# Patient Record
Sex: Female | Born: 1965 | Race: White | Hispanic: No | Marital: Married | Smoking: Never smoker
Health system: Southern US, Community
[De-identification: ages and names within clinical notes are randomized; demographics above are authoritative.]

## PROBLEM LIST (undated history)

## (undated) DIAGNOSIS — M7501 Adhesive capsulitis of right shoulder: Secondary | ICD-10-CM

## (undated) DIAGNOSIS — T7840XA Allergy, unspecified, initial encounter: Secondary | ICD-10-CM

## (undated) DIAGNOSIS — Z973 Presence of spectacles and contact lenses: Secondary | ICD-10-CM

## (undated) DIAGNOSIS — J302 Other seasonal allergic rhinitis: Secondary | ICD-10-CM

## (undated) DIAGNOSIS — F419 Anxiety disorder, unspecified: Secondary | ICD-10-CM

## (undated) DIAGNOSIS — D649 Anemia, unspecified: Secondary | ICD-10-CM

## (undated) DIAGNOSIS — F329 Major depressive disorder, single episode, unspecified: Secondary | ICD-10-CM

## (undated) HISTORY — PX: BREAST BIOPSY: SHX20

## (undated) HISTORY — PX: TUBAL LIGATION: SHX77

## (undated) HISTORY — DX: Allergy, unspecified, initial encounter: T78.40XA

## (undated) HISTORY — PX: BACK SURGERY: SHX140

---

## 2001-09-06 ENCOUNTER — Other Ambulatory Visit: Admission: RE | Admit: 2001-09-06 | Discharge: 2001-09-06 | Payer: Self-pay | Admitting: Obstetrics and Gynecology

## 2001-10-31 ENCOUNTER — Inpatient Hospital Stay (HOSPITAL_COMMUNITY): Admission: AD | Admit: 2001-10-31 | Discharge: 2001-10-31 | Payer: Self-pay | Admitting: Obstetrics and Gynecology

## 2002-02-01 ENCOUNTER — Inpatient Hospital Stay (HOSPITAL_COMMUNITY): Admission: AD | Admit: 2002-02-01 | Discharge: 2002-02-01 | Payer: Self-pay | Admitting: Obstetrics and Gynecology

## 2002-02-07 ENCOUNTER — Inpatient Hospital Stay (HOSPITAL_COMMUNITY): Admission: AD | Admit: 2002-02-07 | Discharge: 2002-02-07 | Payer: Self-pay | Admitting: Obstetrics and Gynecology

## 2002-02-13 ENCOUNTER — Inpatient Hospital Stay (HOSPITAL_COMMUNITY): Admission: AD | Admit: 2002-02-13 | Discharge: 2002-02-13 | Payer: Self-pay | Admitting: *Deleted

## 2002-02-27 ENCOUNTER — Encounter (INDEPENDENT_AMBULATORY_CARE_PROVIDER_SITE_OTHER): Payer: Self-pay

## 2002-02-27 ENCOUNTER — Inpatient Hospital Stay (HOSPITAL_COMMUNITY): Admission: AD | Admit: 2002-02-27 | Discharge: 2002-03-04 | Payer: Self-pay | Admitting: *Deleted

## 2002-03-05 ENCOUNTER — Encounter: Admission: RE | Admit: 2002-03-05 | Discharge: 2002-04-04 | Payer: Self-pay | Admitting: *Deleted

## 2002-04-05 ENCOUNTER — Encounter: Admission: RE | Admit: 2002-04-05 | Discharge: 2002-05-05 | Payer: Self-pay | Admitting: *Deleted

## 2002-04-24 ENCOUNTER — Other Ambulatory Visit: Admission: RE | Admit: 2002-04-24 | Discharge: 2002-04-24 | Payer: Self-pay | Admitting: Obstetrics and Gynecology

## 2002-05-06 ENCOUNTER — Encounter: Admission: RE | Admit: 2002-05-06 | Discharge: 2002-06-05 | Payer: Self-pay | Admitting: *Deleted

## 2003-05-27 ENCOUNTER — Other Ambulatory Visit: Admission: RE | Admit: 2003-05-27 | Discharge: 2003-05-27 | Payer: Self-pay | Admitting: Obstetrics and Gynecology

## 2004-07-31 ENCOUNTER — Other Ambulatory Visit: Admission: RE | Admit: 2004-07-31 | Discharge: 2004-07-31 | Payer: Self-pay | Admitting: Obstetrics and Gynecology

## 2005-10-04 ENCOUNTER — Other Ambulatory Visit: Admission: RE | Admit: 2005-10-04 | Discharge: 2005-10-04 | Payer: Self-pay | Admitting: Obstetrics and Gynecology

## 2006-12-21 ENCOUNTER — Encounter: Admission: RE | Admit: 2006-12-21 | Discharge: 2006-12-21 | Payer: Self-pay | Admitting: Obstetrics and Gynecology

## 2007-01-03 ENCOUNTER — Encounter: Admission: RE | Admit: 2007-01-03 | Discharge: 2007-01-03 | Payer: Self-pay | Admitting: Obstetrics and Gynecology

## 2007-01-05 ENCOUNTER — Encounter: Admission: RE | Admit: 2007-01-05 | Discharge: 2007-01-05 | Payer: Self-pay | Admitting: Obstetrics and Gynecology

## 2007-01-13 ENCOUNTER — Encounter: Admission: RE | Admit: 2007-01-13 | Discharge: 2007-01-13 | Payer: Self-pay | Admitting: Obstetrics and Gynecology

## 2007-01-13 ENCOUNTER — Encounter (INDEPENDENT_AMBULATORY_CARE_PROVIDER_SITE_OTHER): Payer: Self-pay | Admitting: Diagnostic Radiology

## 2007-08-01 ENCOUNTER — Encounter: Admission: RE | Admit: 2007-08-01 | Discharge: 2007-08-01 | Payer: Self-pay | Admitting: Obstetrics and Gynecology

## 2008-08-30 HISTORY — PX: BREAST BIOPSY: SHX20

## 2010-09-20 ENCOUNTER — Encounter: Payer: Self-pay | Admitting: Obstetrics and Gynecology

## 2011-01-15 NOTE — Op Note (Signed)
[facility]  Patient:    Lori King, Lori King Visit Number: [id number] MRN: [id number]          Service Type: OBS Location: 910A 9103 01 Attending Physician:  Donne Hazel Dictated by:   Marcelle Overlie, M.D. Proc. Date: 02/28/02 Admit Date:  02/27/2002                             Operative Report  PREOPERATIVE DIAGNOSES:      1. Intrauterine pregnancy at 34-5/7 weeks.                               2. Preterm labor.                               3. Previous cesarean section.                               4. Possible uterine scar separation.                               5. Desires permanent sterilization.  POSTOPERATIVE DIAGNOSES:      1. Intrauterine pregnancy at 34-5/7 weeks.                               2. Preterm labor.                               3. Previous cesarean section.                               4. Possible uterine scar separation.                               5. Desires permanent sterilization.  PROCEDURES:                   1. Repeat cesarean section.                               2. Bilateral tubal ligation.  SURGEON:                      Marcelle Overlie, M.D.  ANESTHESIA:                   Spinal.  ESTIMATED BLOOD LOSS:         500 cc.  FINDINGS:                     Female infant in cephalic presentation.  Apgars 7 at one minute and 8 at five minutes.  DESCRIPTION OF PROCEDURE:     The patient was taken to the operating room. Spinal was placed and found to be adequate.  She was placed in the dorsal supine position with a leftward tilt.  The abdomen and vulva were prepped and draped in the usual sterile fashion after a Foley catheter was used  and inserted.  Using a scalpel, a low transverse incision was made in the area of the previous cesarean section and carried down to the fascia.  The fascia was scored in the midline and extended laterally.  A Pfannenstiel incision was then developed.  The rectus muscles  were separated.  The peritoneum was entered bluntly.  The peritoneal incision was then stretched.  A bladder blade was inserted and the lower uterine segment was identified.  The lower uterine segment was very attenuated and very thin, but there was no obvious separation seen.  The uterine scar was easily pierced using a hemostat.  The uterus was then separated.  The incision was then separated.  Amniotic fluid was noted to be clear.  The baby was in cephalic presentation and was delivered with the assistance of a vacuum extractor.  She was a female infant with Apgars of 7 at one minute and 8 at five minutes.  The baby was handed to the waiting neonatologist after the cord was clamped and cut.  Cord blood was obtained. The uterus was then cleared of all clots and debris after the placenta was manually removed.  The uterus was then exteriorized.  The uterine incision was closed in a single layer using 0 chromic in a continuous running lock stitch and was hemostatic.  Attention was then turned to the tubes.  The bilateral fimbriated ends were identified.  A modified bilateral tubal ligation was performed in the following fashion.  A Babcock clamp was placed in the midportion of each tube.  A 3 cm knuckle was tied off using plain gut suture x2.  The knuckle was excised using Metzenbaum scissors.  The incisions were all inspected and noted to be hemostatic.  The peritoneum was closed using 0 Vicryl in continuous running stitch.  The rectus muscles were reapproximated using a single 0 Vicryl.  The fascia was closed using 0 Vicryl in continuous running stitch starting at each corner and meeting in the midline.  After inspection of the subcutaneous layer and noting hemostasis, the skin was closed with staples.  All sponge, lap and instrument counts were correct x2. The patient tolerated the procedure well and went to the recovery room in stable condition. Dictated by:   Marcelle Overlie,  M.D. Attending Physician:  Donne Hazel DD:  02/28/02 TD:  03/04/02 Job: [id number]

## 2011-01-15 NOTE — Discharge Summary (Signed)
NAME:  Lori King, KNAPKE                       ACCOUNT NO.:  [id number]   MEDICAL RECORD NO.:  [id number]                   PATIENT TYPE:  INP   LOCATION:  9103                                 FACILITY:  [facility]   PHYSICIAN:  Juluis Mire, M.D.                DATE OF BIRTH:  07-31-1966   DATE OF ADMISSION:  02/27/2002  DATE OF DISCHARGE:  03/04/2002                                 DISCHARGE SUMMARY   ADMISSION DIAGNOSES:  1. Intrauterine pregnancy at 34-5/7 weeks.  2. Preterm labor.  3. Previous cesarean delivery.  4. Possible uterine scar separation.  5. Desires permanent sterilization.   DISCHARGE DIAGNOSES:  1. Status post low transverse cesarean section.  2. Viable female infant.  3. Permanent sterilization.   PROCEDURE:  1. Repeat low transverse cesarean section.  2. Bilateral tubal ligation.   REASON FOR ADMISSION:  Please see written H&P.   HOSPITAL COURSE:  The patient was admitted to [facility] at  34-4/7 weeks, estimated gestational age, for tocolysis for preterm labor.  The patient had previously been treated with oral terbutaline.  The patient  presented to the hospital with increased contractions, low back pain, and  pressure.  The patient was admitted for magnesium sulfate, tocolysis, and  betamethasone.  The patient had history of positive group B strep.  Antibiotics were administered.  Contractions continued despite magnesium  sulfate at 3.5 g/hr.  The patient was very uncomfortable and concern was  that patient had developed a separation of a previous cesarean scar.  It was  decided to proceed to the operating room for a repeat low transverse  cesarean delivery.  Spinal anesthesia was administered without difficulty.  A low transverse incision was made with the delivery of a viable female  infant, weighing 5 pounds 8.2 ounces with Apgars of 7 at one minute and 8 at  five minutes.  The baby was taken to the NICU by the pediatric team.  Bilateral tubal ligation was performed.  The patient tolerated the procedure  well and was taken to the recovery room in stable condition.  On  postoperative day one, the patient had good return of bowel function.  Foley  had been discontinued and patient was voiding without difficulty.  Abdominal  dressing was noted to be clean, dry, and intact.  Labs were pending.  On  postoperative day two, abdomen was soft.  Incision was clean, dry, and  intact.  The patient was tolerating a regular diet without complaints of  nausea and vomiting.  Labs revealed a hemoglobin of 9.4, hematocrit of 38.2,  WBC count of 14.3, and platelet count of 265,000.  On postoperative day  three, abdomen continued to be soft.  The patient was ambulating without  difficulty.  On postoperative day four, patient was doing well.  Incision  was clean, dry, and intact.  Staples were intact.  The patient was  discharged home.  CONDITION ON DISCHARGE:  Good.   DIET:  Regular as tolerated.   ACTIVITY:  No heavy lifting, no vaginal entry, no driving x 2 weeks.   FOLLOW UP:  The patient is to follow up in the office in three days for  staple removal.  Then she is to return to the office in 1-2 weeks for an  incision check.  She is to call for temperature greater than 100 degrees,  persistent nausea and vomiting, heavy vaginal bleeding, and/or redness or  drainage of the incision site.   DISCHARGE MEDICATIONS:  1. Tylox #30, 1-2 q.4-6h. p.r.n. pain.  2. Motrin 600 mg q.6h. p.r.n.  3. Chromagen 1 p.o. q.d.  4. Prenatal vitamins 1 p.o. q.d.  5. Colace 1 p.o. q.d. p.r.n.     Judith Blonder, N.P.                     Juluis Mire, M.D.    CGC/MEDQ  D:  03/30/2002  T:  04/05/2002  Job:  [id number]

## 2011-08-31 HISTORY — PX: LUMBAR LAMINECTOMY/DECOMPRESSION MICRODISCECTOMY: SHX5026

## 2012-07-04 ENCOUNTER — Other Ambulatory Visit: Payer: Self-pay | Admitting: Obstetrics and Gynecology

## 2012-07-04 DIAGNOSIS — Z803 Family history of malignant neoplasm of breast: Secondary | ICD-10-CM

## 2012-07-04 DIAGNOSIS — Z1231 Encounter for screening mammogram for malignant neoplasm of breast: Secondary | ICD-10-CM

## 2012-07-06 ENCOUNTER — Ambulatory Visit
Admission: RE | Admit: 2012-07-06 | Discharge: 2012-07-06 | Disposition: A | Discharge status: Home / Self Care | Payer: BC Managed Care – PPO | Source: Ambulatory Visit | Attending: Obstetrics and Gynecology | Admitting: Obstetrics and Gynecology

## 2012-07-06 DIAGNOSIS — Z803 Family history of malignant neoplasm of breast: Secondary | ICD-10-CM

## 2012-07-06 DIAGNOSIS — Z1231 Encounter for screening mammogram for malignant neoplasm of breast: Secondary | ICD-10-CM

## 2013-03-11 ENCOUNTER — Ambulatory Visit (HOSPITAL_BASED_OUTPATIENT_CLINIC_OR_DEPARTMENT_OTHER)
Admit: 2013-03-11 | Discharge: 2013-03-11 | Disposition: A | Discharge status: Home / Self Care | Payer: BC Managed Care – PPO | Attending: Emergency Medicine | Admitting: Emergency Medicine

## 2013-03-11 ENCOUNTER — Encounter (HOSPITAL_BASED_OUTPATIENT_CLINIC_OR_DEPARTMENT_OTHER): Payer: Self-pay

## 2013-03-11 ENCOUNTER — Emergency Department (HOSPITAL_BASED_OUTPATIENT_CLINIC_OR_DEPARTMENT_OTHER)
Admission: EM | Admit: 2013-03-11 | Discharge: 2013-03-11 | Disposition: A | Discharge status: Home / Self Care | Payer: BC Managed Care – PPO | Attending: Emergency Medicine | Admitting: Emergency Medicine

## 2013-03-11 ENCOUNTER — Inpatient Hospital Stay (HOSPITAL_BASED_OUTPATIENT_CLINIC_OR_DEPARTMENT_OTHER): Admit: 2013-03-11 | Payer: BC Managed Care – PPO

## 2013-03-11 ENCOUNTER — Ambulatory Visit (HOSPITAL_BASED_OUTPATIENT_CLINIC_OR_DEPARTMENT_OTHER)
Admission: RE | Admit: 2013-03-11 | Discharge: 2013-03-11 | Disposition: A | Discharge status: Home / Self Care | Payer: BC Managed Care – PPO | Source: Ambulatory Visit | Attending: Physician Assistant | Admitting: Physician Assistant

## 2013-03-11 DIAGNOSIS — M79605 Pain in left leg: Secondary | ICD-10-CM

## 2013-03-11 DIAGNOSIS — M79609 Pain in unspecified limb: Secondary | ICD-10-CM | POA: Insufficient documentation

## 2013-03-11 DIAGNOSIS — Z7982 Long term (current) use of aspirin: Secondary | ICD-10-CM | POA: Insufficient documentation

## 2013-03-11 LAB — D-DIMER, QUANTITATIVE: D-Dimer, Quant: <0.27 ug/mL-FEU (ref 0.00–0.48)

## 2013-03-11 NOTE — ED Notes (Signed)
MD with pt  

## 2013-03-11 NOTE — ED Notes (Signed)
Patient here with throbbing to left calf since Friday. No redness or swelling noted. Pain started after 13 hour car on Thursday

## 2013-03-11 NOTE — ED Provider Notes (Signed)
   History    CSN: [id number] Arrival date & time 03/11/13  0148  First MD Initiated Contact with Patient 03/11/13 0251     Chief Complaint  Patient presents with  . Leg Pain   (Consider location/radiation/quality/duration/timing/severity/associated sxs/prior Treatment) HPI This is a 47 year old female who drove from [place] 3 days ago. The drive took about 13 hours and she alternated driving with her mother. The next day she developed a mild, well localized, aching pain in her left lateral calf. The pain radiates to the posterior calf. There is little change with palpation or movement. There is no associated swelling, erythema or warmth. She denies chest pain or shortness of breath.  History reviewed. No pertinent past medical history. Past Surgical History  Procedure Laterality Date  . Back surgery     No family history on file. History  Substance Use Topics  . Smoking status: Never Smoker   . Smokeless tobacco: Not on file  . Alcohol Use: Not on file   OB History   Grav Para Term Preterm Abortions TAB SAB Ect Mult Living                 Review of Systems  All other systems reviewed and are negative.    Allergies  Ciprofloxacin and Penicillins  Home Medications   Current Outpatient Rx  Name  Route  Sig  Dispense  Refill  . aspirin 325 MG EC tablet   Oral   Take 325 mg by mouth 2 times daily at 12 noon and 4 pm.         . buPROPion (WELLBUTRIN XL) 300 MG 24 hr tablet   Oral   Take 300 mg by mouth daily.          BP 122/63  Pulse 78  Temp(Src) 98 F (36.7 C) (Oral)  Resp 18  SpO2 98%  Physical Exam General: Well-developed, well-nourished female in no acute distress; appearance consistent with age of record HENT: normocephalic, atraumatic Eyes: pupils equal round and reactive to light; extraocular muscles intact Neck: supple Heart: regular rate and rhythm; no murmurs, rubs or gallops Lungs: clear to auscultation bilaterally Abdomen: soft;  nondistended; nontender; no masses or hepatosplenomegaly; bowel sounds present Extremities: No deformity; full range of motion; pulses normal; no calf tenderness; no calf swelling; no palpable cords; negative Homan's sign Neurologic: Awake, alert and oriented; motor function intact in all extremities and symmetric; no facial droop Skin: Warm and dry Psychiatric: Normal mood and affect    ED Course  Procedures (including critical care time)   MDM   Nursing notes and vitals signs, including pulse oximetry, reviewed.  Summary of this visit's results, reviewed by myself:  Labs:  Results for orders placed during the hospital encounter of 03/11/13 (from the past 24 hour(s))  D-DIMER, QUANTITATIVE     Status: None   Collection Time    03/11/13  2:30 AM      Result Value Range   D-Dimer, Quant <0.27  0.00 - 0.48 ug/mL-FEU   3:01 AM The d-dimer is reassuring and we will hold Lovenox at this time. Because of her history we will obtain a Doppler ultrasound later this morning.   Hanley Seamen, MD 03/11/13 [phone]

## 2013-03-11 NOTE — ED Notes (Signed)
Allie from radiology at bedside to discuss u/s of leg with pt.

## 2013-10-03 ENCOUNTER — Other Ambulatory Visit: Payer: Self-pay

## 2013-10-03 DIAGNOSIS — Z1231 Encounter for screening mammogram for malignant neoplasm of breast: Secondary | ICD-10-CM

## 2013-10-17 ENCOUNTER — Ambulatory Visit
Admission: RE | Admit: 2013-10-17 | Discharge: 2013-10-17 | Disposition: A | Discharge status: Home / Self Care | Payer: BC Managed Care – PPO | Source: Ambulatory Visit

## 2013-10-17 DIAGNOSIS — Z1231 Encounter for screening mammogram for malignant neoplasm of breast: Secondary | ICD-10-CM

## 2013-11-06 ENCOUNTER — Other Ambulatory Visit: Payer: Self-pay | Admitting: Obstetrics and Gynecology

## 2013-11-06 DIAGNOSIS — Z803 Family history of malignant neoplasm of breast: Secondary | ICD-10-CM

## 2013-11-06 DIAGNOSIS — R9230 Dense breasts, unspecified: Secondary | ICD-10-CM

## 2013-11-06 DIAGNOSIS — R922 Inconclusive mammogram: Secondary | ICD-10-CM

## 2013-12-21 ENCOUNTER — Ambulatory Visit
Admission: RE | Admit: 2013-12-21 | Discharge: 2013-12-21 | Disposition: A | Discharge status: Home / Self Care | Payer: BC Managed Care – PPO | Source: Ambulatory Visit | Attending: Obstetrics and Gynecology | Admitting: Obstetrics and Gynecology

## 2013-12-21 DIAGNOSIS — Z803 Family history of malignant neoplasm of breast: Secondary | ICD-10-CM

## 2013-12-21 DIAGNOSIS — R922 Inconclusive mammogram: Secondary | ICD-10-CM

## 2013-12-21 MED ORDER — GADOBENATE DIMEGLUMINE 529 MG/ML IV SOLN
12.0000 mL | Freq: Once | INTRAVENOUS | Status: AC | PRN
  Administered 2013-12-21: 12 mL via INTRAVENOUS

## 2014-08-06 DIAGNOSIS — F339 Major depressive disorder, recurrent, unspecified: Secondary | ICD-10-CM | POA: Insufficient documentation

## 2014-09-24 ENCOUNTER — Other Ambulatory Visit: Payer: Self-pay

## 2014-09-24 DIAGNOSIS — Z1231 Encounter for screening mammogram for malignant neoplasm of breast: Secondary | ICD-10-CM

## 2014-10-21 ENCOUNTER — Ambulatory Visit
Admission: RE | Admit: 2014-10-21 | Discharge: 2014-10-21 | Disposition: A | Discharge status: Home / Self Care | Payer: BLUE CROSS/BLUE SHIELD | Source: Ambulatory Visit

## 2014-10-21 DIAGNOSIS — Z1231 Encounter for screening mammogram for malignant neoplasm of breast: Secondary | ICD-10-CM

## 2014-12-24 ENCOUNTER — Other Ambulatory Visit: Payer: Self-pay

## 2014-12-25 LAB — CYTOLOGY - PAP

## 2015-10-14 ENCOUNTER — Other Ambulatory Visit: Payer: Self-pay

## 2015-10-14 DIAGNOSIS — Z1231 Encounter for screening mammogram for malignant neoplasm of breast: Secondary | ICD-10-CM

## 2015-10-31 ENCOUNTER — Ambulatory Visit
Admission: RE | Admit: 2015-10-31 | Discharge: 2015-10-31 | Disposition: A | Discharge status: Home / Self Care | Payer: BLUE CROSS/BLUE SHIELD | Source: Ambulatory Visit

## 2015-10-31 DIAGNOSIS — Z1231 Encounter for screening mammogram for malignant neoplasm of breast: Secondary | ICD-10-CM

## 2015-11-03 ENCOUNTER — Other Ambulatory Visit: Payer: Self-pay | Admitting: Obstetrics and Gynecology

## 2015-11-03 DIAGNOSIS — R928 Other abnormal and inconclusive findings on diagnostic imaging of breast: Secondary | ICD-10-CM

## 2015-11-07 ENCOUNTER — Ambulatory Visit
Admission: RE | Admit: 2015-11-07 | Discharge: 2015-11-07 | Disposition: A | Discharge status: Home / Self Care | Payer: BLUE CROSS/BLUE SHIELD | Source: Ambulatory Visit | Attending: Obstetrics and Gynecology | Admitting: Obstetrics and Gynecology

## 2015-11-07 DIAGNOSIS — R928 Other abnormal and inconclusive findings on diagnostic imaging of breast: Secondary | ICD-10-CM

## 2016-08-18 DIAGNOSIS — J302 Other seasonal allergic rhinitis: Secondary | ICD-10-CM | POA: Insufficient documentation

## 2016-09-28 ENCOUNTER — Other Ambulatory Visit: Payer: Self-pay | Admitting: Family Medicine

## 2016-09-28 DIAGNOSIS — Z1231 Encounter for screening mammogram for malignant neoplasm of breast: Secondary | ICD-10-CM

## 2016-11-01 ENCOUNTER — Ambulatory Visit
Admission: RE | Admit: 2016-11-01 | Discharge: 2016-11-01 | Disposition: A | Discharge status: Home / Self Care | Payer: BLUE CROSS/BLUE SHIELD | Source: Ambulatory Visit | Attending: Family Medicine | Admitting: Family Medicine

## 2016-11-01 DIAGNOSIS — Z1231 Encounter for screening mammogram for malignant neoplasm of breast: Secondary | ICD-10-CM

## 2016-11-02 ENCOUNTER — Other Ambulatory Visit: Payer: Self-pay | Admitting: Family Medicine

## 2016-11-02 DIAGNOSIS — R928 Other abnormal and inconclusive findings on diagnostic imaging of breast: Secondary | ICD-10-CM

## 2016-11-03 ENCOUNTER — Other Ambulatory Visit: Payer: Self-pay | Admitting: Obstetrics and Gynecology

## 2016-11-03 DIAGNOSIS — R928 Other abnormal and inconclusive findings on diagnostic imaging of breast: Secondary | ICD-10-CM

## 2016-11-04 ENCOUNTER — Ambulatory Visit
Admission: RE | Admit: 2016-11-04 | Discharge: 2016-11-04 | Disposition: A | Discharge status: Home / Self Care | Payer: BLUE CROSS/BLUE SHIELD | Source: Ambulatory Visit | Attending: Family Medicine | Admitting: Family Medicine

## 2016-11-04 DIAGNOSIS — R928 Other abnormal and inconclusive findings on diagnostic imaging of breast: Secondary | ICD-10-CM

## 2017-03-20 ENCOUNTER — Encounter (HOSPITAL_BASED_OUTPATIENT_CLINIC_OR_DEPARTMENT_OTHER): Payer: Self-pay | Admitting: Emergency Medicine

## 2017-03-20 ENCOUNTER — Emergency Department (HOSPITAL_BASED_OUTPATIENT_CLINIC_OR_DEPARTMENT_OTHER)
Admission: EM | Admit: 2017-03-20 | Discharge: 2017-03-21 | Disposition: A | Discharge status: Home / Self Care | Payer: BLUE CROSS/BLUE SHIELD | Attending: Emergency Medicine | Admitting: Emergency Medicine

## 2017-03-20 DIAGNOSIS — Z79899 Other long term (current) drug therapy: Secondary | ICD-10-CM | POA: Insufficient documentation

## 2017-03-20 DIAGNOSIS — T781XXA Other adverse food reactions, not elsewhere classified, initial encounter: Secondary | ICD-10-CM | POA: Diagnosis not present

## 2017-03-20 DIAGNOSIS — T7840XA Allergy, unspecified, initial encounter: Secondary | ICD-10-CM

## 2017-03-20 DIAGNOSIS — R0602 Shortness of breath: Secondary | ICD-10-CM | POA: Diagnosis present

## 2017-03-20 DIAGNOSIS — Z7982 Long term (current) use of aspirin: Secondary | ICD-10-CM | POA: Diagnosis not present

## 2017-03-20 MED ORDER — EPINEPHRINE 0.3 MG/0.3ML IJ SOAJ: 0.3000 mg | Freq: Once | INTRAMUSCULAR | 5 refills | Status: DC | PRN

## 2017-03-20 MED ORDER — METHYLPREDNISOLONE SODIUM SUCC 125 MG IJ SOLR
125.0000 mg | Freq: Once | INTRAMUSCULAR | Status: AC
  Administered 2017-03-20: 125 mg via INTRAVENOUS
  Filled 2017-03-20: qty 2

## 2017-03-20 MED ORDER — SODIUM CHLORIDE 0.9 % IV BOLUS (SEPSIS)
1000.0000 mL | Freq: Once | INTRAVENOUS | Status: AC
  Administered 2017-03-20: 1000 mL via INTRAVENOUS

## 2017-03-20 MED ORDER — PREDNISONE 20 MG PO TABS: ORAL_TABLET | ORAL | 0 refills | Status: DC

## 2017-03-20 MED ORDER — FAMOTIDINE IN NACL 20-0.9 MG/50ML-% IV SOLN
20.0000 mg | INTRAVENOUS | Status: AC
  Administered 2017-03-20: 20 mg via INTRAVENOUS
  Filled 2017-03-20: qty 50

## 2017-03-20 MED ORDER — DIPHENHYDRAMINE HCL 50 MG/ML IJ SOLN
25.0000 mg | Freq: Once | INTRAMUSCULAR | Status: AC
  Administered 2017-03-20: 25 mg via INTRAVENOUS
  Filled 2017-03-20: qty 1

## 2017-03-20 NOTE — ED Provider Notes (Signed)
[place] [facility] Provider Note   CSN: [id number] Arrival date & time: 03/20/17  2004  By signing my name below, I, Mayer Masker, attest that this documentation has been prepared under the direction and in the presence of Quincy Carnes, PA-C. Electronically Signed: Mayer Masker, Scribe. 03/20/17. 8:30 PM.  History   Chief Complaint Chief Complaint  Patient presents with  . Allergic Reaction   The history is provided by the patient. No language interpreter was used.    HPI Comments: Lori King is a 51 y.o. female who presents to the Emergency Department complaining of constant, rapid-onset swelling of her throat s/p eating anchovies on a pizza prior to arrival. She has associated itchiness of her eyes, increased effort while breathing, nausea, tingling of fingers, and swelling to her mouth and tongue. Pt used an Epipen and took 1 benadryl when her symptoms began with some relief. She denies rash. Pt has developed recent allergies to seafood.  No other known allergies.  History reviewed. No pertinent past medical history.  There are no active problems to display for this patient.   Past Surgical History:  Procedure Laterality Date  . BACK SURGERY      OB History    No data available       Home Medications    Prior to Admission medications   Medication Sig Start Date End Date Taking? Authorizing Provider  aspirin 325 MG EC tablet Take 325 mg by mouth 2 times daily at 12 noon and 4 pm.    [provider]  buPROPion (WELLBUTRIN XL) 300 MG 24 hr tablet Take 300 mg by mouth daily.    [provider]    Family History Family History  Problem Relation Age of Onset  . Breast cancer Mother   . Breast cancer Sister     Social History Social History  Substance Use Topics  . Smoking status: Never Smoker  . Smokeless tobacco: Not on file  . Alcohol use Not on file     Allergies   Shellfish allergy; Other; Ciprofloxacin; and  Penicillins   Review of Systems Review of Systems  HENT: Positive for facial swelling and trouble swallowing.        Mouth/tongue swelling  Eyes: Positive for itching.  Respiratory:       Increased effort while breathing  Gastrointestinal: Positive for nausea.  Skin: Negative for rash.  All other systems reviewed and are negative.    Physical Exam Updated Vital Signs BP 131/84 (BP Location: Right Arm)   Pulse 86   Temp 98.3 F (36.8 C) (Oral)   Resp 20   Ht 5' 2.5" (1.588 m)   Wt 134 lb (60.8 kg)   LMP 02/24/2017   SpO2 99%   BMI 24.12 kg/m   Physical Exam  Constitutional: She is oriented to person, place, and time. She appears well-developed and well-nourished.  HENT:  Head: Normocephalic and atraumatic.  Mouth/Throat: Oropharynx is clear and moist.  Airway patent, no significant swelling of the oral mucosa, tongue, or lips, floor of mouth appears normal, no oral lesions; handling secretions well, normal phonation without stridor  Eyes: Pupils are equal, round, and reactive to light. Conjunctivae and EOM are normal.  Neck: Normal range of motion.  Cardiovascular: Normal rate, regular rhythm and normal heart sounds.   Pulmonary/Chest: Effort normal and breath sounds normal.  Abdominal: Soft. Bowel sounds are normal.  Musculoskeletal: Normal range of motion.  Neurological: She is alert and oriented to person, place, and  time.  Skin: Skin is warm and dry. No rash noted.  No hives or other rashes  Psychiatric: She has a normal mood and affect.  Nursing note and vitals reviewed.    ED Treatments / Results  DIAGNOSTIC STUDIES: Oxygen Saturation is 99% on RA, normal by my interpretation.    COORDINATION OF CARE: 8:30 PM Discussed treatment plan with pt at bedside and pt agreed to plan.  Labs (all labs ordered are listed, but only abnormal results are displayed) Labs Reviewed - No data to display  EKG  EKG Interpretation None       Radiology No results  found.  Procedures Procedures (including critical care time)  Medications Ordered in ED Medications - No data to display   Initial Impression / Assessment and Plan / ED Course  I have reviewed the triage vital signs and the nursing notes.  Pertinent labs & imaging results that were available during my care of the patient were reviewed by me and considered in my medical decision making (see chart for details).  51 year old female here with allergic reaction to anchovies. She has a known shellfish allergy. Ate pizza without realizing intravenous for in it. Reports she had shortness of breath and sensation of throat swelling so used EpiPen prior to arrival. On arrival, reports her tongue still feels "thick".  Handling secretions well, normal phonation, without stridor.  No rashes.  VSS.  Will give additional IV benadryl, solu-medrol, pepcid.  Will monitor closely.  9:28 PM Patient reassessed. States her throat is feeling better. She's been drinking water without difficulty. Airway remains patent. Will continue to monitor closely.  1200AM-- Patient remained stable here. States her throat feels back to normal. She is continue drinking fluids without issue. Vitals remained stable. She's been monitored for a total of 4 hours without rebound reaction. She is stable for discharge. Will discharge home on prednisone taper, continue Benadryl as needed. Refilled her EpiPen, she understands indications for use and need for ER evaluation after. Will have her follow-up closely with her primary care doctor.  Discussed plan with patient, she acknowledged understanding and agreed with plan of care.  Return precautions given for new or worsening symptoms.  Final Clinical Impressions(s) / ED Diagnoses   Final diagnoses:  Allergic reaction, initial encounter    New Prescriptions New Prescriptions   EPINEPHRINE (EPIPEN 2-PAK) 0.3 MG/0.3 ML IJ SOAJ INJECTION    Inject 0.3 mLs (0.3 mg total) into the muscle once  as needed (for severe allergic reaction). CAll 911 immediately if you have to use this medicine   PREDNISONE (DELTASONE) 20 MG TABLET    Take 40 mg by mouth daily for 3 days, then 20mg  by mouth daily for 3 days, then 10mg  daily for 3 days   I personally performed the services described in this documentation, which was scribed in my presence. The recorded information has been reviewed and is accurate.   Larene Pickett, PA-C 03/21/17 0006    Davonna Belling, MD 03/21/17 [phone]

## 2017-03-20 NOTE — ED Notes (Signed)
Alert, NAD, calm, interactive, resps e/u, speaking in clear complete sentences, no dyspnea noted, LS CTA,  skin W&D, VSS, mentions "tongue feels fat, face and throat feel puffy" (denies at this time: pain, sob, nausea, dizziness or visual changes), Family at [facility].

## 2017-03-20 NOTE — Discharge Instructions (Signed)
Take the prescribed medication as directed.  Can continue benadryl for itching if needed. Follow-up with your primary care doctor. Return to the ED for new or worsening symptoms.

## 2017-03-20 NOTE — ED Triage Notes (Signed)
PT presents with c/o of allergic reaction after eating pizza and there was anchovies on it and she did not know it. Pt had trouble breathing and throat swelling. PT used epi pen and came here. PT not in distress on arrival to ED

## 2017-04-18 DIAGNOSIS — Z91018 Allergy to other foods: Secondary | ICD-10-CM | POA: Insufficient documentation

## 2017-08-30 HISTORY — PX: HYSTEROSCOPY W/ ENDOMETRIAL ABLATION: SUR665

## 2017-11-01 HISTORY — PX: COLONOSCOPY: SHX174

## 2017-11-08 ENCOUNTER — Other Ambulatory Visit: Payer: Self-pay | Admitting: Family Medicine

## 2017-11-08 DIAGNOSIS — Z1231 Encounter for screening mammogram for malignant neoplasm of breast: Secondary | ICD-10-CM

## 2017-12-06 ENCOUNTER — Ambulatory Visit
Admission: RE | Admit: 2017-12-06 | Discharge: 2017-12-06 | Disposition: A | Discharge status: Home / Self Care | Payer: BLUE CROSS/BLUE SHIELD | Source: Ambulatory Visit | Attending: Family Medicine | Admitting: Family Medicine

## 2017-12-06 DIAGNOSIS — Z1231 Encounter for screening mammogram for malignant neoplasm of breast: Secondary | ICD-10-CM

## 2018-04-07 ENCOUNTER — Other Ambulatory Visit: Payer: Self-pay

## 2018-04-07 ENCOUNTER — Encounter (HOSPITAL_BASED_OUTPATIENT_CLINIC_OR_DEPARTMENT_OTHER): Payer: Self-pay

## 2018-04-07 ENCOUNTER — Emergency Department (HOSPITAL_BASED_OUTPATIENT_CLINIC_OR_DEPARTMENT_OTHER): Payer: BLUE CROSS/BLUE SHIELD

## 2018-04-07 ENCOUNTER — Emergency Department (HOSPITAL_BASED_OUTPATIENT_CLINIC_OR_DEPARTMENT_OTHER)
Admission: EM | Admit: 2018-04-07 | Discharge: 2018-04-07 | Disposition: A | Discharge status: Home / Self Care | Payer: BLUE CROSS/BLUE SHIELD | Attending: Emergency Medicine | Admitting: Emergency Medicine

## 2018-04-07 DIAGNOSIS — S93402A Sprain of unspecified ligament of left ankle, initial encounter: Secondary | ICD-10-CM

## 2018-04-07 DIAGNOSIS — Y999 Unspecified external cause status: Secondary | ICD-10-CM | POA: Insufficient documentation

## 2018-04-07 DIAGNOSIS — Y9301 Activity, walking, marching and hiking: Secondary | ICD-10-CM | POA: Insufficient documentation

## 2018-04-07 DIAGNOSIS — Z79899 Other long term (current) drug therapy: Secondary | ICD-10-CM | POA: Insufficient documentation

## 2018-04-07 DIAGNOSIS — Y929 Unspecified place or not applicable: Secondary | ICD-10-CM | POA: Diagnosis not present

## 2018-04-07 DIAGNOSIS — S99912A Unspecified injury of left ankle, initial encounter: Secondary | ICD-10-CM | POA: Diagnosis present

## 2018-04-07 DIAGNOSIS — X500XXA Overexertion from strenuous movement or load, initial encounter: Secondary | ICD-10-CM | POA: Insufficient documentation

## 2018-04-07 DIAGNOSIS — Z7982 Long term (current) use of aspirin: Secondary | ICD-10-CM | POA: Diagnosis not present

## 2018-04-07 MED ORDER — OXYCODONE-ACETAMINOPHEN 5-325 MG PO TABS
1.0000 | ORAL_TABLET | Freq: Once | ORAL | Status: AC
  Administered 2018-04-07: 1 via ORAL
  Filled 2018-04-07: qty 1

## 2018-04-07 MED ORDER — HYDROCODONE-ACETAMINOPHEN 5-325 MG PO TABS: 1.0000 | ORAL_TABLET | ORAL | 0 refills | Status: DC | PRN

## 2018-04-07 MED ORDER — ONDANSETRON HCL 4 MG/2ML IJ SOLN
4.0000 mg | Freq: Once | INTRAMUSCULAR | Status: AC
  Administered 2018-04-07: 4 mg via INTRAVENOUS
  Filled 2018-04-07: qty 2

## 2018-04-07 MED ORDER — IBUPROFEN 800 MG PO TABS: 800.0000 mg | ORAL_TABLET | Freq: Three times a day (TID) | ORAL | 0 refills | Status: DC | PRN

## 2018-04-07 MED ORDER — HYDROMORPHONE HCL 1 MG/ML IJ SOLN
1.0000 mg | Freq: Once | INTRAMUSCULAR | Status: AC
  Administered 2018-04-07: 1 mg via INTRAVENOUS
  Filled 2018-04-07: qty 1

## 2018-04-07 MED ORDER — HYDROMORPHONE HCL 1 MG/ML IJ SOLN: 1.0000 mg | Freq: Once | INTRAMUSCULAR | Status: DC

## 2018-04-07 MED ORDER — ONDANSETRON 8 MG PO TBDP: 8.0000 mg | ORAL_TABLET | Freq: Once | ORAL | Status: DC

## 2018-04-07 NOTE — ED Triage Notes (Signed)
Pt reports mechanical fall downstairs, now has left ankle 10/10 pain and swelling. No deformity observed, CNS intact.

## 2018-04-07 NOTE — ED Provider Notes (Signed)
[facility] DEPARTMENT Provider Note   CSN: [id number] Arrival date & time: 04/07/18  0139     History   Chief Complaint Chief Complaint  Patient presents with  . Ankle Injury    Left    HPI Lori King is a 52 y.o. female.  Patient presents with complaints of severe left ankle pain after an injury.  She was walking downstairs and tripped, twisted her left ankle.  She did not hit her head or lose consciousness.  She is having pain in the ankle and foot with some pain behind the left knee.  No other injury noted.  Patient reports severe, 10 out of 10 pain.  She cannot bear weight.     History reviewed. No pertinent past medical history.  There are no active problems to display for this patient.   Past Surgical History:  Procedure Laterality Date  . BACK SURGERY    . CESAREAN SECTION    . TUBAL LIGATION       OB History   None      Home Medications    Prior to Admission medications   Medication Sig Start Date End Date Taking? Authorizing Provider  aspirin 325 MG EC tablet Take 325 mg by mouth 2 times daily at 12 noon and 4 pm.    [provider]  buPROPion (WELLBUTRIN XL) 300 MG 24 hr tablet Take 300 mg by mouth daily.    [provider]  EPINEPHrine (EPIPEN 2-PAK) 0.3 mg/0.3 mL IJ SOAJ injection Inject 0.3 mLs (0.3 mg total) into the muscle once as needed (for severe allergic reaction). CAll 911 immediately if you have to use this medicine 03/20/17   Larene Pickett, PA-C  HYDROcodone-acetaminophen (NORCO/VICODIN) 5-325 MG tablet Take 1-2 tablets by mouth every 4 (four) hours as needed for moderate pain. 04/07/18   Orpah Greek, MD  ibuprofen (ADVIL,MOTRIN) 800 MG tablet Take 1 tablet (800 mg total) by mouth every 8 (eight) hours as needed. 04/07/18   Orpah Greek, MD  predniSONE (DELTASONE) 20 MG tablet Take 40 mg by mouth daily for 3 days, then 20mg  by mouth daily for 3 days, then 10mg  daily for 3 days  03/20/17   Larene Pickett, PA-C    Family History Family History  Problem Relation Age of Onset  . Breast cancer Mother 71  . Breast cancer Sister 75    Social History Social History   Tobacco Use  . Smoking status: Never Smoker  Substance Use Topics  . Alcohol use: Not on file  . Drug use: Not on file     Allergies   Shellfish allergy; Other; Ciprofloxacin; and Penicillins   Review of Systems Review of Systems  Musculoskeletal: Positive for arthralgias.  All other systems reviewed and are negative.    Physical Exam Updated Vital Signs BP 121/85 (BP Location: Right Arm)   Pulse 89   Temp 97.8 F (36.6 C) (Oral)   Resp (!) 22   LMP 04/03/2018   SpO2 100%   Physical Exam  Constitutional: She is oriented to person, place, and time. She appears well-developed and well-nourished. No distress.  HENT:  Head: Normocephalic and atraumatic.  Right Ear: Hearing normal.  Left Ear: Hearing normal.  Nose: Nose normal.  Mouth/Throat: Oropharynx is clear and moist and mucous membranes are normal.  Eyes: Pupils are equal, round, and reactive to light. Conjunctivae and EOM are normal.  Neck: Normal range of motion. Neck supple.  Cardiovascular: Regular  rhythm, S1 normal and S2 normal. Exam reveals no gallop and no friction rub.  No murmur heard. Pulmonary/Chest: Effort normal and breath sounds normal. No respiratory distress. She exhibits no tenderness.  Abdominal: Soft. Normal appearance and bowel sounds are normal. There is no hepatosplenomegaly. There is no tenderness. There is no rebound, no guarding, no tenderness at McBurney's point and negative Murphy's sign. No hernia.  Musculoskeletal: Normal range of motion.       Left knee: She exhibits no swelling, no effusion, no ecchymosis, no deformity, no laceration, no erythema and normal alignment.       Left ankle: She exhibits swelling. She exhibits no ecchymosis, no deformity and no laceration. Tenderness. Lateral  malleolus tenderness found.       Left foot: There is tenderness (Lateral). There is no deformity.  Neurological: She is alert and oriented to person, place, and time. She has normal strength. No cranial nerve deficit or sensory deficit. Coordination normal. GCS eye subscore is 4. GCS verbal subscore is 5. GCS motor subscore is 6.  Skin: Skin is warm, dry and intact. No rash noted. No cyanosis.  Psychiatric: She has a normal mood and affect. Her speech is normal and behavior is normal. Thought content normal.  Nursing note and vitals reviewed.    ED Treatments / Results  Labs (all labs ordered are listed, but only abnormal results are displayed) Labs Reviewed - No data to display  EKG None  Radiology Dg Ankle Complete Left  Result Date: 04/07/2018 CLINICAL DATA:  Status post fall down steps, with acute onset of left ankle pain. Initial encounter. EXAM: LEFT ANKLE COMPLETE - 3+ VIEW COMPARISON:  None. FINDINGS: There is no evidence of fracture or dislocation. The ankle mortise is intact; the interosseous space is within normal limits. No talar tilt or subluxation is seen. The joint spaces are preserved. No significant soft tissue abnormalities are seen. IMPRESSION: No evidence of fracture or dislocation. Electronically Signed   By: Garald Balding M.D.   On: 04/07/2018 02:50   Dg Knee Complete 4 Views Left  Result Date: 04/07/2018 CLINICAL DATA:  Status post fall down steps, with acute onset of left knee pain. Initial encounter. EXAM: LEFT KNEE - COMPLETE 4+ VIEW COMPARISON:  None. FINDINGS: There is no evidence of fracture or dislocation. The joint spaces are preserved. No significant degenerative change is seen; the patellofemoral joint is grossly unremarkable in appearance. No significant joint effusion is seen. The visualized soft tissues are normal in appearance. IMPRESSION: No evidence of fracture or dislocation. Electronically Signed   By: Garald Balding M.D.   On: 04/07/2018 02:51   Dg  Foot Complete Left  Result Date: 04/07/2018 CLINICAL DATA:  Status post fall down steps, with acute onset of left foot pain. Initial encounter. EXAM: LEFT FOOT - COMPLETE 3+ VIEW COMPARISON:  None. FINDINGS: There is no evidence of fracture or dislocation. The joint spaces are preserved. There is no evidence of talar subluxation; the subtalar joint is unremarkable in appearance. No significant soft tissue abnormalities are seen. IMPRESSION: No evidence of fracture or dislocation. Electronically Signed   By: Garald Balding M.D.   On: 04/07/2018 02:50    Procedures Procedures (including critical care time)  Medications Ordered in ED Medications  oxyCODONE-acetaminophen (PERCOCET/ROXICET) 5-325 MG per tablet 1 tablet (has no administration in time range)  HYDROmorphone (DILAUDID) injection 1 mg (1 mg Intravenous Given 04/07/18 0204)  ondansetron (ZOFRAN) injection 4 mg (4 mg Intravenous Given 04/07/18 0205)  Initial Impression / Assessment and Plan / ED Course  I have reviewed the triage vital signs and the nursing notes.  Pertinent labs & imaging results that were available during my care of the patient were reviewed by me and considered in my medical decision making (see chart for details).     She presents with complaints of severe left foot and ankle pain after a fall.  No obvious deformity noted, some mild welling noted over the lateral aspect of the ankle.  X-ray knee, ankle, foot are negative.  Will be given crutches for nonweightbearing status for 24 hours and then transition to cam walker.  Based on the amount of pain she is experiencing, will refer to orthopedics for follow-up.  Final Clinical Impressions(s) / ED Diagnoses   Final diagnoses:  Sprain of left ankle, unspecified ligament, initial encounter    ED Discharge Orders         Ordered    HYDROcodone-acetaminophen (NORCO/VICODIN) 5-325 MG tablet  Every 4 hours PRN     04/07/18 0313    ibuprofen (ADVIL,MOTRIN) 800 MG  tablet  Every 8 hours PRN     04/07/18 0313           Orpah Greek, MD 04/07/18 [phone]

## 2018-04-07 NOTE — ED Notes (Signed)
Patient transported to X-ray 

## 2018-04-12 ENCOUNTER — Encounter: Payer: Self-pay | Admitting: Rehabilitative and Restorative Service Providers"

## 2018-04-12 ENCOUNTER — Ambulatory Visit: Payer: BLUE CROSS/BLUE SHIELD | Admitting: Rehabilitative and Restorative Service Providers"

## 2018-04-12 DIAGNOSIS — R2689 Other abnormalities of gait and mobility: Secondary | ICD-10-CM | POA: Diagnosis not present

## 2018-04-12 DIAGNOSIS — M25572 Pain in left ankle and joints of left foot: Secondary | ICD-10-CM

## 2018-04-12 DIAGNOSIS — R29898 Other symptoms and signs involving the musculoskeletal system: Secondary | ICD-10-CM | POA: Diagnosis not present

## 2018-04-12 NOTE — Therapy (Addendum)
[place], [place], [address] Phone: [phone]   Fax:  [phone]  Physical Therapy Evaluation  Patient Details  Name: Lori King MRN: [id number] Date of Birth: July 21, 1966 Referring Provider: Dr Rod Can   Encounter Date: 04/12/2018  PT End of Session - 04/12/18 0804    Visit Number  1    Number of Visits  12    Date for PT Re-Evaluation  05/24/18    PT Start Time  0802    PT Stop Time  0900    PT Time Calculation (min)  58 min    Activity Tolerance  Patient tolerated treatment well       History reviewed. No pertinent past medical history.  Past Surgical History:  Procedure Laterality Date  . BACK SURGERY    . CESAREAN SECTION    . TUBAL LIGATION      There were no vitals filed for this visit.   Subjective Assessment - 04/12/18 0814    Subjective  Patient reports falling down two steps 04/06/18 sustaining Lt ankle sprain. She was seen in urgent care and placed in a walking boot. She continues to have difficulty with standing and walking. Patient also reports tightness and discomfort in the Rt cervical/trap area which she feels is related to change in activity tolerance and positions for sleep following ankle sprain.     Pertinent History  back surgery 2014 lam/discetomy; 2 c-sectioins and tubal most recent 2003; Rt neck and shoudler pain in the month; plantar fasciitis Lt > 1 yr     Diagnostic tests  xrays     Patient Stated Goals  get ankle better and return to activites     Currently in Pain?  Yes    Pain Score  4     Pain Location  Ankle    Pain Orientation  Left    Pain Descriptors / Indicators  Aching    Pain Type  Acute pain    Pain Radiating Towards  front of shin     Pain Onset  1 to 4 weeks ago    Pain Frequency  Intermittent    Aggravating Factors   standing; walking; steps; lifting     Pain Relieving Factors  ice elvation meds          [facility] PT Assessment  - 04/12/18 0001      Assessment   Medical Diagnosis  Lt ankle sprain     Referring Provider  Dr Rod Can    Onset Date/Surgical Date  04/06/18    Hand Dominance  Right    Next MD Visit  8/19    Prior Therapy  none      Precautions   Precautions  None      Restrictions   Weight Bearing Restrictions  No      Balance Screen   Has the patient fallen in the past 6 months  Yes    How many times?  1    Has the patient had a decrease in activity level because of a fear of falling?   No    Is the patient reluctant to leave their home because of a fear of falling?   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  Leisure  household chores; rowing machine 2x/wk; walks dog daily 30 min 10,000 steps       Observation/Other Assessments   Focus on Therapeutic Outcomes (FOTO)   72% limitation       Observation/Other Assessments-Edema    Edema  --   minimal edema compared to Rt ankle      Sensation   Additional Comments  WFL's some intermittent tingling plantar surface       Posture/Postural Control   Posture Comments  weight shifted to the Rt in standing       AROM   Right Ankle Dorsiflexion  14    Right Ankle Plantar Flexion  62    Right Ankle Inversion  34    Right Ankle Eversion  25    Left Ankle Dorsiflexion  -2    Left Ankle Plantar Flexion  43   painful    Left Ankle Inversion  11    Left Ankle Eversion  8      Strength   Right Ankle Dorsiflexion  5/5    Right Ankle Plantar Flexion  5/5    Right Ankle Inversion  5/5    Right Ankle Eversion  5/5      Flexibility   Hamstrings  tight Lt > Rt     ITB  tight Lt > Rt     Piriformis  tight Lt > Rt       Palpation   Palpation comment  tender lateral Lt ankle - calcaneofibular ligament; talofibular ligament       Ambulation/Gait   Ambulation Distance (Feet)  40 Feet    Assistive  device  None    Gait Pattern  --   limp Lt LE w/ decreased wt bearing and wt shift    Ambulation Surface  Level;Indoor    Gait velocity  slowed    Gait Comments  ambulates with walking boot Lt LE                 Objective measurements completed on examination: See above findings.      [facility] Adult PT Treatment/Exercise - 04/12/18 0001      Self-Care   Self-Care  --   education re- weaning from walking boot; use of ankle brace      Exercises   Exercises  --   axial extension 10 sec; lateral cervical flexion 10 sec x5       Vasopneumatic   Number Minutes Vasopneumatic   15 minutes    Vasopnuematic Location   Ankle   Lt    Vasopneumatic Pressure  Medium    Vasopneumatic Temperature   34 deg      Ankle Exercises: Stretches   Gastroc Stretch  2 reps;30 seconds   seated with strap   Other Stretch  stretch for dorsum of foot pulling foot/ankle into plantar flexion 30 sec x 2 reps     Other Stretch  hamstring strength 30 sed x 2; ITB 30 sec x 2; piriformis 30 sec x 2 supine with strap       Ankle Exercises: Seated   ABC's  1 rep    Ankle Circles/Pumps  AROM;Left;20 reps   CW/CCW            PT Education - 04/12/18 0845    Education Details  HEP     Person(s) Educated  Patient    Methods  Explanation;Demonstration;Tactile cues;Verbal cues;Handout    Comprehension  Verbalized understanding;Returned demonstration;Verbal cues required;Tactile cues required  PT Long Term Goals - 04/12/18 1250      PT LONG TERM GOAL #1   Title  Improve gait pattern with patient to demonstrate normal gait wihtou boot or brace to allow return to normal functional activities 05/24/18    Time  6    Period  Weeks    Status  New      PT LONG TERM GOAL #2   Title  Increase Lt ankle ROM and strength to =/> than Rt ankle 05/24/18    Time  6    Period  Weeks    Status  New      PT LONG TERM GOAL #3   Title  Decrease Lt ankle pain by 75-90% allowing patient to return to  normal functional activities without limitations 05/24/18    Time  6    Period  Weeks    Status  New      PT LONG TERM GOAL #4   Title  Independent in HEP 05/24/18    Time  6    Period  Weeks    Status  New      PT LONG TERM GOAL #5   Title  Improve FOTO to </= 40% limitation 05/24/18    Time  6    Period  Weeks    Status  New             Plan - 04/12/18 1245    Clinical Impression Statement  Lori King presents s/p Lt ankle sprain following a fall 04/06/18. She has mild edema; limited ankle ROM; decresaed functional mobilty/strength; tenderness to palpation lateral ankle; abnormal gait pattern; persistent pain. She will benefit from PT to address problems identified.     Clinical Presentation  Stable    Clinical Decision Making  Low    Rehab Potential  Good    PT Frequency  2x / week    PT Duration  6 weeks    PT Treatment/Interventions  Patient/family education;ADLs/Self Care Home Management;Cryotherapy;Electrical Stimulation;Iontophoresis 4mg /ml Dexamethasone;Moist Heat;Ultrasound;Dry needling;Manual techniques;Neuromuscular re-education;Gait training;Therapeutic activities;Therapeutic exercise;Balance training    PT Next Visit Plan  review HEP; progress exercise possibly inc gastro/soleus stretches, TB, towel crunch, standing activities as tolerated; manual work/joint mobs; ionto; modalities as indicated (doorway stretch for cervical and trap tightness)    Consulted and Agree with Plan of Care  Patient       Patient will benefit from skilled therapeutic intervention in order to improve the following deficits and impairments:  Postural dysfunction, Improper body mechanics, Pain, Increased fascial restricitons, Increased muscle spasms, Decreased mobility, Decreased range of motion, Abnormal gait, Decreased activity tolerance  Visit Diagnosis: Pain in left ankle and joints of left foot - Plan: PT plan of care cert/re-cert  Other symptoms and signs involving the musculoskeletal  system - Plan: PT plan of care cert/re-cert  Other abnormalities of gait and mobility - Plan: PT plan of care cert/re-cert     Problem List There are no active problems to display for this patient.   Drexel, MPH  04/12/2018, 12:58 PM  [facility] [place], [place], [address] Phone: [phone]   Fax:  [phone]  Name: Lori King MRN: [id number] Date of Birth: 06/22/1966

## 2018-04-12 NOTE — Patient Instructions (Addendum)
HIP: Hamstrings - Supine  Place strap around foot. Raise leg up, keeping knee straight.  Bend opposite knee to protect back if indicated. Hold 30 seconds. 3 reps per set, 2-3 sets per day  Outer Hip Stretch: Reclined IT Band Stretch (Strap)   Strap around one foot, pull leg across body until you feel a pull or stretch in the outside of your hip, with shoulders on mat. Hold for 30 seconds. Repeat 3 times each leg. 2-3 times/day.  Piriformis Stretch   Lying on back, pull right knee toward opposite shoulder. Hold 30 seconds. Repeat 3 times. Do 2-3 sessions per day.   Gastroc, Sitting (Passive)    Sit with strap or towel around ball of foot. Gently pull toward body. Hold _20-30__ seconds.  Repeat _3__ times per session. Do __2-3_ sessions per day.    Ankle Plantar Flexion / Dorsiflexion, Sitting    Sit and gently grasp one foot. Bend foot and ankle up and down. Hold each position _20-30__ seconds. Repeat __3_ times per session. Do ___ sessions per day.    Ankle Alphabet    Using left ankle and foot only, trace the letters of the alphabet. Perform A to Z. Repeat ____ times per set. Do ____ sets per session. Do ____ sessions per day.    Ankle Pump    With left leg elevated, gently flex and extend ankle. Move through full range of motion. Avoid pain. Repeat ____ times per set. Do ____ sets per session. Do ____ sessions per day.    Ankle Circles    Slowly rotate right foot and ankle clockwise then counterclockwise. Gradually increase range of motion. Avoid pain. Circle ____ times each direction per set. Do ____ sets per session. Do ____ sessions per day.     Flexibility: Neck Retraction    Pull head straight back, keeping eyes and jaw level. Hold 10 sec  Repeat _3-4___ times per set.Do __several __ sessions per day.     AROM: Lateral Neck Flexion    Slowly tilt head toward one shoulder, then the other. Hold each position __10__ seconds. Repeat  __3-4__ times per set.  Do _several___ sessions per day.

## 2018-04-14 ENCOUNTER — Ambulatory Visit: Payer: BLUE CROSS/BLUE SHIELD | Admitting: Physical Therapy

## 2018-04-14 DIAGNOSIS — R2689 Other abnormalities of gait and mobility: Secondary | ICD-10-CM

## 2018-04-14 DIAGNOSIS — R29898 Other symptoms and signs involving the musculoskeletal system: Secondary | ICD-10-CM | POA: Diagnosis not present

## 2018-04-14 DIAGNOSIS — M25572 Pain in left ankle and joints of left foot: Secondary | ICD-10-CM | POA: Diagnosis not present

## 2018-04-14 NOTE — Therapy (Signed)
[place], [place], [address] Phone: [phone]   Fax:  [phone]  Physical Therapy Treatment  Patient Details  Name: GAYL IVANOFF MRN: [id number] Date of Birth: 01/15/1966 Referring Provider: Dr. Rod Can   Encounter Date: 04/14/2018  PT End of Session - 04/14/18 0810    Visit Number  2    Number of Visits  12    Date for PT Re-Evaluation  05/24/18    PT Start Time  0805    PT Stop Time  0900    PT Time Calculation (min)  55 min    Activity Tolerance  Patient tolerated treatment well;No increased pain    Behavior During Therapy  [facility] for tasks assessed/performed       No past medical history on file.  Past Surgical History:  Procedure Laterality Date  . BACK SURGERY    . CESAREAN SECTION    . TUBAL LIGATION      There were no vitals filed for this visit.  Subjective Assessment - 04/14/18 0810    Subjective  Pt reports she is doing HEP 2x/day.  She is walking around house without boot, just wearing lace up brace or nothing. When out in community or first thing in the morning.  She feels like her plantar fascitits is acting up.     Patient Stated Goals  get ankle better and return to activites     Currently in Pain?  Yes    Pain Score  3     Pain Location  Ankle    Pain Orientation  Left    Pain Descriptors / Indicators  Aching    Aggravating Factors   walking, standing, foot inversion    Pain Relieving Factors  ice, elevation, medication         [facility] PT Assessment - 04/14/18 0001      Assessment   Medical Diagnosis  Lt ankle sprain     Referring Provider  Dr. Rod Can    Onset Date/Surgical Date  04/06/18    Hand Dominance  Right    Next MD Visit  04/24/18         [facility] Adult PT Treatment/Exercise - 04/14/18 0001      Neck Exercises: Standing   Other Standing Exercises  3 position doorway stretch x 30 sec x 1 rep each position      Neck Exercises:  Seated   Neck Retraction  5 reps;5 secs   cues for improved form      Vasopneumatic   Number Minutes Vasopneumatic   15 minutes    Vasopnuematic Location   Ankle   Lt    Vasopneumatic Pressure  Medium    Vasopneumatic Temperature   34 deg      Manual Therapy   Manual Therapy  Taping    Kinesiotex  Edema      Kinesiotix   Edema  squid shaped piece of Reg rock tape applied to Lt lateral ankle to assist with decreased bruising and swelling.        Neck Exercises: Stretches   Upper Trapezius Stretch  Right;Left;2 reps;30 seconds      Ankle Exercises: Aerobic   Nustep  L1: 5 min for ROM   PTA present to monitor and discuss progress     Ankle Exercises: Seated   Ankle Circles/Pumps  AROM;Left;20 reps   CW/CCW   Towel Crunch  2 reps   challenging   Towel Inversion/Eversion  4 reps   each direction   Heel Raises  10 reps    Toe Raise  10 reps    BAPS  Sitting;Level 3   12/6, 3/9, CW/CCW   Heel Slides  --      Ankle Exercises: Stretches   Soleus Stretch  2 reps;30 seconds   seated with strap   Gastroc Stretch  2 reps;30 seconds   seated with strap   Other Stretch  Lt hamstring stretch x 30 sec x 2      Ankle Exercises: Standing   Other Standing Ankle Exercises  Gait training: 40 ft, (no boot or AD)- VC for Lt heel strike, to roll through foot and push off through toes; VC to even step length.  Gait very slow in speed.                    PT Long Term Goals - 04/14/18 1244      PT LONG TERM GOAL #1   Title  Improve gait pattern with patient to demonstrate normal gait wihtou boot or brace to allow return to normal functional activities 05/24/18    Time  6    Period  Weeks    Status  On-going      PT LONG TERM GOAL #2   Title  Increase Lt ankle ROM and strength to =/> than Rt ankle 05/24/18    Time  6    Period  Weeks    Status  On-going      PT LONG TERM GOAL #3   Title  Decrease Lt ankle pain by 75-90% allowing patient to return to normal functional  activities without limitations 05/24/18    Time  6    Period  Weeks    Status  On-going      PT LONG TERM GOAL #4   Title  Independent in HEP 05/24/18    Time  6    Period  Weeks    Status  On-going      PT LONG TERM GOAL #5   Title  Improve FOTO to </= 40% limitation 05/24/18    Time  6    Period  Weeks    Status  On-going            Plan - 04/14/18 1244    Clinical Impression Statement  Pt demonstrated improved Lt ankle ROM with exercises today.  She would benefit from gait training next visit as her gait (without boot) was observed during session to be very antalgic. Trial of Rock tape applied for swelling reduction. She tolerated all exercises with minimal increase in pain.      Rehab Potential  Good    PT Frequency  2x / week    PT Duration  6 weeks    PT Treatment/Interventions  Patient/family education;ADLs/Self Care Home Management;Cryotherapy;Electrical Stimulation;Iontophoresis 4mg /ml Dexamethasone;Moist Heat;Ultrasound;Dry needling;Manual techniques;Neuromuscular re-education;Gait training;Therapeutic activities;Therapeutic exercise;Balance training    PT Next Visit Plan  add postural strengthening to HEP. Gait training.     Consulted and Agree with Plan of Care  Patient       Patient will benefit from skilled therapeutic intervention in order to improve the following deficits and impairments:  Postural dysfunction, Improper body mechanics, Pain, Increased fascial restricitons, Increased muscle spasms, Decreased mobility, Decreased range of motion, Abnormal gait, Decreased activity tolerance  Visit Diagnosis: Pain in left ankle and joints of left foot  Other symptoms and signs involving the musculoskeletal system  Other abnormalities of gait  and mobility     Problem List There are no active problems to display for this patient.  Kerin Perna, PTA 04/14/18 12:52 PM  [place], [place], [address] Phone: [phone]   Fax:  [phone]  Name: MANAR SMALLING MRN: [id number] Date of Birth: 1965-12-06

## 2018-04-17 ENCOUNTER — Ambulatory Visit: Payer: BLUE CROSS/BLUE SHIELD | Admitting: Physical Therapy

## 2018-04-17 DIAGNOSIS — M25572 Pain in left ankle and joints of left foot: Secondary | ICD-10-CM | POA: Diagnosis not present

## 2018-04-17 DIAGNOSIS — R2689 Other abnormalities of gait and mobility: Secondary | ICD-10-CM | POA: Diagnosis not present

## 2018-04-17 DIAGNOSIS — R29898 Other symptoms and signs involving the musculoskeletal system: Secondary | ICD-10-CM

## 2018-04-17 NOTE — Patient Instructions (Addendum)
  Shoulder Blade Squeeze    Rotate shoulders back, then squeeze shoulder blades together. Hold 10 seconds Repeat __10__ times. Do __several__ sessions per day.  Upper Back Strength: Lower Trapezius / Rotator Cuff " L's "     Arms in waitress pose, palms up. Press hands back and slide shoulder blades down. Hold for __5__ seconds. Repeat _10___ times. 1-2 times per day.    Scapular Retraction: Elbow Flexion (Standing)  "W's"     With elbows bent to 90, pinch shoulder blades together and rotate arms out, keeping elbows bent. Repeat __10__ times per set. Do __1-2__ sets per session. Do _several ___ sessions per day.   [facility] at [place], [place] [address]  [phone] (office) [phone] (fax)   Balance: Unilateral    Attempt to balance on left leg, eyes open. Hold __15-30__ seconds. Repeat _2___ times per set.   [facility] at [facility] [place], [place] [address]  [phone] (office) [phone] (fax)

## 2018-04-17 NOTE — Therapy (Signed)
[place], [place], [address] Phone: [phone]   Fax:  [phone]  Physical Therapy Treatment  Patient Details  Name: Lori King MRN: [id number] Date of Birth: [birth year] Referring Provider: Dr. Rod Can   Encounter Date: 04/17/2018  PT End of Session - 04/17/18 0810    Visit Number  3    Number of Visits  12    Date for PT Re-Evaluation  05/24/18    PT Start Time  0806   pt arrived late   PT Stop Time  0900    PT Time Calculation (min)  54 min    Activity Tolerance  Patient tolerated treatment well    Behavior During Therapy  [facility] for tasks assessed/performed       No past medical history on file.  Past Surgical History:  Procedure Laterality Date  . BACK SURGERY    . CESAREAN SECTION    . TUBAL LIGATION      There were no vitals filed for this visit.  Subjective Assessment - 04/17/18 0811    Subjective  Pt reports she hasn't worn boot since Friday (last treatment).  She has been wearing her brace in community, and wearing nothing at home. She feels the tape helped with the swelling. She is still taking 800 mg of Advil 2x/day.     Currently in Pain?  Yes    Pain Score  1     Pain Location  Ankle    Pain Orientation  Left    Aggravating Factors   foot inversion    Pain Relieving Factors  ice, elevation, medication         [facility] PT Assessment - 04/17/18 0001      Assessment   Medical Diagnosis  Lt ankle sprain     Referring Provider  Dr. Rod Can    Onset Date/Surgical Date  04/06/18    Hand Dominance  Right    Next MD Visit  04/24/18      AROM   Left Ankle Dorsiflexion  6    Left Ankle Plantar Flexion  41    Left Ankle Inversion  14    Left Ankle Eversion  10        [facility] Adult PT Treatment/Exercise - 04/17/18 0001      Shoulder Exercises: Seated   Other Seated Exercises  W's and L's with 5 sec hold x 10 reps each       Vasopneumatic   Number  Minutes Vasopneumatic   15 minutes    Vasopnuematic Location   Ankle   Lt    Vasopneumatic Pressure  Medium    Vasopneumatic Temperature   34 deg      Manual Therapy   Manual Therapy  Taping    Kinesiotex  Edema      Kinesiotix   Edema  2 squid shaped pieces of Reg rock tape applied to Lt lateral ankle to assist with decreased bruising and swelling.        Ankle Exercises: Stretches   Soleus Stretch  2 reps;30 seconds   each leg, standing   Gastroc Stretch  2 reps;30 seconds   standing, runners stretch, each leg     Ankle Exercises: Aerobic   Nustep  L4, slow pace: 5 min for ROM   PTA present to monitor and discuss progress     Ankle Exercises: Seated   Towel Crunch  2 reps   challenging   Toe/ Heel  Raises  Both;10 reps   3 sets, 3 positions   BAPS  Sitting;Level 3   12/6, 3/9, CW/CCW   Other Seated Ankle Exercises  Lt ankle isometrics inversion/eversion 5 sec x 5 reps       Ankle Exercises: Standing   SLS  Lt x 4 reps, up to 5 sec (Rt up to 20 seconds)             PT Education - 04/17/18 1527    Education Details  HEP    Person(s) Educated  Patient    Methods  Explanation;Demonstration;Verbal cues;Handout    Comprehension  Verbalized understanding;Returned demonstration          PT Long Term Goals - 04/14/18 1244      PT LONG TERM GOAL #1   Title  Improve gait pattern with patient to demonstrate normal gait wihtou boot or brace to allow return to normal functional activities 05/24/18    Time  6    Period  Weeks    Status  On-going      PT LONG TERM GOAL #2   Title  Increase Lt ankle ROM and strength to =/> than Rt ankle 05/24/18    Time  6    Period  Weeks    Status  On-going      PT LONG TERM GOAL #3   Title  Decrease Lt ankle pain by 75-90% allowing patient to return to normal functional activities without limitations 05/24/18    Time  6    Period  Weeks    Status  On-going      PT LONG TERM GOAL #4   Title  Independent in HEP 05/24/18     Time  6    Period  Weeks    Status  On-going      PT LONG TERM GOAL #5   Title  Improve FOTO to </= 40% limitation 05/24/18    Time  6    Period  Weeks    Status  On-going            Plan - 04/17/18 1531    Clinical Impression Statement  Pt demonstrated improved Lt ankle ROM.  Pt has reported improved neck pain since being out of boot on Friday and initiating stretches; added postural strengthening as well today.  Improved swelling in forefoot; reapplied tape today.  She tolerated all exercises well.  She is progressing well towards therapy goals.     Rehab Potential  Good    PT Frequency  2x / week    PT Duration  6 weeks    PT Treatment/Interventions  Patient/family education;ADLs/Self Care Home Management;Cryotherapy;Electrical Stimulation;Iontophoresis 4mg /ml Dexamethasone;Moist Heat;Ultrasound;Dry needling;Manual techniques;Neuromuscular re-education;Gait training;Therapeutic activities;Therapeutic exercise;Balance training    PT Next Visit Plan  continue progressive Lt ankle ROM and strengthening; modalities and tape as indicated.     Consulted and Agree with Plan of Care  Patient       Patient will benefit from skilled therapeutic intervention in order to improve the following deficits and impairments:  Postural dysfunction, Improper body mechanics, Pain, Increased fascial restricitons, Increased muscle spasms, Decreased mobility, Decreased range of motion, Abnormal gait, Decreased activity tolerance  Visit Diagnosis: Pain in left ankle and joints of left foot  Other symptoms and signs involving the musculoskeletal system  Other abnormalities of gait and mobility     Problem List There are no active problems to display for this patient.  Kerin Perna, PTA 04/17/18 3:36 PM  [place]  [facility] [place] [address] [place], [place], [address] Phone: [phone]   Fax:  [phone]  Name: Lori King MRN:  [id number] Date of Birth: Oct 07, 1965

## 2018-04-20 ENCOUNTER — Ambulatory Visit: Payer: BLUE CROSS/BLUE SHIELD | Admitting: Physical Therapy

## 2018-04-20 ENCOUNTER — Encounter: Payer: Self-pay | Admitting: Physical Therapy

## 2018-04-20 DIAGNOSIS — M25572 Pain in left ankle and joints of left foot: Secondary | ICD-10-CM | POA: Diagnosis not present

## 2018-04-20 DIAGNOSIS — R29898 Other symptoms and signs involving the musculoskeletal system: Secondary | ICD-10-CM

## 2018-04-20 DIAGNOSIS — R2689 Other abnormalities of gait and mobility: Secondary | ICD-10-CM | POA: Diagnosis not present

## 2018-04-20 NOTE — Therapy (Signed)
[place], [place], [address] Phone: [phone]   Fax:  [phone]  Physical Therapy Treatment  Patient Details  Name: Lori King MRN: [id number] Date of Birth: Feb 03, 1966 Referring Provider: Dr. Rod Can   Encounter Date: 04/20/2018  PT End of Session - 04/20/18 1037    Visit Number  4    Number of Visits  12    Date for PT Re-Evaluation  05/24/18    PT Start Time  1020    PT Stop Time  1117    PT Time Calculation (min)  57 min       History reviewed. No pertinent past medical history.  Past Surgical History:  Procedure Laterality Date  . BACK SURGERY    . CESAREAN SECTION    . TUBAL LIGATION      There were no vitals filed for this visit.  Subjective Assessment - 04/20/18 1025    Subjective  Pt reports she is feeling frustrated that she's not further along.  She's been doing more, and feeling more ankle pain afterwards (ie: walking/ grocery shopping).  Brusing is improving. She no longer has pain in her neck.    Patient Stated Goals  get ankle better and return to activites     Currently in Pain?  Yes    Pain Score  3     Pain Location  Ankle    Pain Orientation  Left    Pain Descriptors / Indicators  Aching    Aggravating Factors   foot inversion    Pain Relieving Factors  ice, elevation, medication          [facility] PT Assessment - 04/20/18 0001      Assessment   Medical Diagnosis  Lt ankle sprain     Referring Provider  Dr. Rod Can    Onset Date/Surgical Date  04/06/18    Hand Dominance  Right    Next MD Visit  04/28/18       [facility] Adult PT Treatment/Exercise - 04/20/18 0001      Vasopneumatic   Number Minutes Vasopneumatic   15 minutes    Vasopnuematic Location   Ankle   Lt    Vasopneumatic Pressure  Medium    Vasopneumatic Temperature   34 deg      Manual Therapy   Manual Therapy  Taping    Kinesiotex  Edema      Kinesiotix   Edema  2 squid  shaped pieces of Reg rock tape applied to Lt lateral ankle to assist with decreased bruising and swelling.        Ankle Exercises: Seated   Ankle Circles/Pumps  AROM;Left;10 reps   CW/CCW   Heel Raises  Both;10 reps    Toe Raise  10 reps   3 positions   BAPS  Sitting;Level 3;10 reps   12/6, 3/9, CW/CCW     Ankle Exercises: Stretches   Soleus Stretch  2 reps;30 seconds   each leg, standing   Gastroc Stretch  2 reps;30 seconds   standing, runners stretch, each leg     Ankle Exercises: Standing   SLS  Lt x 4 reps, up to 5 sec (Rt up to 20 seconds)    Other Standing Ankle Exercises  3" stairs: ascending / descending with reciprocal pattern x 12 steps, repeated on 6"       Ankle Exercises: Aerobic   Nustep  L4, slow pace: 6 min for ROM  PTA present to monitor and discuss progress            PT Education - 04/20/18 1121    Education Details  HEP - added isometrics    Person(s) Educated  Patient    Methods  Explanation;Handout;Demonstration;Verbal cues    Comprehension  Verbalized understanding;Returned demonstration          PT Long Term Goals - 04/14/18 1244      PT LONG TERM GOAL #1   Title  Improve gait pattern with patient to demonstrate normal gait wihtou boot or brace to allow return to normal functional activities 05/24/18    Time  6    Period  Weeks    Status  On-going      PT LONG TERM GOAL #2   Title  Increase Lt ankle ROM and strength to =/> than Rt ankle 05/24/18    Time  6    Period  Weeks    Status  On-going      PT LONG TERM GOAL #3   Title  Decrease Lt ankle pain by 75-90% allowing patient to return to normal functional activities without limitations 05/24/18    Time  6    Period  Weeks    Status  On-going      PT LONG TERM GOAL #4   Title  Independent in HEP 05/24/18    Time  6    Period  Weeks    Status  On-going      PT LONG TERM GOAL #5   Title  Improve FOTO to </= 40% limitation 05/24/18    Time  6    Period  Weeks    Status   On-going            Plan - 04/20/18 1212    Clinical Impression Statement  Pt reporting increased pain with increase in activity.  Encouraged pt to have patience and limit painful activity while ankle is healing.  She tolerated all exercises well, with only mild increase in discomfort. L3 on BAPS board seated now getting easy.  Pain at end of session eased with use of vaso. Isometrics for ankle added to HEP (yellow band too difficult).  Progressing well towards goals.     Rehab Potential  Good    PT Frequency  2x / week    PT Duration  6 weeks    PT Treatment/Interventions  Patient/family education;ADLs/Self Care Home Management;Cryotherapy;Electrical Stimulation;Iontophoresis 4mg /ml Dexamethasone;Moist Heat;Ultrasound;Dry needling;Manual techniques;Neuromuscular re-education;Gait training;Therapeutic activities;Therapeutic exercise;Balance training    PT Next Visit Plan  move up to L4:BAPS. continue progressive Lt ankle ROM and strengthening.     Consulted and Agree with Plan of Care  Patient       Patient will benefit from skilled therapeutic intervention in order to improve the following deficits and impairments:  Postural dysfunction, Improper body mechanics, Pain, Increased fascial restricitons, Increased muscle spasms, Decreased mobility, Decreased range of motion, Abnormal gait, Decreased activity tolerance  Visit Diagnosis: Pain in left ankle and joints of left foot  Other symptoms and signs involving the musculoskeletal system  Other abnormalities of gait and mobility     Problem List There are no active problems to display for this patient.  Kerin Perna, PTA 04/20/18 12:18 PM  [place], [place], [address] Phone: [phone]   Fax:  [phone]  Name: MAIRE GOVAN MRN: [id number] Date of Birth: Jan 19, 1966

## 2018-04-20 NOTE — Patient Instructions (Signed)
  Inversion: Isometric   Press inner borders of feet into ball or rolled pillow between feet. Hold _5-10___ seconds. Relax. Repeat __10__ times per set. Do __1__ sets per session. Do __2__ sessions per day.  Pillow Squeeze (Isometric Dorsiflexion)   Lying with pillow between feet, one foot on top, squeeze feet together, bringing bottom foot up while pushing top one down. Hold __5-10__ seconds. Repeat with other foot on top. Repeat _10___ times. Do _2___ sessions per day.  Eversion: Isometric   Press outer border of right foot into ball or rolled pillow against wall. Hold __5-10__ seconds. Relax. Repeat __10__ times per set. Do _1___ sets per session. Do _2___ sessions per day.   [facility] at [facility] [place], [place] [address]  [phone] (office) [phone] (fax)

## 2018-04-25 ENCOUNTER — Ambulatory Visit: Payer: BLUE CROSS/BLUE SHIELD | Admitting: Physical Therapy

## 2018-04-25 DIAGNOSIS — R29898 Other symptoms and signs involving the musculoskeletal system: Secondary | ICD-10-CM

## 2018-04-25 DIAGNOSIS — M25572 Pain in left ankle and joints of left foot: Secondary | ICD-10-CM

## 2018-04-25 DIAGNOSIS — R2689 Other abnormalities of gait and mobility: Secondary | ICD-10-CM | POA: Diagnosis not present

## 2018-04-25 NOTE — Therapy (Signed)
[place], [place], [address] Phone: [phone]   Fax:  [phone]  Physical Therapy Treatment  Patient Details  Name: Lori King MRN: [id number] Date of Birth: June 08, 1966 Referring Provider: Dr. Rod Can   Encounter Date: 04/25/2018  PT End of Session - 04/25/18 0858    Visit Number  5    Number of Visits  12    Date for PT Re-Evaluation  05/24/18    PT Start Time  0848    PT Stop Time  0945    PT Time Calculation (min)  57 min    Activity Tolerance  Patient tolerated treatment well    Behavior During Therapy  [facility] for tasks assessed/performed       No past medical history on file.  Past Surgical History:  Procedure Laterality Date  . BACK SURGERY    . CESAREAN SECTION    . TUBAL LIGATION      There were no vitals filed for this visit.  Subjective Assessment - 04/25/18 0858    Subjective  Pt reports she has decreased her pain medicine (Advil) to 2 pills per day.   She has increased her steps per day from [place] to [place]. Her Lt plantar fascia pain has increased over last few days.     Currently in Pain?  Yes    Pain Score  2     Pain Location  Ankle    Pain Orientation  Left    Pain Descriptors / Indicators  Aching;Tightness    Aggravating Factors   foot inversion    Pain Relieving Factors  ice, medication         [facility] PT Assessment - 04/25/18 0001      Assessment   Medical Diagnosis  Lt ankle sprain     Referring Provider  Dr. Rod Can    Onset Date/Surgical Date  04/06/18    Hand Dominance  Right    Next MD Visit  04/28/18        [facility] Adult PT Treatment/Exercise - 04/25/18 0001      Vasopneumatic   Number Minutes Vasopneumatic   15 minutes    Vasopnuematic Location   Ankle   Lt    Vasopneumatic Pressure  Medium    Vasopneumatic Temperature   34 deg      Manual Therapy   Manual therapy comments  STM to Lt plantar fascia;   2-I strips applied with 25%  stretch to plantar fascia and 2 perpendicular strips applied with 50% stretch to arch to support foot, decmpress tissue and decrease pain.       Kinesiotix   Edema  1 squid shaped pieces of Reg rock tape applied to Lt lateral ankle to assist with decreased bruising and swelling.        Ankle Exercises: Seated   Ankle Circles/Pumps  AROM;Left;10 reps   CW/CCW   Heel Raises  Both;10 reps    BAPS  Sitting;10 reps;Level 4   12/6, 3/9, CW/CCW     Ankle Exercises: Standing   SLS  Lt SLS on minitramp for 30 sec x 3 reps, 2 reps RLE.       Ankle Exercises: Stretches   Plantar Fascia Stretch  2 reps;30 seconds    Soleus Stretch  2 reps;30 seconds   each leg, standing   Gastroc Stretch  2 reps;30 seconds   standing, runners stretch, each leg   Other Stretch  Lt hamstring stretch in  sitting - modified for increased comfort in foot - 20 sec       Ankle Exercises: Aerobic   Nustep  L4, slow pace: 6 min for ROM   PTA present to monitor and discuss progress                 PT Long Term Goals - 04/14/18 1244      PT LONG TERM GOAL #1   Title  Improve gait pattern with patient to demonstrate normal gait wihtou boot or brace to allow return to normal functional activities 05/24/18    Time  6    Period  Weeks    Status  On-going      PT LONG TERM GOAL #2   Title  Increase Lt ankle ROM and strength to =/> than Rt ankle 05/24/18    Time  6    Period  Weeks    Status  On-going      PT LONG TERM GOAL #3   Title  Decrease Lt ankle pain by 75-90% allowing patient to return to normal functional activities without limitations 05/24/18    Time  6    Period  Weeks    Status  On-going      PT LONG TERM GOAL #4   Title  Independent in HEP 05/24/18    Time  6    Period  Weeks    Status  On-going      PT LONG TERM GOAL #5   Title  Improve FOTO to </= 40% limitation 05/24/18    Time  6    Period  Weeks    Status  On-going            Plan - 04/25/18 1133    Clinical Impression  Statement  Pt reporting increased discomfort in Lt plantar fascia, likely due to increase in WB activites.  She was able to tolerate L4 on seated BAPS board, and SLS balance has improved.  Pt reported decrease in pain after manual / vaso/ and tape application.  Progressing towards goals.     Rehab Potential  Good    PT Frequency  2x / week    PT Duration  6 weeks    PT Treatment/Interventions  Patient/family education;ADLs/Self Care Home Management;Cryotherapy;Electrical Stimulation;Iontophoresis 4mg /ml Dexamethasone;Moist Heat;Ultrasound;Dry needling;Manual techniques;Neuromuscular re-education;Gait training;Therapeutic activities;Therapeutic exercise;Balance training    PT Next Visit Plan  assess response to tape.  continue progressive Lt ankle ROM and strengthening. MD note- FOTO    Consulted and Agree with Plan of Care  Patient       Patient will benefit from skilled therapeutic intervention in order to improve the following deficits and impairments:  Postural dysfunction, Improper body mechanics, Pain, Increased fascial restricitons, Increased muscle spasms, Decreased mobility, Decreased range of motion, Abnormal gait, Decreased activity tolerance  Visit Diagnosis: Pain in left ankle and joints of left foot  Other symptoms and signs involving the musculoskeletal system  Other abnormalities of gait and mobility     Problem List There are no active problems to display for this patient.  Kerin Perna, PTA 04/25/18 11:42 AM   [facility] [place], [place], [address] Phone: [phone]   Fax:  [phone]  Name: Lori King MRN: [id number] Date of Birth: 1965/12/08

## 2018-04-27 ENCOUNTER — Ambulatory Visit: Payer: BLUE CROSS/BLUE SHIELD | Admitting: Physical Therapy

## 2018-04-27 DIAGNOSIS — R29898 Other symptoms and signs involving the musculoskeletal system: Secondary | ICD-10-CM | POA: Diagnosis not present

## 2018-04-27 DIAGNOSIS — R2689 Other abnormalities of gait and mobility: Secondary | ICD-10-CM

## 2018-04-27 DIAGNOSIS — M25572 Pain in left ankle and joints of left foot: Secondary | ICD-10-CM | POA: Diagnosis not present

## 2018-04-27 NOTE — Therapy (Signed)
[place], [place], [address] Phone: [phone]   Fax:  [phone]  Physical Therapy Treatment  Patient Details  Name: Lori King MRN: [id number] Date of Birth: [birth year] Referring Provider: Dr. Rod Can   Encounter Date: 04/27/2018  PT End of Session - 04/27/18 0808    Visit Number  6    Number of Visits  12    Date for PT Re-Evaluation  05/24/18    PT Start Time  0802    PT Stop Time  0900    PT Time Calculation (min)  58 min    Activity Tolerance  Patient tolerated treatment well;No increased pain    Behavior During Therapy  [facility] for tasks assessed/performed       No past medical history on file.  Past Surgical History:  Procedure Laterality Date  . BACK SURGERY    . CESAREAN SECTION    . TUBAL LIGATION      There were no vitals filed for this visit.  Subjective Assessment - 04/27/18 0808    Subjective  Pt reports the Rock tape on bottom of foot helped ease the plantar fascia pain.  She walked dog yesterday for 2300 steps; no pain going down hill, but increased pain going back up hill.  She was able to getting on rowing machine for 5 min without any difficulty or pain.  She is pleased with progress so far, "just feeling impatient".      Patient Stated Goals  get ankle better and return to activites     Currently in Pain?  Yes    Pain Score  2     Pain Location  Ankle    Pain Orientation  Left    Pain Descriptors / Indicators  Dull;Aching;Tightness    Aggravating Factors   foot flexion and inversion    Pain Relieving Factors  ice, medication         [facility] PT Assessment - 04/27/18 0001      Assessment   Medical Diagnosis  Lt ankle sprain     Referring Provider  Dr. Rod Can    Onset Date/Surgical Date  04/06/18    Hand Dominance  Right    Next MD Visit  04/28/18      ROM / Strength   AROM / PROM / Strength  AROM;PROM      AROM   AROM Assessment Site   Ankle    Right/Left Ankle  Left;Right    Right Ankle Dorsiflexion  14    Right Ankle Plantar Flexion  62    Right Ankle Inversion  37    Right Ankle Eversion  25    Left Ankle Dorsiflexion  6   with pain   Left Ankle Plantar Flexion  44    Left Ankle Inversion  18    Left Ankle Eversion  16       [facility] Adult PT Treatment/Exercise - 04/27/18 0001      Vasopneumatic   Number Minutes Vasopneumatic   15 minutes    Vasopnuematic Location   Ankle   Lt    Vasopneumatic Pressure  Medium    Vasopneumatic Temperature   34 deg      Manual Therapy   Manual Therapy  Soft tissue mobilization;Joint mobilization;Taping;Passive ROM    Manual therapy comments    1-I strips of 3" dynamic tape applied with 25% stretch to Lt plantar fascia and 1 perpendicular strips applied with 50% stretch  to arch to support foot, decompress tissue and decrease pain.     Joint Mobilization  Grades I-III talocrural joint mobs to decrease pain and increase ROM.    Soft tissue mobilization  STM to Lt plantar fascia;     Passive ROM  Lt ankle ROM in all directions, to tolerance.       Ankle Exercises: Seated   BAPS  Sitting;Level 4;15 reps   12/6, 3/9, CW/CCW     Ankle Exercises: Stretches   Plantar Fascia Stretch  2 reps;30 seconds    Soleus Stretch  2 reps;30 seconds   each leg, standing   Gastroc Stretch  2 reps;30 seconds   standing, runners stretch, each leg     Ankle Exercises: Aerobic   Nustep  L5, slow pace: 6 min for ROM   PTA present to monitor and discuss progress            PT Education - 04/27/18 0934    Education Details  reviewed HEP.  instructed pt on self PROM of Lt ankle.     Person(s) Educated  Patient    Methods  Explanation;Demonstration    Comprehension  Verbalized understanding;Returned demonstration          PT Long Term Goals - 04/14/18 1244      PT LONG TERM GOAL #1   Title  Improve gait pattern with patient to demonstrate normal gait wihtou boot or brace to allow  return to normal functional activities 05/24/18    Time  6    Period  Weeks    Status  On-going      PT LONG TERM GOAL #2   Title  Increase Lt ankle ROM and strength to =/> than Rt ankle 05/24/18    Time  6    Period  Weeks    Status  On-going      PT LONG TERM GOAL #3   Title  Decrease Lt ankle pain by 75-90% allowing patient to return to normal functional activities without limitations 05/24/18    Time  6    Period  Weeks    Status  On-going      PT LONG TERM GOAL #4   Title  Independent in HEP 05/24/18    Time  6    Period  Weeks    Status  On-going      PT LONG TERM GOAL #5   Title  Improve FOTO to </= 40% limitation 05/24/18    Time  6    Period  Weeks    Status  On-going            Plan - 04/27/18 0856    Clinical Impression Statement  Pt continues to be limited in Lt ankle ROM.  She has pain with passive ROM in all directions.  She has tolerated all exercises well, with mild increase in pain. Pain reduced with use of vaso at end of session.      Rehab Potential  Good       Patient will benefit from skilled therapeutic intervention in order to improve the following deficits and impairments:  Postural dysfunction, Improper body mechanics, Pain, Increased fascial restricitons, Increased muscle spasms, Decreased mobility, Decreased range of motion, Abnormal gait, Decreased activity tolerance  Visit Diagnosis: Pain in left ankle and joints of left foot  Other symptoms and signs involving the musculoskeletal system  Other abnormalities of gait and mobility     Problem List There are no active problems to display  for this patient.  Kerin Perna, PTA 04/27/18 9:35 AM  [facility] [place], [place], [address] Phone: [phone]   Fax:  [phone]  Name: Lori King MRN: [id number] Date of Birth: 1966/06/06

## 2018-05-02 ENCOUNTER — Encounter: Payer: BLUE CROSS/BLUE SHIELD | Admitting: Physical Therapy

## 2018-05-04 ENCOUNTER — Ambulatory Visit (INDEPENDENT_AMBULATORY_CARE_PROVIDER_SITE_OTHER): Payer: BLUE CROSS/BLUE SHIELD | Admitting: Physical Therapy

## 2018-05-04 ENCOUNTER — Encounter: Payer: Self-pay | Admitting: Physical Therapy

## 2018-05-04 DIAGNOSIS — R2689 Other abnormalities of gait and mobility: Secondary | ICD-10-CM | POA: Diagnosis not present

## 2018-05-04 DIAGNOSIS — M25572 Pain in left ankle and joints of left foot: Secondary | ICD-10-CM | POA: Diagnosis not present

## 2018-05-04 DIAGNOSIS — R29898 Other symptoms and signs involving the musculoskeletal system: Secondary | ICD-10-CM

## 2018-05-04 NOTE — Therapy (Signed)
[place], [place], [address] Phone: [phone]   Fax:  [phone]  Physical Therapy Treatment  Patient Details  Name: Lori King MRN: [id number] Date of Birth: [birth year] Referring Provider: Dr. Rod Can   Encounter Date: 05/04/2018  PT End of Session - 05/04/18 0938    Visit Number  7    Number of Visits  12    Date for PT Re-Evaluation  05/24/18    PT Start Time  0934    PT Stop Time  1029    PT Time Calculation (min)  55 min       History reviewed. No pertinent past medical history.  Past Surgical History:  Procedure Laterality Date  . BACK SURGERY    . CESAREAN SECTION    . TUBAL LIGATION      There were no vitals filed for this visit.  Subjective Assessment - 05/04/18 0939    Subjective  Pt reports she had follow up with MD.  "He told me to lose the brace. He changed my medicine, too."  She is noticing greater ease to walk on even surfaces, but it still hurts to walk on uneven surfaces.  She was able to swim, gently, without difficulty or pain.     Patient Stated Goals  get ankle better and return to activites     Currently in Pain?  Yes    Pain Score  1     Pain Location  Ankle    Pain Orientation  Left    Pain Descriptors / Indicators  Dull    Aggravating Factors   certain motions    Pain Relieving Factors  ice, medication         [facility] PT Assessment - 05/04/18 0001      Assessment   Medical Diagnosis  Lt ankle sprain     Referring Provider  Dr. Rod Can    Onset Date/Surgical Date  04/06/18    Hand Dominance  Right    Next MD Visit  06/09/18      AROM   Left Ankle Dorsiflexion  8    Left Ankle Plantar Flexion  44    Left Ankle Inversion  20    Left Ankle Eversion  18        [facility] Adult PT Treatment/Exercise - 05/04/18 0001      Vasopneumatic   Number Minutes Vasopneumatic   15 minutes    Vasopnuematic Location   Ankle   Lt     Vasopneumatic Pressure  Medium    Vasopneumatic Temperature   34 deg      Kinesiotix   Edema  I strip of reg rock tape applied in fig 8 pattern with x across talus (anchored at calcaneus) for added support and stability, as well as increased proprioception.       Ankle Exercises: Stretches   Soleus Stretch  2 reps;30 seconds   each leg, standing   Gastroc Stretch  2 reps;30 seconds   standing, runners stretch, each leg   Other Stretch  seated on heels (on mat table) with light arm support x 15 sec x 2 reps, to tolerance      Ankle Exercises: Aerobic   Nustep  L4: 6.5 min    moderate pace.      Ankle Exercises: Standing   SLS  Lt SLS x 25 seconds without UE support.     Heel Raises  Both;5 reps   2 sets  Toe Raise  10 reps   challenging    Other Standing Ankle Exercises  Forward step down, retro step up on 6" step with BUE support x 12 reps, VC to slow speed and avoid hopping.        Ankle Exercises: Seated   Other Seated Ankle Exercises  Lt ankle inversion, eversion, DF with yellow band x 10 each direction       Neck Exercises: Stretches   Other Neck Stretches  dooway stretch -mid level x 2 reps of 30 sec              PT Education - 05/04/18 1017    Education Details  HEP - issued yellow band.     Person(s) Educated  Patient    Methods  Explanation;Demonstration;Handout    Comprehension  Verbalized understanding;Returned demonstration          PT Long Term Goals - 04/14/18 1244      PT LONG TERM GOAL #1   Title  Improve gait pattern with patient to demonstrate normal gait wihtou boot or brace to allow return to normal functional activities 05/24/18    Time  6    Period  Weeks    Status  On-going      PT LONG TERM GOAL #2   Title  Increase Lt ankle ROM and strength to =/> than Rt ankle 05/24/18    Time  6    Period  Weeks    Status  On-going      PT LONG TERM GOAL #3   Title  Decrease Lt ankle pain by 75-90% allowing patient to return to normal  functional activities without limitations 05/24/18    Time  6    Period  Weeks    Status  On-going      PT LONG TERM GOAL #4   Title  Independent in HEP 05/24/18    Time  6    Period  Weeks    Status  On-going      PT LONG TERM GOAL #5   Title  Improve FOTO to </= 40% limitation 05/24/18    Time  6    Period  Weeks    Status  On-going            Plan - 05/04/18 1214    Clinical Impression Statement  Pt demonstrated small improvement in Lt ankle ROM.  She had some discomfort across top of ankle with SLS and step downs; improved after Rock tape application.  Pt continues with decreased strength, but tolerated light resistance with ankle exercises without increase in pain.  She has met LTG#1 and is Progressing towards other goals.  Pt requests decrease of freq to 1x/wk.     Rehab Potential  Good    PT Frequency  2x / week    PT Duration  6 weeks    PT Treatment/Interventions  Patient/family education;ADLs/Self Care Home Management;Cryotherapy;Electrical Stimulation;Iontophoresis 63m/ml Dexamethasone;Moist Heat;Ultrasound;Dry needling;Manual techniques;Neuromuscular re-education;Gait training;Therapeutic activities;Therapeutic exercise;Balance training    PT Next Visit Plan  assess response to tape and new exercises.     Consulted and Agree with Plan of Care  Patient       Patient will benefit from skilled therapeutic intervention in order to improve the following deficits and impairments:  Postural dysfunction, Improper body mechanics, Pain, Increased fascial restricitons, Increased muscle spasms, Decreased mobility, Decreased range of motion, Abnormal gait, Decreased activity tolerance  Visit Diagnosis: Pain in left ankle and joints of left foot  Other symptoms and signs involving the musculoskeletal system  Other abnormalities of gait and mobility     Problem List There are no active problems to display for this patient.  Kerin Perna, PTA 05/04/18 4:00  PM  [place], [place], [address] Phone: [phone]   Fax:  [phone]  Name: Lori King MRN: [id number] Date of Birth: 03/13/1966

## 2018-05-04 NOTE — Patient Instructions (Signed)
Inversion: Resisted   Cross legs with right leg underneath, foot in tubing loop. Hold tubing around other foot to resist and turn foot in. Repeat __10__ times per set. Do __2__ sets per session. Do _3___ sessions per week  Eversion: Resisted   With right foot in tubing loop, hold tubing around other foot to resist and turn foot out. Repeat _10___ times per set. Do __2__ sets per session. Do __3__ sessions per week.  Dorsiflexion: Resisted   Facing anchor, tubing around left foot, pull toward face.  Repeat _10___ times per set. Do __2__ sets per session. Do ___3_ sessions per week.  Toe / Heel Raise    Gently rock back on heels and raise toes. Then rock forward on toes and raise heels. Repeat sequence __5-10__ times per session.  2 sets, 3 x / wk.   [facility] [place], [place] [address]  [phone] (office) [phone] (fax)

## 2018-05-09 ENCOUNTER — Ambulatory Visit: Payer: BLUE CROSS/BLUE SHIELD | Admitting: Physical Therapy

## 2018-05-09 DIAGNOSIS — R29898 Other symptoms and signs involving the musculoskeletal system: Secondary | ICD-10-CM | POA: Diagnosis not present

## 2018-05-09 DIAGNOSIS — M25572 Pain in left ankle and joints of left foot: Secondary | ICD-10-CM

## 2018-05-09 DIAGNOSIS — R2689 Other abnormalities of gait and mobility: Secondary | ICD-10-CM

## 2018-05-09 NOTE — Therapy (Signed)
[place], [place], [address] Phone: [phone]   Fax:  [phone]  Physical Therapy Treatment  Patient Details  Name: Lori King MRN: [id number] Date of Birth: [birth year] Referring Provider: Dr. Rod Can   Encounter Date: 05/09/2018  PT End of Session - 05/09/18 1240    Visit Number  8    Number of Visits  12    Date for PT Re-Evaluation  05/24/18    PT Start Time  0935    PT Stop Time  1030    PT Time Calculation (min)  55 min    Behavior During Therapy  [facility] for tasks assessed/performed       No past medical history on file.  Past Surgical History:  Procedure Laterality Date  . BACK SURGERY    . CESAREAN SECTION    . TUBAL LIGATION      There were no vitals filed for this visit.  Subjective Assessment - 05/09/18 0937    Subjective  Pt reports she has not received her prescribed mail order medicine yet.  Her ankle feels more swollen and painful. "I feel like I'm getting more strength, and am able to do a little more".      Currently in Pain?  Yes    Pain Score  2    3 ibprofen at 9am.    Pain Location  Ankle    Pain Orientation  Left    Aggravating Factors   certain motion, attempts at child pose    Pain Relieving Factors  ice, medication         [facility] PT Assessment - 05/09/18 0001      Assessment   Medical Diagnosis  Lt ankle sprain     Referring Provider  Dr. Rod Can    Onset Date/Surgical Date  04/06/18    Hand Dominance  Right    Next MD Visit  06/09/18    Prior Therapy  none      AROM   Left Ankle Dorsiflexion  10    Left Ankle Plantar Flexion  49    Left Ankle Inversion  24    Left Ankle Eversion  18        [facility] Adult PT Treatment/Exercise - 05/09/18 0001      Modalities   Modalities  Vasopneumatic;Ultrasound;Iontophoresis      Ultrasound   Ultrasound Location  Lt ankle (talus, ant medial malleoli)    Ultrasound Parameters  50%, 1.1  w/cm2, 3.3 mHz, 8 min     Ultrasound Goals  Pain;Edema      Iontophoresis   Type of Iontophoresis  Dexamethasone    Location  see [place] location    Dose  1.0 cc    Time  80 mA stat patch, 6 hrs      Vasopneumatic   Number Minutes Vasopneumatic   15 minutes    Vasopnuematic Location   Ankle   Lt    Vasopneumatic Pressure  Medium    Vasopneumatic Temperature   34 deg      Manual Therapy   Joint Mobilization  Grades I-III talocrural joint mobs to decrease pain and increase ROM.    Soft tissue mobilization  STM to Lt dorsal surface of foot; IASTM to Lt post tib (distal medial ankle) and STM/MFR to Lt gastoc/soleus      Passive ROM  Lt ankle ROM in all directions, to tolerance.       Ankle Exercises: Aerobic  Nustep  L5: 5.5 min    moderate pace.      Ankle Exercises: Stretches   Soleus Stretch  2 reps;30 seconds   each leg, standing   Gastroc Stretch  2 reps;30 seconds   standing, runners stretch, each leg     Ankle Exercises: Supine   Other Supine Ankle Exercises  ankle circles x 10x 3 sets             PT Education - 05/09/18 1226    Education Details  ionto info    Person(s) Educated  Patient    Methods  Explanation;Handout    Comprehension  Verbalized understanding          PT Long Term Goals - 04/14/18 1244      PT LONG TERM GOAL #1   Title  Improve gait pattern with patient to demonstrate normal gait wihtou boot or brace to allow return to normal functional activities 05/24/18    Time  6    Period  Weeks    Status  On-going      PT LONG TERM GOAL #2   Title  Increase Lt ankle ROM and strength to =/> than Rt ankle 05/24/18    Time  6    Period  Weeks    Status  On-going      PT LONG TERM GOAL #3   Title  Decrease Lt ankle pain by 75-90% allowing patient to return to normal functional activities without limitations 05/24/18    Time  6    Period  Weeks    Status  On-going      PT LONG TERM GOAL #4   Title  Independent in HEP 05/24/18    Time  6     Period  Weeks    Status  On-going      PT LONG TERM GOAL #5   Title  Improve FOTO to </= 40% limitation 05/24/18    Time  6    Period  Weeks    Status  On-going            Plan - 05/09/18 1224    Clinical Impression Statement  Pt presents with slightly more swelling in Lt anterior/lateral ankle today, with increased report of pain.  Session focused on increasing ROM and decreasing pain/swelling.  Trial of ionto patch initiated today.  Pt continues to demonstrate small gains in ROM each visit.  Pt has met LTG#1.    Rehab Potential  Good    PT Frequency  2x / week    PT Duration  6 weeks    PT Treatment/Interventions  Patient/family education;ADLs/Self Care Home Management;Cryotherapy;Electrical Stimulation;Iontophoresis 82m/ml Dexamethasone;Moist Heat;Ultrasound;Dry needling;Manual techniques;Neuromuscular re-education;Gait training;Therapeutic activities;Therapeutic exercise;Balance training    PT Next Visit Plan  assess response to ionto patch and band ankle exercises.     Consulted and Agree with Plan of Care  Patient       Patient will benefit from skilled therapeutic intervention in order to improve the following deficits and impairments:  Postural dysfunction, Improper body mechanics, Pain, Increased fascial restricitons, Increased muscle spasms, Decreased mobility, Decreased range of motion, Abnormal gait, Decreased activity tolerance  Visit Diagnosis: Pain in left ankle and joints of left foot  Other symptoms and signs involving the musculoskeletal system  Other abnormalities of gait and mobility     Problem List There are no active problems to display for this patient.  JKerin Perna PTA 05/09/18 12:41 PM  [facility] [place] [address]  [place], [place], [address] Phone: [phone]   Fax:  [phone]  Name: SKYELYNN RAMBEAU MRN: [id number] Date of Birth: 03-Jan-1966

## 2018-05-09 NOTE — Patient Instructions (Signed)

## 2018-05-16 ENCOUNTER — Encounter: Payer: Self-pay | Admitting: Physical Therapy

## 2018-05-16 ENCOUNTER — Ambulatory Visit: Payer: BLUE CROSS/BLUE SHIELD | Admitting: Physical Therapy

## 2018-05-16 DIAGNOSIS — M25572 Pain in left ankle and joints of left foot: Secondary | ICD-10-CM | POA: Diagnosis not present

## 2018-05-16 DIAGNOSIS — R2689 Other abnormalities of gait and mobility: Secondary | ICD-10-CM | POA: Diagnosis not present

## 2018-05-16 DIAGNOSIS — R29898 Other symptoms and signs involving the musculoskeletal system: Secondary | ICD-10-CM | POA: Diagnosis not present

## 2018-05-16 NOTE — Therapy (Signed)
[place], [place], [address] Phone: [phone]   Fax:  [phone]  Physical Therapy Treatment  Patient Details  Name: Lori King MRN: [id number] Date of Birth: July 14, 1966 Referring Provider: Dr. Rod Can   Encounter Date: 05/16/2018  PT End of Session - 05/16/18 0942    Visit Number  9    Number of Visits  12    Date for PT Re-Evaluation  05/24/18    PT Start Time  0933    PT Stop Time  1572    PT Time Calculation (min)  61 min       History reviewed. No pertinent past medical history.  Past Surgical History:  Procedure Laterality Date  . BACK SURGERY    . CESAREAN SECTION    . TUBAL LIGATION      There were no vitals filed for this visit.  Subjective Assessment - 05/16/18 0934    Subjective  Pt reports she was limping due to location of patch on Lt ankle.  She believes her ROM is improving.  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     Patient Stated Goals  get ankle better and return to activites     Currently in Pain?  Yes    Pain Score  1     Pain Location  Ankle    Pain Orientation  Left    Pain Descriptors / Indicators  Aching;Dull    Aggravating Factors   too much walking, uneven ground    Pain Relieving Factors  ice, medication          [facility] PT Assessment - 05/16/18 0001      Assessment   Medical Diagnosis  Lt ankle sprain     Referring Provider  Dr. Rod Can    Onset Date/Surgical Date  04/06/18    Hand Dominance  Right    Next MD Visit  06/09/18    Prior Therapy  none      Precautions   Precautions  None      AROM   Left Ankle Dorsiflexion  10    Left Ankle Plantar Flexion  50    Left Ankle Inversion  18    Left Ankle Eversion  18        [facility] Adult PT Treatment/Exercise  - 05/16/18 0001      Iontophoresis   Type of Iontophoresis  --   unavailable; out of stock     Vasopneumatic   Number Minutes Vasopneumatic   15 minutes    Vasopnuematic Location   Ankle   Lt    Vasopneumatic Pressure  Medium    Vasopneumatic Temperature   34 deg      Manual Therapy   Manual therapy comments  I strip of reg Rock tape applied to Lt EDL at base of 2-4th toe up to mid-shin, then  2 - I strips crossing talus of Lt ankle to decompress tissue, decrease pain and provide support.     Joint Mobilization  Grades I-III talocrural joint mobs to decrease pain and increase ROM.    Soft tissue mobilization  STM to Lt dorsal surface of foot; IASTM to Lt extensor digitorum longus    Passive ROM  Lt ankle ROM in all directions, to tolerance.  Ankle Exercises: Seated   Ankle Circles/Pumps  AROM;Left;10 reps   CW/CCW   Heel Raises  Both;10 reps    BAPS  Sitting;Level 4;10 reps   12/6, 3/9, CW/CCW   Other Seated Ankle Exercises  Lt ankle inversion, eversion, DF with yellow band x 10 each direction       Ankle Exercises: Aerobic   Nustep  L4: 4.5 min    60-70 SPM     Ankle Exercises: Stretches   Soleus Stretch  2 reps;30 seconds   each leg, standing   Gastroc Stretch  2 reps;30 seconds   standing, runners stretch, each leg   Other Stretch  Lt hamstring stretch x 30 sec x 3 reps             PT Education - 05/16/18 0948    Education Details  recommended compression sock to decrease ankle swelling during day.  Educated on anatomy of foot in relation to where her pain is as well as structure healing parameters.    Person(s) Educated  Patient    Methods  Explanation    Comprehension  Verbalized understanding          PT Long Term Goals - 05/16/18 0958      PT LONG TERM GOAL #1   Title  Improve gait pattern with patient to demonstrate normal gait wihtou boot or brace to allow return to normal functional activities 05/24/18    Time  6    Period  Weeks    Status   Achieved      PT LONG TERM GOAL #2   Title  Increase Lt ankle ROM and strength to =/> than Rt ankle 05/24/18    Time  6    Period  Weeks    Status  On-going      PT LONG TERM GOAL #3   Title  Decrease Lt ankle pain by 75-90% allowing patient to return to normal functional activities without limitations 05/24/18    Time  6    Period  Weeks    Status  Achieved   80% reduction - 05/16/18     PT LONG TERM GOAL #4   Title  Independent in HEP 05/24/18    Time  6    Period  Weeks    Status  On-going      PT LONG TERM GOAL #5   Title  Improve FOTO to </= 40% limitation 05/24/18    Time  6    Period  Weeks    Status  On-going            Plan - 05/16/18 1035    Clinical Impression Statement  Pt's Lt ankle ROM similar to last assessment, however pt reporting improved functional mobility.  She tolerated yellow resistance ankle exercises with less discomfort.  Pt has met LTG 1 and 3 and is progressing well towards remaining goals.     Rehab Potential  Good    PT Frequency  2x / week    PT Duration  6 weeks    PT Treatment/Interventions  Patient/family education;ADLs/Self Care Home Management;Cryotherapy;Electrical Stimulation;Iontophoresis 65m/ml Dexamethasone;Moist Heat;Ultrasound;Dry needling;Manual techniques;Neuromuscular re-education;Gait training;Therapeutic activities;Therapeutic exercise;Balance training    PT Next Visit Plan  possible ionto patch.  continue progressive Lt ankle ROM, strengthening and balance.     Consulted and Agree with Plan of Care  Patient       Patient will benefit from skilled therapeutic intervention in order to improve the following deficits and impairments:  Postural dysfunction, Improper body mechanics, Pain, Increased fascial restricitons, Increased muscle spasms, Decreased mobility, Decreased range of motion, Abnormal gait, Decreased activity tolerance  Visit Diagnosis: Pain in left ankle and joints of left foot  Other symptoms and signs involving  the musculoskeletal system  Other abnormalities of gait and mobility     Problem List There are no active problems to display for this patient.  Kerin Perna, PTA 05/16/18 12:27 PM  [place], [place], [address] Phone: [phone]   Fax:  [phone]  Name: Lori King MRN: [id number] Date of Birth: 1966-03-06

## 2018-05-16 NOTE — Patient Instructions (Signed)
ANKLE: Eversion, Unilateral (Band)    Place band around left foot. Keeping heel in place, raise toes of banded foot up and away from body. Do not move hip. Hold _2-3__ seconds. Use ___yellow_____ band. __12_ reps per set, __1-2_ sets per day, _4__ days per week   [place] [facility] [place], [place] [address]  [phone] (office) [phone] (fax)

## 2018-05-23 ENCOUNTER — Ambulatory Visit: Payer: BLUE CROSS/BLUE SHIELD | Admitting: Physical Therapy

## 2018-05-23 DIAGNOSIS — R2689 Other abnormalities of gait and mobility: Secondary | ICD-10-CM | POA: Diagnosis not present

## 2018-05-23 DIAGNOSIS — R29898 Other symptoms and signs involving the musculoskeletal system: Secondary | ICD-10-CM | POA: Diagnosis not present

## 2018-05-23 DIAGNOSIS — M25572 Pain in left ankle and joints of left foot: Secondary | ICD-10-CM

## 2018-05-23 NOTE — Therapy (Signed)
[place], [place], [address] Phone: [phone]   Fax:  [phone]  Physical Therapy Treatment  Patient Details  Name: Lori King MRN: [id number] Date of Birth: [birth year] Referring Provider: Dr. Rod Can   Encounter Date: 05/23/2018  PT End of Session - 05/23/18 0938    Visit Number  10    Number of Visits  18    Date for PT Re-Evaluation  06/28/18    PT Start Time  0934    PT Stop Time  1029    PT Time Calculation (min)  55 min    Activity Tolerance  Patient tolerated treatment well;No increased pain    Behavior During Therapy  [facility] for tasks assessed/performed       No past medical history on file.  Past Surgical History:  Procedure Laterality Date  . BACK SURGERY    . CESAREAN SECTION    . TUBAL LIGATION      There were no vitals filed for this visit.  Subjective Assessment - 05/23/18 0938    Subjective  "Compression socks are my friend".  She received socks 4 days ago and has had relief of some symptoms.  She wore her brace while watering yard for 45 min 2 days ago. She has tried using rowing machine for 10 min and was fine during session, but was sore the following days.     Patient Stated Goals  get ankle better and return to activites     Currently in Pain?  Yes    Pain Score  1     Pain Location  Ankle    Pain Orientation  Left    Pain Descriptors / Indicators  Aching;Sore    Aggravating Factors   too much walking     Pain Relieving Factors  ice, medication, compression.          [facility] PT Assessment - 05/23/18 0001      Assessment   Medical Diagnosis  Lt ankle sprain     Referring Provider  Dr. Rod Can    Onset Date/Surgical Date  04/06/18    Hand Dominance  Right    Next MD Visit  06/09/18    Prior Therapy  none      Observation/Other Assessments   Focus on Therapeutic Outcomes (FOTO)   34% limited      ROM / Strength   AROM / PROM / Strength   Strength;AROM      AROM   AROM Assessment Site  Ankle    Right/Left Ankle  Left    Left Ankle Dorsiflexion  11    Left Ankle Plantar Flexion  55    Left Ankle Inversion  26    Left Ankle Eversion  25      Strength   Strength Assessment Site  Ankle    Right/Left Ankle  Left    Left Ankle Dorsiflexion  5/5    Left Ankle Plantar Flexion  4+/5   15 heel raises, with pain at end of completion   Left Ankle Inversion  4/5   with pain    Left Ankle Eversion  --   5-/5       [facility] Adult PT Treatment/Exercise - 05/23/18 0001      Iontophoresis   Type of Iontophoresis  Dexamethasone    Location  Lt ankle (talus, ant medial malleoli)     Dose  1.0 cc    Time  80 mA stat  patch, 6 hrs      Vasopneumatic   Number Minutes Vasopneumatic   15 minutes    Vasopnuematic Location   Ankle   Lt    Vasopneumatic Pressure  Medium    Vasopneumatic Temperature   34 deg      Ankle Exercises: Stretches   Soleus Stretch  30 seconds;3 reps , 2 sets  each leg, standing   Gastroc Stretch  30 seconds;3 reps   standing, runners stretch, each leg   Other Stretch  Lt hamstring stretch x 30 sec x 2 reps, quad stretch x 30 sec x 2      Ankle Exercises: Aerobic   Nustep  L5: 5.5 min    moderate pace.      Ankle Exercises: Standing   SLS  Rt/Lt SLS on blue pad x 30 sec, repeated with horiz head turns, occasional UE to stead.     Heel Raises  Right;20 reps;Left;15 reps    Heel Walk (Round Trip)  10 ft     Toe Walk (Round Trip)  10 ft    Other Standing Ankle Exercises  Forward step down, retro step up on 6" step with BUE support x 10 slow reps. Ankle circles in between exercises.        Ankle Exercises: Seated   Other Seated Ankle Exercises  Lt ankle inversion with red band x 10 reps             PT Education - 05/23/18 1019    Education Details  HEP    Person(s) Educated  Patient    Methods  Explanation;Handout    Comprehension  Verbalized understanding;Returned demonstration           PT Long Term Goals - 05/23/18 1014      PT LONG TERM GOAL #1   Title  Improve gait pattern with patient to demonstrate normal gait wihtou boot or brace to allow return to normal functional activities 05/24/18    Time  6    Period  Weeks    Status  Achieved      PT LONG TERM GOAL #2   Title  Increase Lt ankle ROM and strength to =/> than Rt ankle 06/28/18    Time  12    Period  Weeks    Status  Revised      PT LONG TERM GOAL #3   Title  Decrease Lt ankle pain by 75-90% allowing patient to return to normal functional activities without limitations 05/24/18    Time  6    Period  Weeks    Status  Achieved      PT LONG TERM GOAL #4   Title  Independent in HEP 06/28/18    Time  12    Period  Weeks    Status  On-going      PT LONG TERM GOAL #5   Title  Improve FOTO to </= 40% limitation 05/24/18    Time  6    Period  Weeks    Status  Achieved      PT LONG TERM GOAL #6   Title  Patient reports return to rowing 15-20 min at prior level of effort with minimal discomfort or soreness 06/28/18    Time  6    Period  Weeks    Status  New            Plan - 05/23/18 1016    Clinical Impression Statement  Pt has had positive response to  wearing compression socks daily; less report of pain and swelling.  Her strength and ROM in Lt ankle have improved; she has partially met LTG #2.  FOTO score improved to 37% limited.  Pt continues to have limitations with ROM/ strenght and would like to return to rowing regularly without ankle pain.  Pt will benefit from continued PT intervention to max functional mobility.     PT Treatment/Interventions  Patient/family education;ADLs/Self Care Home Management;Cryotherapy;Electrical Stimulation;Iontophoresis 24m/ml Dexamethasone;Moist Heat;Ultrasound;Dry needling;Manual techniques;Neuromuscular re-education;Gait training;Therapeutic activities;Therapeutic exercise;Balance training    PT Next Visit Plan  spoke to supervising PT; will request  additional visits from MD.     Consulted and Agree with Plan of Care  Patient       Patient will benefit from skilled therapeutic intervention in order to improve the following deficits and impairments:  Postural dysfunction, Improper body mechanics, Pain, Increased fascial restricitons, Increased muscle spasms, Decreased mobility, Decreased range of motion, Abnormal gait, Decreased activity tolerance  Visit Diagnosis: Pain in left ankle and joints of left foot - Plan: PT plan of care cert/re-cert  Other symptoms and signs involving the musculoskeletal system - Plan: PT plan of care cert/re-cert  Other abnormalities of gait and mobility - Plan: PT plan of care cert/re-cert     Problem List There are no active problems to display for this patient.  JKerin Perna PTA 05/23/18 10:31 AM   Celyn P. HHelene KelpPT, MPH 05/23/18 10:31 AM    [facility] [address]Phone: [phone]  Fax:  [phone] Name: Lori SCHMIEDERMRN: [id number] of Birth: [birth year]

## 2018-05-23 NOTE — Patient Instructions (Signed)
HIP: Hamstrings - Supine  Place strap around foot. Raise leg up, keeping knee straight.  Bend opposite knee to protect back if indicated. Hold 30 seconds. 3 reps per set, 2-3 sets per day  Quads / HF, Prone KNEE: Quadriceps - Prone    Place strap around ankle. Bring ankle toward buttocks. Press hip into surface. Hold 30 seconds. Repeat 3 times per session. Do 2-3 sessions per day.   [facility] [place], [place] [address]  [phone] (office) [phone] (fax)

## 2018-06-06 ENCOUNTER — Ambulatory Visit (INDEPENDENT_AMBULATORY_CARE_PROVIDER_SITE_OTHER): Payer: BLUE CROSS/BLUE SHIELD | Admitting: Physical Therapy

## 2018-06-06 DIAGNOSIS — R29898 Other symptoms and signs involving the musculoskeletal system: Secondary | ICD-10-CM

## 2018-06-06 DIAGNOSIS — M25572 Pain in left ankle and joints of left foot: Secondary | ICD-10-CM

## 2018-06-06 DIAGNOSIS — R2689 Other abnormalities of gait and mobility: Secondary | ICD-10-CM | POA: Diagnosis not present

## 2018-06-06 NOTE — Therapy (Addendum)
[place], [place], [address] Phone: [phone]   Fax:  [phone]  Physical Therapy Treatment  Patient Details  Name: Lori King MRN: [id number] Date of Birth: [birth year] Referring Provider (PT): Dr. Rod Can   Encounter Date: 06/06/2018  PT End of Session - 06/06/18 0936    Visit Number  11    Number of Visits  18    Date for PT Re-Evaluation  06/28/18    PT Start Time  0933    PT Stop Time  1029    PT Time Calculation (min)  56 min    Activity Tolerance  Patient limited by pain    Behavior During Therapy  [facility] for tasks assessed/performed       No past medical history on file.  Past Surgical History:  Procedure Laterality Date  . BACK SURGERY    . CESAREAN SECTION    . TUBAL LIGATION      There were no vitals filed for this visit.  Subjective Assessment - 06/06/18 0936    Subjective  Pt reports she was sore for 3 days after last session.  She complains she is still stiff and sore every day.   She reports her gait feels more natural.  She lacks confidence with descending stairs due to fear of falling down them again.      Patient Stated Goals  get ankle better and return to activites     Currently in Pain?  Yes   took pain med prior to therapy.    Pain Score  2     Pain Location  Ankle    Pain Orientation  Left    Pain Descriptors / Indicators  Sore    Aggravating Factors   stretch too much     Pain Relieving Factors  ice         [facility] PT Assessment - 06/06/18 0001      Assessment   Medical Diagnosis  Lt ankle sprain     Referring Provider (PT)  Dr. Aaron Edelman Swinteck    Onset Date/Surgical Date  04/06/18    Hand Dominance  Right    Next MD Visit  06/09/18    Prior Therapy  none      AROM   Left Ankle Dorsiflexion  11    Left Ankle Plantar Flexion  56   with discomfort at Achilles   Left Ankle Inversion  22   with pain at talus   Left Ankle Eversion  23    with pain at lateral ankle.      Strength   Left Ankle Dorsiflexion  5/5   with pain   Left Ankle Inversion  4/5   with pain    Left Ankle Eversion  --   5-/5      [facility] Adult PT Treatment/Exercise - 06/06/18 0001      Modalities   Modalities  Electrical Stimulation;Vasopneumatic      Vasopneumatic   Number Minutes Vasopneumatic   15 minutes    Vasopnuematic Location   Ankle   Lt    Vasopneumatic Pressure  Medium    Vasopneumatic Temperature   34 deg      Manual Therapy   Joint Mobilization  Grades I-III talocrural joint mobs to Lt ankle to decrease pain and increase ROM.  Joint distraction and calcaneal rocking into inversion/eversion. Gentle mobilization of rays.    Soft tissue mobilization  STM to Lt dorsal surface of foot;Lt  plantar fascia   Passive ROM  Lt ankle ROM in all directions, to tolerance.       Ankle Exercises: Stretches   Soleus Stretch  30 seconds;3 reps   each leg, standing   Gastroc Stretch  30 seconds;3 reps   standing, runners stretch, each leg     Ankle Exercises: Aerobic   Stationary Bike  L1-2: 5.5 min       Ankle Exercises: Seated   Ankle Circles/Pumps  AROM;Left;20 reps   CW/CCW   Other Seated Ankle Exercises  Lt ankle inversion, eversion, DF with red band x 10 reps             PT Education - 06/06/18 1017    Education Details  TENS     Person(s) Educated  Patient    Methods  Explanation    Comprehension  Verbalized understanding          PT Long Term Goals - 06/06/18 0939      PT LONG TERM GOAL #1   Title  Improve gait pattern with patient to demonstrate normal gait wihtou boot or brace to allow return to normal functional activities 05/24/18    Time  6    Period  Weeks    Status  Achieved      PT LONG TERM GOAL #2   Title  Increase Lt ankle ROM and strength to =/> than Rt ankle 06/28/18    Time  12    Period  Weeks    Status  Revised      PT LONG TERM GOAL #3   Title  Decrease Lt ankle pain by 75-90% allowing  patient to return to normal functional activities without limitations 05/24/18    Time  6    Period  Weeks    Status  Achieved      PT LONG TERM GOAL #4   Title  Independent in HEP 06/28/18    Time  12    Period  Weeks    Status  On-going      PT LONG TERM GOAL #5   Title  Improve FOTO to </= 40% limitation 05/24/18    Time  6    Period  Weeks    Status  Achieved      PT LONG TERM GOAL #6   Title  Patient reports return to rowing 15-20 min at prior level of effort with minimal discomfort or soreness 06/28/18    Time  6    Period  Weeks    Status  On-going   10 min with some increased discomfort           Plan - 06/06/18 1312    Clinical Impression Statement  Pt's Lt ankle ROM similar to last assessment 2 wks ago; with continued discomfort in all directions.  Exercise kept to seated to avoid further aggrivation of symptoms.  She has partially met her goals and requests to hold therapy until she returns to MD.     Rehab Potential  Good    PT Frequency  2x / week    PT Duration  6 weeks    PT Treatment/Interventions  Patient/family education;ADLs/Self Care Home Management;Cryotherapy;Electrical Stimulation;Iontophoresis 60m/ml Dexamethasone;Moist Heat;Ultrasound;Dry needling;Manual techniques;Neuromuscular re-education;Gait training;Therapeutic activities;Therapeutic exercise;Balance training    PT Next Visit Plan  will hold therapy and await further advisement.     Consulted and Agree with Plan of Care  Patient       Patient will benefit from skilled therapeutic intervention in  order to improve the following deficits and impairments:  Postural dysfunction, Improper body mechanics, Pain, Increased fascial restricitons, Increased muscle spasms, Decreased mobility, Decreased range of motion, Abnormal gait, Decreased activity tolerance  Visit Diagnosis: Pain in left ankle and joints of left foot  Other symptoms and signs involving the musculoskeletal system  Other  abnormalities of gait and mobility     Problem List There are no active problems to display for this patient.  Kerin Perna, PTA 06/06/18 1:17 PM  [facility] [place], [place], [address] Phone: [phone]   Fax:  [phone]  Name: Lori King MRN: [id number] Date of Birth: Jul 20, 1966  PHYSICAL THERAPY DISCHARGE SUMMARY  Visits from Start of Care: 8  Current functional level related to goals / functional outcomes: See progress note for discharge status   Remaining deficits: Good gains but continued pain - RTD    Education / Equipment: HEP  Plan: Patient agrees to discharge.  Patient goals were partially met. Patient is being discharged due to not returning since the last visit.  ?????     Celyn P. Helene Kelp PT, MPH 06/30/18 11:13 AM

## 2018-06-06 NOTE — Patient Instructions (Signed)
TENS UNIT: This is helpful for muscle pain and spasm.   Search and Purchase a TENS 7000 2nd edition at [employer] It should be less than $30.     TENS unit instructions: Do not shower or bathe with the unit on Turn the unit off before removing electrodes or batteries If the electrodes lose stickiness add a drop of water to the electrodes after they are disconnected from the unit and place on plastic sheet. If you continued to have difficulty, call the TENS unit company to purchase more electrodes. Do not apply lotion on the skin area prior to use. Make sure the skin is clean and dry as this will help prolong the life of the electrodes. After use, always check skin for unusual red areas, rash or other skin difficulties. If there are any skin problems, does not apply electrodes to the same area. Never remove the electrodes from the unit by pulling the wires. Do not use the TENS unit or electrodes other than as directed. Do not change electrode placement without consultating your therapist or physician. Keep 2 fingers with between each electrode. Wear time ratio is 2:1, on to off times.    For example on for 30 minutes off for 15 minutes and then on for 30 minutes off for 15 minutes

## 2019-01-10 ENCOUNTER — Other Ambulatory Visit: Payer: Self-pay | Admitting: Family Medicine

## 2019-01-10 DIAGNOSIS — Z1231 Encounter for screening mammogram for malignant neoplasm of breast: Secondary | ICD-10-CM

## 2019-02-05 ENCOUNTER — Other Ambulatory Visit: Payer: Self-pay | Admitting: Obstetrics and Gynecology

## 2019-02-05 DIAGNOSIS — K589 Irritable bowel syndrome without diarrhea: Secondary | ICD-10-CM | POA: Insufficient documentation

## 2019-02-05 DIAGNOSIS — N6002 Solitary cyst of left breast: Secondary | ICD-10-CM

## 2019-02-07 ENCOUNTER — Ambulatory Visit
Admission: RE | Admit: 2019-02-07 | Discharge: 2019-02-07 | Disposition: A | Discharge status: Home / Self Care | Payer: BLUE CROSS/BLUE SHIELD | Source: Ambulatory Visit | Attending: Obstetrics and Gynecology | Admitting: Obstetrics and Gynecology

## 2019-02-07 ENCOUNTER — Other Ambulatory Visit: Payer: Self-pay

## 2019-02-07 DIAGNOSIS — N6002 Solitary cyst of left breast: Secondary | ICD-10-CM

## 2019-03-07 ENCOUNTER — Ambulatory Visit: Payer: BLUE CROSS/BLUE SHIELD

## 2019-06-13 ENCOUNTER — Other Ambulatory Visit: Payer: Self-pay | Admitting: Obstetrics and Gynecology

## 2019-06-13 DIAGNOSIS — Z803 Family history of malignant neoplasm of breast: Secondary | ICD-10-CM

## 2019-08-10 ENCOUNTER — Other Ambulatory Visit: Payer: BC Managed Care – PPO

## 2019-08-10 ENCOUNTER — Ambulatory Visit
Admission: RE | Admit: 2019-08-10 | Discharge: 2019-08-10 | Disposition: A | Discharge status: Home / Self Care | Payer: BC Managed Care – PPO | Source: Ambulatory Visit | Attending: Obstetrics and Gynecology | Admitting: Obstetrics and Gynecology

## 2019-08-10 DIAGNOSIS — Z803 Family history of malignant neoplasm of breast: Secondary | ICD-10-CM

## 2019-08-10 MED ORDER — GADOBUTROL 1 MMOL/ML IV SOLN
6.0000 mL | Freq: Once | INTRAVENOUS | Status: AC | PRN
  Administered 2019-08-10: 6 mL via INTRAVENOUS

## 2019-10-31 ENCOUNTER — Other Ambulatory Visit: Payer: Self-pay

## 2019-10-31 ENCOUNTER — Encounter: Payer: Self-pay | Admitting: Physical Therapy

## 2019-10-31 ENCOUNTER — Ambulatory Visit (INDEPENDENT_AMBULATORY_CARE_PROVIDER_SITE_OTHER): Payer: BC Managed Care – PPO | Admitting: Physical Therapy

## 2019-10-31 DIAGNOSIS — R29898 Other symptoms and signs involving the musculoskeletal system: Secondary | ICD-10-CM | POA: Diagnosis not present

## 2019-10-31 DIAGNOSIS — M25511 Pain in right shoulder: Secondary | ICD-10-CM | POA: Diagnosis not present

## 2019-10-31 DIAGNOSIS — M6281 Muscle weakness (generalized): Secondary | ICD-10-CM | POA: Diagnosis not present

## 2019-10-31 NOTE — Therapy (Signed)
Lori King, [place], [address] Phone: [phone]   Fax:  [phone]  Physical Therapy Evaluation  Patient Details  Name: Lori King MRN: [id number] Date of Birth: [birth year] Referring Provider (PT): Dr Victorino December   Encounter Date: 10/31/2019  PT End of Session - 10/31/19 1103    Visit Number  1    Number of Visits  12    Date for PT Re-Evaluation  12/12/19    Authorization Type  BCBS    PT Start Time  1104    PT Stop Time  1147    PT Time Calculation (min)  43 min    Activity Tolerance  Patient tolerated treatment well    Behavior During Therapy  [facility] for tasks assessed/performed       History reviewed. No pertinent past medical history.  Past Surgical History:  Procedure Laterality Date  . BACK SURGERY    . BREAST BIOPSY Left   . CESAREAN SECTION    . TUBAL LIGATION      There were no vitals filed for this visit.   Subjective Assessment - 10/31/19 1104    Subjective  Pt reports she injured her Rt shoulder a long time ago while rowing, it resolved on its own.  This summer she did a lot of gardening and started rowing again.  In Sept the pain became worse and she went to the doctor in Dec.  She had one cortisone injection - took away most of the deltoid pain    Currently in Pain?  Yes    Pain Score  4    up to 5/10 pain with her exercise.   Pain Location  Shoulder    Pain Orientation  Right    Pain Descriptors / Indicators  Aching;Dull    Pain Type  Chronic pain    Pain Onset  More than a month ago    Pain Frequency  Intermittent    Aggravating Factors   reaching out to side, elevation in front    Pain Relieving Factors  ice and ibuprofen - trying not to take as much         [facility] PT Assessment - 10/31/19 0001      Assessment   Medical Diagnosis  Rt shoulder impingement    Referring Provider (PT)  Dr Victorino December    Onset Date/Surgical Date  07/03/19    Hand Dominance   Right    Next MD Visit  11/23/2019    Prior Therapy  not for the shoulder      Precautions   Precaution Comments  hold on rowing machine for now      Balance Screen   Has the patient fallen in the past 6 months  No    Has the patient had a decrease in activity level because of a fear of falling?   No    Is the patient reluctant to leave their home because of a fear of falling?   No      Prior Function   Level of Independence  Independent    Leisure  exercise      Posture/Postural Control   Posture/Postural Control  Postural limitations    Postural Limitations  Rounded Shoulders;Forward head      ROM / Strength   AROM / PROM / Strength  AROM;Strength      AROM   AROM Assessment Site  Shoulder;Cervical;Elbow    Right/Left Shoulder  --  Bilat WNL   Cervical Flexion  WNL some tightness Rt upper trap area    Cervical Extension  WNL    Cervical - Right Rotation  60 with upper shoulder pain    Cervical - Left Rotation  75      Strength   Overall Strength Comments  Rt upper back 4/5, Lt 5/5    Strength Assessment Site  Shoulder;Elbow    Right/Left Shoulder  --   WNL - pain with flex/abdcution and IR   Right/Left Elbow  --   bilat WNL     Palpation   Palpation comment  tight and tender in Rt distal deltoid, pecs , upper trap . Good Rt GH mobility pain and tenderness in Rt sternoclavicular Jt.       Special Tests    Special Tests  --    Rotator Cuff Impingment tests  Painful Arc of Motion;Hawkins- Kennedy test      Hawkins-Kennedy test   Findings  Positive    Side  Right      Painful Arc of Motion   Findings  Positive    Side  Right             Katina Dung - 10/31/19 0001    Open a tight or new jar  Moderate difficulty    Do heavy household chores (wash walls, wash floors)  Moderate difficulty    Carry a shopping bag or briefcase  Moderate difficulty    Wash your back  No difficulty    Use a knife to cut food  Mild difficulty    Recreational activities in  which you take some force or impact through your arm, shoulder, or hand (golf, hammering, tennis)  Severe difficulty    During the past week, to what extent has your arm, shoulder or hand problem interfered with your normal social activities with family, friends, neighbors, or groups?  Not at all    During the past week, to what extent has your arm, shoulder or hand problem limited your work or other regular daily activities  Modererately    Arm, shoulder, or hand pain.  Moderate    Tingling (pins and needles) in your arm, shoulder, or hand  None    Difficulty Sleeping  Mild difficulty    DASH Score  34.09 %        Objective measurements completed on examination: See above findings.      [facility] Adult PT Treatment/Exercise - 10/31/19 0001      Exercises   Exercises  Shoulder      Shoulder Exercises: Standing   External Rotation  Strengthening;Right;Theraband   towel under arm   Theraband Level (Shoulder External Rotation)  Level 1 (Yellow)    Internal Rotation  Strengthening;Right;10 reps;Theraband   towel under arm   Theraband Level (Shoulder Internal Rotation)  Level 1 (Yellow)    Row  Strengthening;Both    Row Limitations  reviewed what she is doing at home with arms straight and elbows bent.       Shoulder Exercises: Stretch   Other Shoulder Stretches  single arm doorway stretch mid       Modalities   Modalities  Iontophoresis      Iontophoresis   Type of Iontophoresis  Dexamethasone    Location  Rt sternoclavicular jt    Dose  1.0cc    Time  8 hr stat patch             PT Education - 10/31/19 1148  Education Details  POC, HEP, DN and ionto    Person(s) Educated  Patient    Methods  Explanation;Demonstration;Handout    Comprehension  Returned demonstration;Verbalized understanding          PT Long Term Goals - 10/31/19 1202      PT LONG TERM GOAL #1   Title  I with advanced HEP to include return to rowing (12/12/2019)    Time  6    Period  Weeks     Status  New    Target Date  12/12/19      PT LONG TERM GOAL #2   Title  improve  Rt upper back strength to = Lt with no pain on resistance ( 12/12/2019)    Time  6    Period  Weeks    Status  New    Target Date  12/12/19      PT LONG TERM GOAL #3   Title  improve quick DASH score =/< 15 ( 12/12/2019)    Time  6    Period  Weeks    Status  New    Target Date  12/12/19      PT LONG TERM GOAL #4   Title  improve Rt cervical rotation =/> 70 degrees without pain ( 12/12/2019)    Time  6    Period  Weeks    Status  New    Target Date  12/12/19      PT LONG TERM GOAL #5   Title  demo painfree and full Rt shoulder ROM to allow return to normal activities and sleep ( 12/12/2019)    Time  6    Period  Weeks    Status  New    Target Date  12/12/19             Plan - 10/31/19 1155    Clinical Impression Statement  54 yo female with progressive Rt shoulder pain that is now interfering with her ability to sleep, exercise and perform her normal activity.  She has recieved one cortisone injection with some relief and has been doing some band exercise at home that her [place] issued her.  She reports increased shoulder pain after performing them. Currently she has full shoulder ROM with pain at endrange of flexion and abduction.  Pain with resistance of those movements as well.  Limited and painful Rt cervical rotation, weakness in her upper back, muscular tightness with tenderness in the deltoid, upper trap and pecs.  She is painful at the Rt sternoclavicular joint.She would benefit from PT to reduce pain, improve strength and restore PLOF to include return to rowing exercise.    Personal Factors and Comorbidities  Comorbidity 2    Comorbidities  back surgery , breast biopsy    Examination-Activity Limitations  Reach Overhead;Sleep    Examination-Participation Restrictions  Yard Work    Stability/Clinical Decision Making  Stable/Uncomplicated    Clinical Decision Making  Low     Rehab Potential  Excellent    PT Frequency  2x / week    PT Duration  6 weeks    PT Treatment/Interventions  Iontophoresis 4mg /ml Dexamethasone;Taping;Vasopneumatic Device;Patient/family education;Moist Heat;Passive range of motion;Therapeutic activities;Ultrasound;Therapeutic exercise;Cryotherapy;Electrical Stimulation;Manual techniques;Dry needling    PT Next Visit Plan  DN, Rt upper trap, deltoid, possible scalenes, scapular stability , cont ionto    Consulted and Agree with Plan of Care  Patient       Patient will benefit from skilled therapeutic intervention in order  to improve the following deficits and impairments:  Decreased range of motion, Impaired UE functional use, Pain, Decreased strength, Increased edema  Visit Diagnosis: Acute pain of right shoulder - Plan: PT plan of care cert/re-cert  Other symptoms and signs involving the musculoskeletal system - Plan: PT plan of care cert/re-cert  Muscle weakness (generalized) - Plan: PT plan of care cert/re-cert     Problem List There are no problems to display for this patient.   Jeral Pinch PT  10/31/2019, 12:07 PM  [facility] [place], [place], [address] Phone: [phone]   Fax:  [phone]  Name: ZULMY BETTCHER MRN: [id number] Date of Birth: 1966-01-09

## 2019-10-31 NOTE — Patient Instructions (Addendum)
IONTOPHORESIS PATIENT PRECAUTIONS & CONTRAINDICATIONS:  . Redness under one or both electrodes can occur.  This characterized by a uniform redness that usually disappears within 12 hours of treatment. . Small pinhead size blisters may result in response to the drug.  Contact your physician if the problem persists more than 24 hours. . On rare occasions, iontophoresis therapy can result in temporary skin reactions such as rash, inflammation, irritation or burns.  The skin reactions may be the result of individual sensitivity to the ionic solution used, the condition of the skin at the start of treatment, reaction to the materials in the electrodes, allergies or sensitivity to dexamethasone, or a poor connection between the patch and your skin.  Discontinue using iontophoresis if you have any of these reactions and report to your therapist. . Remove the Patch or electrodes if you have any undue sensation of pain or burning during the treatment and report discomfort to your therapist. . Tell your Therapist if you have had known adverse reactions to the application of electrical current. . If using the Patch, the LED light will turn off when treatment is complete and the patch can be removed.  Approximate treatment time is 1-3 hours.  Remove the patch when light goes off or after 6 hours. . The Patch can be worn during normal activity, however excessive motion where the electrodes have been placed can cause poor contact between the skin and the electrode or uneven electrical current resulting in greater risk of skin irritation. . Keep out of the reach of children.   . DO NOT use if you have a cardiac pacemaker or any other electrically sensitive implanted device. . DO NOT use if you have a known sensitivity to dexamethasone. . DO NOT use during Magnetic Resonance Imaging (MRI). . DO NOT use over broken or compromised skin (e.g. sunburn, cuts, or acne) due to the increased risk of skin reaction. . DO  NOT SHAVE over the area to be treated:  To establish good contact between the Patch and the skin, excessive hair may be clipped. . DO NOT place the Patch or electrodes on or over your eyes, directly over your heart, or brain. . DO NOT reuse the Patch or electrodes as this may cause burns to occur.  Trigger Point Dry Needling  . What is Trigger Point Dry Needling (DN)? o DN is a physical therapy technique used to treat muscle pain and dysfunction. Specifically, DN helps deactivate muscle trigger points (muscle knots).  o A thin filiform needle is used to penetrate the skin and stimulate the underlying trigger point. The goal is for a local twitch response (LTR) to occur and for the trigger point to relax. No medication of any kind is injected during the procedure.   . What Does Trigger Point Dry Needling Feel Like?  o The procedure feels different for each individual patient. Some patients report that they do not actually feel the needle enter the skin and overall the process is not painful. Very mild bleeding may occur. However, many patients feel a deep cramping in the muscle in which the needle was inserted. This is the local twitch response.   . How Will I feel after the treatment? o Soreness is normal, and the onset of soreness may not occur for a few hours. Typically this soreness does not last longer than two days.  o Bruising is uncommon, however; ice can be used to decrease any possible bruising.  o In rare cases feeling tired or   after the treatment is normal. In addition, your symptoms may get worse before they get better, this period will typically not last longer than 24 hours.   . What Can I do After My Treatment? o Increase your hydration by drinking more water for the next 24 hours. o You may place ice or heat on the areas treated that have become sore, however, do not use heat on inflamed or bruised areas. Heat often brings more relief post needling. o You can continue  your regular activities, but vigorous activity is not recommended initially after the treatment for 24 hours. o DN is best combined with other physical therapy such as strengthening, stretching, and other therapies.  Access Code: [email or web address]  URL: [email or web address]  Date: 10/31/2019  Prepared by: Jeral Pinch   Exercises Standing Shoulder Row with Anchored Resistance - 10 reps - 1-3 sets - 1x daily Shoulder Extension with Resistance - 10 reps - 1-3 sets - 1x daily Doorway Pec Stretch at 90 Degrees Abduction - 2-3 reps - 30 hold - 1x daily Shoulder External Rotation Reactive Isometrics - 10 reps - 1-3 sets - 1x daily Standing Shoulder Internal Rotation with Anchored Resistance - 10 reps - 1-3 sets - 1x daily

## 2019-11-02 ENCOUNTER — Other Ambulatory Visit: Payer: Self-pay

## 2019-11-02 ENCOUNTER — Encounter: Payer: Self-pay | Admitting: Physical Therapy

## 2019-11-02 ENCOUNTER — Ambulatory Visit: Payer: BC Managed Care – PPO | Admitting: Physical Therapy

## 2019-11-02 DIAGNOSIS — M25511 Pain in right shoulder: Secondary | ICD-10-CM | POA: Diagnosis not present

## 2019-11-02 DIAGNOSIS — R29898 Other symptoms and signs involving the musculoskeletal system: Secondary | ICD-10-CM | POA: Diagnosis not present

## 2019-11-02 DIAGNOSIS — M6281 Muscle weakness (generalized): Secondary | ICD-10-CM | POA: Diagnosis not present

## 2019-11-02 NOTE — Therapy (Signed)
[place], [place], [address] Phone: [phone]   Fax:  [phone]  Physical Therapy Treatment  Patient Details  Name: Lori King MRN: [id number] Date of Birth: 05-Oct-1965 Referring Provider (PT): Dr Victorino December   Encounter Date: 11/02/2019  PT End of Session - 11/02/19 1147    Visit Number  2    Number of Visits  12    Date for PT Re-Evaluation  12/12/19    Authorization Type  BCBS    PT Start Time  1147    PT Stop Time  1244    PT Time Calculation (min)  57 min    Activity Tolerance  Patient tolerated treatment well    Behavior During Therapy  [facility] for tasks assessed/performed       History reviewed. No pertinent past medical history.  Past Surgical History:  Procedure Laterality Date  . BACK SURGERY    . BREAST BIOPSY Left   . CESAREAN SECTION    . TUBAL LIGATION      There were no vitals filed for this visit.  Subjective Assessment - 11/02/19 1148    Subjective  Did well with ionto, a little red skin however not bad, Thinks it helped. Not feeling to bad today.    Currently in Pain?  No/denies         [facility] PT Assessment - 11/02/19 0001      Assessment   Medical Diagnosis  Rt shoulder impingement                   [facility] Adult PT Treatment/Exercise - 11/02/19 0001      Shoulder Exercises: ROM/Strengthening   UBE (Upper Arm Bike)  L1x4' alt FWD/BWD  Cervical side bend stretching, scalene and upper trap/levator stretches     Modalities   Modalities  Iontophoresis;Moist Heat      Moist Heat Therapy   Number Minutes Moist Heat  12 Minutes    Moist Heat Location  Cervical;Shoulder      Iontophoresis   Type of Iontophoresis  Dexamethasone    Location  Rt sternoclavicular jt    Dose  1.0cc    Time  8 hr stat patch      Manual Therapy   Manual Therapy  Soft tissue mobilization;Taping    Manual therapy comments  skilled palpation and monitoring during  DN    Soft tissue mobilization  STM to Rt deltoid, bicep and upper trap/levator    [employer];Inhibit Muscle      [employer]   Create Space  Y strip to Rt deltoid    Inhibit Muscle   I strip to Rt upper trap       Trigger Point Dry Needling - 11/02/19 0001    Consent Given?  Yes    Education Handout Provided  Yes    Muscles Treated Head and Neck  Upper trapezius    Muscles Treated Upper Quadrant  Deltoid    Electrical Stimulation Performed with Dry Needling  Yes    Other Dry Needling  Rt deltoid    Upper Trapezius Response  Palpable increased muscle length;Twitch reponse elicited   Rt with multiple releases   Deltoid Response  Twitch response elicited;Palpable increased muscle length                PT Long Term Goals - 10/31/19 1202      PT LONG TERM GOAL #1   Title  I with  advanced HEP to include return to rowing (12/12/2019)    Time  6    Period  Weeks    Status  New    Target Date  12/12/19      PT LONG TERM GOAL #2   Title  improve  Rt upper back strength to = Lt with no pain on resistance ( 12/12/2019)    Time  6    Period  Weeks    Status  New    Target Date  12/12/19      PT LONG TERM GOAL #3   Title  improve quick DASH score =/< 15 ( 12/12/2019)    Time  6    Period  Weeks    Status  New    Target Date  12/12/19      PT LONG TERM GOAL #4   Title  improve Rt cervical rotation =/> 70 degrees without pain ( 12/12/2019)    Time  6    Period  Weeks    Status  New    Target Date  12/12/19      PT LONG TERM GOAL #5   Title  demo painfree and full Rt shoulder ROM to allow return to normal activities and sleep ( 12/12/2019)    Time  6    Period  Weeks    Status  New    Target Date  12/12/19            Plan - 11/02/19 1236    Clinical Impression Statement  Lori King had multiple releases in the Rt upper trap and some in the deltoid.  Tape at end of tx helped decrease some of her soreness.  This is only her second visit, no goals met.  Continue with initial HEP    Rehab Potential  Excellent    PT Frequency  2x / week    PT Duration  6 weeks    PT Treatment/Interventions  Iontophoresis 8m/ml Dexamethasone    PT Next Visit Plan  assess response to DN, cont ionto and progress HEP    Consulted and Agree with Plan of Care  Patient       Patient will benefit from skilled therapeutic intervention in order to improve the following deficits and impairments:  Decreased range of motion, Impaired UE functional use, Pain, Decreased strength, Increased edema  Visit Diagnosis: Acute pain of right shoulder  Other symptoms and signs involving the musculoskeletal system  Muscle weakness (generalized)     Problem List There are no problems to display for this patient.   SJeral PinchPT  11/02/2019, 12:42 PM  [facility] [address]Phone: [phone]  Fax:  [phone] Name: Lori SHAWNMRN: [id number] of Birth: 3April 12, 1967

## 2019-11-07 ENCOUNTER — Ambulatory Visit: Payer: BC Managed Care – PPO | Admitting: Physical Therapy

## 2019-11-07 ENCOUNTER — Other Ambulatory Visit: Payer: Self-pay

## 2019-11-07 DIAGNOSIS — R29898 Other symptoms and signs involving the musculoskeletal system: Secondary | ICD-10-CM

## 2019-11-07 DIAGNOSIS — M6281 Muscle weakness (generalized): Secondary | ICD-10-CM | POA: Diagnosis not present

## 2019-11-07 DIAGNOSIS — M25511 Pain in right shoulder: Secondary | ICD-10-CM | POA: Diagnosis not present

## 2019-11-07 NOTE — Therapy (Signed)
[place], [place], [address] Phone: [phone]   Fax:  [phone]  Physical Therapy Treatment  Patient Details  Name: Lori King MRN: [id number] Date of Birth: 11-Jun-1966 Referring Provider (PT): Dr Victorino December   Encounter Date: 11/07/2019  PT End of Session - 11/07/19 1020    Visit Number  3    Number of Visits  12    Date for PT Re-Evaluation  12/12/19    Authorization Type  BCBS    PT Start Time  1021    PT Stop Time  1110    PT Time Calculation (min)  49 min    Activity Tolerance  Patient tolerated treatment well       No past medical history on file.  Past Surgical History:  Procedure Laterality Date  . BACK SURGERY    . BREAST BIOPSY Left   . CESAREAN SECTION    . TUBAL LIGATION      There were no vitals filed for this visit.  Subjective Assessment - 11/07/19 1022    Subjective  Pt reports taht the next day her shoulder felt so light and good.  Now having tightness in the Rt upper trap and distal deltoid    Currently in Pain?  Yes    Pain Score  4     Pain Location  Shoulder    Pain Orientation  Right    Pain Descriptors / Indicators  Spasm;Tightness    Pain Type  Chronic pain         [facility] PT Assessment - 11/07/19 0001      Assessment   Medical Diagnosis  Rt shoulder impingement                   [facility] Adult PT Treatment/Exercise - 11/07/19 0001      Shoulder Exercises: Sidelying   External Rotation  Strengthening;Right;20 reps;Weights   towel under arm   External Rotation Weight (lbs)  2    Internal Rotation  Strengthening;Right;Weights   3x10   Internal Rotation Weight (lbs)  2    Other Sidelying Exercises  3x10 empty can in limited ROM Rt wiht2#      Shoulder Exercises: ROM/Strengthening   UBE (Upper Arm Bike)  L1x4' alt FWD/BWD      Modalities   Modalities  Iontophoresis;Moist Heat      Moist Heat Therapy   Number Minutes Moist  Heat  12 Minutes    Moist Heat Location  Cervical;Shoulder      Iontophoresis   Type of Iontophoresis  Dexamethasone    Location  Rt sternoclavicular jt    Dose  1.0cc    Time  8 hr stat patch      Manual Therapy   Manual therapy comments  skilled palpation and monitoring during DN    Soft tissue mobilization  STM to Rt deltoid, bicep and upper trap/levator    [employer];Inhibit Muscle      [employer]   Create Space  Y strip to Rt deltoid    Inhibit Muscle   I strip to Rt upper trap       Trigger Point Dry Needling - 11/07/19 0001    Consent Given?  Yes    Education Handout Provided  Previously provided    Muscles Treated Head and Neck  Upper trapezius    Muscles Treated Upper Quadrant  Deltoid    Dry Needling Comments  supine  Electrical Stimulation Performed with Dry Needling  Yes    E-stim with Dry Needling Details  deltoid    Upper Trapezius Response  Palpable increased muscle length;Twitch reponse elicited    Deltoid Response  Palpable increased muscle length;Twitch response elicited                PT Long Term Goals - 10/31/19 1202      PT LONG TERM GOAL #1   Title  I with advanced HEP to include return to rowing (12/12/2019)    Time  6    Period  Weeks    Status  New    Target Date  12/12/19      PT LONG TERM GOAL #2   Title  improve  Rt upper back strength to = Lt with no pain on resistance ( 12/12/2019)    Time  6    Period  Weeks    Status  New    Target Date  12/12/19      PT LONG TERM GOAL #3   Title  improve quick DASH score =/< 15 ( 12/12/2019)    Time  6    Period  Weeks    Status  New    Target Date  12/12/19      PT LONG TERM GOAL #4   Title  improve Rt cervical rotation =/> 70 degrees without pain ( 12/12/2019)    Time  6    Period  Weeks    Status  New    Target Date  12/12/19      PT LONG TERM GOAL #5   Title  demo painfree and full Rt shoulder ROM to allow return to normal activities and sleep ( 12/12/2019)     Time  6    Period  Weeks    Status  New    Target Date  12/12/19            Plan - 11/07/19 1152    Clinical Impression Statement  [name] is getting good relief with DN and taping.  She reports less pain in the shoulder.  Will need more scapula and RTC strengthening and possible progress HEP at next visit.  Making progress to goals.    Rehab Potential  Excellent    PT Frequency  2x / week    PT Duration  6 weeks    PT Treatment/Interventions  Iontophoresis 4mg /ml Dexamethasone;Electrical Stimulation;Cryotherapy;Ultrasound;Moist Heat;Neuromuscular re-education;Therapeutic exercise;Vasopneumatic Device;Joint Manipulations;Passive range of motion;Dry needling;Taping    PT Next Visit Plan  assess response to DN, cont ionto and progress HEP       Patient will benefit from skilled therapeutic intervention in order to improve the following deficits and impairments:  Decreased range of motion, Impaired UE functional use, Pain, Decreased strength, Increased edema  Visit Diagnosis: Acute pain of right shoulder  Other symptoms and signs involving the musculoskeletal system  Muscle weakness (generalized)     Problem List There are no problems to display for this patient.   Jeral Pinch P T 11/07/2019, 11:54 AM  [facility] [place], [place], [address] Phone: [phone]   Fax:  [phone]  Name: KANDIE TAMAS MRN: [id number] Date of Birth: March 14, 1966

## 2019-11-09 ENCOUNTER — Encounter: Payer: BC Managed Care – PPO | Admitting: Physical Therapy

## 2019-11-12 ENCOUNTER — Encounter: Payer: Self-pay | Admitting: Rehabilitative and Restorative Service Providers"

## 2019-11-12 ENCOUNTER — Ambulatory Visit: Payer: BC Managed Care – PPO | Admitting: Rehabilitative and Restorative Service Providers"

## 2019-11-12 ENCOUNTER — Other Ambulatory Visit: Payer: Self-pay

## 2019-11-12 DIAGNOSIS — R29898 Other symptoms and signs involving the musculoskeletal system: Secondary | ICD-10-CM

## 2019-11-12 DIAGNOSIS — M6281 Muscle weakness (generalized): Secondary | ICD-10-CM | POA: Diagnosis not present

## 2019-11-12 DIAGNOSIS — M25511 Pain in right shoulder: Secondary | ICD-10-CM

## 2019-11-12 NOTE — Patient Instructions (Signed)
Scapula Adduction With Pectoralis Stretch: Low - Standing   Shoulders at 45 hands even with shoulders, keeping weight through legs, shift weight forward until you feel pull or stretch through the front of your chest. Hold _30__ seconds. Do _3__ times, _2-4__ times per day.   Scapula Adduction With Pectoralis Stretch: Mid-Range - Standing   Shoulders at 90 elbows even with shoulders, keeping weight through legs, shift weight forward until you feel pull or strength through the front of your chest. Hold __30_ seconds. Do _3__ times, __2-4_ times per day.   Scapula Adduction With Pectoralis Stretch: High - Standing   Shoulders at 120 hands up high on the doorway, keeping weight on feet, shift weight forward until you feel pull or stretch through the front of your chest. Hold _30__ seconds. Do _3__ times, _2-3__ times per day.   Shoulder Blade Squeeze    Rotate shoulders back, then squeeze shoulder blades down and back Hold 10 sec . Repeat __5-10 _ times. Do ___2-3_ sessions per day.  Marland KitchenAxial Extension (Chin Tuck)    Pull chin in and lengthen back of neck. Hold __5-10 __ seconds while counting out loud. Repeat __5__ times. Do __several__ sessions per day.

## 2019-11-12 NOTE — Therapy (Signed)
[place], [place], [address] Phone: [phone]   Fax:  [phone]  Physical Therapy Treatment  Patient Details  Name: Lori King MRN: [id number] Date of Birth: 10-05-1965 Referring Provider (PT): Dr Victorino December   Encounter Date: 11/12/2019  PT End of Session - 11/12/19 1022    Visit Number  4    Number of Visits  12    Date for PT Re-Evaluation  12/12/19    Authorization Type  BCBS    PT Start Time  1016    PT Stop Time  1105   moist heat end of treatment   PT Time Calculation (min)  49 min    Activity Tolerance  Patient tolerated treatment well       History reviewed. No pertinent past medical history.  Past Surgical History:  Procedure Laterality Date  . BACK SURGERY    . BREAST BIOPSY Left   . CESAREAN SECTION    . TUBAL LIGATION      There were no vitals filed for this visit.  Subjective Assessment - 11/12/19 1017    Subjective  Patient reports that the DN was helpful and would like to repeat the DN today. She has some soreness today - forgot to take antiinflammatory yesterday. Busy weekend and can feel it today.    Currently in Pain?  Yes    Pain Score  5     Pain Location  Shoulder    Pain Orientation  Right    Pain Descriptors / Indicators  Tightness;Sore    Pain Type  Chronic pain    Pain Onset  More than a month ago    Pain Frequency  Intermittent                       [facility] Adult PT Treatment/Exercise - 11/12/19 0001      Shoulder Exercises: Standing   Other Standing Exercises  axial extension 5 sec x 5; scap squeeze 5 sec x 5 with noodle       Shoulder Exercises: Stretch   Other Shoulder Stretches  three way doorway stretch 30 sec x 2 each position       Moist Heat Therapy   Number Minutes Moist Heat  10 Minutes    Moist Heat Location  Cervical;Shoulder      Iontophoresis   Type of Iontophoresis  Dexamethasone    Location  Rt  sternoclavicular jt    Dose  120 mAmp dose - 1 cc     Time  10 hour       Manual Therapy   Manual therapy comments  skilled palpation and monitoring during DN    Soft tissue mobilization  STM to Rt deltoid, bicep and upper trap/levator       Trigger Point Dry Needling - 11/12/19 0001    Consent Given?  Yes    Education Handout Provided  Previously provided    Muscles Treated Head and Neck  Upper trapezius    Muscles Treated Upper Quadrant  Deltoid    Dry Needling Comments  supine and prone     Upper Trapezius Response  Palpable increased muscle length;Twitch reponse elicited    Levator Scapulae Response  Palpable increased muscle length    Deltoid Response  Palpable increased muscle length;Twitch response elicited           PT Education - 11/12/19 1032    Education Details  HEP  Person(s) Educated  Patient    Methods  Explanation;Demonstration;Tactile cues;Verbal cues;Handout    Comprehension  Verbalized understanding;Returned demonstration;Verbal cues required;Tactile cues required          PT Long Term Goals - 10/31/19 1202      PT LONG TERM GOAL #1   Title  I with advanced HEP to include return to rowing (12/12/2019)    Time  6    Period  Weeks    Status  New    Target Date  12/12/19      PT LONG TERM GOAL #2   Title  improve  Rt upper back strength to = Lt with no pain on resistance ( 12/12/2019)    Time  6    Period  Weeks    Status  New    Target Date  12/12/19      PT LONG TERM GOAL #3   Title  improve quick DASH score =/< 15 ( 12/12/2019)    Time  6    Period  Weeks    Status  New    Target Date  12/12/19      PT LONG TERM GOAL #4   Title  improve Rt cervical rotation =/> 70 degrees without pain ( 12/12/2019)    Time  6    Period  Weeks    Status  New    Target Date  12/12/19      PT LONG TERM GOAL #5   Title  demo painfree and full Rt shoulder ROM to allow return to normal activities and sleep ( 12/12/2019)    Time  6    Period  Weeks     Status  New    Target Date  12/12/19            Plan - 11/12/19 1114    Clinical Impression Statement  Persistent muscular tightness through the anterior and posterior shoulder girdle area. Added bilat doorway stretch and scap squeeze. Cotninued with DN/manual work. Education re muscular imbalance and importance of stretching pecs. Good response to treatment.    Rehab Potential  Excellent    PT Frequency  2x / week    PT Duration  6 weeks    PT Treatment/Interventions  Iontophoresis 4mg /ml Dexamethasone;Electrical Stimulation;Cryotherapy;Ultrasound;Moist Heat;Neuromuscular re-education;Therapeutic exercise;Vasopneumatic Device;Joint Manipulations;Passive range of motion;Dry needling;Taping    PT Next Visit Plan  continue DN, cont ionto and progress HEP    PT Home Exercise Plan  HEP    Consulted and Agree with Plan of Care  Patient       Patient will benefit from skilled therapeutic intervention in order to improve the following deficits and impairments:     Visit Diagnosis: Acute pain of right shoulder  Other symptoms and signs involving the musculoskeletal system  Muscle weakness (generalized)     Problem List There are no problems to display for this patient.   [place], MPH  11/12/2019, 11:18 AM  [facility] [place], [place], [address] Phone: [phone]   Fax:  [phone]  Name: Lori King MRN: [id number] Date of Birth: 03-Oct-1965

## 2019-11-15 ENCOUNTER — Ambulatory Visit: Payer: BC Managed Care – PPO | Admitting: Rehabilitative and Restorative Service Providers"

## 2019-11-15 ENCOUNTER — Encounter: Payer: Self-pay | Admitting: Rehabilitative and Restorative Service Providers"

## 2019-11-15 ENCOUNTER — Other Ambulatory Visit: Payer: Self-pay

## 2019-11-15 DIAGNOSIS — M25511 Pain in right shoulder: Secondary | ICD-10-CM | POA: Diagnosis not present

## 2019-11-15 DIAGNOSIS — M25572 Pain in left ankle and joints of left foot: Secondary | ICD-10-CM

## 2019-11-15 DIAGNOSIS — R2689 Other abnormalities of gait and mobility: Secondary | ICD-10-CM

## 2019-11-15 DIAGNOSIS — R29898 Other symptoms and signs involving the musculoskeletal system: Secondary | ICD-10-CM | POA: Diagnosis not present

## 2019-11-15 DIAGNOSIS — M6281 Muscle weakness (generalized): Secondary | ICD-10-CM

## 2019-11-15 NOTE — Patient Instructions (Signed)
Scapular Retraction (Standing)    With arms at sides, pinch shoulder blades down and back Hold several seconds  Repeat _5-10___ times per set. Do ____ sets per session. Do _several __ sessions per day. Can use noodle along spine    Move arms while maintaining shoulder girdle position   Resisted External Rotation: in Neutral - Bilateral   PALMS UP Sit or stand, tubing in both hands, elbows at sides, bent to 90, forearms forward. Pinch shoulder blades together and rotate forearms out. Keep elbows at sides. Repeat __10__ times per set. Do _2-3___ sets per session. Do _2-3___ sessions per day.

## 2019-11-15 NOTE — Therapy (Signed)
[place], [place], [address] Phone: [phone]   Fax:  [phone]  Physical Therapy Treatment  Patient Details  Name: Lori King MRN: [id number] Date of Birth: 1965-09-18 Referring Provider (PT): Dr Victorino December   Encounter Date: 11/15/2019  PT End of Session - 11/15/19 1445    Visit Number  5    Number of Visits  12    Date for PT Re-Evaluation  12/12/19    Authorization Type  BCBS    PT Start Time  [id number]    PT Stop Time  1530   moist heat x 10 min end of treatment   PT Time Calculation (min)  45 min    Activity Tolerance  Patient tolerated treatment well       History reviewed. No pertinent past medical history.  Past Surgical History:  Procedure Laterality Date  . BACK SURGERY    . BREAST BIOPSY Left   . CESAREAN SECTION    . TUBAL LIGATION      There were no vitals filed for this visit.  Subjective Assessment - 11/15/19 1446    Subjective  Some soreness following DN - also had COVID vaccine the day before and could have been sore from that as well    Currently in Pain?  Yes    Pain Score  2     Pain Location  Shoulder    Pain Orientation  Right    Pain Descriptors / Indicators  Tightness;Burning;Sore    Pain Type  Chronic pain    Pain Onset  More than a month ago    Pain Frequency  Intermittent    Aggravating Factors   reaching up to the side and in front    Pain Relieving Factors  ice and ibuprofen                       [facility] Adult PT Treatment/Exercise - 11/15/19 0001      Therapeutic Activites    Therapeutic Activities  --   lying supine on yoga egg 2-3 min UE's at side ~ 70 deg      Neuro Re-ed    Neuro Re-ed Details   working on posterior shoulder girdle control recruiting middle and lower trap with noodle along spine - moving UE's with maintaining scapular control       Shoulder Exercises: Standing   External Rotation  Strengthening;Both;20  reps;Theraband   w/ noodle    Theraband Level (Shoulder External Rotation)  Level 1 (Yellow)    Other Standing Exercises  axial extension 5 sec x 5; scap squeeze 5 sec x 5 with noodle       Shoulder Exercises: Stretch   Other Shoulder Stretches  three way doorway stretch 30 sec x 2 each position    repeated mid level after treatment w/ increased mobility      Moist Heat Therapy   Number Minutes Moist Heat  10 Minutes    Moist Heat Location  Cervical;Shoulder      Manual Therapy   Manual therapy comments  skilled palpation and monitoring during DN    Joint Mobilization  cervical upper thoracic PA mobs Grade II/III    Soft tissue mobilization  STM to Rt deltoid, bicep and upper trap/levator with pt prone; pecs and anterior shoulder with pt supine     Myofascial Release  anterior chest     Scapular Mobilization  Rt  Trigger Point Dry Needling - 11/15/19 0001    Consent Given?  Yes    Education Handout Provided  Previously provided    Muscles Treated Head and Neck  Upper trapezius    Dry Needling Comments  prone     Upper Trapezius Response  Palpable increased muscle length;Twitch reponse elicited    Levator Scapulae Response  Palpable increased muscle length           PT Education - 11/15/19 1510    Education Details  HEP    Person(s) Educated  Patient    Methods  Explanation;Demonstration;Tactile cues;Verbal cues;Handout    Comprehension  Verbalized understanding;Returned demonstration;Verbal cues required;Tactile cues required          PT Long Term Goals - 10/31/19 1202      PT LONG TERM GOAL #1   Title  I with advanced HEP to include return to rowing (12/12/2019)    Time  6    Period  Weeks    Status  New    Target Date  12/12/19      PT LONG TERM GOAL #2   Title  improve  Rt upper back strength to = Lt with no pain on resistance ( 12/12/2019)    Time  6    Period  Weeks    Status  New    Target Date  12/12/19      PT LONG TERM GOAL #3   Title   improve quick DASH score =/< 15 ( 12/12/2019)    Time  6    Period  Weeks    Status  New    Target Date  12/12/19      PT LONG TERM GOAL #4   Title  improve Rt cervical rotation =/> 70 degrees without pain ( 12/12/2019)    Time  6    Period  Weeks    Status  New    Target Date  12/12/19      PT LONG TERM GOAL #5   Title  demo painfree and full Rt shoulder ROM to allow return to normal activities and sleep ( 12/12/2019)    Time  6    Period  Weeks    Status  New    Target Date  12/12/19            Plan - 11/15/19 1451    Clinical Impression Statement  Gradual improvement. Continues to have muscular tightness and muscular imbalance in anterior/posterior shoulder girdle musculature. Continued with manual work and work on stretching and posterior shoulder girdle strengthening.    Rehab Potential  Excellent    PT Frequency  2x / week    PT Duration  6 weeks    PT Treatment/Interventions  Iontophoresis 4mg /ml Dexamethasone;Electrical Stimulation;Cryotherapy;Ultrasound;Moist Heat;Neuromuscular re-education;Therapeutic exercise;Vasopneumatic Device;Joint Manipulations;Passive range of motion;Dry needling;Taping    PT Next Visit Plan  continue DN, cont ionto and progress HEP    PT Home Exercise Plan  HEP    Consulted and Agree with Plan of Care  Patient       Patient will benefit from skilled therapeutic intervention in order to improve the following deficits and impairments:     Visit Diagnosis: Acute pain of right shoulder  Other symptoms and signs involving the musculoskeletal system  Muscle weakness (generalized)  Pain in left ankle and joints of left foot  Other abnormalities of gait and mobility     Problem List There are no problems to display for this patient.   [name]  [name] PT, MPH  11/15/2019, 4:48 PM  [facility] [place], [place], [address] Phone: [phone]   Fax:   [phone]  Name: Lori King MRN: [id number] Date of Birth: [birth year]

## 2019-11-19 ENCOUNTER — Encounter: Payer: Self-pay | Admitting: Physical Therapy

## 2019-11-19 ENCOUNTER — Other Ambulatory Visit: Payer: Self-pay

## 2019-11-19 ENCOUNTER — Ambulatory Visit: Payer: BC Managed Care – PPO | Admitting: Physical Therapy

## 2019-11-19 DIAGNOSIS — M25511 Pain in right shoulder: Secondary | ICD-10-CM | POA: Diagnosis not present

## 2019-11-19 DIAGNOSIS — R29898 Other symptoms and signs involving the musculoskeletal system: Secondary | ICD-10-CM | POA: Diagnosis not present

## 2019-11-19 DIAGNOSIS — M6281 Muscle weakness (generalized): Secondary | ICD-10-CM

## 2019-11-19 NOTE — Patient Instructions (Signed)
Access Code: [email or web address]: [email or web address]: 03/22/2021Prepared by: [place]  Standing Shoulder Row with Anchored Resistance - 1 x daily - 10 reps - 1-3 sets  Shoulder Extension with Resistance - 1 x daily - 10 reps - 1-3 sets  Doorway Pec Stretch at 90 Degrees Abduction - 1 x daily - 2-3 reps - 30 hold  Shoulder External Rotation Reactive Isometrics - 1 x daily - 10 reps - 1-3 sets  Standing Shoulder Internal Rotation with Anchored Resistance - 1 x daily - 10 reps - 1-3 sets  Sidelying Thoracic Rotation with Open Book - 1 x daily - 7 x weekly - 1 sets - 5-10 reps - 5 seconds hold

## 2019-11-19 NOTE — Therapy (Signed)
[place], [place], [address] Phone: [phone]   Fax:  [phone]  Physical Therapy Treatment  Patient Details  Name: Lori King MRN: [id number] Date of Birth: December 13, 1965 Referring Provider (PT): Dr Victorino December   Encounter Date: 11/19/2019  PT End of Session - 11/19/19 1018    Visit Number  6    Number of Visits  12    Date for PT Re-Evaluation  12/12/19    Authorization Type  BCBS    PT Start Time  1018    PT Stop Time  1100    PT Time Calculation (min)  42 min    Activity Tolerance  Patient tolerated treatment well    Behavior During Therapy  [facility] for tasks assessed/performed       History reviewed. No pertinent past medical history.  Past Surgical History:  Procedure Laterality Date  . BACK SURGERY    . BREAST BIOPSY Left   . CESAREAN SECTION    . TUBAL LIGATION      There were no vitals filed for this visit.  Subjective Assessment - 11/19/19 1018    Subjective  Pt reports she is moving better and not as tight in shoulder and neck.  She continues to have pain in Rt shoulder with reaching (horiz abd, flexion, abdct)    Currently in Pain?  Yes    Pain Score  1    1/10 at rest, 2/10 with movement.   Pain Location  Shoulder    Pain Orientation  Right    Pain Descriptors / Indicators  Sore;Tightness    Aggravating Factors   see above    Pain Relieving Factors  heat         [facility] PT Assessment - 11/19/19 0001      Assessment   Medical Diagnosis  Rt shoulder impingement    Referring Provider (PT)  Dr Victorino December    Onset Date/Surgical Date  07/03/19    Hand Dominance  Right    Next MD Visit  11/23/2019    Prior Therapy  not for the shoulder      AROM   Cervical - Right Rotation  65    Cervical - Left Rotation  70       [facility] Adult PT Treatment/Exercise - 11/19/19 0001      Shoulder Exercises: Seated   Other Seated Exercises  cervical rotation x 5 reps each  direction       Shoulder Exercises: Standing   External Rotation  Strengthening;Both;15 reps    Theraband Level (Shoulder External Rotation)  Level 2 (Red)    ABduction  Right;10 reps;AROM   to 90 deg, with cues for scap retraction/depression   Row  10 reps;Both    Theraband Level (Shoulder Row)  Level 2 (Red)    Row Limitations  10# row with cables x 5, 20# x 5 reps - no pain in shoulder (to assess response to return to rowing machine.       Shoulder Exercises: ROM/Strengthening   UBE (Upper Arm Bike)  L2: 79mn forward, 1 min backward       Shoulder Exercises: Stretch   Star Gazer Stretch  2 reps;30 seconds    Other Shoulder Stretches  3 position doorway stretch x 20 sec x 2 reps each side.     Other Shoulder Stretches  Open book with Rt thoracic rotation x 5 reps, repeated with yellow band x 5.  seated Lt  lateral neck flexion with shoulder anchored with towel x 15 sec x 4 reps       Manual Therapy   Soft tissue mobilization  IASTM to Rt deltoid, upper trap, scalenes.       [employer]   Create Space  I strip of re Rock tape applied to Rt posterior delt with 15% stretch.              PT Education - 11/19/19 1056    Education Details  added open book stretch    Person(s) Educated  Patient    Methods  Explanation;Handout;Demonstration;Verbal cues    Comprehension  Verbalized understanding;Returned demonstration          PT Long Term Goals - 11/19/19 1029      PT LONG TERM GOAL #1   Title  I with advanced HEP to include return to rowing (12/12/2019)    Time  6    Period  Weeks    Status  On-going      PT LONG TERM GOAL #2   Title  improve  Rt upper back strength to = Lt with no pain on resistance ( 12/12/2019)    Time  6    Period  Weeks    Status  New      PT LONG TERM GOAL #3   Title  improve quick DASH score =/< 15 ( 12/12/2019)    Time  6    Period  Weeks    Status  On-going      PT LONG TERM GOAL #4   Title  improve Rt cervical rotation =/> 70 degrees  without pain ( 12/12/2019)    Time  6    Period  Weeks    Status  On-going      PT LONG TERM GOAL #5   Title  demo painfree and full Rt shoulder ROM to allow return to normal activities and sleep ( 12/12/2019)    Baseline  sleeping better.    Time  6    Period  Weeks    Status  Partially Met            Plan - 11/19/19 1027    Clinical Impression Statement  Overall improvement in cervical rotation and Rt shoulder motion with less pain.  Pt tolerated increased resistance for shoulder exercise without increase in pain.  Pt was point tender and tight in Rt posterior deltoid with IASTM.  Pt progressing well towards LTGs.    Rehab Potential  Excellent    PT Frequency  2x / week    PT Duration  6 weeks    PT Treatment/Interventions  Iontophoresis 30m/ml Dexamethasone;Electrical Stimulation;Cryotherapy;Ultrasound;Moist Heat;Neuromuscular re-education;Therapeutic exercise;Vasopneumatic Device;Joint Manipulations;Passive range of motion;Dry needling;Taping    PT Next Visit Plan  MD note, FOTO.    PT Home Exercise Plan  HEP    Consulted and Agree with Plan of Care  Patient       Patient will benefit from skilled therapeutic intervention in order to improve the following deficits and impairments:     Visit Diagnosis: Acute pain of right shoulder  Other symptoms and signs involving the musculoskeletal system  Muscle weakness (generalized)     Problem List There are no problems to display for this patient.  JKerin Perna PTA 11/19/19 1:05 PM  [place] [address]Phone: [phone]  Fax:  [phone] Name: CKAMRIN SPATHMRN: [id number] of Birth: 326-Nov-1967

## 2019-11-21 ENCOUNTER — Other Ambulatory Visit: Payer: Self-pay

## 2019-11-21 ENCOUNTER — Ambulatory Visit: Payer: BC Managed Care – PPO | Admitting: Rehabilitative and Restorative Service Providers"

## 2019-11-21 ENCOUNTER — Encounter: Payer: Self-pay | Admitting: Rehabilitative and Restorative Service Providers"

## 2019-11-21 DIAGNOSIS — M25511 Pain in right shoulder: Secondary | ICD-10-CM | POA: Diagnosis not present

## 2019-11-21 DIAGNOSIS — M6281 Muscle weakness (generalized): Secondary | ICD-10-CM | POA: Diagnosis not present

## 2019-11-21 DIAGNOSIS — R29898 Other symptoms and signs involving the musculoskeletal system: Secondary | ICD-10-CM | POA: Diagnosis not present

## 2019-11-21 NOTE — Patient Instructions (Signed)
Hug the wall  Stand facing wall place hands/arms at 90 degrees from body resting hands on wall  Drop the left arm and turn slightly to the left  Feel stretch across the front of left shoulder and chest 30 sec x 3 reps x 2 x/day   Bow and arrow with theraband  Variation of row with TB

## 2019-11-21 NOTE — Therapy (Signed)
[facility] [place], [place], [address] Phone: [phone]   Fax:  [phone]  Physical Therapy Treatment  Patient Details  Name: VERONDA GABOR MRN: [id number] Date of Birth: June 01, 1966 Referring Provider (PT): Dr Victorino December   Encounter Date: 11/21/2019  PT End of Session - 11/21/19 1027    Visit Number  7    Number of Visits  12    Date for PT Re-Evaluation  12/12/19    Authorization Type  BCBS    PT Start Time  1021    PT Stop Time  1112    PT Time Calculation (min)  51 min    Activity Tolerance  Patient tolerated treatment well       History reviewed. No pertinent past medical history.  Past Surgical History:  Procedure Laterality Date  . BACK SURGERY    . BREAST BIOPSY Left   . CESAREAN SECTION    . TUBAL LIGATION      There were no vitals filed for this visit.  Subjective Assessment - 11/21/19 1027    Subjective  Patient reports that her shoulder is doing better overall. She is using Rt UE UE for more functional activities. Exercises are helping. About 85% improved.    Currently in Pain?  Yes    Pain Score  1     Pain Location  Shoulder    Pain Orientation  Right    Pain Descriptors / Indicators  Sore;Tightness    Pain Type  Chronic pain    Pain Onset  More than a month ago    Pain Frequency  Intermittent    Aggravating Factors   reaching across body; reaching down to pull back    Pain Relieving Factors  therapy; heat         [facility] PT Assessment - 11/21/19 0001      Assessment   Medical Diagnosis  Rt shoulder impingement    Referring Provider (PT)  Dr Victorino December    Onset Date/Surgical Date  07/03/19    Hand Dominance  Right    Next MD Visit  11/23/2019    Prior Therapy  not for the shoulder      Posture/Postural Control   Posture Comments  improving posture - continues to demonstrate some forward posture with scapular abducted and rotated along the thoracic wall       AROM   Right/Left Shoulder  --   tightness end ranges    Cervical Flexion  68    Cervical Extension  49    Cervical - Right Side Bend  43    Cervical - Left Side Bend  41    Cervical - Right Rotation  71    Cervical - Left Rotation  74      Strength   Right/Left Shoulder  --   5/5 and pain free Rt shoulder      Palpation   Palpation comment  improving tightness through the Rt shoulder girdle - pecs; anterior deltoid; upper trap; ant/lat cervical musculature                    [facility] Adult PT Treatment/Exercise - 11/21/19 0001      Shoulder Exercises: Standing   External Rotation  Strengthening;Both;15 reps    Theraband Level (Shoulder External Rotation)  Level 2 (Red)    ABduction  Right;10 reps;AROM   to 90 deg, with cues for scap retraction/depression   Theraband Level (Shoulder ABduction)  Level 2 (Red)    Row  10 reps;Both    Theraband Level (Shoulder Row)  Level 2 (Red)    Row Limitations  bow and arrow step back green TB x 15 each side     Retraction  Strengthening;Both;10 reps      Shoulder Exercises: ROM/Strengthening   UBE (Upper Arm Bike)  L2: 2 min forward, 2 min backward       Shoulder Exercises: Stretch   Star Gazer Stretch  2 reps;30 seconds    Other Shoulder Stretches  3 position doorway stretch x 20 sec x 2 reps each side.     Other Shoulder Stretches  T stretch at wall into horizontal abduction 30 sec x 2       Moist Heat Therapy   Number Minutes Moist Heat  10 Minutes    Moist Heat Location  Cervical;Shoulder      Manual Therapy   Joint Mobilization  cervical upper thoracic PA mobs Grade II/III; [place] mobs inferior glides     Soft tissue mobilization  deep tissue work through the Rt shoulder girdle and cervical musculature; upper trap; leveator; pecs    Myofascial Release  anterior chest     Scapular Mobilization  Rt              PT Education - 11/21/19 1050    Education Details  HEP    Person(s) Educated  Patient    Methods   Explanation;Demonstration;Tactile cues;Verbal cues;Handout    Comprehension  Verbalized understanding;Returned demonstration;Verbal cues required;Tactile cues required          PT Long Term Goals - 11/21/19 1123      PT LONG TERM GOAL #1   Title  I with advanced HEP to include return to rowing (12/12/2019)    Time  6    Period  Weeks    Status  On-going    Target Date  12/12/19      PT LONG TERM GOAL #2   Title  improve  Rt upper back strength to = Lt with no pain on resistance ( 12/12/2019)    Time  6    Period  Weeks    Status  Achieved    Target Date  12/12/19      PT LONG TERM GOAL #3   Title  improve quick DASH score =/< 15 ( 12/12/2019)    Time  6    Period  Weeks    Status  On-going    Target Date  12/12/19      PT LONG TERM GOAL #4   Title  improve Rt cervical rotation =/> 70 degrees without pain ( 12/12/2019)    Time  6    Period  Weeks    Status  Achieved    Target Date  12/12/19      PT LONG TERM GOAL #5   Title  demo painfree and full Rt shoulder ROM to allow return to normal activities and sleep ( 12/12/2019)    Baseline  sleeping better.    Time  6    Period  Weeks    Status  Partially Met    Target Date  12/12/19            Plan - 11/21/19 1121    Clinical Impression Statement  Improvement reported with decreased frequency, intensity and duration of pain; increased cervical and shoulder mobility; decreased muscular tightness to palpation; improved functional activity level Goasl of therapy partially accomplished. Patient will benefit  from continued therapy to reach maximum rehab potential.    Rehab Potential  Excellent    PT Frequency  2x / week    PT Duration  6 weeks    PT Treatment/Interventions  Iontophoresis 43m/ml Dexamethasone;Electrical Stimulation;Cryotherapy;Ultrasound;Moist Heat;Neuromuscular re-education;Therapeutic exercise;Vasopneumatic Device;Joint Manipulations;Passive range of motion;Dry needling;Taping    PT Next Visit Plan  note  to MD    PT Home Exercise Plan  HEP    Consulted and Agree with Plan of Care  Patient       Patient will benefit from skilled therapeutic intervention in order to improve the following deficits and impairments:     Visit Diagnosis: Acute pain of right shoulder  Other symptoms and signs involving the musculoskeletal system  Muscle weakness (generalized)     Problem List There are no problems to display for this patient.   CTool MPH  11/21/2019, 11:26 AM  [facility] [address]Phone: [phone]  Fax:  [phone] Name: CDASIE CHANCELLORMRN: [id number] of Birth: [birth year]

## 2019-12-05 ENCOUNTER — Encounter: Payer: BC Managed Care – PPO | Admitting: Rehabilitative and Restorative Service Providers"

## 2019-12-06 ENCOUNTER — Encounter: Payer: BC Managed Care – PPO | Admitting: Rehabilitative and Restorative Service Providers"

## 2019-12-12 ENCOUNTER — Other Ambulatory Visit: Payer: Self-pay

## 2019-12-12 ENCOUNTER — Ambulatory Visit: Payer: BC Managed Care – PPO | Admitting: Rehabilitative and Restorative Service Providers"

## 2019-12-12 DIAGNOSIS — R29898 Other symptoms and signs involving the musculoskeletal system: Secondary | ICD-10-CM

## 2019-12-12 DIAGNOSIS — M6281 Muscle weakness (generalized): Secondary | ICD-10-CM

## 2019-12-12 DIAGNOSIS — M25511 Pain in right shoulder: Secondary | ICD-10-CM

## 2019-12-12 NOTE — Patient Instructions (Signed)
Lat stretch 30 sec x 3  Holding ball with pec doorway stretch 30 sec x 3 each position

## 2019-12-12 NOTE — Therapy (Signed)
[facility] [place], [place], [address] Phone: [phone]   Fax:  [phone]  Physical Therapy Treatment  Patient Details  Name: Lori King MRN: [id number] Date of Birth: [birth year] Referring Provider (PT): Dr Victorino December   Encounter Date: 12/12/2019  PT End of Session - 12/12/19 1101    Visit Number  8    Number of Visits  20    Date for PT Re-Evaluation  01/23/20    Authorization Type  BCBS    PT Start Time  [id number]    PT Stop Time  1100   MH end of treatment   PT Time Calculation (min)  42 min       No past medical history on file.  Past Surgical History:  Procedure Laterality Date  . BACK SURGERY    . BREAST BIOPSY Left   . CESAREAN SECTION    . TUBAL LIGATION      There were no vitals filed for this visit.  Subjective Assessment - 12/12/19 1020    Subjective  Patient reports that shoulder hurts on an intermittent basis - more after exercise. Had MRI yesterday. Riding in the car increases pain.    Currently in Pain?  Yes    Pain Score  3     Pain Location  Shoulder    Pain Orientation  Right    Pain Descriptors / Indicators  Sore;Tightness    Pain Type  Chronic pain    Pain Onset  More than a month ago    Pain Frequency  Intermittent    Aggravating Factors   reaching across body; reaching back    Pain Relieving Factors  stretching; heat         [facility] PT Assessment - 12/12/19 0001      Assessment   Medical Diagnosis  Rt shoulder impingement    Referring Provider (PT)  Dr Victorino December    Onset Date/Surgical Date  07/03/19    Hand Dominance  Right    Next MD Visit  11/23/2019    Prior Therapy  not for the shoulder      Posture/Postural Control   Posture Comments  improving posture - continues to demonstrate some forward posture with scapular abducted and rotated along the thoracic wall       AROM   Right/Left Shoulder  --   tightness at end range Rt shoulder flex; ER IR     Cervical Flexion  51    Cervical Extension  53    Cervical - Right Side Bend  37    Cervical - Left Side Bend  35    Cervical - Right Rotation  68    Cervical - Left Rotation  67      Strength   Right/Left Shoulder  --   weakness Rt lower trap 4+/5 - pain      Palpation   Palpation comment  tightness through the Rt shoulder girdle - pecs; anterior deltoid; upper trap; leveator; teres  ant/lat cervical musculature                    [facility] Adult PT Treatment/Exercise - 12/12/19 0001      Shoulder Exercises: Stretch   Other Shoulder Stretches  3 position doorway stretch x 20 sec x 2 reps each side.     Other Shoulder Stretches  lat stretch supine PT assist for knee flexion 30 sec x 3 reap; T stretch at wall into  horizontal abduction 30 sec x 2       Moist Heat Therapy   Number Minutes Moist Heat  10 Minutes    Moist Heat Location  Cervical;Shoulder      Manual Therapy   Joint Mobilization  cervical upper thoracic PA mobs Grade II/III; [place] mobs inferior glides     Soft tissue mobilization  deep tissue work through the Rt shoulder girdle and cervical musculature; upper trap; leveator; pecs    Myofascial Release  anterior chest     Scapular Mobilization  Rt     Passive ROM  PROM Rt shoulder flexion; ER; extension; horizontal abduction              PT Education - 12/12/19 1100    Education Details  HEP    Person(s) Educated  Patient    Methods  Explanation;Demonstration;Tactile cues;Verbal cues    Comprehension  Verbalized understanding;Returned demonstration;Verbal cues required;Tactile cues required          PT Long Term Goals - 12/12/19 1143      PT LONG TERM GOAL #1   Title  I with advanced HEP to include return to rowing (12/12/2019)    Time  6    Period  Weeks    Status  Revised    Target Date  01/23/20      PT LONG TERM GOAL #2   Title  improve  Rt lower trap strength to 5/5 with no pain on resistance    Time  6    Period  Weeks    Status   Revised    Target Date  01/23/20      PT LONG TERM GOAL #3   Title  patient report reaching up and behind body with minimal to no pain    Time  6    Period  Weeks    Status  Revised    Target Date  01/23/20      PT LONG TERM GOAL #4   Time  6    Period  Weeks    Status  Revised    Target Date  01/23/20      PT LONG TERM GOAL #5   Title  demo painfree and full Rt shoulder ROM to allow return to normal activities    Baseline  -    Time  6    Period  Weeks    Status  Revised    Target Date  01/23/20            Plan - 12/12/19 1102    Clinical Impression Statement  Patient reports inprovement in Rt shoulder from the time of initial PT evaluation and treatment but continued intermittent pain with functional activities. She had an MRI yesterday but does not know the results.Re-evaluation of Rt shoulder pain = continued rounded forward shoulders with scapulae abducted and rotated along the thoracic wall, head of the humerus anterior. She has weakness in lower trap; end range tightness in Rt shoulder motion; tenderness to palpation in the anterior Rt shoulder and teres/lats; anterior deltoid; biceps. Patient will benefit from continued PT to address problems identified.    Rehab Potential  Excellent    PT Frequency  2x / week    PT Duration  12 weeks    PT Treatment/Interventions  Iontophoresis 4mg /ml Dexamethasone;Electrical Stimulation;Cryotherapy;Ultrasound;Moist Heat;Neuromuscular re-education;Therapeutic exercise;Vasopneumatic Device;Joint Manipulations;Passive range of motion;Dry needling;Taping    PT Next Visit Plan  DN/manual work anterior shd/teres/lats; stretch lats/prayer stretch; strengthening lower trap on prone; modalities  as indicated    PT Home Exercise Plan  HEP    Consulted and Agree with Plan of Care  Patient       Patient will benefit from skilled therapeutic intervention in order to improve the following deficits and impairments:     Visit Diagnosis: Acute  pain of right shoulder - Plan: PT plan of care cert/re-cert  Other symptoms and signs involving the musculoskeletal system - Plan: PT plan of care cert/re-cert  Muscle weakness (generalized) - Plan: PT plan of care cert/re-cert     Problem List There are no problems to display for this patient.   [place], MPH  12/12/2019, 11:49 AM  [facility] [place], [place], [address] Phone: [phone]   Fax:  [phone]  Name: Lori King MRN: [id number] Date of Birth: 14-Oct-1965

## 2019-12-19 ENCOUNTER — Ambulatory Visit: Payer: BC Managed Care – PPO | Admitting: Rehabilitative and Restorative Service Providers"

## 2019-12-19 ENCOUNTER — Other Ambulatory Visit: Payer: Self-pay

## 2019-12-19 ENCOUNTER — Encounter: Payer: Self-pay | Admitting: Rehabilitative and Restorative Service Providers"

## 2019-12-19 DIAGNOSIS — M25511 Pain in right shoulder: Secondary | ICD-10-CM

## 2019-12-19 DIAGNOSIS — R29898 Other symptoms and signs involving the musculoskeletal system: Secondary | ICD-10-CM

## 2019-12-19 DIAGNOSIS — M6281 Muscle weakness (generalized): Secondary | ICD-10-CM

## 2019-12-19 NOTE — Patient Instructions (Signed)

## 2019-12-19 NOTE — Therapy (Signed)
[place], [place], [address] Phone: [phone]   Fax:  [phone]  Physical Therapy Treatment  Patient Details  Name: Lori King MRN: [id number] Date of Birth: Oct 18, 1965 Referring Provider (PT): Dr Victorino December   Encounter Date: 12/19/2019  PT End of Session - 12/19/19 0936    Visit Number  9    Number of Visits  20    Date for PT Re-Evaluation  01/23/20    Authorization Type  BCBS    PT Start Time  0931    PT Stop Time  1025    PT Time Calculation (min)  54 min    Activity Tolerance  Patient tolerated treatment well       History reviewed. No pertinent past medical history.  Past Surgical History:  Procedure Laterality Date  . BACK SURGERY    . BREAST BIOPSY Left   . CESAREAN SECTION    . TUBAL LIGATION      There were no vitals filed for this visit.  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 He injected her shoulder and she feels that it has made some difference. Still painful with raising arm to side.    Currently in Pain?  No/denies    Pain Location  Shoulder    Pain Orientation  Right    Pain Type  Chronic pain    Pain Onset  More than a month ago    Pain Frequency  Intermittent                       [facility] Adult PT Treatment/Exercise - 12/19/19 0001      Shoulder Exercises: Supine   Other Supine Exercises  chest lift 10 sec x 5 reps       Shoulder Exercises: Stretch   Other Shoulder Stretches  3 position doorway stretch x 20 sec x 2 reps each side.     Other Shoulder Stretches  lat stretch supine PT assist for knee flexion 30 sec x 3 reap; T stretch at wall into horizontal abduction 30 sec x 2       Modalities   Modalities  --   trial of TENS to address soreness and eval for home      Moist Heat  Therapy   Number Minutes Moist Heat  10 Minutes    Moist Heat Location  Cervical;Shoulder      Electrical Stimulation   Electrical Stimulation Location  Rt shoulder girdle     Electrical Stimulation Action  TENS    Electrical Stimulation Parameters  to tolerance    Electrical Stimulation Goals  Pain;Tone      Manual Therapy   Manual Therapy  --   pt supine and Lt sidelying    Manual therapy comments  skilled palpation and monitoring during DN    Joint Mobilization  GH joint mobs Grade II/III    Soft tissue mobilization  deep tissue work through the Rt shoulder girdle musculature; upper trap; leveator; pecs; teres    Myofascial Release  anterior shoulder     Scapular Mobilization  Rt     Passive ROM  PROM Rt shoulder flexion; ER; extension; horizontal abduction        Trigger Point Dry Needling - 12/19/19 0001  Consent Given?  Yes    Education Handout Provided  Previously provided    Dry Needling Comments  supine Rt side only     Deltoid Response  Palpable increased muscle length;Twitch response elicited    Biceps Response  Palpable increased muscle length;Twitch response elicited           PT Education - 12/19/19 1019    Education Details  TENS    Person(s) Educated  Patient    Methods  Explanation;Handout;Demonstration    Comprehension  Verbalized understanding          PT Long Term Goals - 12/12/19 1143      PT LONG TERM GOAL #1   Title  I with advanced HEP to include return to rowing (12/12/2019)    Time  6    Period  Weeks    Status  Revised    Target Date  01/23/20      PT LONG TERM GOAL #2   Title  improve  Rt lower trap strength to 5/5 with no pain on resistance    Time  6    Period  Weeks    Status  Revised    Target Date  01/23/20      PT LONG TERM GOAL #3   Title  patient report reaching up and behind body with minimal to no pain    Time  6    Period  Weeks    Status  Revised    Target Date  01/23/20      PT LONG TERM GOAL #4   Time  6     Period  Weeks    Status  Revised    Target Date  01/23/20      PT LONG TERM GOAL #5   Title  demo painfree and full Rt shoulder ROM to allow return to normal activities    Baseline  -    Time  6    Period  Weeks    Status  Revised    Target Date  01/23/20            Plan - 12/19/19 1014    Clinical Impression Statement  Patient reports MRI shows no RC tear but does show evidence of adhesive capsulitis. Continued work in clinic on stretching. Joint mobs GH joint and scapulohumeral stretching. Deep tissue work through the Rt shoulder girdle with PROM/stretching. Trial of TENS to address soreness and evaluate for home purchase. Needs to add prone exercises - strengthening for lower trap to progress toward rowing machine at home.    Rehab Potential  Excellent    PT Frequency  2x / week    PT Duration  12 weeks    PT Treatment/Interventions  Iontophoresis 4mg /ml Dexamethasone;Electrical Stimulation;Cryotherapy;Ultrasound;Moist Heat;Neuromuscular re-education;Therapeutic exercise;Vasopneumatic Device;Joint Manipulations;Passive range of motion;Dry needling;Taping    PT Next Visit Plan  DN/manual work anterior shd/teres/lats; stretch lats/prayer stretch; strengthening lower trap in prone; modalities as indicated    PT Home Exercise Plan  HEP    Consulted and Agree with Plan of Care  Patient       Patient will benefit from skilled therapeutic intervention in order to improve the following deficits and impairments:     Visit Diagnosis: Acute pain of right shoulder  Other symptoms and signs involving the musculoskeletal system  Muscle weakness (generalized)     Problem List There are no problems to display for this patient.   Tishawna Larouche [name] PT, MPH  12/19/2019, 10:21 AM  [facility] [place], [place], [address] Phone: [phone]   Fax:  [phone]  Name: KILEE LICHTENSTEIN MRN: [id number] Date of  Birth: 02-17-1966

## 2019-12-21 ENCOUNTER — Encounter: Payer: BC Managed Care – PPO | Admitting: Rehabilitative and Restorative Service Providers"

## 2019-12-24 ENCOUNTER — Encounter: Payer: Self-pay | Admitting: Rehabilitative and Restorative Service Providers"

## 2019-12-24 ENCOUNTER — Ambulatory Visit: Payer: BC Managed Care – PPO | Admitting: Rehabilitative and Restorative Service Providers"

## 2019-12-24 ENCOUNTER — Other Ambulatory Visit: Payer: Self-pay

## 2019-12-24 DIAGNOSIS — M6281 Muscle weakness (generalized): Secondary | ICD-10-CM | POA: Diagnosis not present

## 2019-12-24 DIAGNOSIS — M25511 Pain in right shoulder: Secondary | ICD-10-CM | POA: Diagnosis not present

## 2019-12-24 DIAGNOSIS — R29898 Other symptoms and signs involving the musculoskeletal system: Secondary | ICD-10-CM | POA: Diagnosis not present

## 2019-12-24 NOTE — Therapy (Signed)
[place], [place], [address] Phone: [phone]   Fax:  [phone]  Physical Therapy Treatment  Patient Details  Name: Lori King MRN: [id number] Date of Birth: 1966-06-22 Referring Provider (PT): Dr Victorino December   Encounter Date: 12/24/2019  PT End of Session - 12/24/19 1018    Visit Number  10    Number of Visits  20    Date for PT Re-Evaluation  01/23/20    PT Start Time  1017    PT Stop Time  1105    PT Time Calculation (min)  48 min    Activity Tolerance  Patient tolerated treatment well       History reviewed. No pertinent past medical history.  Past Surgical History:  Procedure Laterality Date  . BACK SURGERY    . BREAST BIOPSY Left   . CESAREAN SECTION    . TUBAL LIGATION      There were no vitals filed for this visit.  Subjective Assessment - 12/24/19 1018    Subjective  Shoulder is feeling better. She still has a little bit of pain when reaching to the side. Got the TENS unit and has been using that.    Currently in Pain?  No/denies         [facility] PT Assessment - 12/24/19 0001      Assessment   Medical Diagnosis  Rt shoulder impingement    Referring Provider (PT)  Dr Victorino December    Onset Date/Surgical Date  07/03/19    Hand Dominance  Right    Next MD Visit  PRN     Prior Therapy  not for the shoulder      AROM   Right/Left Shoulder  --   minimal tightness end range Rt shoulder elevation       Strength   Right/Left Shoulder  --   weakness lower trap 4+/5 min pain      Palpation   Palpation comment  tightness through the Rt shoulder girdle - pecs; anterior deltoid; upper trap; leveator; teres  ant/lat cervical musculature                    [facility] Adult PT Treatment/Exercise - 12/24/19 0001      Shoulder Exercises: Standing   Other Standing Exercises  downward row- green TB x 10 x 3 sets - TB over door       Shoulder Exercises:  ROM/Strengthening   UBE (Upper Arm Bike)  L2: 2 min forward, 2 min backward       Shoulder Exercises: Stretch   Wall Stretch - ABduction Limitations  T stretch at wall 30 sec x 2 reps cues for posture and alignment     Other Shoulder Stretches  3 position doorway stretch x 20 sec x 2 reps each side.       Moist Heat Therapy   Number Minutes Moist Heat  10 Minutes    Moist Heat Location  Cervical;Shoulder      Manual Therapy   Manual Therapy  --   pt supine and Lt sidelying    Joint Mobilization  GH joint mobs Grade II/III    Soft tissue mobilization  deep tissue work through the Rt shoulder girdle musculature; upper trap; leveator; pecs; teres    Myofascial Release  anterior shoulder     Scapular Mobilization  Rt     Passive ROM  PROM Rt shoulder flexion; ER; extension; horizontal abduction  PT Education - 12/24/19 1035    Education Details  HEP    Person(s) Educated  Patient    Methods  Explanation;Demonstration;Tactile cues;Verbal cues;Handout    Comprehension  Verbalized understanding;Returned demonstration;Verbal cues required;Tactile cues required          PT Long Term Goals - 12/12/19 1143      PT LONG TERM GOAL #1   Title  I with advanced HEP to include return to rowing (12/12/2019)    Time  6    Period  Weeks    Status  Revised    Target Date  01/23/20      PT LONG TERM GOAL #2   Title  improve  Rt lower trap strength to 5/5 with no pain on resistance    Time  6    Period  Weeks    Status  Revised    Target Date  01/23/20      PT LONG TERM GOAL #3   Title  patient report reaching up and behind body with minimal to no pain    Time  6    Period  Weeks    Status  Revised    Target Date  01/23/20      PT LONG TERM GOAL #4   Time  6    Period  Weeks    Status  Revised    Target Date  01/23/20      PT LONG TERM GOAL #5   Title  demo painfree and full Rt shoulder ROM to allow return to normal activities    Baseline  -    Time  6     Period  Weeks    Status  Revised    Target Date  01/23/20            Plan - 12/24/19 1021    Clinical Impression Statement  Continued gradual progress with rehab. Patient has less pain and increasing functional activities. Added strengthening exercises to prepare pt to return to rowing/rowingmaching    Rehab Potential  Excellent    PT Frequency  2x / week    PT Treatment/Interventions  Iontophoresis 4mg /ml Dexamethasone;Electrical Stimulation;Cryotherapy;Ultrasound;Moist Heat;Neuromuscular re-education;Therapeutic exercise;Vasopneumatic Device;Joint Manipulations;Passive range of motion;Dry needling;Taping    PT Next Visit Plan  DN/manual work anterior shd/teres/lats; stretch lats/prayer stretch; strengthening lower trap in prone; modalities as indicated    PT Home Exercise Plan  HEP    Consulted and Agree with Plan of Care  Patient       Patient will benefit from skilled therapeutic intervention in order to improve the following deficits and impairments:     Visit Diagnosis: Acute pain of right shoulder  Other symptoms and signs involving the musculoskeletal system  Muscle weakness (generalized)     Problem List There are no problems to display for this patient.   [place], MPH  12/24/2019, 10:56 AM  [facility] [place], [place], [address] Phone: [phone]   Fax:  [phone]  Name: Lori King MRN: [id number] Date of Birth: Aug 16, 1966

## 2019-12-24 NOTE — Patient Instructions (Signed)
Over the the door theraband row  Pulling shoulders down and back Keep upper trap relaxed  Using middle and lower trap  10 reps 2-3 sets  1 x/day    T at wall stretch Facing wall place arms in T on wall  Drop left arm and turn body to left  Hold 30 sec x 3 reps  1-2 times/day

## 2019-12-28 ENCOUNTER — Other Ambulatory Visit: Payer: Self-pay

## 2019-12-28 ENCOUNTER — Encounter: Payer: Self-pay | Admitting: Rehabilitative and Restorative Service Providers"

## 2019-12-28 ENCOUNTER — Ambulatory Visit: Payer: BC Managed Care – PPO | Admitting: Rehabilitative and Restorative Service Providers"

## 2019-12-28 DIAGNOSIS — M6281 Muscle weakness (generalized): Secondary | ICD-10-CM

## 2019-12-28 DIAGNOSIS — R29898 Other symptoms and signs involving the musculoskeletal system: Secondary | ICD-10-CM

## 2019-12-28 DIAGNOSIS — M25511 Pain in right shoulder: Secondary | ICD-10-CM | POA: Diagnosis not present

## 2019-12-28 NOTE — Therapy (Signed)
[place], [place], [address] Phone: [phone]   Fax:  [phone]  Physical Therapy Treatment  Patient Details  Name: Lori King MRN: [id number] Date of Birth: [birth year] Referring Provider (PT): Dr Victorino December   Encounter Date: 12/28/2019  PT End of Session - 12/28/19 1025    Visit Number  11    Number of Visits  20    Date for PT Re-Evaluation  01/23/20    PT Start Time  1012    PT Stop Time  1100    PT Time Calculation (min)  48 min    Activity Tolerance  Patient tolerated treatment well       History reviewed. No pertinent past medical history.  Past Surgical History:  Procedure Laterality Date  . BACK SURGERY    . BREAST BIOPSY Left   . CESAREAN SECTION    . TUBAL LIGATION      There were no vitals filed for this visit.  Subjective Assessment - 12/28/19 1025    Subjective  Shoulder is feeling a lot better - occasionally has a pain when reaching out to the side. Likes TENS unit. Working on exercises at home    Currently in Pain?  No/denies                       [facility] Adult PT Treatment/Exercise - 12/28/19 0001      Shoulder Exercises: Prone   Other Prone Exercises  prone progressionn - arms at side; goal post; T; V; superman 5 sec hold x 5 reps       Shoulder Exercises: ROM/Strengthening   UBE (Upper Arm Bike)  L2: 2 min forward, 2 min backward       Shoulder Exercises: Stretch   Wall Stretch - ABduction Limitations  T stretch at wall 30 sec x 2 reps cues for posture and alignment     Other Shoulder Stretches  3 position doorway stretch x 20 sec x 2 reps each side.       Moist Heat Therapy   Number Minutes Moist Heat  10 Minutes    Moist Heat Location  Cervical;Shoulder      Manual Therapy   Manual Therapy  --   pt supine    Joint Mobilization  GH joint mobs Grade II/III    Soft tissue mobilization  deep tissue work through the Rt shoulder  girdle musculature; upper trap; leveator; pecs; teres    Myofascial Release  anterior shoulder     Scapular Mobilization  Rt     Passive ROM  PROM Rt shoulder flexion; ER; extension; horizontal abduction              PT Education - 12/28/19 1037    Education Details  HEP    Person(s) Educated  Patient    Methods  Explanation;Demonstration;Tactile cues;Handout;Verbal cues    Comprehension  Verbalized understanding;Returned demonstration;Verbal cues required;Tactile cues required          PT Long Term Goals - 12/12/19 1143      PT LONG TERM GOAL #1   Title  I with advanced HEP to include return to rowing (12/12/2019)    Time  6    Period  Weeks    Status  Revised    Target Date  01/23/20      PT LONG TERM GOAL #2   Title  improve  Rt lower trap strength to 5/5 with no  pain on resistance    Time  6    Period  Weeks    Status  Revised    Target Date  01/23/20      PT LONG TERM GOAL #3   Title  patient report reaching up and behind body with minimal to no pain    Time  6    Period  Weeks    Status  Revised    Target Date  01/23/20      PT LONG TERM GOAL #4   Time  6    Period  Weeks    Status  Revised    Target Date  01/23/20      PT LONG TERM GOAL #5   Title  demo painfree and full Rt shoulder ROM to allow return to normal activities    Baseline  -    Time  6    Period  Weeks    Status  Revised    Target Date  01/23/20            Plan - 12/28/19 1102    Clinical Impression Statement  Excellent pogress with minimal pain; good gains in ROM. Patient added strengthening exercises without difficulty. minimal tightness noted with palpation. Anticipate d/c at next visit.    Rehab Potential  Excellent    PT Frequency  2x / week    PT Duration  12 weeks    PT Treatment/Interventions  Iontophoresis 4mg /ml Dexamethasone;Electrical Stimulation;Cryotherapy;Ultrasound;Moist Heat;Neuromuscular re-education;Therapeutic exercise;Vasopneumatic Device;Joint  Manipulations;Passive range of motion;Dry needling;Taping    PT Next Visit Plan  DN/manual work anterior shd/teres/lats; stretch lats/prayer stretch; strengthening lower trap in prone; modalities as indicated    PT Home Exercise Plan  HEP    Consulted and Agree with Plan of Care  Patient       Patient will benefit from skilled therapeutic intervention in order to improve the following deficits and impairments:     Visit Diagnosis: Acute pain of right shoulder  Other symptoms and signs involving the musculoskeletal system  Muscle weakness (generalized)     Problem List There are no problems to display for this patient.   Loving, MPH  12/28/2019, 11:15 AM  [facility] [place], [place], [address] Phone: [phone]   Fax:  [phone]  Name: VERENA ARDIS MRN: [id number] Date of Birth: [birth year]

## 2019-12-28 NOTE — Patient Instructions (Signed)
   Lying on stomach forehead resting on rolled towel for all exercises - 5 sec hold x 2-5 reps increase to 10 sec hold x 10 reps   Arms at side - squeeze shoulder blades down and back lifting arms and chin keeping chin tucked and head in neutral position  Arms in goal post position  Arms in T - thumbs to ceiling   Arms in V - thumbs to ceiling   Arms superman position - palms to foor OR thumbs to ceiling

## 2020-01-01 ENCOUNTER — Ambulatory Visit: Payer: BC Managed Care – PPO | Admitting: Rehabilitative and Restorative Service Providers"

## 2020-01-01 ENCOUNTER — Other Ambulatory Visit: Payer: Self-pay

## 2020-01-01 DIAGNOSIS — M6281 Muscle weakness (generalized): Secondary | ICD-10-CM

## 2020-01-01 DIAGNOSIS — M25511 Pain in right shoulder: Secondary | ICD-10-CM

## 2020-01-01 DIAGNOSIS — R29898 Other symptoms and signs involving the musculoskeletal system: Secondary | ICD-10-CM

## 2020-01-01 NOTE — Therapy (Addendum)
[place], [place], [address] Phone: [phone]   Fax:  [phone]  Physical Therapy Treatment  Patient Details  Name: LATACHA TEXEIRA MRN: [id number] Date of Birth: [birth year] Referring Provider (PT): Dr Victorino December   Encounter Date: 01/01/2020  PT End of Session - 01/01/20 1220    Visit Number  12    Number of Visits  20    Date for PT Re-Evaluation  01/23/20    Authorization Type  BCBS    PT Start Time  1148    PT Stop Time  1238    PT Time Calculation (min)  50 min    Activity Tolerance  Patient tolerated treatment well       No past medical history on file.  Past Surgical History:  Procedure Laterality Date  . BACK SURGERY    . BREAST BIOPSY Left   . CESAREAN SECTION    . TUBAL LIGATION      There were no vitals filed for this visit.      [facility] PT Assessment - 01/01/20 0001      Assessment   Medical Diagnosis  Rt shoulder impingement    Referring Provider (PT)  Dr Victorino December    Onset Date/Surgical Date  07/03/19    Hand Dominance  Right    Next MD Visit  PRN     Prior Therapy  not for the shoulder      AROM   Cervical Flexion  61    Cervical Extension  53    Cervical - Right Side Bend  43    Cervical - Left Side Bend  43    Cervical - Right Rotation  76    Cervical - Left Rotation  82      Strength   Right/Left Shoulder  --   WNL's no pain      Palpation   Palpation comment  improving tightness through the Rt shoulder girdle - pecs; anterior deltoid; upper trap; leveator; teres                     [facility] Adult PT Treatment/Exercise - 01/01/20 0001      Shoulder Exercises: Prone   Other Prone Exercises  prone progressionn - arms at side; goal post; T; V; superman 5 sec hold x 5 reps       Shoulder Exercises: Standing   Other Standing Exercises  ball on wall dorsum of hand - working on ER turning body slightly to Lt as tolerated - focus on  middle and lower trap decreasing over use of upper trap - trial in various positions       Shoulder Exercises: ROM/Strengthening   UBE (Upper Arm Bike)  L3: 2 min forward, 2 min backward       Shoulder Exercises: Stretch   Wall Stretch - ABduction Limitations  T stretch at wall 30 sec x 2 reps cues for posture and alignment     Other Shoulder Stretches  3 position doorway stretch x 20 sec x 2 reps each side.       Moist Heat Therapy   Number Minutes Moist Heat  10 Minutes    Moist Heat Location  Cervical;Shoulder      Manual Therapy   Manual Therapy  --   pt supine    Joint Mobilization  GH joint mobs Grade II/III    Soft tissue mobilization  deep tissue work through the Rt shoulder  girdle musculature; upper trap; leveator; pecs; teres    Myofascial Release  anterior shoulder     Scapular Mobilization  Rt     Passive ROM  PROM Rt shoulder flexion; ER; extension; horizontal abduction              PT Education - 01/01/20 1213    Education Details  HEP    Person(s) Educated  Patient    Methods  Explanation;Demonstration;Tactile cues;Verbal cues;Handout    Comprehension  Verbalized understanding;Returned demonstration;Verbal cues required;Tactile cues required          PT Long Term Goals - 12/12/19 1143      PT LONG TERM GOAL #1   Title  I with advanced HEP to include return to rowing (12/12/2019)    Time  6    Period  Weeks    Status  Revised    Target Date  01/23/20      PT LONG TERM GOAL #2   Title  improve  Rt lower trap strength to 5/5 with no pain on resistance    Time  6    Period  Weeks    Status  Revised    Target Date  01/23/20      PT LONG TERM GOAL #3   Title  patient report reaching up and behind body with minimal to no pain    Time  6    Period  Weeks    Status  Revised    Target Date  01/23/20      PT LONG TERM GOAL #4   Time  6    Period  Weeks    Status  Revised    Target Date  01/23/20      PT LONG TERM GOAL #5   Title  demo painfree  and full Rt shoulder ROM to allow return to normal activities    Baseline  -    Time  6    Period  Weeks    Status  Revised    Target Date  01/23/20            Plan - 01/01/20 1246    Clinical Impression Statement  Continued progress with decreased pain and improved functional activities per pt report. She demonstrates increased cervical and shoulder mobility/ROM with 5/5 strength pain free with resistive testing. Tissue extensibility is improved with decresaed tightness noted through the Rt shoulder girdle. Patient continues to demonstrate overuse of upper trap with functional activities - muscular imbalance - but improves with verbal cues. She will continue to work on strengthening middle and lower trap. Patient instructed in exercise and activities for home. Will continue with independent HEP and call with any questions or problems.    PT Frequency  2x / week    PT Duration  12 weeks    PT Treatment/Interventions  Iontophoresis 36m/ml Dexamethasone;Electrical Stimulation;Cryotherapy;Ultrasound;Moist Heat;Neuromuscular re-education;Therapeutic exercise;Vasopneumatic Device;Joint Manipulations;Passive range of motion;Dry needling;Taping    PT Next Visit Plan  hold PT - patient will continue with independent HEP and call if she needs to schedule additional appointments    PT Home Exercise Plan  HEP    Consulted and Agree with Plan of Care  Patient       Patient will benefit from skilled therapeutic intervention in order to improve the following deficits and impairments:     Visit Diagnosis: Acute pain of right shoulder  Other symptoms and signs involving the musculoskeletal system  Muscle weakness (generalized)     Problem List There  are no problems to display for this patient.   [place], MPH  01/01/2020, 12:56 PM  [facility] [place], [place], [address] Phone: [phone]   Fax:   [phone]  Name: MOANA MUNFORD MRN: [id number] Date of Birth: 04-Jun-1966  PHYSICAL THERAPY DISCHARGE SUMMARY  Visits from Start of Care: 12  Current functional level related to goals / functional outcomes: See progress note for discharge status    Remaining deficits: Unknown    Education / Equipment: HEP   Plan: Patient agrees to discharge.  Patient goals were met. Patient is being discharged due to meeting the stated rehab goals.  ?????    Padraig Nhan P. Helene Kelp PT, MPH 03/12/20 12:14 PM

## 2020-01-01 NOTE — Patient Instructions (Signed)
Standing with right side toward wall  Bend elbow and place ball behind right hand holding it against wall  Squeeze shoulder blades down and back HOLD Can hold ball still against wall or press back of hand into the ball  1-2 min 3-5 reps   Be careful that upper trap does not try to do all the work   Progress exercise by turning body to left so more of your back is against the wall  May also bring ball higher on wall  Can make small SLOW circles with ball on wall

## 2020-06-05 ENCOUNTER — Other Ambulatory Visit: Payer: Self-pay | Admitting: Obstetrics and Gynecology

## 2020-06-05 DIAGNOSIS — Z1231 Encounter for screening mammogram for malignant neoplasm of breast: Secondary | ICD-10-CM

## 2020-07-03 ENCOUNTER — Ambulatory Visit: Payer: BC Managed Care – PPO

## 2020-08-06 ENCOUNTER — Encounter: Payer: Self-pay | Admitting: Physical Therapy

## 2020-08-06 ENCOUNTER — Other Ambulatory Visit: Payer: Self-pay

## 2020-08-06 ENCOUNTER — Ambulatory Visit (INDEPENDENT_AMBULATORY_CARE_PROVIDER_SITE_OTHER): Payer: BC Managed Care – PPO | Admitting: Physical Therapy

## 2020-08-06 DIAGNOSIS — R29898 Other symptoms and signs involving the musculoskeletal system: Secondary | ICD-10-CM

## 2020-08-06 DIAGNOSIS — M6281 Muscle weakness (generalized): Secondary | ICD-10-CM

## 2020-08-06 DIAGNOSIS — M25511 Pain in right shoulder: Secondary | ICD-10-CM

## 2020-08-06 DIAGNOSIS — M25521 Pain in right elbow: Secondary | ICD-10-CM

## 2020-08-06 DIAGNOSIS — G8929 Other chronic pain: Secondary | ICD-10-CM

## 2020-08-06 NOTE — Therapy (Signed)
[place], [place], [address] Phone: [phone]   Fax:  [phone]  Physical Therapy Evaluation  Patient Details  Name: Lori King MRN: [id number] Date of Birth: 1966/06/14 Referring Provider (PT): Jaynee Eagles    Encounter Date: 08/06/2020   PT End of Session - 08/06/20 1258    Visit Number 1    Number of Visits 12    Date for PT Re-Evaluation 09/17/20    Authorization Type BCBS    Authorization Time Period 08/06/20 to 09/17/20    Authorization - Visit Number 1    Authorization - Number of Visits 12    PT Start Time 0850    PT Stop Time 0930    PT Time Calculation (min) 40 min    Activity Tolerance Patient tolerated treatment well;No increased pain    Behavior During Therapy [facility] for tasks assessed/performed           History reviewed. No pertinent past medical history.  Past Surgical History:  Procedure Laterality Date  . BACK SURGERY    . BREAST BIOPSY Left   . CESAREAN SECTION    . TUBAL LIGATION      There were no vitals filed for this visit.    Subjective Assessment - 08/06/20 0852    Subjective I was here before and my shoulder got better, I stopped doing my exercises then was helping my pregnant daughter in [place] I feel like maybe I've done too much. I get a stabbing feeling in the front of my right shoulder, also a dull ache deep in the shoulder and i also get shooting pain from my elbow down to my hand and my fingers get numb. It really started getting worse again in July.    Pertinent History hx of R shoulder issues treated here    Patient Stated Goals be pain free for the first time in a year and a half    Currently in Pain? Yes    Pain Score 2    but just took ibuprofen   Pain Location Elbow    Pain Orientation Right    Pain Descriptors / Indicators Numbness;Radiating;Pins and needles    Pain Type Acute pain;Neuropathic pain    Pain Radiating Towards radiates  from elbow into pinky and ring finger    Pain Onset More than a month ago    Pain Frequency Constant    Aggravating Factors  reaching back/extending shoulder, reaching above shoulder height especially with weight    Pain Relieving Factors ibuprofen, ice, TENS at home    Effect of Pain on Daily Activities moderate limitation              [facility] PT Assessment - 08/06/20 0001      Assessment   Medical Diagnosis shoulder pain     Referring Provider (PT) Jaynee Eagles     Onset Date/Surgical Date --   re-inflamed in July 2021   Next MD Visit Dr. Delilah Shan in about a week    [facility]       Precautions   Precaution Comments none       Restrictions   Weight Bearing Restrictions No      Balance Screen   Has the patient fallen in the past 6 months No    Has the patient had a decrease in activity level because of a fear of falling?  No    Is the patient reluctant to leave  their home because of a fear of falling?  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)     Leisure boating, walking, outdoor tasks       Posture/Postural Control   Posture Comments reduced curvature all aspects of spine, considerable muslce tension globally       AROM   Overall AROM Comments cervical rotation and lateral flexion limited B, more so to the right     Right Shoulder Flexion 90 Degrees    Right Shoulder ABduction 71 Degrees    Right Shoulder Internal Rotation --   only able to get to pelvic crest, not able to reach back    Right Shoulder External Rotation --   C7   Left Shoulder Flexion 180 Degrees    Left Shoulder ABduction 180 Degrees    Left Shoulder Internal Rotation --   T6   Left Shoulder External Rotation --   T2   Right Elbow Flexion --   WNL    Right Elbow Extension --   WNL but painful extension    Left Elbow Flexion --   WNL     Left Elbow Extension --   WNL      Strength   Overall Strength Comments mild impairment in R grip strength     Right Shoulder Flexion 4/5    Right Shoulder Extension 3/5    Right Shoulder ABduction 3+/5    Right Shoulder Internal Rotation 4/5    Right Shoulder External Rotation 3+/5    Left Shoulder Flexion 5/5    Left Shoulder Extension 3+/5    Left Shoulder ABduction 5/5    Left Shoulder Internal Rotation 4+/5    Left Shoulder External Rotation 4+/5    Right Elbow Flexion 4/5    Right Elbow Extension 3-/5    Left Elbow Flexion 4+/5    Left Elbow Extension 4+/5      Palpation   Palpation comment thoracic and rotator cuff musculature very TTP and tense, thoracic PAs limited                       Objective measurements completed on examination: See above findings.               PT Education - 08/06/20 1257    Education Details exam findings, POC, HEP, possible benfeits of massage therapy on chronic condition    Person(s) Educated Patient    Methods Explanation;Demonstration    Comprehension Verbalized understanding;Returned demonstration            PT Short Term Goals - 08/06/20 1302      PT SHORT TERM GOAL #1   Title Patient to be independent in HEP to be updated PRN    Time 3    Period Weeks    Status New    Target Date 08/27/20      PT SHORT TERM GOAL #2   Title Cervical ROM to be WNL and equal on both sides in order to reduce general pain    Time 3    Period Weeks    Status New      PT SHORT TERM GOAL #3   Title R shoulder flexion and abduction AROM to be at least 120 degrees without increase in pain    Time 3  Period Weeks    Status New      PT SHORT TERM GOAL #4   Title R shoulder external rotation to reach level of at least T2 and internal rotation to reach level of at least L4    Time 3    Period Weeks    Status New             PT Long Term Goals - 08/06/20 1303      PT LONG TERM GOAL #1   Title Patient to  improve MMT by at least 1 MMT grade in order to improve functional task performance and reduce pain    Time 6    Period Weeks    Status New    Target Date 09/17/20      PT LONG TERM GOAL #2   Title Paitent to demonstrate improved posture and neuromuscular scapular control in order to reduce shoulder pain/help improve R shoulder ROM    Time 6    Period Weeks    Status New      PT LONG TERM GOAL #3   Title Patient to report R shoulder and elbow pain as being no more than 2/10 at worst    Time 6    Period Weeks    Status New      PT LONG TERM GOAL #4   Title Patient to be able to reach overhead with 8-10# with R UE with pain not exceeding 2/10    Time 6    Period Weeks    Status New                  Plan - 08/06/20 1259    Clinical Impression Statement Lori King arrives with on going shoulder and elbow pain- she reports she had been feeling better after therapy before, but had a flare this summer and has been having pain since. Exam reveals significant postural impairments including limited spinal curvature, limited cervical ROM, postural muscle tightness, scapular winging, limited R shoulder ROM and strength, R elbow hypomobility and possible ulnar nerve entrapment, and reduced functional task tolerance and performance. Will benefit from skilled PT services to address identified limitations, feel she would also benefit from massage therapy interventions moving forward.    Personal Factors and Comorbidities Behavior Pattern;Past/Current Experience;Time since onset of injury/illness/exacerbation    Examination-Activity Limitations Bathing;Bed Mobility;Reach Overhead;Caring for Others;Carry;Dressing;Hygiene/Grooming;Lift    Examination-Participation Restrictions Meal Prep;Cleaning;Occupation;Community Activity;Shop;Laundry;Yard Work    [employer]chant [employer] officer Evolving/Moderate complexity    Clinical Decision Making Moderate    Rehab Potential Good    PT Frequency 2x /  week    PT Duration 6 weeks    PT Treatment/Interventions ADLs/Self Care Home Management;Cryotherapy;Electrical Stimulation;Iontophoresis 4mg /ml Dexamethasone;Moist Heat;Ultrasound;Functional mobility training;Therapeutic activities;Therapeutic exercise;Balance training;Neuromuscular re-education;Patient/family education;Orthotic Fit/Training;Manual techniques;Passive range of motion;Dry needling;Vasopneumatic Device;Taping;Joint Manipulations;Spinal Manipulations    PT Next Visit Plan review HEP, shoulder ROM as tolerated, postural and rotator cuff strengthening, elbow mobs and general hand/UE strengthening.    PT Home Exercise Plan [email or web address]    Consulted and Agree with Plan of Care Patient           Patient will benefit from skilled therapeutic intervention in order to improve the following deficits and impairments:  Decreased range of motion, Decreased coordination, Increased fascial restricitons, Increased muscle spasms, Impaired UE functional use, Pain, Hypomobility, Impaired flexibility, Improper body mechanics, Decreased mobility, Decreased strength, Postural dysfunction  Visit Diagnosis: Chronic right shoulder pain  Muscle weakness (generalized)  Pain in right elbow  Other symptoms  and signs involving the musculoskeletal system     Problem List There are no problems to display for this patient.   Windell Norfolk, DPT, PN1   Supplemental Physical Therapist [facility]    Pager [phone] Acute Rehab Office [facility] [place], [place], [address] Phone: [phone]   Fax:  [phone]  Name: Lori King MRN: [id number] Date of Birth: 1966/04/29

## 2020-08-06 NOTE — Addendum Note (Signed)
Addended by: Hunt Oris on: 08/06/2020 01:11 PM   Modules accepted: Orders

## 2020-08-08 ENCOUNTER — Ambulatory Visit
Admission: RE | Admit: 2020-08-08 | Discharge: 2020-08-08 | Disposition: A | Discharge status: Home / Self Care | Payer: BC Managed Care – PPO | Source: Ambulatory Visit | Attending: Obstetrics and Gynecology | Admitting: Obstetrics and Gynecology

## 2020-08-08 ENCOUNTER — Encounter: Payer: Self-pay | Admitting: Physical Therapy

## 2020-08-08 ENCOUNTER — Other Ambulatory Visit: Payer: Self-pay

## 2020-08-08 ENCOUNTER — Ambulatory Visit (INDEPENDENT_AMBULATORY_CARE_PROVIDER_SITE_OTHER): Payer: BC Managed Care – PPO | Admitting: Physical Therapy

## 2020-08-08 DIAGNOSIS — M25521 Pain in right elbow: Secondary | ICD-10-CM | POA: Diagnosis not present

## 2020-08-08 DIAGNOSIS — M6281 Muscle weakness (generalized): Secondary | ICD-10-CM | POA: Diagnosis not present

## 2020-08-08 DIAGNOSIS — R29898 Other symptoms and signs involving the musculoskeletal system: Secondary | ICD-10-CM

## 2020-08-08 DIAGNOSIS — M25511 Pain in right shoulder: Secondary | ICD-10-CM

## 2020-08-08 DIAGNOSIS — Z1231 Encounter for screening mammogram for malignant neoplasm of breast: Secondary | ICD-10-CM

## 2020-08-08 DIAGNOSIS — G8929 Other chronic pain: Secondary | ICD-10-CM

## 2020-08-08 NOTE — Therapy (Signed)
[place], [place], [address] Phone: [phone]   Fax:  [phone]  Physical Therapy Treatment  Patient Details  Name: Lori King MRN: [id number] Date of Birth: 1966-01-19 Referring Provider (PT): Jaynee Eagles    Encounter Date: 08/08/2020   PT End of Session - 08/08/20 1031    Visit Number 2    Number of Visits 12    Date for PT Re-Evaluation 09/17/20    Authorization Type BCBS    Authorization Time Period 08/06/20 to 09/17/20    Authorization - Visit Number 2    Authorization - Number of Visits 12    PT Start Time 6789    PT Stop Time 3810    PT Time Calculation (min) 40 min    Activity Tolerance Patient tolerated treatment well;No increased pain    Behavior During Therapy [facility] for tasks assessed/performed           History reviewed. No pertinent past medical history.  Past Surgical History:  Procedure Laterality Date  . BACK SURGERY    . BREAST BIOPSY Left   . CESAREAN SECTION    . TUBAL LIGATION      There were no vitals filed for this visit.   Subjective Assessment - 08/08/20 0935    Subjective I'm sore today, maybe from my exercises. I have a little bit of vertigo when I lean back over the towel.    Pertinent History hx of R shoulder issues treated here    Patient Stated Goals be pain free for the first time in a year and a half    Currently in Pain? Yes    Pain Score 3     Pain Location Arm    Pain Orientation Right;Upper;Mid    Pain Descriptors / Indicators Aching;Dull;Other (Comment)   metallic   Pain Type Acute pain;Neuropathic pain    Pain Onset More than a month ago    Pain Frequency Constant                             [facility] Adult PT Treatment/Exercise - 08/08/20 0001      Shoulder Exercises: Supine   Flexion PROM;AAROM;Right;10 reps    Shoulder Flexion Weight (lbs) x10 each      Shoulder Exercises: Seated   Other Seated Exercises  cervical retraction, scapular retraction x15   5 second holds   Other Seated Exercises thoacic extension/lateral flexion/rotation x15 all planes      Manual Therapy   Manual Therapy Joint mobilization;Soft tissue mobilization;Muscle Energy Technique    Manual therapy comments separate from all other skilled services    Joint Mobilization [place] inferior mobilizations    Soft tissue mobilization STM R anterior and middle delt, biceps tendon area    Muscle Energy Technique triceps MET                  PT Education - 08/08/20 1030    Education Details exercise form and purpose, benefits of STM and intervention from NRT trained therapist given chronic condition, modifcation to thoracic extension mobility/stretch    Person(s) Educated Patient    Methods Explanation    Comprehension Verbalized understanding;Returned demonstration            PT Short Term Goals - 08/06/20 1302      PT SHORT TERM GOAL #1   Title Patient to be independent in HEP to be updated PRN  Time 3    Period Weeks    Status New    Target Date 08/27/20      PT SHORT TERM GOAL #2   Title Cervical ROM to be WNL and equal on both sides in order to reduce general pain    Time 3    Period Weeks    Status New      PT SHORT TERM GOAL #3   Title R shoulder flexion and abduction AROM to be at least 120 degrees without increase in pain    Time 3    Period Weeks    Status New      PT SHORT TERM GOAL #4   Title R shoulder external rotation to reach level of at least T2 and internal rotation to reach level of at least L4    Time 3    Period Weeks    Status New             PT Long Term Goals - 08/06/20 1303      PT LONG TERM GOAL #1   Title Patient to improve MMT by at least 1 MMT grade in order to improve functional task performance and reduce pain    Time 6    Period Weeks    Status New    Target Date 09/17/20      PT LONG TERM GOAL #2   Title Paitent to demonstrate improved posture and  neuromuscular scapular control in order to reduce shoulder pain/help improve R shoulder ROM    Time 6    Period Weeks    Status New      PT LONG TERM GOAL #3   Title Patient to report R shoulder and elbow pain as being no more than 2/10 at worst    Time 6    Period Weeks    Status New      PT LONG TERM GOAL #4   Title Patient to be able to reach overhead with 8-10# with R UE with pain not exceeding 2/10    Time 6    Period Weeks    Status New                 Plan - 08/08/20 1031    Clinical Impression Statement [name] arrives today reporting she is feeling a bit sore but good from HEP; having some dizziness with thoracic extensions over towel (possibly mild BPPV/canal issue?) so modified this exercise for her. Otherwise spent time working on thoracic mobility and posture/postural strength and shoulder ROM today. Responded well to all manual interventions with significant increase in R shoulder flexion ROM. Encouraged her to f/u with NRT trained massage therapist as she will likely really benefit from this intervention and will allow [place] to focus on other interventions. Would also benefit from upper quarter nerve mobilization techniques as tolerated.    Personal Factors and Comorbidities Behavior Pattern;Past/Current Experience;Time since onset of injury/illness/exacerbation    Examination-Activity Limitations Bathing;Bed Mobility;Reach Overhead;Caring for Others;Carry;Dressing;Hygiene/Grooming;Lift    Examination-Participation Restrictions Meal Prep;Cleaning;Occupation;Community Activity;Shop;Laundry;Yard Work    [employer] Evolving/Moderate complexity    Clinical Decision Making Moderate    Rehab Potential Good    PT Frequency 2x / week    PT Duration 6 weeks    PT Treatment/Interventions ADLs/Self Care Home Management;Cryotherapy;Electrical Stimulation;Iontophoresis 37m/ml Dexamethasone;Moist Heat;Ultrasound;Functional mobility training;Therapeutic  activities;Therapeutic exercise;Balance training;Neuromuscular re-education;Patient/family education;Orthotic Fit/Training;Manual techniques;Passive range of motion;Dry needling;Vasopneumatic Device;Taping;Joint Manipulations;Spinal Manipulations    PT Next Visit Plan continue manual interventions and shoulder  ROM, trial ulnar nerve mobilty exercises; postural and rotator cuff strength, thoracic mobility, elbow mobs, and general hand/UE strength    PT Home Exercise Plan [email or web address]    Consulted and Agree with Plan of Care Patient           Patient will benefit from skilled therapeutic intervention in order to improve the following deficits and impairments:  Decreased range of motion,Decreased coordination,Increased fascial restricitons,Increased muscle spasms,Impaired UE functional use,Pain,Hypomobility,Impaired flexibility,Improper body mechanics,Decreased mobility,Decreased strength,Postural dysfunction  Visit Diagnosis: Chronic right shoulder pain  Muscle weakness (generalized)  Pain in right elbow  Other symptoms and signs involving the musculoskeletal system     Problem List There are no problems to display for this patient.   Windell [place], DPT, PN1   Supplemental Physical Therapist [facility]    Pager [phone] Acute Rehab Office [facility] [place], [place], [address] Phone: [phone]   Fax:  [phone]  Name: CHERIE LASALLE MRN: [id number] Date of Birth: Sep 07, 1965

## 2020-08-11 ENCOUNTER — Encounter: Payer: Self-pay | Admitting: Rehabilitative and Restorative Service Providers"

## 2020-08-11 ENCOUNTER — Other Ambulatory Visit: Payer: Self-pay

## 2020-08-11 ENCOUNTER — Ambulatory Visit (INDEPENDENT_AMBULATORY_CARE_PROVIDER_SITE_OTHER): Payer: BC Managed Care – PPO | Admitting: Rehabilitative and Restorative Service Providers"

## 2020-08-11 DIAGNOSIS — R29898 Other symptoms and signs involving the musculoskeletal system: Secondary | ICD-10-CM

## 2020-08-11 DIAGNOSIS — M25521 Pain in right elbow: Secondary | ICD-10-CM | POA: Diagnosis not present

## 2020-08-11 DIAGNOSIS — M6281 Muscle weakness (generalized): Secondary | ICD-10-CM

## 2020-08-11 DIAGNOSIS — M25511 Pain in right shoulder: Secondary | ICD-10-CM

## 2020-08-11 DIAGNOSIS — G8929 Other chronic pain: Secondary | ICD-10-CM

## 2020-08-11 NOTE — Patient Instructions (Signed)
Access Code: [email or web address] URL: [email or web address] Date: 08/11/2020 Prepared by: Rudell Cobb  Exercises Standing Backward Shoulder Rolls - 2 x daily - 7 x weekly - 1 sets - 10 reps Seated Scapular Retraction - 2 x daily - 7 x weekly - 1 sets - 10 reps - 10 hold Seated Thoracic Flexion and Extension - 2 x daily - 7 x weekly - 1 sets - 10 reps - 1 hold Supine Elbow Flexion Extension with Dumbbell - 2 x daily - 7 x weekly - 1 sets - 10 reps Supine Shoulder Press AAROM in Abduction with Dowel - 2 x daily - 7 x weekly - 1 sets - 10 reps Supine Thoracic and Chest Stretch on Rolled Towel - 2 x daily - 7 x weekly - 1 sets - 1 reps - 2 minutes hold Prone Scapular Slide with Shoulder Extension - 2 x daily - 7 x weekly - 1 sets - 10 reps  Patient Education Trigger Point Dry Needling

## 2020-08-11 NOTE — Therapy (Signed)
[place], [place], [address] Phone: [phone]   Fax:  [phone]  Physical Therapy Treatment  Patient Details  Name: Lori King MRN: [id number] Date of Birth: Dec 18, 1965 Referring Provider (PT): Jaynee Eagles    Encounter Date: 08/11/2020   PT End of Session - 08/11/20 0846    Visit Number 3    Number of Visits 12    Date for PT Re-Evaluation 09/17/20    Authorization Type BCBS    Authorization Time Period 08/06/20 to 09/17/20    Authorization - Visit Number 3    Authorization - Number of Visits 12    PT Start Time 0848    PT Stop Time 0930    PT Time Calculation (min) 42 min    Activity Tolerance Patient tolerated treatment well;No increased pain    Behavior During Therapy [facility] for tasks assessed/performed           History reviewed. No pertinent past medical history.  Past Surgical History:  Procedure Laterality Date  . BACK SURGERY    . BREAST BIOPSY Left   . CESAREAN SECTION    . TUBAL LIGATION      There were no vitals filed for this visit.   Subjective Assessment - 08/11/20 0852    Subjective The patient reports that she had worse pain yesterday feeling like she overdid her ther ex the night before.  Her R shoulder and her R elbow are both flared up.    Pertinent History hx of R shoulder issues treated here    Patient Stated Goals be pain free for the first time in a year and a half    Currently in Pain? Yes    Pain Score 2     Pain Location Shoulder    Pain Orientation Right;Upper;Mid    Pain Descriptors / Indicators Aching;Dull;Tightness    Pain Type Acute pain;Neuropathic pain    Pain Onset More than a month ago    Pain Frequency Constant    Aggravating Factors  worse yesterday at a 5/10 from too much ther ex    Pain Relieving Factors ice, TENS              [facility] PT Assessment - 08/11/20 0855      Assessment   Medical Diagnosis shoulder pain      Referring Provider (PT) Jaynee Eagles       AROM   Right Shoulder Flexion 120 Degrees                         [facility] Adult PT Treatment/Exercise - 08/11/20 0855      Exercises   Exercises Shoulder      Shoulder Exercises: Supine   Flexion PROM;AAROM;Right;5 reps    Flexion Limitations modified from AAROM overhead to a shoulder press to avoid continued shoulder irritation.    Other Supine Exercises Supine shoulder flexion to 90 deg and then added elbow flexion/extension      Shoulder Exercises: Seated   Other Seated Exercises seated scapular retraction x 10 reps; seated shoulder rolls    Other Seated Exercises thoacic extension/lateral flexion/rotation x15 all planes      Shoulder Exercises: Prone   Retraction Strengthening;Both;10 reps    Extension Strengthening;Right;10 reps      Shoulder Exercises: Sidelying   Internal Rotation PROM;Right;5 reps    Internal Rotation Limitations for gravity assisted stretch    ABduction PROM;Right;5 reps  Shoulder Exercises: Isometric Strengthening   External Rotation 3X3";Supine    Internal Rotation 3X3";Supine    ADduction 3X3";Supine      Manual Therapy   Manual Therapy Joint mobilization;Soft tissue mobilization;Myofascial release;Scapular mobilization    Manual therapy comments to reduce soft tissue tightness and improve ROM; skilled palpation to assess soft tissue response to DN    Joint Mobilization GH inferior mob grade II, gentle distraction grade I-II    Soft tissue mobilization STM R anterior and middle deltoid, STM biceps and IASTM,    Myofascial Release pectoralis    Scapular Mobilization sidelying R scapular mobilization            Trigger Point Dry Needling - 08/11/20 0902    Consent Given? Yes    Education Handout Provided Yes    Muscles Treated Upper Quadrant Deltoid;Biceps;Triceps    Muscles Treated Wrist/Hand Flexor carpi ulnaris    Dry Needling Comments right UE    Deltoid Response Palpable  increased muscle length    Biceps Response Palpable increased muscle length    Triceps Response Palpable increased muscle length    Flexor carpi ulnaris Response Palpable increased muscle length                PT Education - 08/11/20 0934    Education Details HEP modified    Person(s) Educated Patient    Methods Explanation;Demonstration;Handout    Comprehension Verbalized understanding;Returned demonstration            PT Short Term Goals - 08/06/20 1302      PT SHORT TERM GOAL #1   Title Patient to be independent in HEP to be updated PRN    Time 3    Period Weeks    Status New    Target Date 08/27/20      PT SHORT TERM GOAL #2   Title Cervical ROM to be WNL and equal on both sides in order to reduce general pain    Time 3    Period Weeks    Status New      PT SHORT TERM GOAL #3   Title R shoulder flexion and abduction AROM to be at least 120 degrees without increase in pain    Time 3    Period Weeks    Status New      PT SHORT TERM GOAL #4   Title R shoulder external rotation to reach level of at least T2 and internal rotation to reach level of at least L4    Time 3    Period Weeks    Status New             PT Long Term Goals - 08/06/20 1303      PT LONG TERM GOAL #1   Title Patient to improve MMT by at least 1 MMT grade in order to improve functional task performance and reduce pain    Time 6    Period Weeks    Status New    Target Date 09/17/20      PT LONG TERM GOAL #2   Title Paitent to demonstrate improved posture and neuromuscular scapular control in order to reduce shoulder pain/help improve R shoulder ROM    Time 6    Period Weeks    Status New      PT LONG TERM GOAL #3   Title Patient to report R shoulder and elbow pain as being no more than 2/10 at worst    Time 6  Period Weeks    Status New      PT LONG TERM GOAL #4   Title Patient to be able to reach overhead with 8-10# with R UE with pain not exceeding 2/10    Time 6     Period Weeks    Status New                 Plan - 08/11/20 1337    Clinical Impression Statement The patient is continuing with R shoulder pain and limited ROM.  She sees MD tomorrow and inquires about need for surgery versus ongoing therapy.  PT reports that we should see some progress with conservative measures within 6-8 visits, however her condition is chronic in nature, which may take greater time to help reduce inflammation.  Plan to continue to patient tolerance working ot [place].    Rehab Potential Good    PT Frequency 2x / week    PT Duration 6 weeks    PT Treatment/Interventions ADLs/Self Care Home Management;Cryotherapy;Electrical Stimulation;Iontophoresis 4mg /ml Dexamethasone;Moist Heat;Ultrasound;Functional mobility training;Therapeutic activities;Therapeutic exercise;Balance training;Neuromuscular re-education;Patient/family education;Orthotic Fit/Training;Manual techniques;Passive range of motion;Dry needling;Vasopneumatic Device;Taping;Joint Manipulations;Spinal Manipulations    PT Next Visit Plan continue manual interventions and shoulder ROM, trial ulnar nerve mobilty exercises; postural and rotator cuff strength, thoracic mobility, elbow mobs, and general hand/UE strength    PT Home Exercise Plan [email or web address]    Consulted and Agree with Plan of Care Patient           Patient will benefit from skilled therapeutic intervention in order to improve the following deficits and impairments:  Decreased range of motion,Decreased coordination,Increased fascial restricitons,Increased muscle spasms,Impaired UE functional use,Pain,Hypomobility,Impaired flexibility,Improper body mechanics,Decreased mobility,Decreased strength,Postural dysfunction  Visit Diagnosis: Chronic right shoulder pain  Muscle weakness (generalized)  Pain in right elbow  Other symptoms and signs involving the musculoskeletal system     Problem List There are no problems to display for this  patient.   [place] , [place] 08/11/2020, 1:39 PM  [facility] [place], [place], [address] Phone: [phone]   Fax:  [phone]  Name: CYRILLA DURKIN MRN: [id number] Date of Birth: June 12, 1966

## 2020-08-13 ENCOUNTER — Other Ambulatory Visit: Payer: Self-pay | Admitting: Obstetrics and Gynecology

## 2020-08-13 DIAGNOSIS — N6489 Other specified disorders of breast: Secondary | ICD-10-CM

## 2020-08-13 DIAGNOSIS — N63 Unspecified lump in unspecified breast: Secondary | ICD-10-CM

## 2020-08-14 ENCOUNTER — Encounter: Payer: Self-pay | Admitting: Physical Therapy

## 2020-08-14 ENCOUNTER — Ambulatory Visit (INDEPENDENT_AMBULATORY_CARE_PROVIDER_SITE_OTHER): Payer: BC Managed Care – PPO | Admitting: Physical Therapy

## 2020-08-14 DIAGNOSIS — M6281 Muscle weakness (generalized): Secondary | ICD-10-CM

## 2020-08-14 DIAGNOSIS — R29898 Other symptoms and signs involving the musculoskeletal system: Secondary | ICD-10-CM

## 2020-08-14 DIAGNOSIS — M25521 Pain in right elbow: Secondary | ICD-10-CM

## 2020-08-14 DIAGNOSIS — G8929 Other chronic pain: Secondary | ICD-10-CM

## 2020-08-14 DIAGNOSIS — M25511 Pain in right shoulder: Secondary | ICD-10-CM | POA: Diagnosis not present

## 2020-08-14 NOTE — Therapy (Signed)
[place], [place], [address] Phone: [phone]   Fax:  [phone]  Physical Therapy Treatment  Patient Details  Name: Lori King MRN: [id number] Date of Birth: 08-03-1966 Referring Provider (PT): Lori King    Encounter Date: 08/14/2020   PT End of Session - 08/14/20 0932    Visit Number 4    Number of Visits 12    Date for PT Re-Evaluation 09/17/20    Authorization Type BCBS    Authorization Time Period 08/06/20 to 09/17/20    Authorization - Visit Number 4    Authorization - Number of Visits 12    PT Start Time 0933    PT Stop Time 1024    PT Time Calculation (min) 51 min    Activity Tolerance Patient tolerated treatment well    Behavior During Therapy [facility] for tasks assessed/performed           History reviewed. No pertinent past medical history.  Past Surgical History:  Procedure Laterality Date   BACK SURGERY     BREAST BIOPSY Left    CESAREAN SECTION     TUBAL LIGATION      There were no vitals filed for this visit.   Subjective Assessment - 08/14/20 0934    Subjective Pt saw the MD and told her he thinks she should have surgery for frozen shoulder to manipulate it and clean up the shoulder. Pt reports she has to have a [place] on her Lt breast because they found a lump.    Pertinent History hx of R shoulder issues treated here    Patient Stated Goals be pain free for the first time in a year and a half    Currently in Pain? Yes    Pain Score 5    then decreased with icing   Pain Location Shoulder    Pain Orientation Right    Pain Descriptors / Indicators Aching;Stabbing    Pain Type Acute pain    Pain Onset More than a month ago    Pain Frequency Constant    Aggravating Factors  using the arm and sleeping on the Rt side    Pain Relieving Factors ice              [facility] PT Assessment - 08/14/20 0001      AROM   Right Shoulder Flexion 108 Degrees    Right  Shoulder ABduction 71 Degrees                         [facility] Adult PT Treatment/Exercise - 08/14/20 0001      Exercises   Exercises Shoulder      Shoulder Exercises: Supine   Flexion AAROM;Right;10 reps   with dowel   Other Supine Exercises hands behind head, elbow presses down      Shoulder Exercises: ROM/Strengthening   UBE (Upper Arm Bike) L2x5' alt FWD/BWD      Modalities   Modalities Moist Heat      Moist Heat Therapy   Number Minutes Moist Heat 10 Minutes    Moist Heat Location Shoulder   rt - first 5' with 1# stretch into ER lying in supine     Manual Therapy   Soft tissue mobilization STM to Rt shoulder - pecs and posterior shoulder/RTC    Myofascial Release pectoralis            Trigger Point Dry Needling - 08/14/20 0001  Consent Given? Yes    Education Handout Provided Previously provided    Muscles Treated Upper Quadrant Pectoralis minor;Pectoralis major;Teres major;Teres minor    Pectoralis Major Response Palpable increased muscle length;Twitch response elicited   rt   Pectoralis Minor Response Palpable increased muscle length;Twitch response elicited   rt   Teres major Response Palpable increased muscle length;Twitch response elicited   rt   Teres minor Response Palpable increased muscle length;Twitch response elicited   rt                 PT Short Term Goals - 08/06/20 1302      PT SHORT TERM GOAL #1   Title Patient to be independent in HEP to be updated PRN    Time 3    Period Weeks    Status New    Target Date 08/27/20      PT SHORT TERM GOAL #2   Title Cervical ROM to be WNL and equal on both sides in order to reduce general pain    Time 3    Period Weeks    Status New      PT SHORT TERM GOAL #3   Title R shoulder flexion and abduction AROM to be at least 120 degrees without increase in pain    Time 3    Period Weeks    Status New      PT SHORT TERM GOAL #4   Title R shoulder external rotation to reach level of at  least T2 and internal rotation to reach level of at least L4    Time 3    Period Weeks    Status New             PT Long Term Goals - 08/06/20 1303      PT LONG TERM GOAL #1   Title Patient to improve MMT by at least 1 MMT grade in order to improve functional task performance and reduce pain    Time 6    Period Weeks    Status New    Target Date 09/17/20      PT LONG TERM GOAL #2   Title Paitent to demonstrate improved posture and neuromuscular scapular control in order to reduce shoulder pain/help improve R shoulder ROM    Time 6    Period Weeks    Status New      PT LONG TERM GOAL #3   Title Patient to report R shoulder and elbow pain as being no more than 2/10 at worst    Time 6    Period Weeks    Status New      PT LONG TERM GOAL #4   Title Patient to be able to reach overhead with 8-10# with R UE with pain not exceeding 2/10    Time 6    Period Weeks    Status New                 Plan - 08/14/20 1041    Clinical Impression Statement Lori King reports that she had about 24 hrs of decreased shoulder pain after her DN session.  She saw MD and he is recommending surgery. Pt leaning towards this however wants to have the [place] of her Lt breast first.  Recommend she attend a couple more therapy sessions and have DN to see if results of improved ROM will last.    Rehab Potential Good    PT Frequency 2x / week    PT Duration  6 weeks    PT Treatment/Interventions ADLs/Self Care Home Management;Cryotherapy;Electrical Stimulation;Iontophoresis 4mg /ml Dexamethasone;Moist Heat;Ultrasound;Functional mobility training;Therapeutic activities;Therapeutic exercise;Balance training;Neuromuscular re-education;Patient/family education;Orthotic Fit/Training;Manual techniques;Passive range of motion;Dry needling;Vasopneumatic Device;Taping;Joint Manipulations;Spinal Manipulations    PT Next Visit Plan continue manual interventions and shoulder ROM, trial ulnar nerve mobilty exercises;  postural and rotator cuff strength, thoracic mobility, elbow mobs, and general hand/UE strength    PT Home Exercise Plan [email or web address]    Consulted and Agree with Plan of Care Patient           Patient will benefit from skilled therapeutic intervention in order to improve the following deficits and impairments:  Decreased range of motion,Decreased coordination,Increased fascial restricitons,Increased muscle spasms,Impaired UE functional use,Pain,Hypomobility,Impaired flexibility,Improper body mechanics,Decreased mobility,Decreased strength,Postural dysfunction  Visit Diagnosis: Chronic right shoulder pain  Muscle weakness (generalized)  Pain in right elbow  Other symptoms and signs involving the musculoskeletal system     Problem List There are no problems to display for this patient.   Manuela Schwartz Karie Skowron PT  08/14/2020, 10:44 AM  [facility] [place], [place], [address] Phone: [phone]   Fax:  [phone]  Name: Lori King MRN: [id number] Date of Birth: Dec 30, 1965

## 2020-08-18 ENCOUNTER — Other Ambulatory Visit: Payer: Self-pay

## 2020-08-18 ENCOUNTER — Encounter: Payer: Self-pay | Admitting: Physical Therapy

## 2020-08-18 ENCOUNTER — Ambulatory Visit (INDEPENDENT_AMBULATORY_CARE_PROVIDER_SITE_OTHER): Payer: BC Managed Care – PPO | Admitting: Physical Therapy

## 2020-08-18 DIAGNOSIS — M25521 Pain in right elbow: Secondary | ICD-10-CM | POA: Diagnosis not present

## 2020-08-18 DIAGNOSIS — M6281 Muscle weakness (generalized): Secondary | ICD-10-CM | POA: Diagnosis not present

## 2020-08-18 DIAGNOSIS — R29898 Other symptoms and signs involving the musculoskeletal system: Secondary | ICD-10-CM | POA: Diagnosis not present

## 2020-08-18 DIAGNOSIS — M25511 Pain in right shoulder: Secondary | ICD-10-CM

## 2020-08-18 DIAGNOSIS — G8929 Other chronic pain: Secondary | ICD-10-CM

## 2020-08-18 NOTE — Therapy (Signed)
[facility] [place], [place], [address] Phone: [phone]   Fax:  [phone]  Physical Therapy Treatment  Patient Details  Name: Lori King MRN: [id number] Date of Birth: 1966-05-02 Referring Provider (PT): Jaynee Eagles    Encounter Date: 08/18/2020   PT End of Session - 08/18/20 0946    Visit Number 5    Number of Visits 12    Date for PT Re-Evaluation 09/17/20    Authorization Type [employer]    Authorization Time Period 08/06/20 to 09/17/20    Authorization - Visit Number 5    Authorization - Number of Visits 12    PT Start Time 0932    PT Stop Time 1021    PT Time Calculation (min) 49 min    Activity Tolerance Patient tolerated treatment well;Patient limited by pain    Behavior During Therapy [facility] for tasks assessed/performed           History reviewed. No pertinent past medical history.  Past Surgical History:  Procedure Laterality Date  . BACK SURGERY    . BREAST BIOPSY Left   . CESAREAN SECTION    . TUBAL LIGATION      There were no vitals filed for this visit.   Subjective Assessment - 08/18/20 0934    Subjective Pt reports she was tired after her last visit of DN, doesnt' feel like her ROM improved,still sore through the shoulder. Getting her breast US on Thursday    Patient Stated Goals be pain free for the first time in a year and a half    Currently in Pain? Yes    Pain Score 5     Pain Location Elbow    Pain Orientation Right    Pain Descriptors / Indicators --   nerve pain with numbness into fourth and fifth fingers   Pain Type Acute pain              [facility] PT Assessment - 08/18/20 0001      Assessment   Medical Diagnosis shoulder pain     Referring Provider (PT) Adam Kendell       AROM   Overall AROM Comments cervical rotation Rt 65, Lt 75    Right Shoulder Flexion 130 Degrees   in supine with isometric ER hold                        [facility] Adult  PT Treatment/Exercise - 08/18/20 0001      Elbow Exercises   Elbow Flexion --   wrist flexion /extension stretches.     Shoulder Exercises: Supine   External Rotation Strengthening;Both;15 reps;Theraband    Theraband Level (Shoulder External Rotation) Level 2 (Red)    External Rotation Limitations isometrics elbows by side    Flexion 15 reps;Theraband    Theraband Level (Shoulder Flexion) Level 2 (Red)    Flexion Limitations with band around wrists - isometric hold , flex to 90 degrees      Shoulder Exercises: Standing   Other Standing Exercises standing sink stretch for shoulder flex - felt a lot into her Rt lats      Moist Heat Therapy   Number Minutes Moist Heat 10 Minutes    Moist Heat Location Shoulder      Manual Therapy   Manual therapy comments PROM Rt shoulder in all planes    Joint Mobilization GH inferior mob grade II, gentle distraction grade I-II  Soft tissue mobilization STM to Rt biceps            Trigger Point Dry Needling - 08/18/20 0001    Consent Given? Yes    Education Handout Provided Previously provided    Electrical Stimulation Performed with Dry Needling Yes    E-stim with Dry Needling Details to deltoid    Deltoid Response Palpable increased muscle length;Twitch response elicited   very reactive   Biceps Response Palpable increased muscle length                  PT Short Term Goals - 08/18/20 0947      PT SHORT TERM GOAL #1   Title Patient to be independent in HEP to be updated PRN    Status Achieved      PT SHORT TERM GOAL #2   Title Cervical ROM to be WNL and equal on both sides in order to reduce general pain      PT SHORT TERM GOAL #3   Title R shoulder flexion and abduction AROM to be at least 120 degrees without increase in pain    Status On-going      PT SHORT TERM GOAL #4   Title R shoulder external rotation to reach level of at least T2 and internal rotation to reach level of at least L4    Status On-going              PT Long Term Goals - 08/06/20 1303      PT LONG TERM GOAL #1   Title Patient to improve MMT by at least 1 MMT grade in order to improve functional task performance and reduce pain    Time 6    Period Weeks    Status New    Target Date 09/17/20      PT LONG TERM GOAL #2   Title Paitent to demonstrate improved posture and neuromuscular scapular control in order to reduce shoulder pain/help improve R shoulder ROM    Time 6    Period Weeks    Status New      PT LONG TERM GOAL #3   Title Patient to report R shoulder and elbow pain as being no more than 2/10 at worst    Time 6    Period Weeks    Status New      PT LONG TERM GOAL #4   Title Patient to be able to reach overhead with 8-10# with R UE with pain not exceeding 2/10    Time 6    Period Weeks    Status New                 Plan - 08/18/20 1013    Clinical Impression Statement Lori King has decided to have the shoulder surgery, she is waiting for the scheduler to call her back.  Her shoulder ROM continues to be very limited with minimal improvement after treatment however no lasting increased.  She may benefit from stopping PT since minimal progress is being made and she is going to procede with surgery.  She is going to think about coming for possilbly one more session to ensure she has ther ex to perform or stopping after todays session.  She will call with decision    Rehab Potential Good    PT Frequency 2x / week    PT Duration 6 weeks    PT Treatment/Interventions ADLs/Self Care Home Management;Cryotherapy;Electrical Stimulation;Iontophoresis 4mg /ml Dexamethasone;Moist Heat;Ultrasound;Functional mobility training;Therapeutic activities;Therapeutic exercise;Balance training;Neuromuscular  re-education;Patient/family education;Orthotic Fit/Training;Manual techniques;Passive range of motion;Dry needling;Vasopneumatic Device;Taping;Joint Manipulations;Spinal Manipulations    PT Next Visit Plan finalize HEP vs D/C     Consulted and Agree with Plan of Care Patient           Patient will benefit from skilled therapeutic intervention in order to improve the following deficits and impairments:  Decreased range of motion,Decreased coordination,Increased fascial restricitons,Increased muscle spasms,Impaired UE functional use,Pain,Hypomobility,Impaired flexibility,Improper body mechanics,Decreased mobility,Decreased strength,Postural dysfunction  Visit Diagnosis: Chronic right shoulder pain  Muscle weakness (generalized)  Pain in right elbow  Other symptoms and signs involving the musculoskeletal system  Acute pain of right shoulder     Problem List There are no problems to display for this patient.   Lori King PT 08/18/2020, 10:16 AM  [facility] [place], [place], [address] Phone: [phone]   Fax:  [phone]  Name: Lori King MRN: [id number] Date of Birth: 11-02-1965

## 2020-08-21 ENCOUNTER — Other Ambulatory Visit: Payer: Self-pay

## 2020-08-21 ENCOUNTER — Ambulatory Visit
Admission: RE | Admit: 2020-08-21 | Discharge: 2020-08-21 | Disposition: A | Discharge status: Home / Self Care | Payer: BC Managed Care – PPO | Source: Ambulatory Visit | Attending: Obstetrics and Gynecology | Admitting: Obstetrics and Gynecology

## 2020-08-21 ENCOUNTER — Encounter: Payer: BC Managed Care – PPO | Admitting: Physical Therapy

## 2020-08-21 DIAGNOSIS — N63 Unspecified lump in unspecified breast: Secondary | ICD-10-CM

## 2020-08-21 DIAGNOSIS — N6489 Other specified disorders of breast: Secondary | ICD-10-CM

## 2020-08-25 ENCOUNTER — Other Ambulatory Visit: Payer: BC Managed Care – PPO

## 2020-08-25 ENCOUNTER — Encounter: Payer: BC Managed Care – PPO | Admitting: Physical Therapy

## 2020-08-26 ENCOUNTER — Other Ambulatory Visit: Payer: Self-pay

## 2020-08-26 ENCOUNTER — Encounter (HOSPITAL_BASED_OUTPATIENT_CLINIC_OR_DEPARTMENT_OTHER): Payer: Self-pay | Admitting: Orthopedic Surgery

## 2020-08-26 NOTE — Progress Notes (Addendum)
ADDENDUM:  Called and spoke w/ pt via phone informed her that Dr Aundria Rud had a cancellation and would like to start her surgery at 0730.  Pt stated that would be great and verbalized understanding to arrive at 0530 and clear liquids from MN until 0430.   Spoke w/ via phone for pre-op interview--- PT Lab needs dos----   Urine preg            Lab results------ no COVID test ------ 08-26-2020 @ 1020 Arrive at ------- 0945 NPO after MN NO Solid Food.  Clear liquids from MN until--- 0845 Medications to take morning of surgery ----- Wellbutrin, Claritin, Flonase spray Diabetic medication ----- n/a Patient Special Instructions ----- n/a Pre-Op special Istructions ----- n/a Patient verbalized understanding of instructions that were given at this phone interview. Patient denies shortness of breath, chest pain, fever, cough at this phone interview.

## 2020-08-27 ENCOUNTER — Other Ambulatory Visit (HOSPITAL_COMMUNITY)
Admission: RE | Admit: 2020-08-27 | Discharge: 2020-08-27 | Disposition: A | Discharge status: Home / Self Care | Payer: BC Managed Care – PPO | Source: Ambulatory Visit | Attending: Orthopedic Surgery | Admitting: Orthopedic Surgery

## 2020-08-27 DIAGNOSIS — Z880 Allergy status to penicillin: Secondary | ICD-10-CM | POA: Diagnosis not present

## 2020-08-27 DIAGNOSIS — Z91013 Allergy to seafood: Secondary | ICD-10-CM | POA: Diagnosis not present

## 2020-08-27 DIAGNOSIS — M7501 Adhesive capsulitis of right shoulder: Secondary | ICD-10-CM | POA: Diagnosis not present

## 2020-08-27 DIAGNOSIS — Z01812 Encounter for preprocedural laboratory examination: Secondary | ICD-10-CM | POA: Insufficient documentation

## 2020-08-27 DIAGNOSIS — Z881 Allergy status to other antibiotic agents status: Secondary | ICD-10-CM | POA: Diagnosis not present

## 2020-08-27 DIAGNOSIS — Z79899 Other long term (current) drug therapy: Secondary | ICD-10-CM | POA: Diagnosis not present

## 2020-08-27 DIAGNOSIS — Z20822 Contact with and (suspected) exposure to covid-19: Secondary | ICD-10-CM | POA: Diagnosis not present

## 2020-08-27 DIAGNOSIS — Z803 Family history of malignant neoplasm of breast: Secondary | ICD-10-CM | POA: Diagnosis not present

## 2020-08-27 LAB — SARS CORONAVIRUS 2 (TAT 6-24 HRS): SARS Coronavirus 2: NEGATIVE

## 2020-08-27 NOTE — Anesthesia Preprocedure Evaluation (Addendum)
Anesthesia Evaluation  Patient identified by MRN, date of birth, ID band Patient awake    Reviewed: Allergy & Precautions, H&P , NPO status , Patient's Chart, lab work & pertinent test results  Airway Mallampati: II  TM Distance: >3 FB Neck ROM: Full    Dental no notable dental hx. (+) Teeth Intact, Dental Advisory Given   Pulmonary neg pulmonary ROS,    Pulmonary exam normal breath sounds clear to auscultation       Cardiovascular Exercise Tolerance: Good negative cardio ROS Normal cardiovascular exam Rhythm:Regular Rate:Normal     Neuro/Psych PSYCHIATRIC DISORDERS Anxiety Depression negative neurological ROS     GI/Hepatic negative GI ROS, Neg liver ROS,   Endo/Other  negative endocrine ROS  Renal/GU negative Renal ROS  negative genitourinary   Musculoskeletal  (+) Arthritis , Osteoarthritis,    Abdominal   Peds negative pediatric ROS (+)  Hematology  (+) Blood dyscrasia, anemia ,   Anesthesia Other Findings   Reproductive/Obstetrics negative OB ROS                            Anesthesia Physical Anesthesia Plan  ASA: II  Anesthesia Plan: General   Post-op Pain Management: GA combined w/ Regional for post-op pain   Induction:   PONV Risk Score and Plan: 3 and Ondansetron, Dexamethasone, Midazolam and Treatment may vary due to age or medical condition  Airway Management Planned: Oral ETT and LMA  Additional Equipment: None  Intra-op Plan:   Post-operative Plan: Extubation in OR  Informed Consent: I have reviewed the patients History and Physical, chart, labs and discussed the procedure including the risks, benefits and alternatives for the proposed anesthesia with the patient or authorized representative who has indicated his/her understanding and acceptance.     Dental advisory given  Plan Discussed with: Anesthesiologist and CRNA  Anesthesia Plan Comments: (Discussed  both nerve block for pain relief post-op and GA; including NV, sore throat, dental injury, and pulmonary complications)        Anesthesia Quick Evaluation

## 2020-08-28 ENCOUNTER — Ambulatory Visit (HOSPITAL_BASED_OUTPATIENT_CLINIC_OR_DEPARTMENT_OTHER): Payer: BC Managed Care – PPO | Admitting: Anesthesiology

## 2020-08-28 ENCOUNTER — Ambulatory Visit (HOSPITAL_BASED_OUTPATIENT_CLINIC_OR_DEPARTMENT_OTHER)
Admission: RE | Admit: 2020-08-28 | Discharge: 2020-08-28 | Disposition: A | Discharge status: Home / Self Care | Payer: BC Managed Care – PPO | Attending: Orthopedic Surgery | Admitting: Orthopedic Surgery

## 2020-08-28 ENCOUNTER — Encounter (HOSPITAL_BASED_OUTPATIENT_CLINIC_OR_DEPARTMENT_OTHER): Payer: Self-pay | Admitting: Orthopedic Surgery

## 2020-08-28 ENCOUNTER — Encounter (HOSPITAL_BASED_OUTPATIENT_CLINIC_OR_DEPARTMENT_OTHER)
Admission: RE | Disposition: A | Discharge status: Home / Self Care | Payer: Self-pay | Source: Home / Self Care | Attending: Orthopedic Surgery

## 2020-08-28 ENCOUNTER — Encounter: Payer: BC Managed Care – PPO | Admitting: Rehabilitative and Restorative Service Providers"

## 2020-08-28 DIAGNOSIS — Z20822 Contact with and (suspected) exposure to covid-19: Secondary | ICD-10-CM | POA: Insufficient documentation

## 2020-08-28 DIAGNOSIS — Z880 Allergy status to penicillin: Secondary | ICD-10-CM | POA: Insufficient documentation

## 2020-08-28 DIAGNOSIS — M7501 Adhesive capsulitis of right shoulder: Secondary | ICD-10-CM | POA: Diagnosis not present

## 2020-08-28 DIAGNOSIS — Z881 Allergy status to other antibiotic agents status: Secondary | ICD-10-CM | POA: Insufficient documentation

## 2020-08-28 DIAGNOSIS — Z79899 Other long term (current) drug therapy: Secondary | ICD-10-CM | POA: Insufficient documentation

## 2020-08-28 DIAGNOSIS — Z803 Family history of malignant neoplasm of breast: Secondary | ICD-10-CM | POA: Insufficient documentation

## 2020-08-28 DIAGNOSIS — Z91013 Allergy to seafood: Secondary | ICD-10-CM | POA: Insufficient documentation

## 2020-08-28 HISTORY — DX: Other seasonal allergic rhinitis: J30.2

## 2020-08-28 HISTORY — DX: Anemia, unspecified: D64.9

## 2020-08-28 HISTORY — DX: Major depressive disorder, single episode, unspecified: F32.9

## 2020-08-28 HISTORY — DX: Presence of spectacles and contact lenses: Z97.3

## 2020-08-28 HISTORY — DX: Adhesive capsulitis of right shoulder: M75.01

## 2020-08-28 HISTORY — DX: Anxiety disorder, unspecified: F41.9

## 2020-08-28 HISTORY — PX: SHOULDER ARTHROSCOPY: SHX128

## 2020-08-28 LAB — POCT PREGNANCY, URINE: Preg Test, Ur: NEGATIVE

## 2020-08-28 SURGERY — ARTHROSCOPY, SHOULDER
Anesthesia: General | Site: Shoulder | Laterality: Right

## 2020-08-28 MED ORDER — ONDANSETRON 4 MG PO TBDP: 4.0000 mg | ORAL_TABLET | Freq: Three times a day (TID) | ORAL | 0 refills | Status: DC | PRN

## 2020-08-28 MED ORDER — MENTHOL 3 MG MT LOZG
1.0000 | LOZENGE | Freq: Once | OROMUCOSAL | Status: AC
  Administered 2020-08-28: 10:00:00 3 mg via ORAL

## 2020-08-28 MED ORDER — FENTANYL CITRATE (PF) 100 MCG/2ML IJ SOLN
25.0000 ug | INTRAMUSCULAR | Status: DC | PRN
Start: 2020-08-28 — End: 2020-08-28

## 2020-08-28 MED ORDER — PROPOFOL 10 MG/ML IV BOLUS
INTRAVENOUS | Status: DC | PRN
  Administered 2020-08-28: 160 mg via INTRAVENOUS

## 2020-08-28 MED ORDER — OXYCODONE HCL 5 MG PO TABS: 5.0000 mg | ORAL_TABLET | ORAL | 0 refills | Status: AC | PRN

## 2020-08-28 MED ORDER — BUPIVACAINE LIPOSOME 1.3 % IJ SUSP
INTRAMUSCULAR | Status: DC | PRN
  Administered 2020-08-28: 10 mL via PERINEURAL

## 2020-08-28 MED ORDER — ACETAMINOPHEN 325 MG PO TABS: 325.0000 mg | ORAL_TABLET | ORAL | Status: DC | PRN

## 2020-08-28 MED ORDER — DEXAMETHASONE SODIUM PHOSPHATE 10 MG/ML IJ SOLN
INTRAMUSCULAR | Status: AC
  Filled 2020-08-28: qty 1

## 2020-08-28 MED ORDER — SODIUM CHLORIDE 0.9 % IR SOLN
Status: DC | PRN
  Administered 2020-08-28 (×2): 3000 mL

## 2020-08-28 MED ORDER — CEFAZOLIN SODIUM-DEXTROSE 2-4 GM/100ML-% IV SOLN
2.0000 g | INTRAVENOUS | Status: AC
  Administered 2020-08-28: 2 g via INTRAVENOUS

## 2020-08-28 MED ORDER — LIDOCAINE HCL (PF) 2 % IJ SOLN
INTRAMUSCULAR | Status: AC
  Filled 2020-08-28: qty 5

## 2020-08-28 MED ORDER — OXYCODONE HCL 5 MG/5ML PO SOLN: 5.0000 mg | Freq: Once | ORAL | Status: DC | PRN

## 2020-08-28 MED ORDER — ACETAMINOPHEN 160 MG/5ML PO SOLN: 325.0000 mg | ORAL | Status: DC | PRN

## 2020-08-28 MED ORDER — MIDAZOLAM HCL 2 MG/2ML IJ SOLN
INTRAMUSCULAR | Status: AC
  Filled 2020-08-28: qty 2

## 2020-08-28 MED ORDER — MEPERIDINE HCL 25 MG/ML IJ SOLN: 6.2500 mg | INTRAMUSCULAR | Status: DC | PRN

## 2020-08-28 MED ORDER — ROCURONIUM BROMIDE 10 MG/ML (PF) SYRINGE
PREFILLED_SYRINGE | INTRAVENOUS | Status: AC
  Filled 2020-08-28: qty 10

## 2020-08-28 MED ORDER — FENTANYL CITRATE (PF) 100 MCG/2ML IJ SOLN
INTRAMUSCULAR | Status: AC
  Filled 2020-08-28: qty 2

## 2020-08-28 MED ORDER — FENTANYL CITRATE (PF) 100 MCG/2ML IJ SOLN
100.0000 ug | Freq: Once | INTRAMUSCULAR | Status: AC
  Administered 2020-08-28: 07:00:00 100 ug via INTRAVENOUS

## 2020-08-28 MED ORDER — ONDANSETRON HCL 4 MG/2ML IJ SOLN
INTRAMUSCULAR | Status: AC
  Filled 2020-08-28: qty 2

## 2020-08-28 MED ORDER — EPINEPHRINE 1 MG/10ML IJ SOSY
PREFILLED_SYRINGE | INTRAMUSCULAR | Status: DC | PRN
  Administered 2020-08-28: 1 mg

## 2020-08-28 MED ORDER — OXYCODONE HCL 5 MG PO TABS: 5.0000 mg | ORAL_TABLET | Freq: Once | ORAL | Status: DC | PRN

## 2020-08-28 MED ORDER — CEFAZOLIN SODIUM-DEXTROSE 2-4 GM/100ML-% IV SOLN
INTRAVENOUS | Status: AC
  Filled 2020-08-28: qty 100

## 2020-08-28 MED ORDER — DEXAMETHASONE SODIUM PHOSPHATE 10 MG/ML IJ SOLN
INTRAMUSCULAR | Status: DC | PRN
  Administered 2020-08-28: 10 mg via INTRAVENOUS

## 2020-08-28 MED ORDER — ONDANSETRON HCL 4 MG/2ML IJ SOLN: 4.0000 mg | Freq: Once | INTRAMUSCULAR | Status: DC | PRN

## 2020-08-28 MED ORDER — LIDOCAINE 2% (20 MG/ML) 5 ML SYRINGE
INTRAMUSCULAR | Status: DC | PRN
  Administered 2020-08-28: 100 mg via INTRAVENOUS

## 2020-08-28 MED ORDER — MENTHOL 3 MG MT LOZG
LOZENGE | OROMUCOSAL | Status: AC
  Filled 2020-08-28: qty 9

## 2020-08-28 MED ORDER — PROPOFOL 10 MG/ML IV BOLUS
INTRAVENOUS | Status: AC
  Filled 2020-08-28: qty 20

## 2020-08-28 MED ORDER — BUPIVACAINE HCL 0.5 % IJ SOLN
INTRAMUSCULAR | Status: DC | PRN
  Administered 2020-08-28: 15 mL

## 2020-08-28 MED ORDER — LACTATED RINGERS IV SOLN: INTRAVENOUS | Status: DC

## 2020-08-28 MED ORDER — MIDAZOLAM HCL 2 MG/2ML IJ SOLN
2.0000 mg | Freq: Once | INTRAMUSCULAR | Status: AC
  Administered 2020-08-28: 07:00:00 2 mg via INTRAVENOUS

## 2020-08-28 MED ORDER — ONDANSETRON HCL 4 MG/2ML IJ SOLN
INTRAMUSCULAR | Status: DC | PRN
  Administered 2020-08-28: 4 mg via INTRAVENOUS

## 2020-08-28 SURGICAL SUPPLY — 83 items
APL PRP STRL LF DISP 70% ISPRP (MISCELLANEOUS) ×1
BLADE SURG 11 STRL SS (BLADE) ×2 IMPLANT
BLADE SURG 15 STRL LF DISP TIS (BLADE) IMPLANT
BLADE SURG 15 STRL SS (BLADE)
BURR OVAL 8 FLU 4.0X13 (MISCELLANEOUS) ×2 IMPLANT
CANNULA 5.75X7 CRYSTAL CLEAR (CANNULA) ×1 IMPLANT
CANNULA 5.75X71 LONG (CANNULA) IMPLANT
CANNULA TWIST IN 8.25X7CM (CANNULA) IMPLANT
CHLORAPREP W/TINT 26 (MISCELLANEOUS) ×1 IMPLANT
CONNECTOR 5 IN 1 STRAIGHT STRL (MISCELLANEOUS) ×2 IMPLANT
COVER WAND RF STERILE (DRAPES) ×2 IMPLANT
DISSECTOR  3.8MM X 13CM (MISCELLANEOUS) ×2
DISSECTOR 3.8MM X 13CM (MISCELLANEOUS) ×1 IMPLANT
DRAPE IMP U-DRAPE 54X76 (DRAPES) ×1 IMPLANT
DRAPE ORTHO SPLIT 77X108 STRL (DRAPES) ×4
DRAPE POUCH INSTRU U-SHP 10X18 (DRAPES) ×2 IMPLANT
DRAPE SHEET LG 3/4 BI-LAMINATE (DRAPES) ×2 IMPLANT
DRAPE STERI 35X30 U-POUCH (DRAPES) ×2 IMPLANT
DRAPE SURG 17X23 STRL (DRAPES) ×2 IMPLANT
DRAPE SURG ORHT 6 SPLT 77X108 (DRAPES) ×2 IMPLANT
DRAPE U-SHAPE 47X51 STRL (DRAPES) ×2 IMPLANT
DRSG EMULSION OIL 3X3 NADH (GAUZE/BANDAGES/DRESSINGS) ×2 IMPLANT
DRSG PAD ABDOMINAL 8X10 ST (GAUZE/BANDAGES/DRESSINGS) ×2 IMPLANT
DURAPREP 26ML APPLICATOR (WOUND CARE) ×1 IMPLANT
ELECT REM PT RETURN 9FT ADLT (ELECTROSURGICAL)
ELECTRODE REM PT RTRN 9FT ADLT (ELECTROSURGICAL) IMPLANT
FIBERSTICK 2 (SUTURE) IMPLANT
GAUZE SPONGE 4X4 12PLY STRL (GAUZE/BANDAGES/DRESSINGS) ×2 IMPLANT
GLOVE BIO SURGEON STRL SZ7.5 (GLOVE) ×4 IMPLANT
GLOVE BIOGEL PI IND STRL 7.0 (GLOVE) IMPLANT
GLOVE BIOGEL PI INDICATOR 7.0 (GLOVE) ×2
GLOVE INDICATOR 8.0 STRL GRN (GLOVE) ×4 IMPLANT
GOWN STRL REUS W/TWL LRG LVL3 (GOWN DISPOSABLE) ×6 IMPLANT
IV NS IRRIG 3000ML ARTHROMATIC (IV SOLUTION) ×4 IMPLANT
KIT TURNOVER CYSTO (KITS) ×2 IMPLANT
LASSO SUT 90 DEGREE (SUTURE) IMPLANT
LOOP 2 FIBERLINK CLOSED (SUTURE) IMPLANT
MANIFOLD NEPTUNE II (INSTRUMENTS) ×2 IMPLANT
NDL FILTER BLUNT 18X1 1/2 (NEEDLE) ×1 IMPLANT
NDL MAYO 6 CRC TAPER PT (NEEDLE) IMPLANT
NDL MAYO CATGUT SZ4 TPR NDL (NEEDLE) IMPLANT
NDL SAFETY ECLIPSE 18X1.5 (NEEDLE) IMPLANT
NDL SCORPION MULTI FIRE (NEEDLE) IMPLANT
NEEDLE FILTER BLUNT 18X 1/2SAF (NEEDLE) ×1
NEEDLE FILTER BLUNT 18X1 1/2 (NEEDLE) ×1 IMPLANT
NEEDLE HYPO 18GX1.5 SHARP (NEEDLE)
NEEDLE MAYO 6 CRC TAPER PT (NEEDLE) IMPLANT
NEEDLE MAYO CATGUT SZ4 (NEEDLE) IMPLANT
NEEDLE SCORPION MULTI FIRE (NEEDLE) IMPLANT
NS IRRIG 500ML POUR BTL (IV SOLUTION) ×2 IMPLANT
PACK ARTHROSCOPY DSU (CUSTOM PROCEDURE TRAY) ×2 IMPLANT
PACK BASIN DAY SURGERY FS (CUSTOM PROCEDURE TRAY) ×2 IMPLANT
PAD ARMBOARD 7.5X6 YLW CONV (MISCELLANEOUS) IMPLANT
PENCIL SMOKE EVACUATOR (MISCELLANEOUS) IMPLANT
PROBE APOLLO 90XL (SURGICAL WAND) ×2 IMPLANT
SLEEVE ARM SUSPENSION SYSTEM (MISCELLANEOUS) ×2 IMPLANT
SLING ARM FOAM STRAP SML (SOFTGOODS) ×1 IMPLANT
SLING S3 LATERAL DISP (MISCELLANEOUS) ×2 IMPLANT
SLING ULTRA II AB L (ORTHOPEDIC SUPPLIES) IMPLANT
SPONGE LAP 4X18 RFD (DISPOSABLE) IMPLANT
STRIP CLOSURE SKIN 1/2X4 (GAUZE/BANDAGES/DRESSINGS) ×1 IMPLANT
SUCTION FRAZIER HANDLE 10FR (MISCELLANEOUS)
SUCTION TUBE FRAZIER 10FR DISP (MISCELLANEOUS) IMPLANT
SUT 2 FIBERLOOP 20 STRT BLUE (SUTURE)
SUT ETHILON 3 0 PS 1 (SUTURE) IMPLANT
SUT FIBERWIRE #2 38 T-5 BLUE (SUTURE)
SUT LASSO 45 DEGREE LEFT (SUTURE) IMPLANT
SUT LASSO 45D RIGHT (SUTURE) IMPLANT
SUT MNCRL AB 3-0 PS2 27 (SUTURE) ×2 IMPLANT
SUT PDS AB 0 CT1 36 (SUTURE) IMPLANT
SUT TIGER TAPE 7 IN WHITE (SUTURE) IMPLANT
SUT VIC AB 0 CT1 36 (SUTURE) IMPLANT
SUT VIC AB 2-0 CT1 27 (SUTURE)
SUT VIC AB 2-0 CT1 TAPERPNT 27 (SUTURE) IMPLANT
SUTURE 2 FIBERLOOP 20 STRT BLU (SUTURE) IMPLANT
SUTURE FIBERWR #2 38 T-5 BLUE (SUTURE) IMPLANT
SYR CONTROL 10ML LL (SYRINGE) IMPLANT
SYR TB 1ML LL NO SAFETY (SYRINGE) IMPLANT
TAPE CLOTH SURG 6X10 WHT LF (GAUZE/BANDAGES/DRESSINGS) ×2 IMPLANT
TOWEL OR 17X26 10 PK STRL BLUE (TOWEL DISPOSABLE) ×2 IMPLANT
TUBE CONNECTING 12X1/4 (SUCTIONS) ×5 IMPLANT
TUBING ARTHROSCOPY IRRIG 16FT (MISCELLANEOUS) ×2 IMPLANT
YANKAUER SUCT BULB TIP NO VENT (SUCTIONS) IMPLANT

## 2020-08-28 NOTE — Anesthesia Postprocedure Evaluation (Signed)
Anesthesia Post Note  Patient: Lori King  Procedure(s) Performed: ARTHROSCOPY SHOULDER lysis of adhesions and manipulation under anesthesia, extensive debridement (Right Shoulder)     Patient location during evaluation: PACU Anesthesia Type: General Level of consciousness: awake and alert Pain management: pain level controlled Vital Signs Assessment: post-procedure vital signs reviewed and stable Respiratory status: spontaneous breathing, nonlabored ventilation, respiratory function stable and patient connected to nasal cannula oxygen Cardiovascular status: blood pressure returned to baseline and stable Postop Assessment: no apparent nausea or vomiting Anesthetic complications: no   No complications documented.  Last Vitals:  Vitals:   08/28/20 0930 08/28/20 1008  BP: 133/82 119/72  Pulse:  (!) 56  Resp:  16  Temp:  36.6 C  SpO2: 97% 100%    Last Pain:  Vitals:   08/28/20 1008  TempSrc: Oral  PainSc: 0-No pain                 Fischer Halley

## 2020-08-28 NOTE — Transfer of Care (Signed)
Immediate Anesthesia Transfer of Care Note  Patient: Lori King  Procedure(s) Performed: ARTHROSCOPY SHOULDER lysis of adhesions and manipulation under anesthesia, extensive debridement (Right Shoulder)  Patient Location: PACU  Anesthesia Type:GA combined with regional for post-op pain  Level of Consciousness: awake, alert , oriented and patient cooperative  Airway & Oxygen Therapy: Patient Spontanous Breathing and Patient connected to nasal cannula oxygen  Post-op Assessment: Report given to RN, Post -op Vital signs reviewed and stable and Patient moving all extremities  Post vital signs: Reviewed and stable  Last Vitals:  Vitals Value Taken Time  BP 128/96 08/28/20 0845  Temp    Pulse 78 08/28/20 0845  Resp 14 08/28/20 0845  SpO2 99 % 08/28/20 0845  Vitals shown include unvalidated device data.  Last Pain:  Vitals:   08/28/20 0600  TempSrc: Oral  PainSc: 2       Patients Stated Pain Goal: 5 (08/28/20 0600)  Complications: No complications documented.

## 2020-08-28 NOTE — Anesthesia Procedure Notes (Addendum)
Anesthesia Regional Block: Interscalene brachial plexus block   Pre-Anesthetic Checklist: ,, timeout performed, Correct Patient, Correct Site, Correct Laterality, Correct Procedure, Correct Position, site marked, Risks and benefits discussed,  Surgical consent,  Pre-op evaluation,  At surgeon's request and post-op pain management  Laterality: Right  Prep: chloraprep       Needles:  Injection technique: Single-shot  Needle Type: Echogenic Stimulator Needle     Needle Length: 5cm  Needle Gauge: 22     Additional Needles:   Procedures:, nerve stimulator,,, ultrasound used (permanent image in chart),,,,   Nerve Stimulator or Paresthesia:  Response: quadraceps contraction, 0.45 mA,   Additional Responses:   Narrative:  Start time: 08/28/2020 7:00 AM End time: 08/28/2020 7:05 AM Injection made incrementally with aspirations every 5 mL.  Performed by: Personally  Anesthesiologist: Bethena Midget, MD  Additional Notes: Functioning IV was confirmed and monitors were applied.  A 14mm 22ga Arrow echogenic stimulator needle was used. Sterile prep and drape,hand hygiene and sterile gloves were used. Ultrasound guidance: relevant anatomy identified, needle position confirmed, local anesthetic spread visualized around nerve(s)., vascular puncture avoided.  Image printed for medical record. Negative aspiration and negative test dose prior to incremental administration of local anesthetic. The patient tolerated the procedure well.

## 2020-08-28 NOTE — Discharge Instructions (Signed)
-Maintain your right upper extremity in the sling for comfort only.  You may remove the arm and begin using it as often and frequently as possible once the nerve block wears off.  -Maintain your postoperative bandages for 3 days.  You may remove these bandages on Thursday and begin showering at that time.  You should cover your incision sites with Band-Aids between showers.  -Apply ice to the right shoulder liberally throughout the day.  You should leave it on the arm for 30 minutes at a time.  You should try to do this as often as possible throughout the day.  -For mild to moderate pain use Tylenol and Advil around-the-clock in alternating.  For breakthrough pain use oxycodone as necessary.  -Return to see Dr. Stann Mainland in 2 weeks for routine postoperative check.   Shoulder Arthroscopy, Care After This sheet gives you information about how to care for yourself after your procedure. Your health care provider may also give you more specific instructions. If you have problems or questions, contact your health care provider. What can I expect after the procedure? After the procedure, it is common to have:  Pain that can be relieved by taking pain medicine.  Swelling.  A small amount of fluid from the incision.  Stiffness that improves over time. Follow these instructions at home: If you have a sling or immobilizer:  Wear the sling or immobilizer as told by your health care provider. Remove it only as told by your health care provider. These devices protect your shoulder and help it heal by keeping it in place.  Loosen the sling or immobilizer if your fingers tingle, become numb, or turn cold and blue.  Keep the sling or immobilizer clean.  Ask if you may remove the sling or immobilizer for bathing. If you need to keep it on while bathing and it is not waterproof: ? Do not let it get wet. ? Cover it with a watertight covering when you take a bath or a shower. Incision care   Follow  instructions from your health care provider about how to take care of your incisions. Make sure you: ? Wash your hands with soap and water before you change your bandage (dressing). If soap and water are not available, use hand sanitizer. ? Change your dressing as told by your health care provider. ? Leave stitches (sutures), staples, skin glue, or adhesive strips in place. These skin closures may need to stay in place for 2 weeks or longer. If adhesive strip edges start to loosen and curl up, you may trim the loose edges. Do not remove adhesive strips completely unless your health care provider tells you to do that.  Check your incision areas every day for signs of infection. Check for: ? Redness ? More swelling or pain. ? Blood or more fluid. ? Warmth. ? Pus or a bad smell. Bathing  Do not take baths, swim, or use a hot tub until your health care provider approves. Ask your health care provider if you may take showers. You may only be allowed to take sponge baths. Activity  Ask your health care provider what activities are safe for you during recovery, and ask what activities you need to avoid.  Do not lift with your affected shoulder until your health care provider approves.  Avoid pulling and pushing with the arm on your affected side.  If physical therapy was prescribed, do exercises as directed. Doing exercises may help to improve shoulder movement and flexibility (range  of motion). Driving  Do not drive until your health care provider approves.  Do not drive or use heavy machinery while taking prescription pain medicine. Managing pain, stiffness, and swelling   If lying down flat causes shoulder discomfort, it may help to sleep in a sitting position for a few days after your procedure. Try sleeping in a reclining chair or propping yourself up with extra pillows in bed.  If directed, put ice on the affected area: ? Put ice in a plastic bag or use the icing device (cold therapy  unit) that you were given. Follow instructions from your health care provider about how to use the icing device. ? Place a towel between your skin and the bag or between your skin and the icing device. ? Leave the ice on for 20 minutes, 2-3 times a day.  Move your fingers often to avoid stiffness and to lessen swelling. General instructions  Take over-the-counter and prescription medicines only as told by your health care provider.  If you are taking prescription pain medicine, take actions to prevent or treat constipation. Your health care provider may recommend that you: ? Drink enough fluid to keep your urine pale yellow. ? Eat foods that are high in fiber, such as fresh fruits and vegetables, whole grains, and beans. ? Limit foods that are high in fat and processed sugars, such as fried or sweet foods. ? Take an over-the-counter or prescription medicine for constipation.  Do not use any products that contain nicotine or tobacco, such as cigarettes and e-cigarettes. These can delay incision or bone healing. If you need help quitting, ask your health care provider.  Keep all follow-up visits as told by your health care provider. This is important. Contact a health care provider if you:  Have a fever.  Have severe pain.  Have redness around an incision.  Have more swelling or pain in an incision area.  Have blood or more fluid coming from an incision.  Notice that an incision feels warm to the touch.  Notice pus or a bad smell coming from an incision.  Notice that an incision has opened up.  Develop a rash. Get help right away if you:  Have difficulty breathing.  Have chest pain.  Notice that your fingers tingle, are numb, or are cold and blue even after you loosen your sling or immobilizer.  Develop pain in your lower leg or at the back of your knee. Summary  If you have a sling or immobilizer, wear it as told by your health care provider. These devices protect your  shoulder and help it heal by keeping it in place.  If lying down flat causes shoulder discomfort, it may help to sleep in a sitting position for a few days after your procedure. Try sleeping in a reclining chair, or try propping yourself up with extra pillows in bed.  If physical therapy was prescribed, do exercises as directed. Doing exercises may help to improve shoulder movement and flexibility (range of motion).  Keep all follow-up visits as told by your health care provider. This is important. This information is not intended to replace advice given to you by your health care provider. Make sure you discuss any questions you have with your health care provider. Document Revised: 07/29/2017 Document Reviewed: 07/01/2017 Elsevier Patient Education  [place] Instructions  Activity: Get plenty of rest for the remainder of the day. A responsible individual must stay with you for  24 hours following the procedure.  For the next 24 hours, DO NOT: -Drive a car -Advertising copywriter -Drink alcoholic beverages -Take any medication unless instructed by your physician -Make any legal decisions or sign important papers.  Meals: Start with liquid foods such as gelatin or soup. Progress to regular foods as tolerated. Avoid greasy, spicy, heavy foods. If nausea and/or vomiting occur, drink only clear liquids until the nausea and/or vomiting subsides. Call your physician if vomiting continues.  Special Instructions/Symptoms: Your throat may feel dry or sore from the anesthesia or the breathing tube placed in your throat during surgery. If this causes discomfort, gargle with warm salt water. The discomfort should disappear within 24 hours.  Information for Discharge Teaching: EXPAREL (bupivacaine liposome injectable suspension)   Your surgeon or anesthesiologist gave you EXPAREL(bupivacaine) to help control your pain after surgery.   EXPAREL is a local  anesthetic that provides pain relief by numbing the tissue around the surgical site.  EXPAREL is designed to release pain medication over time and can control pain for up to 72 hours.  Depending on how you respond to EXPAREL, you may require less pain medication during your recovery.  Possible side effects:  Temporary loss of sensation or ability to move in the area where bupivacaine was injected.  Nausea, vomiting, constipation  Rarely, numbness and tingling in your mouth or lips, lightheadedness, or anxiety may occur.  Call your doctor right away if you think you may be experiencing any of these sensations, or if you have other questions regarding possible side effects.  Follow all other discharge instructions given to you by your surgeon or nurse. Eat a healthy diet and drink plenty of water or other fluids.  If you return to the hospital for any reason within 96 hours following the administration of EXPAREL, it is important for health care providers to know that you have received this anesthetic. A teal colored band has been placed on your arm with the date, time and amount of EXPAREL you have received in order to alert and inform your health care providers. Please leave this armband in place for the full 96 hours following administration, and then you may remove the band (Monday, January 3rd).   Regional Anesthesia Blocks  1. Numbness or the inability to move the "blocked" extremity may last from 3-48 hours after placement. The length of time depends on the medication injected and your individual response to the medication. If the numbness is not going away after 48 hours, call your surgeon.  2. The extremity that is blocked will need to be protected until the numbness is gone and the  Strength has returned. Because you cannot feel it, you will need to take extra care to avoid injury. Because it may be weak, you may have difficulty moving it or using it. You may not know what position it  is in without looking at it while the block is in effect.  3. For blocks in the legs and feet, returning to weight bearing and walking needs to be done carefully. You will need to wait until the numbness is entirely gone and the strength has returned. You should be able to move your leg and foot normally before you try and bear weight or walk. You will need someone to be with you when you first try to ensure you do not fall and possibly risk injury.  4. Bruising and tenderness at the needle site are common side effects and will resolve in a few  days.  5. Persistent numbness or new problems with movement should be communicated to the surgeon.

## 2020-08-28 NOTE — Brief Op Note (Signed)
08/28/2020  8:33 AM  PATIENT:  Lori King  54 y.o. female  PRE-OPERATIVE DIAGNOSIS:  right shoulder adhesive capsulitis  POST-OPERATIVE DIAGNOSIS:  right shoulder adhesive capsulitis  PROCEDURE:  Procedure(s): ARTHROSCOPY SHOULDER lysis of adhesions and manipulation under anesthesia, extensive debridement (Right)  SURGEON:  Surgeon(s) and Role:    * Yolonda Kida, MD - Primary  PHYSICIAN ASSISTANT:  Dion Saucier, PA-C  ANESTHESIA:   regional and general  EBL:  10 mL   BLOOD ADMINISTERED:none  DRAINS: none   LOCAL MEDICATIONS USED:  NONE  SPECIMEN:  No Specimen  DISPOSITION OF SPECIMEN:  N/A  COUNTS:  YES  TOURNIQUET:  * No tourniquets in log *  DICTATION: .Note written in EPIC  PLAN OF CARE: Discharge to home after PACU  PATIENT DISPOSITION:  PACU - hemodynamically stable.   Delay start of Pharmacological VTE agent (>24hrs) due to surgical blood loss or risk of bleeding: not applicable

## 2020-08-28 NOTE — Op Note (Signed)
08/28/2020   PATIENT:  Lori King    PRE-OPERATIVE DIAGNOSIS:  right shoulder adhesive capsulitis  POST-OPERATIVE DIAGNOSIS:  Same  PROCEDURE:  1.  ARTHROSCOPY SHOULDER right shoulder with capsular release 2.  Arthroscopic extensive debridement of left shoulder including anterior labrum, superior labrum, biceps tendon with biceps tenotomy, and subacromial bursectomy 3.  Left shoulder manipulation under anesthesia,    SURGEON:  Yolonda Kida, MD  PHYSICIAN ASSISTANT:  Dion Saucier, PA-C  Assistant attestation: PA Mcclung was utilized throughout the procedure for positioning the patient, manipulation of shoulder, instrumentation of the shoulder and closure of wounds as well as placement of sling.  ANESTHESIA:   General  ESTIMATED BLOOD LOSS: 5 cc  PREOPERATIVE INDICATIONS:  Lori King is a  54 y.o. female with a diagnosis of right shoulder adhesive capsulitis who failed conservative measures and elected for surgical management.    The risks benefits and alternatives were discussed with the patient preoperatively including but not limited to the risks of infection, bleeding, nerve injury, cardiopulmonary complications, the need for revision surgery, among others, and the patient was willing to proceed.   OPERATIVE FINDINGS:  Significant adhesive capsulitis was noted when she was asleep.  She had very minimal range of motion with passive elevation up to about 100 degrees and external rotation to 20 with internal rotation of 30.  Abduction to 90.  On diagnostic arthroscopy she was noted to have intact rotator cuff muscles x4.  Quite thickened and erythematous capsule noted mostly in the inferior and posterior aspect.  Thickening and inflammation noted of the proximal biceps tendon.  She had degenerative tearing noted of the superior and inferior as well as anterior labrum.  In the subacromial space she had abundant subacromial bursitis but no signs of rotator cuff  pathology on the bursal side.   OPERATIVE PROCEDURE: The patient was brought to the operating room and placed in the supine position. General anesthesia was administered. IV antibiotics were given. General anesthesia was administered.   The upper extremity was examined and found to be grossly unstable particularly to anterior testing. The upper extremity was prepped and draped in the usual sterile fashion. The patient was in a semilateral decubitus position.  Time out was performed. Diagnostic arthroscopy was carried out the above-named findings.   We began the procedure with the diagnostic arthroscopy.  Posterior viewing portal was established 2 cm distal and 1 cm medial to the posterior lateral corner of the acromion.  Upon entering the joint we did notice that the glenohumeral joint itself was quite tight due to the adhesive capsulitis.  We then under direct visualization established a mid glenoid working portal.  We began at that juncture with a diagnostic arthroscopy.  Findings as noted above.  We then performed a biceps tenotomy with radiofrequency wand.  The biceps tendon was noted to be erythematous and flattened.  We did note that all 4 rotator cuffs were intact.  There was also degenerative tearing noted of the anterior, superior, and posterior labrum.  At this juncture we performed arthroscopic extensive debridement with motorized shaver and radiofrequency wand.  We debrided the middle glenohumeral ligament, rotator interval, superior labral, anterior labrum, and posterior labrum with motorized shaver.  Next, we performed arthroscopic 360 degree capsular release.  We did this anteriorly from the 12 o'clock position to 6 o'clock position in this right shoulder with the use of radiofrequency wand.  We established a plane between the rotator cuff musculature and capsule.  We  then utilized the wand to release from 12-6 o'clock.  We then flipped our orientation with the camera in the anterior portal  and shaver in the posterior lateral portal.  We then establish the same plane from 12:00 to 6:00 and moving posteriorly between the rotator cuff musculature and capsule.  We then used the basket biter to release from 12:00 to 6:00 posteriorly.  This completed 360 degree release.  We next, entered the subacromial space.  We established a lateral working portal at the midportion of the [facility] joint 4 cm lateral to the lateral edge of the acromion.  This was done under direct spinal needle localization.  We then performed extensive bursectomy after abundant bursa was noted.  There were no obvious adhesions in the subacromial space.  The rotator cuff was intact.  Lastly, we performed epilation of the shoulder joint under general anesthesia.  With forward elevation we were able to achieve 160 degrees, external rotation 80 and internal rotation 80.  Abduction of the shoulder 110 degrees.  the arthroscopic cannulas were removed, and the portals closed with Monocryl followed by Steri-Strips and sterile gauze. The patient was awakened and returned to the PACU in stable and satisfactory condition. There were no complications and the patient tolerated the procedure well.  All counts were correct.  Disposition:  The patient will be nonweightbearing with a simple sling.  She can be weightbearing as tolerated and use the right arm immediately following resolution of her nerve block.  No limitations.  We will see her back in the office in 2 weeks.

## 2020-08-28 NOTE — H&P (Signed)
ORTHOPAEDIC H and P  REQUESTING PHYSICIAN: Nicholes Stairs, MD  PCP:  Robyne Peers, MD  Chief Complaint: Right shoulder adhesive capsulitis  HPI: Lori King is a 54 y.o. female who complains of recalcitrant right shoulder pain and stiffness, consistent with adhesive capsulitis.  She is here today after failing conservative treatment for operative intervention.  Past Medical History:  Diagnosis Date  . Adhesive capsulitis of right shoulder   . Allergic rhinitis, seasonal   . Anemia   . Anxiety   . MDD (major depressive disorder)   . Wears contact lenses    Past Surgical History:  Procedure Laterality Date  . BREAST BIOPSY Left 2010   x2 per pt benign  . CESAREAN SECTION  X2  last one 02-28-2001   W/  BILATERAL TUBAL LIGATION w/ last c/s  . COLONOSCOPY  11/01/2017  . HYSTEROSCOPY W/ ENDOMETRIAL ABLATION  2019  . LUMBAR LAMINECTOMY/DECOMPRESSION MICRODISCECTOMY  2013   L4  --- S1   Social History   Socioeconomic History  . Marital status: Married    Spouse name: Not on file  . Number of children: Not on file  . Years of education: Not on file  . Highest education level: Not on file  Occupational History  . Not on file  Tobacco Use  . Smoking status: Never Smoker  . Smokeless tobacco: Never Used  Vaping Use  . Vaping Use: Never used  Substance and Sexual Activity  . Alcohol use: Yes    Comment: OCCASIONAL  . Drug use: Never  . Sexual activity: Not on file  Other Topics Concern  . Not on file  Social History Narrative  . Not on file   Social Determinants of Health   Financial Resource Strain: Not on file  Food Insecurity: Not on file  Transportation Needs: Not on file  Physical Activity: Not on file  Stress: Not on file  Social Connections: Not on file   Family History  Problem Relation Age of Onset  . Breast cancer Mother 44  . Breast cancer Sister 50   Allergies  Allergen Reactions  . Other Anaphylaxis    ALL TYPES OF FISH   . Shellfish Allergy Anaphylaxis    ALL SHELLFISH  . Doxycycline Itching  . Ciprofloxacin Rash  . Penicillins Rash   Prior to Admission medications   Medication Sig Start Date End Date Taking? Authorizing Provider  acetaminophen (TYLENOL) 500 MG tablet Take 500 mg by mouth every 6 (six) hours as needed.   Yes [provider]  buPROPion (WELLBUTRIN XL) 300 MG 24 hr tablet Take 300 mg by mouth daily.   Yes [provider]  fluticasone (FLONASE) 50 MCG/ACT nasal spray Place into both nostrils daily.   Yes [provider]  loratadine (CLARITIN) 10 MG tablet Take 10 mg by mouth daily.   Yes [provider]  LORazepam (ATIVAN) 0.5 MG tablet Take 0.5 mg by mouth every 6 (six) hours as needed for anxiety.   Yes [provider]  EPINEPHrine (EPIPEN 2-PAK) 0.3 mg/0.3 mL IJ SOAJ injection Inject 0.3 mLs (0.3 mg total) into the muscle once as needed (for severe allergic reaction). CAll 911 immediately if you have to use this medicine 03/20/17   Larene Pickett, PA-C   No results found.  Positive ROS: All other systems have been reviewed and were otherwise negative with the exception of those mentioned in the HPI and as above.  Physical Exam: General: Alert, no acute distress  Cardiovascular: No pedal edema Respiratory: No cyanosis, no use of accessory musculature GI: No organomegaly, abdomen is soft and non-tender Skin: No lesions in the area of chief complaint Neurologic: Sensation intact distally Psychiatric: Patient is competent for consent with normal mood and affect Lymphatic: No axillary or cervical lymphadenopathy  MUSCULOSKELETAL:  Right upper extremity is clean, dry, and intact and is neurovascularly intact.  No open wounds or lesions.  Assessment: Right shoulder adhesive capsulitis  Plan: -Plan will be for arthroscopic lysis of adhesion with biceps release and capsular release.  We will do this in the subacromial space as well.  We will  then manipulate the shoulder under general anesthesia.  We again reviewed the risk and benefit of this including but not limited to bleeding, infection, damage to surrounding nerves and vessels, recalcitrant stiffness and pain, need for further surgery, risk of anesthesia.  She has provided informed consent.  -Plan for discharge home postoperatively from PACU.    Yolonda Kida, MD Cell [phone]    08/28/2020 7:27 AM

## 2020-08-28 NOTE — Anesthesia Procedure Notes (Signed)
Procedure Name: LMA Insertion Date/Time: 08/28/2020 7:38 AM Performed by: Lucinda Dell, CRNA Pre-anesthesia Checklist: Patient identified, Emergency Drugs available, Suction available and Patient being monitored Patient Re-evaluated:Patient Re-evaluated prior to induction Oxygen Delivery Method: Circle system utilized Preoxygenation: Pre-oxygenation with 100% oxygen Induction Type: IV induction Ventilation: Mask ventilation without difficulty LMA: LMA inserted LMA Size: 4.0 Number of attempts: 1 Placement Confirmation: positive ETCO2 and breath sounds checked- equal and bilateral Tube secured with: Tape Dental Injury: Teeth and Oropharynx as per pre-operative assessment

## 2020-09-01 ENCOUNTER — Encounter (HOSPITAL_BASED_OUTPATIENT_CLINIC_OR_DEPARTMENT_OTHER): Payer: Self-pay | Admitting: Orthopedic Surgery

## 2020-09-19 ENCOUNTER — Other Ambulatory Visit: Payer: Self-pay | Admitting: Obstetrics and Gynecology

## 2020-09-19 DIAGNOSIS — Z803 Family history of malignant neoplasm of breast: Secondary | ICD-10-CM

## 2020-09-25 ENCOUNTER — Encounter: Payer: Self-pay | Admitting: Physical Therapy

## 2020-09-25 ENCOUNTER — Ambulatory Visit: Payer: BC Managed Care – PPO | Admitting: Physical Therapy

## 2020-09-25 ENCOUNTER — Other Ambulatory Visit: Payer: Self-pay

## 2020-09-25 DIAGNOSIS — R29898 Other symptoms and signs involving the musculoskeletal system: Secondary | ICD-10-CM | POA: Diagnosis not present

## 2020-09-25 DIAGNOSIS — M25521 Pain in right elbow: Secondary | ICD-10-CM | POA: Diagnosis not present

## 2020-09-25 DIAGNOSIS — M25511 Pain in right shoulder: Secondary | ICD-10-CM | POA: Diagnosis not present

## 2020-09-25 DIAGNOSIS — M6281 Muscle weakness (generalized): Secondary | ICD-10-CM

## 2020-09-25 DIAGNOSIS — G8929 Other chronic pain: Secondary | ICD-10-CM

## 2020-09-25 NOTE — Therapy (Signed)
[facility] [place], [place], [address] Phone: [phone]   Fax:  [phone]  Physical Therapy Evaluation  Patient Details  Name: Lori King MRN: [id number] Date of Birth: Dec 31, 1965 Referring Provider (PT): Dr Victorino December   Encounter Date: 09/25/2020   PT End of Session - 09/25/20 0932    Visit Number 1    Number of Visits 12    Date for PT Re-Evaluation 11/06/20    Authorization Type BCBS    PT Start Time [id number]    PT Stop Time 1011    PT Time Calculation (min) 37 min    Activity Tolerance Patient tolerated treatment well;Patient limited by pain    Behavior During Therapy [facility] for tasks assessed/performed           Past Medical History:  Diagnosis Date  . Adhesive capsulitis of right shoulder   . Allergic rhinitis, seasonal   . Anemia   . Anxiety   . MDD (major depressive disorder)   . Wears contact lenses     Past Surgical History:  Procedure Laterality Date  . BREAST BIOPSY Left 2010   x2 per pt benign  . CESAREAN SECTION  X2  last one 02-28-2001   W/  BILATERAL TUBAL LIGATION w/ last c/s  . COLONOSCOPY  11/01/2017  . HYSTEROSCOPY W/ ENDOMETRIAL ABLATION  2019  . LUMBAR LAMINECTOMY/DECOMPRESSION MICRODISCECTOMY  2013   L4  --- S1  . SHOULDER ARTHROSCOPY Right 08/28/2020   Procedure: ARTHROSCOPY SHOULDER lysis of adhesions and manipulation under anesthesia, extensive debridement;  Surgeon: Nicholes Stairs, MD;  Location: [facility];  Service: Orthopedics;  Laterality: Right;    There were no vitals filed for this visit.    Subjective Assessment - 09/25/20 0934    Subjective Pt had elective Rt shoulder manipulation and bursectomy, bicep tendon.  Pt [facility] sessions [facility], they had her lifting weights and she was in a lot of pain after these sessions that lasted multiple days.  She requested to switch her PT to our clinic = closer to home.     Patient Stated Goals be pain free for the first time in a year and a half    Currently in Pain? Yes    Pain Score 3    goes up to 6/10 pain overnight while trying to sleep   Pain Location Shoulder    Pain Orientation Right   moves around Rt shoulder and down to elbow   Pain Descriptors / Indicators Aching;Dull;Sore;Sharp    Pain Type Surgical pain    Pain Onset More than a month ago    Aggravating Factors  using the arm and at sleep    Pain Relieving Factors ice              [facility] PT Assessment - 09/25/20 0001      Assessment   Medical Diagnosis Rt shoulder manipulation, debridement and bicep tendon debridement    Referring Provider (PT) Dr Victorino December    Onset Date/Surgical Date 08/28/20    Hand Dominance Right    Next MD Visit 10/14/2020    Prior Therapy yes - prior to surgery and for two weeks after surgery at another clinic      Precautions   Precautions None      Balance Screen   Has the patient fallen in the past 6 months No    Has the patient had a decrease in activity level  because of a fear of falling?  No    Is the patient reluctant to leave their home because of a fear of falling?  No      Home Environment   Living Environment Private residence      Observation/Other Assessments   Focus on Therapeutic Outcomes (FOTO)  61%      Posture/Postural Control   Posture/Postural Control --    Posture Comments reduced curvature all aspects of spine, considerable muslce tension globally       AROM   Overall AROM Comments cervical rotation Rt 74, Lt 75    Right Shoulder Extension 46 Degrees    Right Shoulder Flexion 151 Degrees   tight through lat   Right Shoulder ABduction 158 Degrees    Right Shoulder Internal Rotation 62 Degrees    Right Shoulder External Rotation 85 Degrees      Strength   Overall Strength Comments mid trap 3+/5    Right Shoulder Flexion 4+/5    Right Shoulder Extension 4+/5    Right Shoulder ABduction 4-/5    Right Shoulder Internal  Rotation 5/5    Right Shoulder External Rotation 4+/5    Right/Left Elbow --   5/5                     Objective measurements completed on examination: See above findings.       [facility] PT Treatment/Exercise - 09/25/20 0001      Self-Care   Self-Care Other Self-Care Comments    Other Self-Care Comments  reviewed  pts HEP from previous clinic to reduce from 4x/day to 2x/day and then add in new ex from today      Exercises   Exercises Shoulder      Shoulder Exercises: Supine   Other Supine Exercises 30 reps bilat protraction with 1# adding in scapular retraction      Shoulder Exercises: Prone   Retraction Strengthening;Both;20 reps    Retraction Limitations T's with palms down then thumbs up      Shoulder Exercises: Sidelying   External Rotation Strengthening;Right;20 reps;Weights    External Rotation Weight (lbs) 1    External Rotation Limitations towel under elbow                    PT Short Term Goals - 09/25/20 1147      PT SHORT TERM GOAL #1   Title ------      PT SHORT TERM GOAL #2   Title ----      PT SHORT TERM GOAL #3   Title ------      PT SHORT TERM GOAL #4   Title ------             PT Long Term Goals - 09/25/20 1148      PT LONG TERM GOAL #1   Title I with advanced HEP    Time 6    Period Weeks    Status New    Target Date 11/06/20      PT LONG TERM GOAL #2   Title improve muscle strength of mid back and Rt shoulder abduction =/> 4+/5 to assist with return to normal activities    Time 6    Period Weeks    Status New    Target Date 11/06/20      PT LONG TERM GOAL #3   Title Patient to report R shoulder and elbow pain as being no more than 2/10 at worst  Time 6    Period Weeks    Status New    Target Date 11/06/20      PT LONG TERM GOAL #4   Title Patient to be able to reach overhead with 8-10# with R UE with pain not exceeding 2/10    Time 6    Period Weeks    Status New    Target Date 11/06/20       PT LONG TERM GOAL #5   Title 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  She had two weeks of PT at [facility]'s office, then requested to transfer back here.  Overall her Rt shoulder ROM is much improved from before surgery.  She does still have some limitations and has weakness in her thoracic area and shoulder abduction.  Pain is her biggest concern at this time and it limits her functional activity and her sleep.  She wishes to return to her prior level of activity. PT will help her achieve this.    Personal Factors and Comorbidities Behavior Pattern;Past/Current Experience;Time since onset of injury/illness/exacerbation    Examination-Activity Limitations Bathing;Bed Mobility;Reach Overhead;Caring for Others;Carry;Dressing;Hygiene/Grooming;Lift    Examination-Participation Restrictions Meal Prep;Cleaning;Occupation;Community Activity;Shop;Laundry;Yard Work    Stability/Clinical Decision Making Stable/Uncomplicated    Clinical Decision Making Low    Rehab Potential Good    PT Frequency 2x / week    PT Duration 6 weeks    PT Treatment/Interventions ADLs/Self Care Home Management;Cryotherapy;Electrical Stimulation;Iontophoresis 4mg /ml Dexamethasone;Moist Heat;Ultrasound;Functional mobility training;Therapeutic activities;Therapeutic exercise;Balance training;Neuromuscular re-education;Patient/family education;Orthotic Fit/Training;Manual techniques;Passive range of motion;Dry needling;Vasopneumatic Device;Taping;Joint Manipulations;Spinal Manipulations    PT Next  Visit Plan progress RTC strengthening and scapular strengthening slowly as to not increase her pain    PT Home Exercise Plan Access Code: [email or web address]    Consulted and Agree with Plan of Care Patient           Patient will benefit from skilled therapeutic intervention in order to improve the following deficits and impairments:  Decreased range of motion,Decreased coordination,Increased fascial restricitons,Increased muscle spasms,Impaired UE functional use,Pain,Hypomobility,Impaired flexibility,Improper body mechanics,Decreased mobility,Decreased strength,Postural dysfunction  Visit Diagnosis: Chronic right shoulder pain - Plan: PT plan of care cert/re-cert  Muscle weakness (generalized) - Plan: PT plan of care cert/re-cert  Pain in right elbow - Plan: PT plan of care cert/re-cert  Other symptoms and signs involving the musculoskeletal system - Plan: PT plan of care cert/re-cert     Problem List There are no problems to display for this patient.   Jeral Pinch PT  09/25/2020, 12:41 PM  [facility] [place], [place], [address] Phone: [phone]   Fax:  [phone]  Name: Lori King MRN: [id number] Date of Birth: [birth year]

## 2020-09-25 NOTE — Patient Instructions (Signed)
Access Code: [email or web address] URL: [email or web address] Date: 09/25/2020 Prepared by: Jeral Pinch  Exercises Prone Scapular Retraction Arms at Side - 1 x daily - 2 sets - 10 reps Sidelying Shoulder ER with Towel and Dumbbell - 1 x daily - 2-3 sets - 10 reps Supine Scapular Protraction in Flexion with Dumbbells - 1 x daily - 3 sets - 10 reps

## 2020-09-29 ENCOUNTER — Other Ambulatory Visit: Payer: Self-pay

## 2020-09-29 ENCOUNTER — Ambulatory Visit: Payer: BC Managed Care – PPO | Admitting: Physical Therapy

## 2020-09-29 ENCOUNTER — Encounter: Payer: Self-pay | Admitting: Physical Therapy

## 2020-09-29 DIAGNOSIS — R29898 Other symptoms and signs involving the musculoskeletal system: Secondary | ICD-10-CM

## 2020-09-29 DIAGNOSIS — G8929 Other chronic pain: Secondary | ICD-10-CM

## 2020-09-29 DIAGNOSIS — M25511 Pain in right shoulder: Secondary | ICD-10-CM | POA: Diagnosis not present

## 2020-09-29 DIAGNOSIS — M6281 Muscle weakness (generalized): Secondary | ICD-10-CM

## 2020-09-29 DIAGNOSIS — M25521 Pain in right elbow: Secondary | ICD-10-CM | POA: Diagnosis not present

## 2020-09-29 NOTE — Patient Instructions (Signed)
Access Code: [email or web address] URL: [email or web address] Date: 09/29/2020 Prepared by: Jeral Pinch  Exercises Prone Scapular Retraction Arms at Side - 1 x daily - 2 sets - 10 reps Sidelying Shoulder ER with Towel and Dumbbell - 1 x daily - 2-3 sets - 10 reps Supine Scapular Protraction in Flexion with Dumbbells - 1 x daily - 3 sets - 10 reps Sidelying Thoracic Rotation with Open Book - 1 x daily - 2 sets - 10 reps

## 2020-09-29 NOTE — Therapy (Signed)
[place], [place], [address] Phone: [phone]   Fax:  [phone]  Physical Therapy Treatment  Patient Details  Name: Lori King MRN: [id number] Date of Birth: [birth year] Referring Provider (PT): Dr Victorino December   Encounter Date: 09/29/2020   PT End of Session - 09/29/20 1015    Visit Number 2    Number of Visits 12    Date for PT Re-Evaluation 11/06/20    Authorization Type BCBS    PT Start Time 1015    PT Stop Time 1107    PT Time Calculation (min) 52 min    Activity Tolerance Patient tolerated treatment well;Patient limited by pain    Behavior During Therapy [facility] for tasks assessed/performed           Past Medical History:  Diagnosis Date  . Adhesive capsulitis of right shoulder   . Allergic rhinitis, seasonal   . Anemia   . Anxiety   . MDD (major depressive disorder)   . Wears contact lenses     Past Surgical History:  Procedure Laterality Date  . BREAST BIOPSY Left 2010   x2 per pt benign  . CESAREAN SECTION  X2  last one 02-28-2001   W/  BILATERAL TUBAL LIGATION w/ last c/s  . COLONOSCOPY  11/01/2017  . HYSTEROSCOPY W/ ENDOMETRIAL ABLATION  2019  . LUMBAR LAMINECTOMY/DECOMPRESSION MICRODISCECTOMY  2013   L4  --- S1  . SHOULDER ARTHROSCOPY Right 08/28/2020   Procedure: ARTHROSCOPY SHOULDER lysis of adhesions and manipulation under anesthesia, extensive debridement;  Surgeon: Nicholes Stairs, MD;  Location: [facility];  Service: Orthopedics;  Laterality: Right;    There were no vitals filed for this visit.   Subjective Assessment - 09/29/20 1015    Subjective Pt reports she is now only waking up once a night to ice.  She is frustrated because she cannot get painfree.  Is using the TENS everyday. More sore first thing in AM, takes about 2 hrs to get it under control. Less pinching in the elbow. Having symptoms in forearm and posterior shoulder     Patient Stated Goals be pain free for the first time in a year and a half    Currently in Pain? Yes    Pain Score 4     Pain Location Arm   and scapula   Pain Orientation Right   forearm   Pain Descriptors / Indicators Aching;Pressure   deep   Pain Type Surgical pain    Pain Onset More than a month ago    Pain Frequency Constant    Aggravating Factors  sleep , having pressure on the Rt scapula    Pain Relieving Factors ice              [facility] PT Assessment - 09/29/20 0001      Assessment   Medical Diagnosis Rt shoulder manipulation, debridement and bicep tendon debridement    Referring Provider (PT) Dr Victorino December                         [facility] Adult PT Treatment/Exercise - 09/29/20 0001      Shoulder Exercises: Supine   Flexion Strengthening;Right;Weights   30 sec isometric hold at 90 degrees   Shoulder Flexion Weight (lbs) 2    Flexion Limitations second two reps with mini circles      Shoulder Exercises: Sidelying   External Rotation Strengthening;Right;20 reps;Weights  External Rotation Weight (lbs) 2    External Rotation Limitations towel under elbow    Other Sidelying Exercises empty can Rt UE in small ROM 2x10    Other Sidelying Exercises 20 reps open book      Shoulder Exercises: Standing   Flexion Strengthening;Right;Weights   3x30sec isometric hold of 2# overhead   Shoulder Flexion Weight (lbs) 2    Flexion Limitations VC for form    Other Standing Exercises plank on wall, moving from hands to/from elbows, pressing out with Rt UE      Shoulder Exercises: ROM/Strengthening   UBE (Upper Arm Bike) L1x5' alt FWD/BWD      Moist Heat Therapy   Number Minutes Moist Heat 10 Minutes    Moist Heat Location Cervical   and thoracic     Manual Therapy   Manual Therapy Joint mobilization;Soft tissue mobilization    Manual therapy comments skilled palpation and monitoring of soft tissue during dry needling    Joint Mobilization grade III CPA and  rotational to cervical spine, hypomobile at C2 and C5-7, this improved with manual work    Soft tissue mobilization STM to Rt upper trap and levator.            Trigger Point Dry Needling - 09/29/20 0001    Consent Given? Yes    Education Handout Provided Previously provided    Muscles Treated Head and Neck Upper trapezius;Levator scapulae    Muscles Treated Upper Quadrant Rhomboids    Electrical Stimulation Performed with Dry Needling Yes    Upper Trapezius Response Palpable increased muscle length   Rt with stim   Levator Scapulae Response Palpable increased muscle length   Rt with stim   Rhomboids Response Palpable increased muscle length   Rt at T 4-6                 PT Short Term Goals - 09/25/20 1147      PT SHORT TERM GOAL #1   Title ------      PT SHORT TERM GOAL #2   Title ----      PT SHORT TERM GOAL #3   Title ------      PT SHORT TERM GOAL #4   Title ------             PT Long Term Goals - 09/25/20 1148      PT LONG TERM GOAL #1   Title I with advanced HEP    Time 6    Period Weeks    Status New    Target Date 11/06/20      PT LONG TERM GOAL #2   Title improve muscle strength of mid back and Rt shoulder abduction =/> 4+/5 to assist with return to normal activities    Time 6    Period Weeks    Status New    Target Date 11/06/20      PT LONG TERM GOAL #3   Title Patient to report R shoulder and elbow pain as being no more than 2/10 at worst    Time 6    Period Weeks    Status New    Target Date 11/06/20      PT LONG TERM GOAL #4   Title Patient to be able to reach overhead with 8-10# with R UE with pain not exceeding 2/10    Time 6    Period Weeks    Status New    Target Date 11/06/20  PT LONG TERM GOAL #5   Title 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  Tried to get her to focus on her improvements in ROM and functional use.  She tolerated DN well and reported decreased neck/scapular pain after.  She did well with shoulder stability work today as well.  Will need to be progressed slowly with strengthening for the RTC and scapular stability muscles so she doesn't flare up.  Will also benefit from focusing on function and use as opposed to pain levels.    Rehab Potential Good    PT Frequency 2x / week    PT Duration 6 weeks    PT Treatment/Interventions ADLs/Self Care Home Management;Cryotherapy;Electrical Stimulation;Iontophoresis 4mg /ml Dexamethasone;Moist Heat;Ultrasound;Functional mobility training;Therapeutic activities;Therapeutic exercise;Balance training;Neuromuscular re-education;Patient/family education;Orthotic Fit/Training;Manual techniques;Passive range of motion;Dry needling;Vasopneumatic Device;Taping;Joint Manipulations;Spinal Manipulations    PT Next Visit Plan progress RTC strengthening and scapular strengthening slowly as to not increase her pain    PT Home Exercise Plan Access Code: [email or web address]    Consulted and Agree with Plan of Care Patient           Patient will benefit from skilled therapeutic intervention in order to improve the following deficits and impairments:  Decreased range of motion,Decreased coordination,Increased fascial restricitons,Increased muscle spasms,Impaired UE functional use,Pain,Hypomobility,Impaired flexibility,Improper body mechanics,Decreased mobility,Decreased strength,Postural dysfunction  Visit Diagnosis: Chronic right shoulder pain  Muscle weakness (generalized)  Pain in right elbow  Other symptoms and signs involving the  musculoskeletal system     Problem List There are no problems to display for this patient.   Jeral Pinch P T 09/29/2020, 12:50 PM  [facility] [place], [place], [address] Phone: [phone]   Fax:  [phone]  Name: Lori King MRN: [id number] Date of Birth: 04-03-1966

## 2020-10-02 ENCOUNTER — Ambulatory Visit (INDEPENDENT_AMBULATORY_CARE_PROVIDER_SITE_OTHER): Payer: BC Managed Care – PPO | Admitting: Physical Therapy

## 2020-10-02 ENCOUNTER — Telehealth: Payer: Self-pay | Admitting: Physical Therapy

## 2020-10-02 ENCOUNTER — Other Ambulatory Visit: Payer: Self-pay

## 2020-10-02 DIAGNOSIS — R29898 Other symptoms and signs involving the musculoskeletal system: Secondary | ICD-10-CM

## 2020-10-02 DIAGNOSIS — M25521 Pain in right elbow: Secondary | ICD-10-CM

## 2020-10-02 DIAGNOSIS — M6281 Muscle weakness (generalized): Secondary | ICD-10-CM

## 2020-10-02 DIAGNOSIS — M25511 Pain in right shoulder: Secondary | ICD-10-CM | POA: Diagnosis not present

## 2020-10-02 DIAGNOSIS — G8929 Other chronic pain: Secondary | ICD-10-CM

## 2020-10-02 NOTE — Therapy (Signed)
[place], [place], [address] Phone: [phone]   Fax:  [phone]  Physical Therapy Treatment  Patient Details  Name: Lori King MRN: [id number] Date of Birth: 02-Dec-1965 Referring Provider (PT): Dr Victorino December   Encounter Date: 10/02/2020   PT End of Session - 10/02/20 1152    Visit Number 3    Number of Visits 12    Date for PT Re-Evaluation 11/06/20    Authorization Type BCBS    PT Start Time 1150    PT Stop Time 1240    PT Time Calculation (min) 50 min    Activity Tolerance Patient tolerated treatment well    Behavior During Therapy [facility] for tasks assessed/performed           Past Medical History:  Diagnosis Date  . Adhesive capsulitis of right shoulder   . Allergic rhinitis, seasonal   . Anemia   . Anxiety   . MDD (major depressive disorder)   . Wears contact lenses     Past Surgical History:  Procedure Laterality Date  . BREAST BIOPSY Left 2010   x2 per pt benign  . CESAREAN SECTION  X2  last one 02-28-2001   W/  BILATERAL TUBAL LIGATION w/ last c/s  . COLONOSCOPY  11/01/2017  . HYSTEROSCOPY W/ ENDOMETRIAL ABLATION  2019  . LUMBAR LAMINECTOMY/DECOMPRESSION MICRODISCECTOMY  2013   L4  --- S1  . SHOULDER ARTHROSCOPY Right 08/28/2020   Procedure: ARTHROSCOPY SHOULDER lysis of adhesions and manipulation under anesthesia, extensive debridement;  Surgeon: Nicholes Stairs, MD;  Location: [facility];  Service: Orthopedics;  Laterality: Right;    There were no vitals filed for this visit.   Subjective Assessment - 10/02/20 1153    Subjective Pt had physical Tuesday and was prescribed Gabapentin.  Took medication last night and slept through the night for the first time.    Currently in Pain? Yes    Pain Score 2     Pain Location Shoulder    Pain Orientation Right    Pain Descriptors / Indicators Aching    Aggravating Factors  sleep    Pain  Relieving Factors ice; gabapentin              [facility] PT Assessment - 10/02/20 0001      Assessment   Medical Diagnosis Rt shoulder manipulation, debridement and bicep tendon debridement    Referring Provider (PT) Dr Victorino December    Onset Date/Surgical Date 08/28/20    Hand Dominance Right    Next MD Visit 10/14/2020    Prior Therapy yes - prior to surgery and for two weeks after surgery at another clinic            [facility] Adult PT Treatment/Exercise - 10/02/20 0001      Shoulder Exercises: Standing   Flexion Right;10 reps;Strengthening   to shoulder height shelf, slow eccentric lowering.   Shoulder Flexion Weight (lbs) 1      Shoulder Exercises: Pulleys   Flexion --   10 reps, 10 times   ABduction --   5 reps 10 sec hold     Shoulder Exercises: Stretch   Corner Stretch 2 reps;20 seconds    Wall Stretch - Flexion 2 reps;10 seconds    Other Shoulder Stretches Rt tricep stretch with elbow against door frame x 20 sec x 3.  Rt bicep stretch holding counter x 30 sec x 3    Other Shoulder Stretches  Rt wrist flexor and extensor stretch; reviewed open book and IR stretch. Seated table flexion stretch x 20 sec.  Discussed child pose option.      Manual Therapy   Manual Therapy Soft tissue mobilization;Taping    Soft tissue mobilization IASTM to Rt wrist flexors/extensors, biceps brachii (belly/distal), and brachialis to decrease fascial restrictions and improve mobility. Pt instructed on self application during session.    Kinesiotex [employer] forces logistics/support/administrative officer I strip of reg Rock tape applied to [employer] shoulder incision with 50% stretch to assist with scar management and decrease sensitivity.                   PT Long Term Goals - 09/25/20 1148      PT LONG TERM GOAL #1   Title I with advanced HEP    Time 6    Period Weeks    Status New    Target Date 11/06/20      PT LONG TERM GOAL #2   Title improve muscle strength of mid back and Rt  shoulder abduction =/> 4+/5 to assist with return to normal activities    Time 6    Period Weeks    Status New    Target Date 11/06/20      PT LONG TERM GOAL #3   Title Patient to report R shoulder and elbow pain as being no more than 2/10 at worst    Time 6    Period Weeks    Status New    Target Date 11/06/20      PT LONG TERM GOAL #4   Title Patient to be able to reach overhead with 8-10# with R UE with pain not exceeding 2/10    Time 6    Period Weeks    Status New    Target Date 11/06/20      PT LONG TERM GOAL #5   Title 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  She was able to tolerate all stretches well, without increase in pain and good form.  Palpable fascial tightness in Rt forearm, brachialis, and biceps brachii belly; improved with IASTM to area.  Pt progressing well towards goals.    Rehab Potential Good    PT Frequency 2x / week    PT Duration 6 weeks    PT Treatment/Interventions ADLs/Self Care Home Management;Cryotherapy;Electrical Stimulation;Iontophoresis 4mg /ml Dexamethasone;Moist Heat;Ultrasound;Functional mobility training;Therapeutic activities;Therapeutic exercise;Balance training;Neuromuscular re-education;Patient/family education;Orthotic Fit/Training;Manual techniques;Passive range of motion;Dry needling;Vasopneumatic Device;Taping;Joint Manipulations;Spinal Manipulations    PT Next Visit Plan progress RTC strengthening and scapular strengthening slowly as to not increase her pain    PT Home Exercise Plan Access  Code: [email or web address]    Consulted and Agree with Plan of Care Patient  Patient will benefit from skilled therapeutic intervention in order to improve the following deficits and impairments:  Decreased range of motion,Decreased coordination,Increased fascial restricitons,Increased muscle spasms,Impaired UE functional use,Pain,Hypomobility,Impaired flexibility,Improper body mechanics,Decreased mobility,Decreased strength,Postural dysfunction  Visit Diagnosis: Chronic right shoulder pain  Muscle weakness (generalized)  Pain in right elbow  Other symptoms and signs involving the musculoskeletal system     Problem List There are no problems to display for this patient.  Kerin Perna, PTA 10/02/20 1:16 PM  [address] [facility] [place], [place], [address] Phone: [phone]   Fax:  [phone]  Name: ASHER BABILONIA MRN: [id number] Date of Birth: May 27, 1966

## 2020-10-02 NOTE — Telephone Encounter (Signed)
Patient did not show for physical therapy appt.  Called patient, but no answer.Left her voice message to return call to confirm next appt at [facility], [phone].   Kerin Perna, PTA 10/02/20 9:52 AM

## 2020-10-02 NOTE — Patient Instructions (Signed)
Access Code: [email or web address] URL: [email or web address] Date: 10/02/2020 Prepared by: [place]  Exercises Prone Scapular Retraction Arms at Side - 1 x daily - 2 sets - 10 reps Sidelying Shoulder ER with Towel and Dumbbell - 1 x daily - 2 sets - 10 reps Supine Scapular Protraction in Flexion with Dumbbells - 1 x daily - 2 sets - 10 reps Sidelying Thoracic Rotation with Open Book - 1 x daily - 2 sets - 3-5 reps - 5-10 seconds hold Corner Pec Major Stretch - 2 x daily - 7 x weekly - 1 sets - 2 reps - 30 seconds hold Standing Overhead Triceps Stretch - 1 x daily - 7 x weekly - 1 sets - 2 reps - 20 seconds hold Bicep Stretch at Table - 2 x daily - 7 x weekly - 1 sets - 2 reps - 30 seconds hold Standing Shoulder Internal Rotation Stretch with Towel - 1 x daily - 7 x weekly - 1 sets - 2 reps - 15- 30 seconds hold Child's Pose with Sidebending - 1 x daily - 7 x weekly - 1 sets - 2 reps - 30 seconds hold Wrist Flexor Stretch in Pronation - 1-2 x daily - 7 x weekly - 1 sets - 2 reps - 30 seconds hold Standing Wrist Flexion Stretch - 1-2 x daily - 7 x weekly - 1 sets - 2 reps - 30 seconds hold

## 2020-10-06 ENCOUNTER — Encounter: Payer: BC Managed Care – PPO | Admitting: Physical Therapy

## 2020-10-07 ENCOUNTER — Other Ambulatory Visit: Payer: Self-pay

## 2020-10-07 ENCOUNTER — Ambulatory Visit: Payer: BC Managed Care – PPO | Admitting: Physical Therapy

## 2020-10-07 ENCOUNTER — Encounter: Payer: Self-pay | Admitting: Physical Therapy

## 2020-10-07 DIAGNOSIS — M25521 Pain in right elbow: Secondary | ICD-10-CM

## 2020-10-07 DIAGNOSIS — M25511 Pain in right shoulder: Secondary | ICD-10-CM

## 2020-10-07 DIAGNOSIS — G8929 Other chronic pain: Secondary | ICD-10-CM | POA: Diagnosis not present

## 2020-10-07 DIAGNOSIS — M6281 Muscle weakness (generalized): Secondary | ICD-10-CM | POA: Diagnosis not present

## 2020-10-07 NOTE — Therapy (Signed)
[place], [place], [address] Phone: [phone]   Fax:  [phone]  Physical Therapy Treatment  Patient Details  Name: Lori King MRN: [id number] Date of Birth: 01/24/1966 Referring Provider (PT): Dr Victorino December   Encounter Date: 10/07/2020   PT End of Session - 10/07/20 1152    Visit Number 4    Number of Visits 12    Date for PT Re-Evaluation 11/06/20    Authorization Type BCBS    PT Start Time 1148    PT Stop Time 4536    PT Time Calculation (min) 47 min    Activity Tolerance Patient tolerated treatment well    Behavior During Therapy [facility] for tasks assessed/performed           Past Medical History:  Diagnosis Date  . Adhesive capsulitis of right shoulder   . Allergic rhinitis, seasonal   . Anemia   . Anxiety   . MDD (major depressive disorder)   . Wears contact lenses     Past Surgical History:  Procedure Laterality Date  . BREAST BIOPSY Left 2010   x2 per pt benign  . CESAREAN SECTION  X2  last one 02-28-2001   W/  BILATERAL TUBAL LIGATION w/ last c/s  . COLONOSCOPY  11/01/2017  . HYSTEROSCOPY W/ ENDOMETRIAL ABLATION  2019  . LUMBAR LAMINECTOMY/DECOMPRESSION MICRODISCECTOMY  2013   L4  --- S1  . SHOULDER ARTHROSCOPY Right 08/28/2020   Procedure: ARTHROSCOPY SHOULDER lysis of adhesions and manipulation under anesthesia, extensive debridement;  Surgeon: Nicholes Stairs, MD;  Location: [facility];  Service: Orthopedics;  Laterality: Right;    There were no vitals filed for this visit.   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 Sleeping better with Gabapentin.    Patient Stated Goals be pain free for the first time in a year and a half    Currently in Pain? Yes    Pain Score 2     Pain Location Shoulder    Pain Orientation Right    Pain Descriptors /  Indicators Dull    Pain Radiating Towards into Rt lateral forearm              [facility] PT Assessment - 10/07/20 0001      Assessment   Medical Diagnosis Rt shoulder manipulation, debridement and bicep tendon debridement    Referring Provider (PT) Dr Victorino December    Onset Date/Surgical Date 08/28/20    Hand Dominance Right    Next MD Visit 10/14/2020    Prior Therapy yes - prior to surgery and for two weeks after surgery at another clinic            [facility] Adult PT Treatment/Exercise - 10/07/20 0001      Shoulder Exercises: Seated   Horizontal ABduction Both;10 reps    Theraband Level (Shoulder Horizontal ABduction) Level 2 (Red)      Shoulder Exercises: Standing   ABduction Limitations trial of Rt shoulder reactive isometrics x 5 reps, yellow band    Other Standing Exercises Rt tricep ext with yellow band x 10; then shoulder ext x 10 with yellow band.    Other Standing Exercises Rt shoulder ER with yellow band x 10; then bilat ER with red band x 10 (cues for form. ) Rt wrist flex and ext stretch x 20 sec each direction.  Shoulder Exercises: Pulleys   Flexion --   5 reps, 5-10 sec hold.   ABduction --   5 reps, 5-10 sec hold.     Shoulder Exercises: ROM/Strengthening   UBE (Upper Arm Bike) L3: 30 sec forward, 30 sec backward x 2 reps.  some pain in lateral forearm with backward motion.      Shoulder Exercises: Isometric Strengthening   External Rotation --   reactive isometric, yellow band x 10     Shoulder Exercises: Stretch   Other Shoulder Stretches Rt tricep stretch with elbow against door frame x 20 sec x 2.  Rt bicep stretch holding counter/door frame x 15 sec x 3 reps      Manual Therapy   Manual therapy comments I strip of sensitive skin tape applied to Rt bicep brachii belly to mid brachioradialis with 20% stretch. perpendicular strips applied to lateral epicondyle and just below deltoid tuberosity.    Soft tissue mobilization IASTM to Rt wrist  flexors/extensors, biceps brachii (belly/distal), and brachialis to decrease fascial restrictions and improve mobility.      Kinesiotix   [employer] small I strip of reg Rock tape applied to Rt ant/post shoulder incisions with 50% stretch to assist with scar management and decrease sensitivity.                 PT Long Term Goals - 09/25/20 1148      PT LONG TERM GOAL #1   Title I with advanced HEP    Time 6    Period Weeks    Status New    Target Date 11/06/20      PT LONG TERM GOAL #2   Title improve muscle strength of mid back and Rt shoulder abduction =/> 4+/5 to assist with return to normal activities    Time 6    Period Weeks    Status New    Target Date 11/06/20      PT LONG TERM GOAL #3   Title Patient to report R shoulder and elbow pain as being no more than 2/10 at worst    Time 6    Period Weeks    Status New    Target Date 11/06/20      PT LONG TERM GOAL #4   Title Patient to be able to reach overhead with 8-10# with R UE with pain not exceeding 2/10    Time 6    Period Weeks    Status New    Target Date 11/06/20      PT LONG TERM GOAL #5   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  Continued fascial tightness in Rt forearm and brachialis; improved with IASTM. Trial of sensitive skin ktape to this area to decompress and increase proprioception.  She tolerated light resistance for Rt shoulder  well, with small cues for positioning and form.  Progressing well towards goals.    Rehab Potential Good    PT Frequency 2x / week     PT Duration 6 weeks    PT Treatment/Interventions ADLs/Self Care Home Management;Cryotherapy;Electrical Stimulation;Iontophoresis 4mg /ml Dexamethasone;Moist Heat;Ultrasound;Functional mobility training;Therapeutic activities;Therapeutic exercise;Balance training;Neuromuscular re-education;Patient/family education;Orthotic Fit/Training;Manual techniques;Passive range of motion;Dry needling;Vasopneumatic Device;Taping;Joint Manipulations;Spinal Manipulations    PT Next Visit Plan progress RTC strengthening and scapular strengthening slowly as to not increase her pain.  Assess goals. MD note.    PT Home Exercise Plan Access Code: [email or web address]    Consulted and Agree with Plan of Care Patient           Patient will benefit from skilled therapeutic intervention in order to improve the following deficits and impairments:  Decreased range of motion,Decreased coordination,Increased fascial restricitons,Increased muscle spasms,Impaired UE functional use,Pain,Hypomobility,Impaired flexibility,Improper body mechanics,Decreased mobility,Decreased strength,Postural dysfunction  Visit Diagnosis: Chronic right shoulder pain  Muscle weakness (generalized)  Pain in right elbow     Problem List There are no problems to display for this patient.  Kerin Perna, PTA 10/07/20 1:16 PM  [facility] [place], [place], [address] Phone: [phone]   Fax:  [phone]  Name: Lori King MRN: [id number] Date of Birth: Dec 22, 1965

## 2020-10-09 ENCOUNTER — Other Ambulatory Visit: Payer: Self-pay

## 2020-10-09 ENCOUNTER — Ambulatory Visit (INDEPENDENT_AMBULATORY_CARE_PROVIDER_SITE_OTHER): Payer: BC Managed Care – PPO | Admitting: Physical Therapy

## 2020-10-09 ENCOUNTER — Encounter: Payer: Self-pay | Admitting: Physical Therapy

## 2020-10-09 DIAGNOSIS — M25521 Pain in right elbow: Secondary | ICD-10-CM | POA: Diagnosis not present

## 2020-10-09 DIAGNOSIS — M6281 Muscle weakness (generalized): Secondary | ICD-10-CM

## 2020-10-09 DIAGNOSIS — M25511 Pain in right shoulder: Secondary | ICD-10-CM | POA: Diagnosis not present

## 2020-10-09 DIAGNOSIS — R29898 Other symptoms and signs involving the musculoskeletal system: Secondary | ICD-10-CM | POA: Diagnosis not present

## 2020-10-09 DIAGNOSIS — G8929 Other chronic pain: Secondary | ICD-10-CM

## 2020-10-09 NOTE — Therapy (Signed)
[place], [place], [address] Phone: [phone]   Fax:  [phone]  Physical Therapy Treatment  Patient Details  Name: Lori King MRN: [id number] Date of Birth: 1965/12/15 Referring Provider (PT): Dr Victorino December   Encounter Date: 10/09/2020   PT End of Session - 10/09/20 1018    Visit Number 5    Number of Visits 12    Date for PT Re-Evaluation 11/06/20    PT Start Time 1018    PT Stop Time 1109    PT Time Calculation (min) 51 min    Activity Tolerance Patient tolerated treatment well    Behavior During Therapy [facility] for tasks assessed/performed           Past Medical History:  Diagnosis Date  . Adhesive capsulitis of right shoulder   . Allergic rhinitis, seasonal   . Anemia   . Anxiety   . MDD (major depressive disorder)   . Wears contact lenses     Past Surgical History:  Procedure Laterality Date  . BREAST BIOPSY Left 2010   x2 per pt benign  . CESAREAN SECTION  X2  last one 02-28-2001   W/  BILATERAL TUBAL LIGATION w/ last c/s  . COLONOSCOPY  11/01/2017  . HYSTEROSCOPY W/ ENDOMETRIAL ABLATION  2019  . LUMBAR LAMINECTOMY/DECOMPRESSION MICRODISCECTOMY  2013   L4  --- S1  . SHOULDER ARTHROSCOPY Right 08/28/2020   Procedure: ARTHROSCOPY SHOULDER lysis of adhesions and manipulation under anesthesia, extensive debridement;  Surgeon: Nicholes Stairs, MD;  Location: [facility];  Service: Orthopedics;  Laterality: Right;    There were no vitals filed for this visit.   Subjective Assessment - 10/09/20 1018    Subjective Pt is very pleased, her pain is decreasing and she is not having to take tylenol the last couple of days. Joined the [employer] with her mom and sister, they are going to start swimming.    Patient Stated Goals be pain free for the first time in a year and a half    Currently in Pain? No/denies              [facility] PT Assessment - 10/09/20  0001      Assessment   Medical Diagnosis Rt shoulder manipulation, debridement and bicep tendon debridement    Referring Provider (PT) Dr Victorino December    Next MD Visit 10/14/2020                         [facility] Adult PT Treatment/Exercise - 10/09/20 0001      Shoulder Exercises: Supine   Other Supine Exercises 10 deep neck flexor exercise      Shoulder Exercises: Seated   Other Seated Exercises 4x5 reps , red band, serratus shoulder flexion lifts      Shoulder Exercises: Standing   Row Strengthening;Both;10 reps;Weights   each , arms straight , then pulling to hip narrow and wide   Row Weight (lbs) 3      Shoulder Exercises: ROM/Strengthening   UBE (Upper Arm Bike) L3x4' alt FWD/BWD      Shoulder Exercises: Stretch   Other Shoulder Stretches behind the back stretch with strap at wall, was at L2 intially then up to bra strap at end      Modalities   Modalities Moist Heat      Moist Heat Therapy   Number Minutes Moist Heat 10 Minutes    Moist  Heat Location Shoulder   rt     Manual Therapy   Manual therapy comments i strip of sensitive tape Rt deltoid with perpenducular strip at deloid tuberosity    Joint Mobilization Rt shoulder with passive stretch into ER and IR    Kinesiotex Create Space      Kinesiotix   Create Space small I strip of reg Rock tape applied to Rt ant/post shoulder incisions with 50% stretch to assist with scar management and decrease sensitivity.                    PT Short Term Goals - 09/25/20 1147      PT SHORT TERM GOAL #1   Title ------      PT SHORT TERM GOAL #2   Title ----      PT SHORT TERM GOAL #3   Title ------      PT SHORT TERM GOAL #4   Title ------             PT Long Term Goals - 09/25/20 1148      PT LONG TERM GOAL #1   Title I with advanced HEP    Time 6    Period Weeks    Status New    Target Date 11/06/20      PT LONG TERM GOAL #2   Title improve muscle strength of mid back and Rt  shoulder abduction =/> 4+/5 to assist with return to normal activities    Time 6    Period Weeks    Status New    Target Date 11/06/20      PT LONG TERM GOAL #3   Title Patient to report R shoulder and elbow pain as being no more than 2/10 at worst    Time 6    Period Weeks    Status New    Target Date 11/06/20      PT LONG TERM GOAL #4   Title Patient to be able to reach overhead with 8-10# with R UE with pain not exceeding 2/10    Time 6    Period Weeks    Status New    Target Date 11/06/20      PT LONG TERM GOAL #5   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  She is limited with reaching behind her back - this improved with treatment  today.  She is going to add in her rowing machine at home starting with 2-3 min and increasing as she can tolerate.    Rehab Potential Good    PT Frequency 2x / week    PT Duration 6 weeks    PT Treatment/Interventions ADLs/Self Care Home Management;Cryotherapy;Electrical Stimulation;Iontophoresis 4mg /ml Dexamethasone;Moist Heat;Ultrasound;Functional mobility training;Therapeutic activities;Therapeutic exercise;Balance training;Neuromuscular re-education;Patient/family education;Orthotic  Fit/Training;Manual techniques;Passive range of motion;Dry needling;Vasopneumatic Device;Taping;Joint Manipulations;Spinal Manipulations    PT Next Visit Plan write MD note    PT Home Exercise Plan Access Code: [email or web address]    Consulted and Agree with Plan of Care Patient           Patient will  benefit from skilled therapeutic intervention in order to improve the following deficits and impairments:  Decreased range of motion,Decreased coordination,Increased fascial restricitons,Increased muscle spasms,Impaired UE functional use,Pain,Hypomobility,Impaired flexibility,Improper body mechanics,Decreased mobility,Decreased strength,Postural dysfunction  Visit Diagnosis: Chronic right shoulder pain  Muscle weakness (generalized)  Pain in right elbow  Other symptoms and signs involving the musculoskeletal system     Problem List There are no problems to display for this patient.   Lori King Lori King PT  10/09/2020, 11:19 AM  [facility] [place], [place], [address] Phone: [phone]   Fax:  [phone]  Name: Lori King MRN: [id number] Date of Birth: [birth year]

## 2020-10-13 ENCOUNTER — Encounter: Payer: Self-pay | Admitting: Physical Therapy

## 2020-10-13 ENCOUNTER — Other Ambulatory Visit: Payer: Self-pay

## 2020-10-13 ENCOUNTER — Ambulatory Visit: Payer: BC Managed Care – PPO | Admitting: Physical Therapy

## 2020-10-13 DIAGNOSIS — M25511 Pain in right shoulder: Secondary | ICD-10-CM | POA: Diagnosis not present

## 2020-10-13 DIAGNOSIS — M25521 Pain in right elbow: Secondary | ICD-10-CM | POA: Diagnosis not present

## 2020-10-13 DIAGNOSIS — M6281 Muscle weakness (generalized): Secondary | ICD-10-CM

## 2020-10-13 DIAGNOSIS — R29898 Other symptoms and signs involving the musculoskeletal system: Secondary | ICD-10-CM | POA: Diagnosis not present

## 2020-10-13 DIAGNOSIS — G8929 Other chronic pain: Secondary | ICD-10-CM

## 2020-10-13 NOTE — Therapy (Signed)
Lori King, [place], [place] Phone: [phone]   Fax:  [phone]  Physical Therapy Treatment  Patient Details  Name: Lori King MRN: [id number] Date of Birth: 1966/03/18 Referring Provider (PT): Dr Victorino December   Encounter Date: 10/13/2020   PT End of Session - 10/13/20 1150    Visit Number 6    Number of Visits 12    Date for PT Re-Evaluation 11/06/20    Authorization Type BCBS    PT Start Time 1151    PT Stop Time 1243    PT Time Calculation (min) 52 min    Activity Tolerance Patient tolerated treatment well    Behavior During Therapy [facility] for tasks assessed/performed           Past Medical History:  Diagnosis Date  . Adhesive capsulitis of right shoulder   . Allergic rhinitis, seasonal   . Anemia   . Anxiety   . MDD (major depressive disorder)   . Wears contact lenses     Past Surgical History:  Procedure Laterality Date  . BREAST BIOPSY Left 2010   x2 per pt benign  . CESAREAN SECTION  X2  last one 02-28-2001   W/  BILATERAL TUBAL LIGATION w/ last c/s  . COLONOSCOPY  11/01/2017  . HYSTEROSCOPY W/ ENDOMETRIAL ABLATION  2019  . LUMBAR LAMINECTOMY/DECOMPRESSION MICRODISCECTOMY  2013   L4  --- S1  . SHOULDER ARTHROSCOPY Right 08/28/2020   Procedure: ARTHROSCOPY SHOULDER lysis of adhesions and manipulation under anesthesia, extensive debridement;  Surgeon: Nicholes Stairs, MD;  Location: [facility];  Service: Orthopedics;  Laterality: Right;    There were no vitals filed for this visit.   Subjective Assessment - 10/13/20 1152    Subjective Lori King was good on Friday after all her exercise, Did a lot of cleaning on Saturday and was sore Sunday.  Today she is alittle sore 2/10 Rt upper trap    Patient Stated Goals be pain free for the first time in a year and a half    Currently in Pain? Yes    Pain Score 2     Pain Location Back    Pain Orientation  Right    Pain Descriptors / Indicators Aching    Pain Type Acute pain    Aggravating Factors  cleaning    Pain Relieving Factors ice              [facility] PT Assessment - 10/13/20 0001      Assessment   Medical Diagnosis Rt shoulder manipulation, debridement and bicep tendon debridement    Referring Provider (PT) Dr Victorino December    Next MD Visit 10/14/2020      Observation/Other Assessments   Focus on Therapeutic Outcomes (FOTO)  64      AROM   Right Shoulder Extension 53 Degrees    Right Shoulder Flexion 150 Degrees    Right Shoulder ABduction 165 Degrees    Right Shoulder Internal Rotation 84 Degrees    Right Shoulder External Rotation 70 Degrees   sore after cleaning     Strength   Overall Strength Comments --   mid trap Rt 4/5, Lt 4+/5   Right Shoulder Flexion --   5-/5   Right Shoulder ABduction 4+/5    Right Shoulder Internal Rotation 5/5    Right Shoulder External Rotation --   5-/5   Left Shoulder Flexion 5/5    Left Shoulder ABduction 5/5  Left Shoulder Internal Rotation 5/5    Left Shoulder External Rotation 5/5      Palpation   Palpation comment tight in Lt deltoid                         [facility] Adult PT Treatment/Exercise - 10/13/20 0001      Shoulder Exercises: Supine   Other Supine Exercises 3x7 overhead lat work with 4# wt      Shoulder Exercises: Seated   Other Seated Exercises 2x10 reps , red band, serratus shoulder flexion lifts      Modalities   Modalities Moist Heat      Moist Heat Therapy   Number Minutes Moist Heat 10 Minutes    Moist Heat Location Shoulder      Manual Therapy   Manual therapy comments skilled palpation and monitoring of soft tissue during DN    Soft tissue mobilization STM and TPR to Rt deltoid    Kinesiotex Create Space      Kinesiotix   Create Space star tape over deltoid incision. Other tape not applied to allow her skin to rest as she had a small rash            Trigger Point Dry Needling -  10/13/20 0001    Consent Given? Yes    Education Handout Provided Previously provided    Electrical Stimulation Performed with Dry Needling Yes    Deltoid Response Palpable increased muscle length;Twitch response elicited   with stim                         PT Long Term Goals - 10/13/20 1209      PT LONG TERM GOAL #1   Title I with advanced HEP    Status On-going    Target Date 11/06/20      PT LONG TERM GOAL #2   Title improve muscle strength of mid back and Rt shoulder abduction =/> 4+/5 to assist with return to normal activities    Status Partially Met    Target Date 11/06/20      PT LONG TERM GOAL #3   Title Patient to report R shoulder and elbow pain as being no more than 2/10 at worst    Baseline pt reports 70% improvement, pain did go higher after cleaning her house    Status Partially Met    Target Date 11/06/20      PT LONG TERM GOAL #4   Title Patient to be able to reach overhead with 8-10# with R UE with pain not exceeding 2/10    Baseline able to lift 3-5#    Status On-going    Target Date 11/06/20      PT LONG TERM GOAL #5   Title 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  Goals are partially reached, ROM and strength are improving as is her ability to perform functional activities.  She does still have some weakness in the Rt shoulder and  back and pain when she uses the arm more for cleaning and functional activities.  We are going to decrease her frequency to once a week with focus on strengthening and return to all activities.  Pt does occassionally have some tightness/spasm in the deltoid, this settles down with dry needling and manual work.    Rehab  Potential Good    PT Frequency 1x / week    PT Duration 6 weeks    PT Treatment/Interventions ADLs/Self Care Home Management;Cryotherapy;Electrical Stimulation;Iontophoresis 59m/ml Dexamethasone;Moist Heat;Ultrasound;Functional mobility training;Therapeutic activities;Therapeutic exercise;Balance training;Neuromuscular re-education;Patient/family education;Orthotic Fit/Training;Manual techniques;Passive range of motion;Dry needling;Vasopneumatic Device;Taping;Joint Manipulations;Spinal Manipulations    PT Next Visit Plan continue with functional shoulder and upper back strengthening    PT Home Exercise Plan Access Code: [email or web address]   Consulted and Agree with Plan of Care Patient           Patient will benefit from skilled therapeutic intervention in order to improve the following deficits and impairments:  Decreased range of motion,Decreased coordination,Increased fascial restricitons,Increased muscle spasms,Impaired UE functional use,Pain,Hypomobility,Impaired flexibility,Improper body mechanics,Decreased mobility,Decreased strength,Postural dysfunction  Visit Diagnosis: Chronic right shoulder pain  Muscle weakness (generalized)  Pain in right elbow  Other symptoms and signs involving the musculoskeletal system     Problem List There are no problems to display for this patient.   SJeral PinchPT  10/13/2020, 12:40 PM  [facility] [address]Phone: [phone]  Fax:  [phone] Name: Lori KIGERMRN: [id number] of Birth: 302/07/1966

## 2020-10-22 ENCOUNTER — Ambulatory Visit: Payer: BC Managed Care – PPO | Admitting: Physical Therapy

## 2020-10-22 ENCOUNTER — Other Ambulatory Visit: Payer: Self-pay

## 2020-10-22 ENCOUNTER — Encounter: Payer: Self-pay | Admitting: Physical Therapy

## 2020-10-22 DIAGNOSIS — M25511 Pain in right shoulder: Secondary | ICD-10-CM | POA: Diagnosis not present

## 2020-10-22 DIAGNOSIS — M25521 Pain in right elbow: Secondary | ICD-10-CM

## 2020-10-22 DIAGNOSIS — G8929 Other chronic pain: Secondary | ICD-10-CM

## 2020-10-22 DIAGNOSIS — R29898 Other symptoms and signs involving the musculoskeletal system: Secondary | ICD-10-CM | POA: Diagnosis not present

## 2020-10-22 DIAGNOSIS — M6281 Muscle weakness (generalized): Secondary | ICD-10-CM | POA: Diagnosis not present

## 2020-10-22 NOTE — Therapy (Signed)
[place], [place], [address] Phone: [phone]   Fax:  [phone]  Physical Therapy Treatment  Patient Details  Name: ZENITH KERCHEVAL MRN: [id number] Date of Birth: 1966/05/31 Referring Provider (PT): Dr Victorino December   Encounter Date: 10/22/2020   PT End of Session - 10/22/20 1109    Visit Number 7    Number of Visits 12    Date for PT Re-Evaluation 11/06/20    Authorization Type BCBS    PT Start Time 1106    PT Stop Time 1150    PT Time Calculation (min) 44 min    Activity Tolerance Patient tolerated treatment well    Behavior During Therapy [facility] for tasks assessed/performed           Past Medical History:  Diagnosis Date  . Adhesive capsulitis of right shoulder   . Allergic rhinitis, seasonal   . Anemia   . Anxiety   . MDD (major depressive disorder)   . Wears contact lenses     Past Surgical History:  Procedure Laterality Date  . BREAST BIOPSY Left 2010   x2 per pt benign  . CESAREAN SECTION  X2  last one 02-28-2001   W/  BILATERAL TUBAL LIGATION w/ last c/s  . COLONOSCOPY  11/01/2017  . HYSTEROSCOPY W/ ENDOMETRIAL ABLATION  2019  . LUMBAR LAMINECTOMY/DECOMPRESSION MICRODISCECTOMY  2013   L4  --- S1  . SHOULDER ARTHROSCOPY Right 08/28/2020   Procedure: ARTHROSCOPY SHOULDER lysis of adhesions and manipulation under anesthesia, extensive debridement;  Surgeon: Nicholes Stairs, MD;  Location: [facility];  Service: Orthopedics;  Laterality: Right;    There were no vitals filed for this visit.   Subjective Assessment - 10/22/20 1109    Subjective [place] [facility] [place] end (with float belt) 45 min every day.  Exercises are going well.  "Yesterday was great, I only took tylenol 1x".  She saw MD last week and told her she is doing well; returns in 5 wks.    Patient Stated Goals be pain free for the first time in a  year and a half    Currently in Pain? Yes    Pain Score 2     Pain Location Arm   forearm   Pain Orientation Right    Pain Descriptors / Indicators Aching    Aggravating Factors  riding in car    Pain Relieving Factors ice              [facility] PT Assessment - 10/22/20 0001      Assessment   Medical Diagnosis Rt shoulder manipulation, debridement and bicep tendon debridement    Referring Provider (PT) Dr Victorino December    Next MD Visit 11/18/20            [facility] Adult PT Treatment/Exercise - 10/22/20 0001      Exercises   Exercises Wrist      Shoulder Exercises: Seated   Horizontal ABduction Both;Strengthening;5 reps    Theraband Level (Shoulder Horizontal ABduction) Level 2 (Red)    Horizontal ABduction Limitations cues for neutral wrist.    External Rotation Both;10 reps;Strengthening    Theraband Level (Shoulder External Rotation) Level 2 (Red)    Other Seated Exercises Overhead press, no wt x 5 reps, with cues for form.    Other Seated Exercises verbally reviewed HEP      Shoulder Exercises: ROM/Strengthening  Nustep L4: arms only x 5 min for warm up.      Shoulder Exercises: Stretch   Corner Stretch 2 reps;20 seconds    Other Shoulder Stretches sitting with hands behind her (shoulder ext, wrist ext x 20 sec x 4 reps    Other Shoulder Stretches Rt shoulder ext stretch x 20 sec x 2 reps; Rt tricep stretch x 2 reps of 20 sec      Wrist Exercises   Other wrist exercises Rt wrist ext stretch with palm on wall (fingertips up) x 20 sec x 2 reps; Rt wrist flexion stretch x 2 reps of 20 sec      Manual Therapy   Soft tissue mobilization IASTM/STM/ TPR to Rt wrist flexors, extensors, deltoid and bicep to decrease fascial restrictions and improve mobility.      Neck Exercises: Stretches   Other Neck Stretches Rt upper trap/scalene stretch holding table with Rt hand and Lt lateral flexion x 20 sec x 2 reps                     PT Long Term Goals - 10/13/20 1209       PT LONG TERM GOAL #1   Title I with advanced HEP    Status On-going    Target Date 11/06/20      PT LONG TERM GOAL #2   Title improve muscle strength of mid back and Rt shoulder abduction =/> 4+/5 to assist with return to normal activities    Status Partially Met    Target Date 11/06/20      PT LONG TERM GOAL #3   Title Patient to report R shoulder and elbow pain as being no more than 2/10 at worst    Baseline pt reports 70% improvement, pain did go higher after cleaning her house    Status Partially Met    Target Date 11/06/20      PT LONG TERM GOAL #4   Title Patient to be able to reach overhead with 8-10# with R UE with pain not exceeding 2/10    Baseline able to lift 3-5#    Status On-going    Target Date 11/06/20      PT LONG TERM GOAL #5   Title 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  She has difficulty tolerating doorway stretch due to increased pain in Rt forearm; improved tolerance with corner stretch.  She reported reduction of pain in lateral shoulder and forearm at end of session.  Pt making gradual gains towards goals.    Rehab Potential Good    PT Frequency 1x / week    PT Duration 6 weeks    PT Treatment/Interventions ADLs/Self Care Home Management;Cryotherapy;Electrical Stimulation;Iontophoresis 19m/ml Dexamethasone;Moist Heat;Ultrasound;Functional mobility training;Therapeutic activities;Therapeutic exercise;Balance training;Neuromuscular re-education;Patient/family education;Orthotic Fit/Training;Manual techniques;Passive range of motion;Dry needling;Vasopneumatic Device;Taping;Joint Manipulations;Spinal  Manipulations    PT Next Visit Plan continue with functional shoulder and upper back strengthening - including overhead (per goal)   PT Home  Exercise Plan Access Code: [email or web address]    Consulted and Agree with Plan of Care Patient           Patient will benefit from skilled therapeutic intervention in order to improve the following deficits and impairments:  Decreased range of motion,Decreased coordination,Increased fascial restricitons,Increased muscle spasms,Impaired UE functional use,Pain,Hypomobility,Impaired flexibility,Improper body mechanics,Decreased mobility,Decreased strength,Postural dysfunction  Visit Diagnosis: Chronic right shoulder pain  Muscle weakness (generalized)  Pain in right elbow  Other symptoms and signs involving the musculoskeletal system     Problem List There are no problems to display for this patient.  Kerin Perna, PTA 10/22/20 12:34 PM  [place], [place], [address] Phone: [phone]   Fax:  [phone]  Name: CHETARA KROPP MRN: [id number] Date of Birth: [birth year]

## 2020-10-29 ENCOUNTER — Encounter: Payer: Self-pay | Admitting: Physical Therapy

## 2020-10-29 ENCOUNTER — Other Ambulatory Visit: Payer: Self-pay

## 2020-10-29 ENCOUNTER — Ambulatory Visit: Payer: BC Managed Care – PPO | Admitting: Physical Therapy

## 2020-10-29 DIAGNOSIS — M6281 Muscle weakness (generalized): Secondary | ICD-10-CM | POA: Diagnosis not present

## 2020-10-29 DIAGNOSIS — M25521 Pain in right elbow: Secondary | ICD-10-CM | POA: Diagnosis not present

## 2020-10-29 DIAGNOSIS — M25511 Pain in right shoulder: Secondary | ICD-10-CM

## 2020-10-29 DIAGNOSIS — G8929 Other chronic pain: Secondary | ICD-10-CM

## 2020-10-29 NOTE — Patient Instructions (Signed)
Access Code: [email or web address] URL: [email or web address] Date: 10/29/2020 Prepared by: [place]  Exercises Sidelying Thoracic Rotation with Open Book - 1 x daily - 2 sets - 3-5 reps - 5-10 seconds hold Standing Overhead Triceps Stretch - 1 x daily - 7 x weekly - 1 sets - 2 reps - 20 seconds hold Standing Shoulder Internal Rotation Stretch with Towel - 1 x daily - 7 x weekly - 1 sets - 2 reps - 15- 30 seconds hold Child's Pose with Sidebending - 1 x daily - 7 x weekly - 1 sets - 2 reps - 30 seconds hold Seated Shoulder Horizontal Abduction with Resistance - Palms Down - 1 x daily - 3 x weekly - 2 sets - 10 reps Shoulder External Rotation and Scapular Retraction with Resistance - 1 x daily - 3 x weekly - 2 sets - 10 reps Standing Elbow Extension with Anchored Resistance - 1 x daily - 3 x weekly - 2 sets - 10 reps Supine shoulder Overhead Flexion with weight - 1 x daily - 3 x weekly - 2 sets - 7-10 reps Corner Pec Major Stretch - 1 x daily - 7 x weekly - 1 sets - 2-3 reps - 15-30 hold Wrist Extension Stretch at Wall - 1 x daily - 7 x weekly - 2-3 reps - 30 seconds hold Single Arm Doorway Pec Stretch at 120 Degrees Abduction - 1 x daily - 7 x weekly - 1 sets - 2 reps - 20 hold Supine Chest Stretch with Elbows Bent - 1 x daily - 7 x weekly - 1 sets - 2 reps - 30 hold Seated Cervical Sidebending Stretch - 1 x daily - 7 x weekly - 1 sets - 2 reps - 20 hold

## 2020-10-29 NOTE — Therapy (Signed)
[place], [place], [address] Phone: [phone]   Fax:  [phone]  Physical Therapy Treatment  Patient Details  Name: Lori King MRN: [id number] Date of Birth: [birth year] Referring Provider (PT): Dr Victorino December   Encounter Date: 10/29/2020   PT End of Session - 10/29/20 1114    Visit Number 8    Number of Visits 12    Date for PT Re-Evaluation 11/06/20    Authorization Type BCBS    PT Start Time 1105    PT Stop Time 1146    PT Time Calculation (min) 41 min    Activity Tolerance Patient tolerated treatment well    Behavior During Therapy [facility] for tasks assessed/performed           Past Medical History:  Diagnosis Date  . Adhesive capsulitis of right shoulder   . Allergic rhinitis, seasonal   . Anemia   . Anxiety   . MDD (major depressive disorder)   . Wears contact lenses     Past Surgical History:  Procedure Laterality Date  . BREAST BIOPSY Left 2010   x2 per pt benign  . CESAREAN SECTION  X2  last one 02-28-2001   W/  BILATERAL TUBAL LIGATION w/ last c/s  . COLONOSCOPY  11/01/2017  . HYSTEROSCOPY W/ ENDOMETRIAL ABLATION  2019  . LUMBAR LAMINECTOMY/DECOMPRESSION MICRODISCECTOMY  2013   L4  --- S1  . SHOULDER ARTHROSCOPY Right 08/28/2020   Procedure: ARTHROSCOPY SHOULDER lysis of adhesions and manipulation under anesthesia, extensive debridement;  Surgeon: Nicholes Stairs, MD;  Location: [facility];  Service: Orthopedics;  Laterality: Right;    There were no vitals filed for this visit.   Subjective Assessment - 10/29/20 1115    Subjective Pt reports she is treading water ~45-60 min with arm work. She continues to have some discomfort in Rt forearm and sporadic numbness and tingling in her ring and pinky finger.    Currently in Pain? Yes    Pain Score 1     Pain Location Arm   forearm   Pain Orientation Right;Lateral    Pain Descriptors /  Indicators Aching    Aggravating Factors  doorway stretch    Pain Relieving Factors massage. pain med              [facility] PT Assessment - 10/29/20 0001      Assessment   Medical Diagnosis Rt shoulder manipulation, debridement and bicep tendon debridement    Referring Provider (PT) Dr Victorino December    Onset Date/Surgical Date 08/28/20    Hand Dominance Right    Next MD Visit 11/18/20    Prior Therapy yes - prior to surgery and for two weeks after surgery at another clinic            [facility] Adult PT Treatment/Exercise - 10/29/20 0001      Elbow Exercises   Elbow Flexion AROM;Both;10 reps   fingers like paddle, to simulate in-water     Shoulder Exercises: Standing   Horizontal ABduction Both;10 reps   and adduction, to simulate in-water exercise   Horizontal ABduction Limitations arms abdct to 90 deg    External Rotation AROM;Both;10 reps   elbows at side; to simulate in-water exercise.   Flexion Limitations Rt shoulder flexion, lifting 4# from counter to shoulder height shelf x 10, 5# x 5 reps    Extension Limitations flexion extension with elbows straight and fingers together for paddle x  10, to simulate in-water exercise.    Row Strengthening;Both;10 reps   holding noodle, simulating for in-water use.     Shoulder Exercises: ROM/Strengthening   UBE (Upper Arm Bike) L3: alt fwd/bwd x 4 min      Shoulder Exercises: Stretch   Corner Stretch 2 reps;30 seconds    Internal Rotation Stretch 2 reps   30 sec, arm relaxed with strap assist   Star Gazer Stretch 2 reps;20 seconds   performed in standing, but will do supine at home.   Other Shoulder Stretches trial of midlevel bilat doorway stretch x 2 reps of 15 sec - stopped due to elbow pain.    Other Shoulder Stretches Rt shoulder horiz abdct stretch with arm straight against wall (elbow straight) x 20 sec x 2 reps for pec and bicep stretch; shoulder ext stretch with hands laced behind back and elbows straight.  Unilateral Rt high  doorway stretch x 2 reps of 15 sec (tolerated well).      Wrist Exercises   Other wrist exercises Rt wrist flex and ext stretches x 15 sec x 2 each      Neck Exercises: Stretches   Neck Stretch 2 reps;20 seconds   Lt lateral flexion with Rt arm behind back                   PT Short Term Goals - 09/25/20 1147      PT SHORT TERM GOAL #1   Title ------      PT SHORT TERM GOAL #2   Title ----      PT SHORT TERM GOAL #3   Title ------      PT SHORT TERM GOAL #4   Title ------             PT Long Term Goals - 10/13/20 1209      PT LONG TERM GOAL #1   Title I with advanced HEP    Status On-going    Target Date 11/06/20      PT LONG TERM GOAL #2   Title improve muscle strength of mid back and Rt shoulder abduction =/> 4+/5 to assist with return to normal activities    Status Partially Met    Target Date 11/06/20      PT LONG TERM GOAL #3   Title Patient to report R shoulder and elbow pain as being no more than 2/10 at worst    Baseline pt reports 70% improvement, pain did go higher after cleaning her house    Status Partially Met    Target Date 11/06/20      PT LONG TERM GOAL #4   Title Patient to be able to reach overhead with 8-10# with R UE with pain not exceeding 2/10    Baseline able to lift 3-5#    Status On-going    Target Date 11/06/20      PT LONG TERM GOAL #5   Title 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  Instructed pt in UE exercises  for water, since she is attending pool 5x/wk.  She tolerated all well.  She is unable to tolerate doorway stretch, but is able to tolerate corner stretch.  Pt making great progress towards remaining  goals.    Rehab Potential Good    PT Frequency 1x / week    PT Duration 6 weeks    PT Treatment/Interventions ADLs/Self Care Home Management;Cryotherapy;Electrical Stimulation;Iontophoresis 8m/ml Dexamethasone;Moist Heat;Ultrasound;Functional mobility training;Therapeutic activities;Therapeutic exercise;Balance training;Neuromuscular re-education;Patient/family education;Orthotic Fit/Training;Manual techniques;Passive range of motion;Dry needling;Vasopneumatic Device;Taping;Joint Manipulations;Spinal Manipulations    PT Next Visit Plan continue with functional shoulder and upper back strengthening    PT Home Exercise Plan Access Code: [email or web address]   Consulted and Agree with Plan of Care Patient           Patient will benefit from skilled therapeutic intervention in order to improve the following deficits and impairments:  Decreased range of motion,Decreased coordination,Increased fascial restricitons,Increased muscle spasms,Impaired UE functional use,Pain,Hypomobility,Impaired flexibility,Improper body mechanics,Decreased mobility,Decreased strength,Postural dysfunction  Visit Diagnosis: Chronic right shoulder pain  Muscle weakness (generalized)  Pain in right elbow     Problem List There are no problems to display for this patient.  JKerin Perna PTA 10/29/20 1:00 PM  [place] [address]Phone: [phone]  Fax:  [phone] Name: Lori JAHNMRN: [id number] of Birth: 3Sep 25, 1967

## 2020-11-05 ENCOUNTER — Encounter: Payer: Self-pay | Admitting: Physical Therapy

## 2020-11-05 ENCOUNTER — Ambulatory Visit: Payer: BC Managed Care – PPO | Admitting: Physical Therapy

## 2020-11-05 ENCOUNTER — Other Ambulatory Visit: Payer: Self-pay

## 2020-11-05 DIAGNOSIS — G8929 Other chronic pain: Secondary | ICD-10-CM | POA: Diagnosis not present

## 2020-11-05 DIAGNOSIS — M25521 Pain in right elbow: Secondary | ICD-10-CM

## 2020-11-05 DIAGNOSIS — M25511 Pain in right shoulder: Secondary | ICD-10-CM

## 2020-11-05 DIAGNOSIS — M6281 Muscle weakness (generalized): Secondary | ICD-10-CM

## 2020-11-05 NOTE — Therapy (Addendum)
[place], [place], [address] Phone: [phone]   Fax:  [phone]  Physical Therapy Treatment and Renewal Summary  Patient Details  Name: ARPI DIEBOLD MRN: [id number] Date of Birth: [birth year] Referring Provider (PT): Dr Victorino December   Encounter Date: 11/05/2020   PT End of Session - 11/05/20 1121    Visit Number 9    Number of Visits 12    Date for PT Re-Evaluation 11/06/20    Authorization Type BCBS    PT Start Time 1103    PT Stop Time 1148    PT Time Calculation (min) 45 min    Activity Tolerance Patient tolerated treatment well    Behavior During Therapy [facility] for tasks assessed/performed           Past Medical History:  Diagnosis Date  . Adhesive capsulitis of right shoulder   . Allergic rhinitis, seasonal   . Anemia   . Anxiety   . MDD (major depressive disorder)   . Wears contact lenses     Past Surgical History:  Procedure Laterality Date  . BREAST BIOPSY Left 2010   x2 per pt benign  . CESAREAN SECTION  X2  last one 02-28-2001   W/  BILATERAL TUBAL LIGATION w/ last c/s  . COLONOSCOPY  11/01/2017  . HYSTEROSCOPY W/ ENDOMETRIAL ABLATION  2019  . LUMBAR LAMINECTOMY/DECOMPRESSION MICRODISCECTOMY  2013   L4  --- S1  . SHOULDER ARTHROSCOPY Right 08/28/2020   Procedure: ARTHROSCOPY SHOULDER lysis of adhesions and manipulation under anesthesia, extensive debridement;  Surgeon: Nicholes Stairs, MD;  Location: [facility];  Service: Orthopedics;  Laterality: Right;    There were no vitals filed for this visit.   Subjective Assessment - 11/05/20 1107    Subjective "I was really sore Thur, Friday, Saturday". she is unsure if it is from the doorway stretch or lifting 5# to eye level shelf.  She reports she was been in pool every day during week, treading and doing UE exercises with floatation dumbells.    Currently in Pain? Yes    Pain Score 1     Pain  Location Shoulder    Pain Orientation Right;Lateral    Pain Descriptors / Indicators Aching    Aggravating Factors  doorway stretch    Pain Relieving Factors medication, ice              [facility] PT Assessment - 11/05/20 0001      Assessment   Medical Diagnosis Rt shoulder manipulation, debridement and bicep tendon debridement    Referring Provider (PT) Dr Victorino December    Onset Date/Surgical Date 08/28/20    Hand Dominance Right    Next MD Visit 11/25/20    Prior Therapy yes - prior to surgery and for two weeks after surgery at another clinic      Observation/Other Assessments   Focus on Therapeutic Outcomes (FOTO)  FS = 65      Strength   Right Shoulder Extension 5/5    Right Shoulder ABduction 4/5    Right Shoulder Horizontal ABduction 4-/5    Right Elbow Extension 5/5    Left Elbow Flexion 5/5            [facility] Adult PT Treatment/Exercise - 11/05/20 0001      Shoulder Exercises: Supine   Flexion Both;10 reps   overhead, pausing 3 sec for stretch.   Shoulder Flexion Weight (lbs) 3    Diagonals Right;10  reps;Theraband   2 sets   Theraband Level (Shoulder Diagonals) Level 2 (Red)      Shoulder Exercises: Sidelying   Other Sidelying Exercises Rt rotation open book x 3, repeated 5 times with red band.      Shoulder Exercises: ROM/Strengthening   Nustep L5: arms only x 6 min for warm up.      Shoulder Exercises: Stretch   Star Gazer Stretch 3 reps;20 seconds      Manual Therapy   Soft tissue mobilization IASTM/ STM to Rt tricep, deltoid, infraspinatus         Reviewed land and aquatic exercises for shoulder.          PT Education - 11/05/20 1154    Education Details added d2 flexion in supine    Person(s) Educated Patient    Methods Explanation;Handout;Demonstration    Comprehension Verbalized understanding;Returned demonstration            PT Long Term Goals - 11/05/20 1111      PT LONG TERM GOAL #1   Title I with advanced HEP    Status On-going       PT LONG TERM GOAL #2   Title improve muscle strength of mid back and Rt shoulder abduction =/> 4+/5 to assist with return to normal activities    Status Partially Met      PT LONG TERM GOAL #3   Title Patient to report R shoulder and elbow pain as being no more than 2/10 at worst    Status Achieved      PT LONG TERM GOAL #4   Title Patient to be able to reach overhead with 8-10# with R UE with pain not exceeding 2/10    Baseline able to lift 8# to eye level shelf x 1 without pain.    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    Baseline 65%    Time 6    Period Weeks    Status Revised    Target Date 12/17/20      PT LONG TERM GOAL #4   Title The patient will be able to return to rowing for community exercise.    Time 6    Period Weeks    Status New    Target Date 12/17/20             Plan - 11/05/20 1250  Clinical Impression Statement [name] is tolerating aquatic HEP exercises for shoulder well; modified land based exercises to tolerance. Pt's FOTO score slightly improved from last assessment.  Rt shoulder strength gradually improving. Pt is now able to lift 8# to eye level shelf without pain.  She has partially met her goals and will benefit from continued PT intervention to  maximize functional mobility.    Rehab Potential Good    PT Frequency 1x / week    PT Duration 6 weeks    PT Treatment/Interventions ADLs/Self Care Home Management;Cryotherapy;Electrical Stimulation;Iontophoresis 37m/ml Dexamethasone;Moist Heat;Ultrasound;Functional mobility training;Therapeutic activities;Therapeutic exercise;Balance training;Neuromuscular re-education;Patient/family education;Orthotic Fit/Training;Manual techniques;Passive range of motion;Dry needling;Vasopneumatic Device;Taping;Joint Manipulations;Spinal Manipulations    PT Next Visit Plan continue with functional shoulder and upper back strengthening    PT Home Exercise Plan Access Code: [email or web address]   Consulted and Agree with Plan of Care Patient           Patient will benefit from skilled therapeutic intervention in order to improve the following deficits and impairments:  Decreased range of motion,Decreased coordination,Increased fascial restricitons,Increased muscle spasms,Impaired UE functional use,Pain,Hypomobility,Impaired flexibility,Improper body mechanics,Decreased mobility,Decreased strength,Postural dysfunction  Visit Diagnosis: Chronic right shoulder pain  Muscle weakness (generalized)  Pain in right elbow     Problem List There are no problems to display for this patient.   WRudell Cobb PT  JKerin Perna PTA 11/05/20 1:03 PM  [place] [address]Phone: [phone]  Fax:  [phone] Name: CRHESA FORSBERGMRN: [id number] of Birth: 308-14-1967

## 2020-11-05 NOTE — Patient Instructions (Signed)
Access Code: [email or web address] URL: [email or web address] Date: 11/05/2020 Prepared by: Dickens  Exercises Sidelying Thoracic Rotation with Open Book - 1 x daily - 2 sets - 3-5 reps - 5-10 seconds hold Standing Overhead Triceps Stretch - 1 x daily - 7 x weekly - 1 sets - 2 reps - 20 seconds hold Standing Shoulder Internal Rotation Stretch with Towel - 1 x daily - 7 x weekly - 1 sets - 2 reps - 15- 30 seconds hold Child's Pose with Sidebending - 1 x daily - 7 x weekly - 1 sets - 2 reps - 30 seconds hold Seated Shoulder Horizontal Abduction with Resistance - Palms Down - 1 x daily - 3 x weekly - 2 sets - 10 reps Shoulder External Rotation and Scapular Retraction with Resistance - 1 x daily - 3 x weekly - 2 sets - 10 reps Standing Elbow Extension with Anchored Resistance - 1 x daily - 3 x weekly - 2 sets - 10 reps Standing Shoulder Single Arm PNF D2 Flexion with Resistance - 1 x daily - 3 x weekly - 2-3 sets - 10 reps Supine shoulder Overhead Flexion with weight - 1 x daily - 3 x weekly - 2 sets - 7-10 reps Corner Pec Major Stretch - 1 x daily - 7 x weekly - 1 sets - 2-3 reps - 15-30 hold Wrist Extension Stretch at Wall - 1 x daily - 7 x weekly - 2-3 reps - 30 seconds hold Supine Chest Stretch with Elbows Bent - 1 x daily - 7 x weekly - 1 sets - 2 reps - 30 hold Seated Cervical Sidebending Stretch - 1 x daily - 7 x weekly - 1 sets - 2 reps - 20 hold

## 2020-11-10 NOTE — Addendum Note (Signed)
Addended by: Rudell Cobb M on: 11/10/2020 02:22 PM   Modules accepted: Orders

## 2020-11-12 ENCOUNTER — Ambulatory Visit: Payer: BC Managed Care – PPO | Admitting: Physical Therapy

## 2020-11-12 ENCOUNTER — Other Ambulatory Visit: Payer: Self-pay

## 2020-11-12 DIAGNOSIS — R29898 Other symptoms and signs involving the musculoskeletal system: Secondary | ICD-10-CM

## 2020-11-12 DIAGNOSIS — M25521 Pain in right elbow: Secondary | ICD-10-CM | POA: Diagnosis not present

## 2020-11-12 DIAGNOSIS — G8929 Other chronic pain: Secondary | ICD-10-CM

## 2020-11-12 DIAGNOSIS — M25511 Pain in right shoulder: Secondary | ICD-10-CM | POA: Diagnosis not present

## 2020-11-12 DIAGNOSIS — M6281 Muscle weakness (generalized): Secondary | ICD-10-CM

## 2020-11-12 NOTE — Therapy (Signed)
[place], [place], [address] Phone: [phone]   Fax:  [phone]  Physical Therapy Treatment  Patient Details  Name: Lori King MRN: [id number] Date of Birth: 1965-12-22 Referring Provider (PT): Dr Victorino December   Encounter Date: 11/12/2020   PT End of Session - 11/12/20 0933    Visit Number 10    Number of Visits 18    Date for PT Re-Evaluation 12/17/20    Authorization Type BCBS    PT Start Time 0934    PT Stop Time 1017    PT Time Calculation (min) 43 min    Activity Tolerance Patient tolerated treatment well    Behavior During Therapy [facility] for tasks assessed/performed           Past Medical History:  Diagnosis Date  . Adhesive capsulitis of right shoulder   . Allergic rhinitis, seasonal   . Anemia   . Anxiety   . MDD (major depressive disorder)   . Wears contact lenses     Past Surgical History:  Procedure Laterality Date  . BREAST BIOPSY Left 2010   x2 per pt benign  . CESAREAN SECTION  X2  last one 02-28-2001   W/  BILATERAL TUBAL LIGATION w/ last c/s  . COLONOSCOPY  11/01/2017  . HYSTEROSCOPY W/ ENDOMETRIAL ABLATION  2019  . LUMBAR LAMINECTOMY/DECOMPRESSION MICRODISCECTOMY  2013   L4  --- S1  . SHOULDER ARTHROSCOPY Right 08/28/2020   Procedure: ARTHROSCOPY SHOULDER lysis of adhesions and manipulation under anesthesia, extensive debridement;  Surgeon: Nicholes Stairs, MD;  Location: [facility];  Service: Orthopedics;  Laterality: Right;    There were no vitals filed for this visit.   Subjective Assessment - 11/12/20 0935    Subjective Pt reports she has not been to the pool this week, because the pump is broken.  She reports improvement in symptoms. "I've been icing a lot less.  I haven't needed the tylenol in a couple days". She continues to take meloxicam 2x/day and gabapentin at night.  She reports she used row machine for 6 min on light  resistance.    Patient Stated Goals be pain free for the first time in a year and a half    Currently in Pain? No/denies    Pain Score 0-No pain              [facility] PT Assessment - 11/12/20 0001      Assessment   Medical Diagnosis Rt shoulder manipulation, debridement and bicep tendon debridement    Referring Provider (PT) Dr Victorino December    Onset Date/Surgical Date 08/28/20    Hand Dominance Right    Next MD Visit 11/25/20    Prior Therapy yes - prior to surgery and for two weeks after surgery at another clinic            [facility] Adult PT Treatment/Exercise - 11/12/20 0001      Shoulder Exercises: Supine   Diagonals Right;10 reps;Theraband    Theraband Level (Shoulder Diagonals) Level 3 (Green)      Shoulder Exercises: Prone   Horizontal ABduction 1 Strengthening;Right;10 reps   5 reps without wt, 5 with wt.   Horizontal ABduction 1 Weight (lbs) 1      Shoulder Exercises: Sidelying   Other Sidelying Exercises Rt rotation open book x 3, repeated 4 times with green band.  After STM, repeated open book 1x, then full circumduction CW/CCW x 2 reps  Shoulder Exercises: Standing   Row Strengthening;Both;20 reps    Theraband Level (Shoulder Row) Level 3 (Green)    Other Standing Exercises Rt D1 and D2 ext with green band x 10 reps each.      Shoulder Exercises: ROM/Strengthening   UBE (Upper Arm Bike) L5: 2 min forward, 1 min backward      Shoulder Exercises: Stretch   Other Shoulder Stretches trial of midlevel unilateral doorway stretch (90 should abdct, 90 elbow flexion) x 15 sec - stopped due to elbow pain.    Other Shoulder Stretches Rt shoulder horiz abdct stretch with arm straight against wall (elbow straight) x 20 sec for pec and bicep stretch      Manual Therapy   Soft tissue mobilization IASTM / STM to Rt prox wrist flex/extensors, Rt biceps brachii, brachialis, tricep, deltoid, pec major, and infraspinatus to decrease fascial restrictions and improve ROM.                       PT Long Term Goals - 11/12/20 1032      PT LONG TERM GOAL #1   Title I with advanced HEP    Time 6    Period Weeks    Status Revised      PT LONG TERM GOAL #2   Title improve muscle strength of mid back and Rt shoulder abduction =/> 4+/5 to assist with return to normal activities    Time 6    Period Weeks    Status Revised      PT LONG TERM GOAL #3   Title IMprove FOTO to > or equal to 72%.    Baseline 65%    Time 6    Period Weeks    Status Revised      PT LONG TERM GOAL #4   Title The patient will be able to return to rowing for community exercise.    Time 6    Period Weeks    Status New                 Plan - 11/12/20 1032    Clinical Impression Statement Continued gradual improvement in Rt shoulder ROM and functional mobility with less pain.  Persistant tightness found in Rt pec major, brachioradialis, and biceps brachii; improved with IASTM and stretches.  Pt tolerated all exercises well, without pain, but required minor cues for scapular positioning.  Pt is making great progress towards LTGs.    Rehab Potential Good    PT Frequency 1x / week    PT Duration 6 weeks    PT Treatment/Interventions ADLs/Self Care Home Management;Cryotherapy;Electrical Stimulation;Iontophoresis 4mg /ml Dexamethasone;Moist Heat;Ultrasound;Functional mobility training;Therapeutic activities;Therapeutic exercise;Balance training;Neuromuscular re-education;Patient/family education;Orthotic Fit/Training;Manual techniques;Passive range of motion;Dry needling;Vasopneumatic Device;Taping;Joint Manipulations;Spinal Manipulations    PT Next Visit Plan continue with functional shoulder and upper back strengthening    PT Home Exercise Plan Access Code: [email or web address]    Consulted and Agree with Plan of Care Patient           Patient will benefit from skilled therapeutic intervention in order to improve the following deficits and impairments:  Decreased range of  motion,Decreased coordination,Increased fascial restricitons,Increased muscle spasms,Impaired UE functional use,Pain,Hypomobility,Impaired flexibility,Improper body mechanics,Decreased mobility,Decreased strength,Postural dysfunction  Visit Diagnosis: Chronic right shoulder pain  Muscle weakness (generalized)  Pain in right elbow  Other symptoms and signs involving the musculoskeletal system     Problem List There are no problems to display for this patient.   Kerin Perna, PTA 11/12/20  10:38 AM  [facility] [place], [place], [address] Phone: [phone]   Fax:  [phone]  Name: Lori King MRN: [id number] Date of Birth: Oct 08, 1965

## 2020-11-19 ENCOUNTER — Other Ambulatory Visit: Payer: Self-pay

## 2020-11-19 ENCOUNTER — Ambulatory Visit: Payer: BC Managed Care – PPO | Admitting: Physical Therapy

## 2020-11-19 ENCOUNTER — Encounter: Payer: Self-pay | Admitting: Physical Therapy

## 2020-11-19 DIAGNOSIS — M25511 Pain in right shoulder: Secondary | ICD-10-CM | POA: Diagnosis not present

## 2020-11-19 DIAGNOSIS — M6281 Muscle weakness (generalized): Secondary | ICD-10-CM

## 2020-11-19 DIAGNOSIS — G8929 Other chronic pain: Secondary | ICD-10-CM

## 2020-11-19 DIAGNOSIS — R29898 Other symptoms and signs involving the musculoskeletal system: Secondary | ICD-10-CM

## 2020-11-19 DIAGNOSIS — M25521 Pain in right elbow: Secondary | ICD-10-CM

## 2020-11-19 NOTE — Therapy (Signed)
[place] [facility] [place], [place], [address] Phone: [phone]   Fax:  [phone]  Physical Therapy Treatment  Patient Details  Name: Lori King MRN: [id number] Date of Birth: September 25, 1965 Referring Provider (PT): Dr Victorino December   Encounter Date: 11/19/2020   PT End of Session - 11/19/20 1111    Visit Number 11    Number of Visits 18    Date for PT Re-Evaluation 12/17/20    Authorization Type BCBS    PT Start Time 1105    PT Stop Time 1145    PT Time Calculation (min) 40 min    Activity Tolerance Patient tolerated treatment well    Behavior During Therapy [facility] for tasks assessed/performed           Past Medical History:  Diagnosis Date  . Adhesive capsulitis of right shoulder   . Allergic rhinitis, seasonal   . Anemia   . Anxiety   . MDD (major depressive disorder)   . Wears contact lenses     Past Surgical History:  Procedure Laterality Date  . BREAST BIOPSY Left 2010   x2 per pt benign  . CESAREAN SECTION  X2  last one 02-28-2001   W/  BILATERAL TUBAL LIGATION w/ last c/s  . COLONOSCOPY  11/01/2017  . HYSTEROSCOPY W/ ENDOMETRIAL ABLATION  2019  . LUMBAR LAMINECTOMY/DECOMPRESSION MICRODISCECTOMY  2013   L4  --- S1  . SHOULDER ARTHROSCOPY Right 08/28/2020   Procedure: ARTHROSCOPY SHOULDER lysis of adhesions and manipulation under anesthesia, extensive debridement;  Surgeon: Nicholes Stairs, MD;  Location: [facility];  Service: Orthopedics;  Laterality: Right;    There were no vitals filed for this visit.   Subjective Assessment - 11/19/20 1112    Subjective "I feel like I'm over the hump".  Pt reports she is no longer having constant pain.  She was able to carry beach bag and beach chair with Rt arm without difficulty. She was able to pick up her grandson (22#) without difficulty. No longer having pain down Rt arm. Yesterday she did not take meloxicam to see how she  would do and she reported soreness in Rt ant shoulder <1/10, so she took a dose.    Patient Stated Goals be pain free for the first time in a year and a half    Currently in Pain? Yes    Pain Score --   less than 1.   Pain Location Shoulder    Pain Orientation Right;Anterior    Pain Descriptors / Indicators Sore              [facility] PT Assessment - 11/19/20 0001      Assessment   Medical Diagnosis Rt shoulder manipulation, debridement and bicep tendon debridement    Referring Provider (PT) Dr Victorino December    Onset Date/Surgical Date 08/28/20    Hand Dominance Right    Next MD Visit 11/25/20    Prior Therapy yes - prior to surgery and for two weeks after surgery at another clinic      Observation/Other Assessments   Focus on Therapeutic Outcomes (FOTO)  FS = 68      AROM   Right Shoulder Flexion 155 Degrees    Right Shoulder ABduction 158 Degrees    Right Shoulder Internal Rotation --   thumb to T9   Right Shoulder External Rotation 81 Degrees   arm abdct 90 deg     Strength   Right  Shoulder Extension 5/5    Right Shoulder ABduction 4/5    Right Shoulder Internal Rotation 5/5    Right Shoulder External Rotation 4+/5    Right Shoulder Horizontal ABduction 4+/5    Right Shoulder Horizontal ADduction 4+/5            [facility] Adult PT Treatment/Exercise - 11/19/20 0001      Shoulder Exercises: Supine   Horizontal ABduction Both;10 reps    Theraband Level (Shoulder Horizontal ABduction) Level 2 (Red)    External Rotation Strengthening;Both;10 reps   2 sets   Theraband Level (Shoulder External Rotation) Level 2 (Red)    Flexion Both;10 reps   overhead, pausing 3 sec for stretch. 2 sets   Theraband Level (Shoulder Flexion) Level 2 (Red)    Diagonals Strengthening;Right;10 reps   2 sets   Theraband Level (Shoulder Diagonals) Level 2 (Red)      Shoulder Exercises: Sidelying   Other Sidelying Exercises Rt rotation open book x 8,  held 5-10 sec.      Shoulder Exercises:  Stretch   Corner Stretch 3 reps;20 seconds    Other Shoulder Stretches tricep stretch RUE x 20 sec x 2 reps,  bilat bicep stretch x 20 sec x 3 reps.  Wall ladder, flexion RUE x 1 reps      Manual Therapy   Soft tissue mobilization IASTM / STM to  Rt biceps brachii, brachialis, lateral deltoid, pec major, to decrease fascial restrictions and improve ROM.               PT Long Term Goals - 11/19/20 1135      PT LONG TERM GOAL #1   Title I with advanced HEP    Time 6    Period Weeks    Status Achieved      PT LONG TERM GOAL #2   Title improve muscle strength of mid back and Rt shoulder abduction =/> 4+/5 to assist with return to normal activities    Time 6    Period Weeks    Status Partially Met      PT LONG TERM GOAL #3   Title IMprove FOTO to > or equal to 72%.    Baseline 68% FS    Time 6    Period Weeks    Status Partially Met      PT LONG TERM GOAL #4   Title The patient will be able to return to rowing for community exercise.    Baseline able to complete 6 min.    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  Pt demonstrated improved Rt shoulder strength; continued limitation in abdct and ER.  Reviewed HEP and self massage techniques. Pt tolerated band exercises well, reporting fatigue but no pain.  Pt has partially met her goals and verbalized readiness  to d/c to HEP at this time.    Rehab Potential Good    PT Frequency 1x / week    PT Duration 6 weeks    PT Treatment/Interventions ADLs/Self Care Home Management;Cryotherapy;Electrical Stimulation;Iontophoresis 48m/ml Dexamethasone;Moist Heat;Ultrasound;Functional mobility training;Therapeutic activities;Therapeutic exercise;Balance training;Neuromuscular  re-education;Patient/family education;Orthotic Fit/Training;Manual techniques;Passive range of motion;Dry needling;Vasopneumatic Device;Taping;Joint Manipulations;Spinal Manipulations    PT Next Visit Plan continue with functional shoulder and upper back strengthening    PT Home Exercise Plan Access Code: [email or web address]    Consulted and Agree with Plan of Care Patient           Patient will benefit from skilled therapeutic intervention in order to improve the following deficits and impairments:  Decreased range of motion,Decreased coordination,Increased fascial restricitons,Increased muscle spasms,Impaired UE functional use,Pain,Hypomobility,Impaired flexibility,Improper body mechanics,Decreased mobility,Decreased strength,Postural dysfunction  Visit Diagnosis: Chronic right shoulder pain  Muscle weakness (generalized)  Pain in right elbow  Other symptoms and signs involving the musculoskeletal system     Problem List There are no problems to display for this patient.  Kerin Perna, PTA 11/19/20 1:01 PM  [place], [place], [address] Phone: [phone]   Fax:  [phone]  Name: LYNDEL DANCEL MRN: [id number] Date of Birth: [birth year]

## 2020-12-03 ENCOUNTER — Ambulatory Visit: Payer: BC Managed Care – PPO | Admitting: Physical Therapy

## 2020-12-03 ENCOUNTER — Encounter: Payer: Self-pay | Admitting: Physical Therapy

## 2020-12-03 ENCOUNTER — Other Ambulatory Visit: Payer: Self-pay

## 2020-12-03 DIAGNOSIS — M6281 Muscle weakness (generalized): Secondary | ICD-10-CM

## 2020-12-03 DIAGNOSIS — G8929 Other chronic pain: Secondary | ICD-10-CM | POA: Diagnosis not present

## 2020-12-03 DIAGNOSIS — M25511 Pain in right shoulder: Secondary | ICD-10-CM | POA: Diagnosis not present

## 2020-12-03 DIAGNOSIS — R29898 Other symptoms and signs involving the musculoskeletal system: Secondary | ICD-10-CM | POA: Diagnosis not present

## 2020-12-03 NOTE — Therapy (Signed)
[name], [place], [address] Phone: [phone]   Fax:  [phone]  Physical Therapy Treatment  Patient Details  Name: Lori King MRN: [id number] Date of Birth: 1965-10-25 Referring Provider (PT): Dr Victorino December   Encounter Date: 12/03/2020   PT End of Session - 12/03/20 1109    Visit Number 12    Number of Visits 18    Date for PT Re-Evaluation 12/17/20    Authorization Type BCBS    PT Start Time 1105    PT Stop Time 1152    PT Time Calculation (min) 47 min    Activity Tolerance Patient tolerated treatment well    Behavior During Therapy [facility] for tasks assessed/performed           Past Medical History:  Diagnosis Date  . Adhesive capsulitis of right shoulder   . Allergic rhinitis, seasonal   . Anemia   . Anxiety   . MDD (major depressive disorder)   . Wears contact lenses     Past Surgical History:  Procedure Laterality Date  . BREAST BIOPSY Left 2010   x2 per pt benign  . CESAREAN SECTION  X2  last one 02-28-2001   W/  BILATERAL TUBAL LIGATION w/ last c/s  . COLONOSCOPY  11/01/2017  . HYSTEROSCOPY W/ ENDOMETRIAL ABLATION  2019  . LUMBAR LAMINECTOMY/DECOMPRESSION MICRODISCECTOMY  2013   L4  --- S1  . SHOULDER ARTHROSCOPY Right 08/28/2020   Procedure: ARTHROSCOPY SHOULDER lysis of adhesions and manipulation under anesthesia, extensive debridement;  Surgeon: Nicholes Stairs, MD;  Location: [facility];  Service: Orthopedics;  Laterality: Right;    There were no vitals filed for this visit.   Subjective Assessment - 12/03/20 1108    Subjective Pt reports she had follow up with surgeon, and he would like her to work on ER and horiz abdct (pt demonstrates motions).  She is weaning herself off of meloxicam. She hasn't been in water in 3 wks (pool broken), so she has been doing her exercises 1x/day.  She states he would like her to have 6 more visits.     Patient Stated Goals be pain free for the first time in a year and a half    Currently in Pain? No/denies    Pain Score 0-No pain              [facility] PT Assessment - 12/03/20 0001      Assessment   Medical Diagnosis Rt shoulder manipulation, debridement and bicep tendon debridement    Referring Provider (PT) Dr Victorino December    Onset Date/Surgical Date 08/28/20    Hand Dominance Right    Next MD Visit PRN    Prior Therapy yes - prior to surgery and for two weeks after surgery at another clinic            [facility] Adult PT Treatment/Exercise - 12/03/20 0001      Shoulder Exercises: Supine   External Rotation Strengthening;Both;10 reps    Theraband Level (Shoulder External Rotation) Level 2 (Red)    Diagonals Strengthening;Right;10 reps;Theraband    Theraband Level (Shoulder Diagonals) Level 3 (Green)    Other Supine Exercises laying on pool noodle x 10 snow angels range to tolerance x 10    Other Supine Exercises D2 flexion with green band x 10 reps      Shoulder Exercises: Prone   External Rotation Right;5 reps    External Rotation Limitations tactile  cues to avoid shoulder hike      Shoulder Exercises: Sidelying   External Rotation Strengthening;Right;10 reps    External Rotation Weight (lbs) 2    ABduction AROM;Right;10 reps   with cues for scap depression   Other Sidelying Exercises Rt rotation open book x 5 with green band,  held 5-10 sec. then 4 reps without band.      Shoulder Exercises: Standing   Other Standing Exercises wall angels (with buttocks off the wall) - range to tolerance x 10, cues for arm position      Shoulder Exercises: ROM/Strengthening   UBE (Upper Arm Bike) L4: 1 min forward/ 1 min backward, 2 reps - 4 min total.      Shoulder Exercises: Stretch   Star Gazer Stretch 2 reps;20 seconds   with perpendicular pool noodle at bra strap   Other Shoulder Stretches Rt tricep stretch with door frame assist x 20 sec, thoracic ext with bent elbows x 20 sec.     Other Shoulder Stretches unilateral midlevel doorway stretch x 3 reps of 20 sec      Manual Therapy   Manual therapy comments supine/standing against pool noodle - therapist pressing shoulders back for pec minor stretch x 15 sec x 3 total reps.             PT Long Term Goals - 12/03/20 1213      PT LONG TERM GOAL #1   Title I with advanced HEP    Time 6    Period Weeks    Status Achieved      PT LONG TERM GOAL #2   Title improve muscle strength of mid back and Rt shoulder abduction =/> 4+/5 to assist with return to normal activities    Time 6    Period Weeks    Status Partially Met      PT LONG TERM GOAL #3   Title IMprove FOTO to > or equal to 72%.    Baseline 68% FS    Time 6    Period Weeks    Status Partially Met      PT LONG TERM GOAL #4   Title The patient will be able to return to rowing for community exercise.    Baseline able to complete 6 min.    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  Pt had limited tolerance for trial of prone Rt shoulder ER with arm abdct 90, elbow flexed 90 deg.  Otherwise pt tolerated all other exercises well.  Pt continues to make gradual progress towards remaining goals.    Rehab Potential Good    PT Frequency 1x / week    PT Duration 6 weeks    PT Treatment/Interventions ADLs/Self Care Home Management;Cryotherapy;Electrical Stimulation;Iontophoresis 41m/ml Dexamethasone;Moist Heat;Ultrasound;Functional mobility training;Therapeutic activities;Therapeutic exercise;Balance training;Neuromuscular re-education;Patient/family education;Orthotic Fit/Training;Manual techniques;Passive range of motion;Dry needling;Vasopneumatic  Device;Taping;Joint Manipulations;Spinal Manipulations    PT Next Visit Plan DN/manual to Rt shoulder/pec. continue progressive Rt shoulder ROM/ strengthening.    PT Home Exercise Plan Access  Code: [email or web address]    Consulted and Agree with Plan of Care Patient           Patient will benefit from skilled therapeutic intervention in order to improve the following deficits and impairments:  Decreased range of motion,Decreased coordination,Increased fascial restricitons,Increased muscle spasms,Impaired UE functional use,Pain,Hypomobility,Impaired flexibility,Improper body mechanics,Decreased mobility,Decreased strength,Postural dysfunction  Visit Diagnosis: Chronic right shoulder pain  Muscle weakness (generalized)  Other symptoms and signs involving the musculoskeletal system     Problem List There are no problems to display for this patient.  Kerin Perna, PTA 12/03/20 12:15 PM  [place], [place], [address] Phone: [phone]   Fax:  [phone]  Name: TIFFANE SHELDON MRN: [id number] Date of Birth: 1965-12-26

## 2020-12-10 ENCOUNTER — Ambulatory Visit: Payer: BC Managed Care – PPO | Admitting: Rehabilitative and Restorative Service Providers"

## 2020-12-10 ENCOUNTER — Other Ambulatory Visit: Payer: Self-pay

## 2020-12-10 ENCOUNTER — Encounter: Payer: Self-pay | Admitting: Rehabilitative and Restorative Service Providers"

## 2020-12-10 DIAGNOSIS — M6281 Muscle weakness (generalized): Secondary | ICD-10-CM | POA: Diagnosis not present

## 2020-12-10 DIAGNOSIS — M25511 Pain in right shoulder: Secondary | ICD-10-CM

## 2020-12-10 DIAGNOSIS — G8929 Other chronic pain: Secondary | ICD-10-CM

## 2020-12-10 DIAGNOSIS — R29898 Other symptoms and signs involving the musculoskeletal system: Secondary | ICD-10-CM | POA: Diagnosis not present

## 2020-12-10 NOTE — Patient Instructions (Signed)
Access Code: [email or web address] URL: [email or web address] Date: 12/10/2020 Prepared by: Rudell Cobb  Exercises Child's Pose with Sidebending - 1 x daily - 7 x weekly - 1 sets - 2 reps - 30 seconds hold Sidelying Thoracic Rotation with Open Book - 1 x daily - 7 x weekly - 2 sets - 3-5 reps - 5-10 seconds hold Snow Angels on Foam Roll - 1 x daily - 2-3 x weekly - 1 sets - 10 reps Supine shoulder Overhead Flexion with weight - 1 x daily - 3 x weekly - 2 sets - 7-10 reps Supine Chest Stretch with Elbows Bent - 1 x daily - 7 x weekly - 1 sets - 2 reps - 30 hold Seated Shoulder Horizontal Abduction with Resistance - Palms Down - 1 x daily - 3 x weekly - 2 sets - 10 reps Seated Cervical Sidebending Stretch - 1 x daily - 7 x weekly - 1 sets - 2 reps - 20 hold Standing Overhead Triceps Stretch - 1 x daily - 7 x weekly - 1 sets - 2 reps - 20 seconds hold Standing Shoulder Single Arm PNF D2 Flexion with Resistance - 1 x daily - 3 x weekly - 2-3 sets - 10 reps Corner Pec Major Stretch - 1 x daily - 7 x weekly - 1 sets - 2-3 reps - 15-30 hold Wrist Extension Stretch at Wall - 1 x daily - 7 x weekly - 2-3 reps - 30 seconds hold Chest and Bicep Stretch - Arms Behind Back - 1 x daily - 7 x weekly - 1 sets - 3 reps - 15-30 seconds hold Scaption with Dumbbells - 2 x daily - 3 x weekly - 1 sets - 10 reps Ulnar Nerve Flossing - 2 x daily - 7 x weekly - 1 sets - 10 reps Ulnar Nerve Flossing - 2 x daily - 7 x weekly - 1 sets - 10 reps

## 2020-12-10 NOTE — Therapy (Signed)
[place], [place], [address] Phone: [phone]   Fax:  [phone]  Physical Therapy Treatment  Patient Details  Name: Lori King MRN: [id number] Date of Birth: [birth year] Referring Provider (PT): Dr Lori King   Encounter Date: 12/10/2020   PT End of Session - 12/10/20 1238    Visit Number 13    Number of Visits 18    Date for PT Re-Evaluation 12/17/20    Authorization Type BCBS    Authorization - Visit Number 6    Authorization - Number of Visits 12    PT Start Time 0935    PT Stop Time 1015    PT Time Calculation (min) 40 min    Activity Tolerance Patient tolerated treatment well    Behavior During Therapy [facility] for tasks assessed/performed           Past Medical History:  Diagnosis Date  . Adhesive capsulitis of right shoulder   . Allergic rhinitis, seasonal   . Anemia   . Anxiety   . MDD (major depressive disorder)   . Wears contact lenses     Past Surgical History:  Procedure Laterality Date  . BREAST BIOPSY Left 2010   x2 per pt benign  . CESAREAN SECTION  X2  last one 02-28-2001   W/  BILATERAL TUBAL LIGATION w/ last c/s  . COLONOSCOPY  11/01/2017  . HYSTEROSCOPY W/ ENDOMETRIAL ABLATION  2019  . LUMBAR LAMINECTOMY/DECOMPRESSION MICRODISCECTOMY  2013   L4  --- S1  . SHOULDER ARTHROSCOPY Right 08/28/2020   Procedure: ARTHROSCOPY SHOULDER lysis of adhesions and manipulation under anesthesia, extensive debridement;  Surgeon: Nicholes Stairs, MD;  Location: [facility];  Service: Orthopedics;  Laterality: Right;    There were no vitals filed for this visit.   Subjective Assessment - 12/10/20 0937    Subjective The patient does not have pain with overhead reaching unless she is holding something heavy.  Her main limitation is reaching to the back seat.  She does stretching every day and strengthening 3 times/week.    Pertinent History hx of R  shoulder issues treated here    Patient Stated Goals be pain free for the first time in a year and a half    Currently in Pain? No/denies              [facility] PT Assessment - 12/10/20 0939      Assessment   Medical Diagnosis Rt shoulder manipulation, debridement and bicep tendon debridement    Referring Provider (PT) Dr Lori King    Onset Date/Surgical Date 08/28/20    Hand Dominance Right    Next MD Visit PRN                         [facility] Adult PT Treatment/Exercise - 12/10/20 0947      Exercises   Exercises Shoulder;Elbow      Shoulder Exercises: Supine   Horizontal ABduction Right;AROM;10 reps    External Rotation AROM;PROM;Right;5 reps    External Rotation Limitations with passive overpressure    Other Supine Exercises neural gliding supine ulnar with palms together;  supine horizontal abduction with wrist extension/flexion      Shoulder Exercises: Standing   Horizontal ABduction AROM;Both;12 reps    External Rotation AROM;Strengthening;Right;5 reps    External Rotation Limitations with cues on scapular depression    Flexion Strengthening;Both;10 reps    Shoulder Flexion Weight (lbs)  2    Flexion Limitations with cues on technique-- added to HEP      Shoulder Exercises: ROM/Strengthening   UBE (Upper Arm Bike) L3 2 min forward 1 min back      Shoulder Exercises: Stretch   Corner Stretch Limitations door frame working on trunk rotating away from door frame to open anterior shoulder    Wall Stretch - Flexion 1 rep;20 seconds    Wall Stretch - ABduction 1 rep;20 seconds    Other Shoulder Stretches chest and bicep stretch      Manual Therapy   Manual Therapy Joint mobilization;Soft tissue mobilization;Myofascial release    Manual therapy comments supine and standing    Joint Mobilization grade II-III, inferior glide GH joint, grade II distraction to relax muscles; mobilization with movement in standing    Soft tissue mobilization STM R anterior  deltoid and bicipital groove    Myofascial Release pec release                  PT Education - 12/10/20 1236    Education Details moved exercises based on body position in medbridge; scapation added    Person(s) Educated Patient    Methods Explanation;Demonstration;Handout    Comprehension Verbalized understanding;Returned demonstration              PT Long Term Goals - 12/10/20 0941      PT LONG TERM GOAL #1   Title I with advanced HEP    Time 6    Period Weeks    Status Achieved    Target Date 12/17/20      PT LONG TERM GOAL #2   Title improve muscle strength of mid back and Rt shoulder abduction =/> 4+/5 to assist with return to normal activities    Time 6    Period Weeks    Status Partially Met      PT LONG TERM GOAL #3   Title IMprove FOTO to > or equal to 72%.    Baseline 68% FS    Time 6    Period Weeks    Status Partially Met      PT LONG TERM GOAL #4   Title The patient will be able to return to rowing for community exercise.    Baseline able to complete 6 min.    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  PT to continue with emphasis on horizontal abduction during functional tasks, end range ER, and strengthening.  Plan to renew next week for 4 more weeks to continue to progress exercise to tolerance.    PT Treatment/Interventions ADLs/Self Care Home Management;Cryotherapy;Electrical Stimulation;Iontophoresis 26m/ml Dexamethasone;Moist Heat;Ultrasound;Functional mobility training;Therapeutic activities;Therapeutic exercise;Balance training;Neuromuscular re-education;Patient/family education;Orthotic Fit/Training;Manual techniques;Passive range of motion;Dry needling;Vasopneumatic  Device;Taping;Joint Manipulations;Spinal Manipulations    PT Next Visit Plan DN/manual to Rt shoulder/pec. continue progressive Rt shoulder ROM/ strengthening.    PT Home Exercise Plan Access Code: [email or web address]   Consulted and Agree with Plan of Care Patient           Patient will benefit from skilled therapeutic intervention in order to  improve the following deficits and impairments:     Visit Diagnosis: Chronic right shoulder pain  Muscle weakness (generalized)  Other symptoms and signs involving the musculoskeletal system     Problem List There are no problems to display for this patient.   Murphy, [place] 12/10/2020, 12:51 PM  [facility] [place], [place], [address] Phone: [phone]   Fax:  [phone]  Name: Lori King MRN: [id number] Date of Birth: 05-May-1966

## 2020-12-17 ENCOUNTER — Encounter: Payer: Self-pay | Admitting: Physical Therapy

## 2020-12-17 ENCOUNTER — Ambulatory Visit: Payer: BC Managed Care – PPO | Admitting: Physical Therapy

## 2020-12-17 ENCOUNTER — Other Ambulatory Visit: Payer: Self-pay

## 2020-12-17 DIAGNOSIS — M6281 Muscle weakness (generalized): Secondary | ICD-10-CM | POA: Diagnosis not present

## 2020-12-17 DIAGNOSIS — R29898 Other symptoms and signs involving the musculoskeletal system: Secondary | ICD-10-CM

## 2020-12-17 DIAGNOSIS — G8929 Other chronic pain: Secondary | ICD-10-CM

## 2020-12-17 DIAGNOSIS — M25511 Pain in right shoulder: Secondary | ICD-10-CM

## 2020-12-17 NOTE — Therapy (Addendum)
[place], [place], [address] Phone: [phone]   Fax:  [phone]  Physical Therapy Treatment  Patient Details  Name: Lori King MRN: [id number] Date of Birth: [birth year] Referring Provider (PT): Dr Victorino December   Encounter Date: 12/17/2020   PT End of Session - 12/17/20 1449    Visit Number 14    Number of Visits 18    Date for PT Re-Evaluation 12/17/20    Authorization Type BCBS    PT Start Time 1347    PT Stop Time 1430    PT Time Calculation (min) 43 min    Activity Tolerance Patient tolerated treatment well    Behavior During Therapy [facility] for tasks assessed/performed           Past Medical History:  Diagnosis Date  . Adhesive capsulitis of right shoulder   . Allergic rhinitis, seasonal   . Anemia   . Anxiety   . MDD (major depressive disorder)   . Wears contact lenses     Past Surgical History:  Procedure Laterality Date  . BREAST BIOPSY Left 2010   x2 per pt benign  . CESAREAN SECTION  X2  last one 02-28-2001   W/  BILATERAL TUBAL LIGATION w/ last c/s  . COLONOSCOPY  11/01/2017  . HYSTEROSCOPY W/ ENDOMETRIAL ABLATION  2019  . LUMBAR LAMINECTOMY/DECOMPRESSION MICRODISCECTOMY  2013   L4  --- S1  . SHOULDER ARTHROSCOPY Right 08/28/2020   Procedure: ARTHROSCOPY SHOULDER lysis of adhesions and manipulation under anesthesia, extensive debridement;  Surgeon: Nicholes Stairs, MD;  Location: [facility];  Service: Orthopedics;  Laterality: Right;    There were no vitals filed for this visit.   Subjective Assessment - 12/17/20 1351    Subjective Pt reports her Rt shoulder is getting better.  She has stopped taking meloxicam at night, still taking gabapentin at at night.  She is still challenged with scaption with 3# and snow angels. She is still unable to sleep on Rt side for extended time due to soreness. She has not tried rowing again.  She is able to  pick up 23# grandson with less pain and difficulty now.    Pertinent History hx of R shoulder issues treated here    Patient Stated Goals be pain free for the first time in a year and a half              [facility] PT Assessment - 12/17/20 0001      Assessment   Medical Diagnosis Rt shoulder manipulation, debridement and bicep tendon debridement    Referring Provider (PT) Dr Victorino December    Onset Date/Surgical Date 08/28/20    Hand Dominance Right    Next MD Visit PRN      AROM   Right Shoulder Extension 45 Degrees    Right Shoulder Flexion 160 Degrees      Strength   Right Shoulder Extension 5/5    Right Shoulder ABduction 4+/5    Right Shoulder Internal Rotation 5/5    Right Shoulder External Rotation 4+/5    Right Shoulder Horizontal ABduction 4+/5    Right Shoulder Horizontal ADduction 4+/5            [facility] Adult PT Treatment/Exercise - 12/17/20 0001      Shoulder Exercises: Standing   Horizontal ABduction Strengthening;Both;10 reps    Theraband Level (Shoulder Horizontal ABduction) Level 2 (Red)    External Rotation Both;10 reps;Strengthening  Theraband Level (Shoulder External Rotation) Level 2 (Red)   3 sec pause   ABduction Strengthening;Right;Left;10 reps    Shoulder ABduction Weight (lbs) 4,3   2nd set with 3#   Diagonals Strengthening;Right;10 reps    Theraband Level (Shoulder Diagonals) Level 2 (Red)    Other Standing Exercises 5-10 reps of both nerve glides with RUE per HEP.      Shoulder Exercises: ROM/Strengthening   UBE (Upper Arm Bike) L3: 2 min forward 1 min back      Shoulder Exercises: Stretch   Wall Stretch - Flexion 1 rep;20 seconds    Other Shoulder Stretches open book withRt rotation, hand behind head x 5 reps of 10 sec hold, then 5 reps with arms straight holding green band, and 3 arm straight, no resistance. .    Other Shoulder Stretches midlevel unilateral doorwway stretch x 15 sec x 3 reps, 2 reps with Rt arm higher x 15 sec.  repeated  after manual therapy.      Manual Therapy   Manual Therapy Joint mobilization;Soft tissue mobilization;Myofascial release;Passive ROM    Joint Mobilization grade II-III, inferior glide GH joint, grade II distraction to relax muscles    Soft tissue mobilization STM to Rt pec, deltoid, prox biceps brachii    Myofascial Release Rt pec release    Passive ROM Rt shoulder ER, flexion, scaption          HEP: deleted 2 stretches.  Pt verbalized understanding.    PT Long Term Goals - 12/17/20 1457      PT LONG TERM GOAL #1   Title I with advanced HEP    Time 6    Period Weeks    Status Achieved      PT LONG TERM GOAL #2   Title improve muscle strength of mid back and Rt shoulder abduction =/> 4+/5 to assist with return to normal activities    Time 6    Period Weeks    Status Partially Met      PT LONG TERM GOAL #3   Title IMprove FOTO to > or equal to 72%.    Baseline 68% FS    Time 6    Period Weeks    Status Partially Met      PT LONG TERM GOAL #4   Title The patient will be able to return to rowing for community exercise.    Baseline able to complete 6 min.    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 Reviewed current HEP, and pt performed all resisted shoulder exercises with standing with good tolerance.  Rt pec/ ant shoulder remains tight; decreased Rt shoulder ext AROM.  Pt making good progress towards remaining goals and will benefit from further PT intervention to maximize functional mobility.    Rehab Potential Good    PT Frequency 1x / week    PT Treatment/Interventions ADLs/Self Care Home Management;Cryotherapy;Electrical Stimulation;Iontophoresis 73m/ml Dexamethasone;Moist  Heat;Ultrasound;Functional mobility training;Therapeutic activities;Therapeutic exercise;Balance training;Neuromuscular re-education;Patient/family education;Orthotic Fit/Training;Manual techniques;Passive range of motion;Dry needling;Vasopneumatic Device;Taping;Joint Manipulations;Spinal Manipulations    PT Next Visit Plan DN/manual to  Rt shoulder/pec. continue progressive Rt shoulder ROM/ strengthening.    PT Home Exercise Plan Access Code: [email or web address]    Consulted and Agree with Plan of Care Patient           Patient will benefit from skilled therapeutic intervention in order to improve the following deficits and impairments:  Decreased range of motion,Decreased coordination,Increased fascial restricitons,Increased muscle spasms,Impaired UE functional use,Pain,Hypomobility,Impaired flexibility,Improper body mechanics,Decreased mobility,Decreased strength,Postural dysfunction  Visit Diagnosis: Chronic right shoulder pain  Muscle weakness (generalized)  Other symptoms and signs involving the musculoskeletal system     Problem List There are no problems to display for this patient.  Kerin Perna, PTA 12/17/20 4:01 PM  [facility] [place], [place], [address] Phone: [phone]   Fax:  [phone]  Name: BRONWEN PENDERGRAFT MRN: [id number] Date of Birth: 1965/09/26

## 2020-12-24 ENCOUNTER — Other Ambulatory Visit: Payer: Self-pay

## 2020-12-24 ENCOUNTER — Ambulatory Visit (INDEPENDENT_AMBULATORY_CARE_PROVIDER_SITE_OTHER): Payer: BC Managed Care – PPO | Admitting: Rehabilitative and Restorative Service Providers"

## 2020-12-24 DIAGNOSIS — M25521 Pain in right elbow: Secondary | ICD-10-CM

## 2020-12-24 DIAGNOSIS — M25511 Pain in right shoulder: Secondary | ICD-10-CM

## 2020-12-24 DIAGNOSIS — G8929 Other chronic pain: Secondary | ICD-10-CM

## 2020-12-24 DIAGNOSIS — R29898 Other symptoms and signs involving the musculoskeletal system: Secondary | ICD-10-CM

## 2020-12-24 DIAGNOSIS — M6281 Muscle weakness (generalized): Secondary | ICD-10-CM

## 2020-12-24 NOTE — Patient Instructions (Signed)
Access Code: [email or web address] URL: [email or web address] Date: 12/24/2020 Prepared by: Rudell Cobb  Exercises Child's Pose with Sidebending - 1 x daily - 7 x weekly - 1 sets - 2 reps - 30 seconds hold [place] on Foam Roll - 1 x daily - 2-3 x weekly - 1 sets - 10 reps Supine Chest Stretch with Elbows Bent - 1 x daily - 7 x weekly - 1 sets - 2 reps - 30 hold Prone Shoulder Flexion - 2 x daily - 7 x weekly - 1 sets - 10 reps Prone on Elbows Thoracic Rotation - 2 x daily - 7 x weekly - 1 sets - 10 reps Seated Cervical Sidebending Stretch - 1 x daily - 7 x weekly - 1 sets - 2 reps - 20 hold Standing Overhead Triceps Stretch - 1 x daily - 7 x weekly - 1 sets - 2 reps - 20 seconds hold Standing Single Arm Shoulder External Rotation in Abduction with Anchored Resistance - 2 x daily - 7 x weekly - 1 sets - 10 reps Standing Shoulder Single Arm PNF D2 Flexion with Resistance - 1 x daily - 3 x weekly - 2-3 sets - 10 reps Corner Pec Major Stretch - 1 x daily - 7 x weekly - 1 sets - 2-3 reps - 15-30 hold Wrist Extension Stretch at Wall - 1 x daily - 7 x weekly - 2-3 reps - 30 seconds hold Chest and Bicep Stretch - Arms Behind Back - 1 x daily - 7 x weekly - 1 sets - 3 reps - 15-30 seconds hold Scaption with Dumbbells - 2 x daily - 3 x weekly - 1 sets - 10 reps Ulnar Nerve Flossing - 2 x daily - 7 x weekly - 1 sets - 10 reps Ulnar Nerve Flossing - 2 x daily - 7 x weekly - 1 sets - 10 reps

## 2020-12-24 NOTE — Therapy (Signed)
[place], [place], [address] Phone: [phone]   Fax:  [phone]  Physical Therapy Treatment and Renewal Summary  Patient Details  Name: Lori King MRN: [id number] Date of Birth: 24-Nov-1965 Referring Provider (PT): Dr Victorino December   Encounter Date: 12/24/2020   PT End of Session - 12/24/20 1226    Visit Number 15    Number of Visits 18    Date for PT Re-Evaluation 12/17/20    Authorization Type BCBS    Authorization - Visit Number --    Authorization - Number of Visits --    PT Start Time 1105    PT Stop Time 1150    PT Time Calculation (min) 45 min    Activity Tolerance Patient tolerated treatment well    Behavior During Therapy [facility] for tasks assessed/performed           Past Medical History:  Diagnosis Date  . Adhesive capsulitis of right shoulder   . Allergic rhinitis, seasonal   . Anemia   . Anxiety   . MDD (major depressive disorder)   . Wears contact lenses     Past Surgical History:  Procedure Laterality Date  . BREAST BIOPSY Left 2010   x2 per pt benign  . CESAREAN SECTION  X2  last one 02-28-2001   W/  BILATERAL TUBAL LIGATION w/ last c/s  . COLONOSCOPY  11/01/2017  . HYSTEROSCOPY W/ ENDOMETRIAL ABLATION  2019  . LUMBAR LAMINECTOMY/DECOMPRESSION MICRODISCECTOMY  2013   L4  --- S1  . SHOULDER ARTHROSCOPY Right 08/28/2020   Procedure: ARTHROSCOPY SHOULDER lysis of adhesions and manipulation under anesthesia, extensive debridement;  Surgeon: Nicholes Stairs, MD;  Location: [facility]  Service: Orthopedics;  Laterality: Right;    There were no vitals filed for this visit.   Subjective Assessment - 12/24/20 1108    Subjective The patient tried the rowing machine.  She had no issues during exercise x 12 minutes.  She got nerve pain in the elbow- to the pinkie x 2 days.    Pertinent History hx of R shoulder issues treated here    Patient  Stated Goals be pain free for the first time in a year and a half    Currently in Pain? No/denies              [facility] PT Assessment - 12/24/20 1111      Assessment   Medical Diagnosis Rt shoulder manipulation, debridement and bicep tendon debridement    Referring Provider (PT) Dr Victorino December    Onset Date/Surgical Date 08/28/20    Hand Dominance Right                         [facility] Adult PT Treatment/Exercise - 12/24/20 1111      Exercises   Exercises Shoulder;Elbow      Shoulder Exercises: Supine   Other Supine Exercises propping on elbows with lateral weight shifting    Other Supine Exercises long sitting propping on extended arms with lateral weight shifting      Shoulder Exercises: Prone   Flexion Strengthening;Both;12 reps   2 sets   Horizontal ABduction 1 Strengthening;Both;10 reps      Shoulder Exercises: Standing   Horizontal ABduction AROM;Both;12 reps    External Rotation Strengthening;Right;12 reps;Theraband   2 sets   Theraband Level (Shoulder External Rotation) Level 2 (Red)    External Rotation Limitations with arm abducted  to 90 degrees    Internal Rotation Right;Strengthening;10 reps   lift off IR   ABduction Strengthening;Right;Left;10 reps    Shoulder ABduction Weight (lbs) 3    ABduction Limitations scaption    Row Strengthening;Both;10 reps    Theraband Level (Shoulder Row) Level 3 (Green)    Other Standing Exercises ulnar nerve glide      Shoulder Exercises: ROM/Strengthening   UBE (Upper Arm Bike) L3 x 2 minutes forward/2 minutes backward      Shoulder Exercises: Stretch   Other Shoulder Stretches prone thoracic rotation    Other Shoulder Stretches spinal twist supine      Neck Exercises: Stretches   Neck Stretch 2 reps;20 seconds                  PT Education - 12/24/20 1226    Education Details HEP- modified slightly    Person(s) Educated Patient    Methods Explanation;Demonstration;Handout    Comprehension  Verbalized understanding;Returned demonstration             PT Long Term Goals - 12/17/20 1457      PT LONG TERM GOAL #1   Title I with advanced HEP    Time 6    Period Weeks    Status Achieved      PT LONG TERM GOAL #2   Title improve muscle strength of mid back and Rt shoulder abduction =/> 4+/5 to assist with return to normal activities    Time 6    Period Weeks    Status Partially Met      PT LONG TERM GOAL #3   Title IMprove FOTO to > or equal to 72%.    Baseline 68% FS    Time 6    Period Weeks    Status Partially Met      PT LONG TERM GOAL #4   Title The patient will be able to return to rowing for community exercise.    Baseline able to complete 6 min.    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    Time 6    Period Weeks    Target Date 02/04/21      PT LONG TERM GOAL #2   Title improve muscle strength of mid back and Rt shoulder abduction =/> 4+/5 to assist with return to normal activities    Time 6    Period Weeks    Status Revised    Target Date 02/04/21      PT LONG TERM GOAL #3   Title IMprove FOTO to > or equal to 72%.    Baseline 68% FS    Time 6    Period Weeks    Status Revised    Target Date 02/04/21      PT LONG TERM GOAL #4   Title The patient will be able to return to rowing for community exercise.    Baseline completes 12 min of rowing with nerve pain x 2 days post exercise    Time 6  Period Weeks    Status Revised    Target Date 02/04/21                Plan - 12/24/20 1230    Clinical Impression Statement Long term goals re-assessed last visit and updated today.  The patient has full A/ROM.  She has some continued deficits in  strength for return to prior functional status and some R medial elbow pain that can radiate into 5th digit.  PT continues to work on end range motion, overhead strengthening, return to recreational tasks, return to prior exercise routine, and neural glides.    PT Frequency 1x / week    PT Duration 4 weeks    PT Treatment/Interventions ADLs/Self Care Home Management;Cryotherapy;Electrical Stimulation;Iontophoresis 74m/ml Dexamethasone;Moist Heat;Ultrasound;Functional mobility training;Therapeutic activities;Therapeutic exercise;Balance training;Neuromuscular re-education;Patient/family education;Orthotic Fit/Training;Manual techniques;Passive range of motion;Dry needling;Vasopneumatic Device;Taping;Joint Manipulations;Spinal Manipulations    PT Next Visit Plan thoracic rotation/mobilization, continue strengthening R shoulder    PT Home Exercise Plan Access Code: [email or web address]   Consulted and Agree with Plan of Care Patient           Patient will benefit from skilled therapeutic intervention in order to improve the following deficits and impairments:  Decreased range of motion,Decreased coordination,Increased fascial restricitons,Increased muscle spasms,Impaired UE functional use,Pain,Hypomobility,Impaired flexibility,Improper body mechanics,Decreased mobility,Decreased strength,Postural dysfunction  Visit Diagnosis: Chronic right shoulder pain  Muscle weakness (generalized)  Other symptoms and signs involving the musculoskeletal system  Pain in right elbow     Problem List There are no problems to display for this patient.   WRussell PMullins4/27/2022, 1:00 PM  [facility] [place] [address]Phone: [phone]  Fax:  [phone] Name: Lori LOPEMRN: [id number] of Birth: [birth year]

## 2021-01-07 ENCOUNTER — Ambulatory Visit: Payer: BC Managed Care – PPO | Admitting: Rehabilitative and Restorative Service Providers"

## 2021-01-07 ENCOUNTER — Other Ambulatory Visit: Payer: Self-pay

## 2021-01-07 DIAGNOSIS — R29898 Other symptoms and signs involving the musculoskeletal system: Secondary | ICD-10-CM | POA: Diagnosis not present

## 2021-01-07 DIAGNOSIS — M6281 Muscle weakness (generalized): Secondary | ICD-10-CM

## 2021-01-07 DIAGNOSIS — M25511 Pain in right shoulder: Secondary | ICD-10-CM | POA: Diagnosis not present

## 2021-01-07 DIAGNOSIS — G8929 Other chronic pain: Secondary | ICD-10-CM | POA: Diagnosis not present

## 2021-01-07 NOTE — Therapy (Signed)
[place], [place], [address] Phone: [phone]   Fax:  [phone]  Physical Therapy Treatment  Patient Details  Name: Lori King MRN: [id number] Date of Birth: 1965/10/13 Referring Provider (PT): Dr Victorino December   Encounter Date: 01/07/2021   PT End of Session - 01/07/21 1110    Visit Number 16    Number of Visits 18    Date for PT Re-Evaluation 02/04/21    Authorization Type BCBS    PT Start Time 1102    PT Stop Time 1150    PT Time Calculation (min) 48 min    Activity Tolerance Patient tolerated treatment well    Behavior During Therapy [facility] for tasks assessed/performed           Past Medical History:  Diagnosis Date  . Adhesive capsulitis of right shoulder   . Allergic rhinitis, seasonal   . Anemia   . Anxiety   . MDD (major depressive disorder)   . Wears contact lenses     Past Surgical History:  Procedure Laterality Date  . BREAST BIOPSY Left 2010   x2 per pt benign  . CESAREAN SECTION  X2  last one 02-28-2001   W/  BILATERAL TUBAL LIGATION w/ last c/s  . COLONOSCOPY  11/01/2017  . HYSTEROSCOPY W/ ENDOMETRIAL ABLATION  2019  . LUMBAR LAMINECTOMY/DECOMPRESSION MICRODISCECTOMY  2013   L4  --- S1  . SHOULDER ARTHROSCOPY Right 08/28/2020   Procedure: ARTHROSCOPY SHOULDER lysis of adhesions and manipulation under anesthesia, extensive debridement;  Surgeon: Nicholes Stairs, MD;  Location: [facility];  Service: Orthopedics;  Laterality: Right;    There were no vitals filed for this visit.   Subjective Assessment - 01/07/21 1106    Subjective The patient reports that swimming is going well.  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  She gets intermittent catching when overhead in abducted position in deltoid and tightness in parascapular musculature.    Pertinent History hx of R shoulder issues treated here    Patient Stated Goals  be pain free for the first time in a year and a half    Currently in Pain? No/denies              [facility] PT Assessment - 01/07/21 1112      Assessment   Medical Diagnosis Rt shoulder manipulation, debridement and bicep tendon debridement    Referring Provider (PT) Dr Victorino December    Onset Date/Surgical Date 08/28/20    Hand Dominance Right                         [facility] Adult PT Treatment/Exercise - 01/07/21 1112      Self-Care   Self-Care Other Self-Care Comments    Other Self-Care Comments  soft tissue mob with theracane      Exercises   Exercises Shoulder;Elbow      Shoulder Exercises: Stretch   Other Shoulder Stretches sidelying R upper trap stretch, standing latissimus dorsi stretch at doorframe, supine shoulder extension stretch with overpressure      Manual Therapy   Manual Therapy Soft tissue mobilization;Joint mobilization;Myofascial release    Manual therapy comments to reduce tightness in proximal arm    Joint Mobilization grade II-III inferior glide GH joint    Soft tissue mobilization STM and IASTM to pec, deltoids, biceps, triceps and lateral parascapular musculature    Myofascial Release  pec R side                     PT Long Term Goals - 12/24/20 1300      PT LONG TERM GOAL #1   Title The patient will be indep with HEP progression.    Time 6    Period Weeks    Target Date 02/04/21      PT LONG TERM GOAL #2   Title improve muscle strength of mid back and Rt shoulder abduction =/> 4+/5 to assist with return to normal activities    Time 6    Period Weeks    Status Revised    Target Date 02/04/21      PT LONG TERM GOAL #3   Title IMprove FOTO to > or equal to 72%.    Baseline 68% FS    Time 6    Period Weeks    Status Revised    Target Date 02/04/21      PT LONG TERM GOAL #4   Title The patient will be able to return to rowing for community exercise.    Baseline completes 12 min of rowing with nerve pain x 2 days post  exercise    Time 6    Period Weeks    Status Revised    Target Date 02/04/21                 Plan - 01/07/21 1220    Clinical Impression Statement The patient is using arm functionally without difficulty except for occasional "catching" sensation near deltoid tubercle.  She has soft tissue tightness in biceps, anterior/middle deltoid, upper trap, R pec, and parascapular musculature.  We discussed self care ways to mobilize tissue with theracane and STM to lengthen soft tissue.  Check in in 2 weeks planning for d/c with patient to try rowing between now and then.    PT Treatment/Interventions ADLs/Self Care Home Management;Cryotherapy;Electrical Stimulation;Iontophoresis 4mg /ml Dexamethasone;Moist Heat;Ultrasound;Functional mobility training;Therapeutic activities;Therapeutic exercise;Balance training;Neuromuscular re-education;Patient/family education;Orthotic Fit/Training;Manual techniques;Passive range of motion;Dry needling;Vasopneumatic Device;Taping;Joint Manipulations;Spinal Manipulations    PT Next Visit Plan how was rowing?  STM R deltoid, thoracic rotation/mobilization, continue strengthening R shoulder    PT Home Exercise Plan Access Code: [email or web address]    Consulted and Agree with Plan of Care Patient           Patient will benefit from skilled therapeutic intervention in order to improve the following deficits and impairments:     Visit Diagnosis: Chronic right shoulder pain  Muscle weakness (generalized)  Other symptoms and signs involving the musculoskeletal system     Problem List There are no problems to display for this patient.   [place], [place] 01/07/2021, 12:23 PM  [facility] [place], [place], [address] Phone: [phone]   Fax:  [phone]  Name: Lori King MRN: [id number] Date of Birth: 1965/09/16

## 2021-01-21 ENCOUNTER — Ambulatory Visit (INDEPENDENT_AMBULATORY_CARE_PROVIDER_SITE_OTHER): Payer: BC Managed Care – PPO | Admitting: Rehabilitative and Restorative Service Providers"

## 2021-01-21 ENCOUNTER — Other Ambulatory Visit: Payer: Self-pay

## 2021-01-21 DIAGNOSIS — R29898 Other symptoms and signs involving the musculoskeletal system: Secondary | ICD-10-CM

## 2021-01-21 DIAGNOSIS — G8929 Other chronic pain: Secondary | ICD-10-CM | POA: Diagnosis not present

## 2021-01-21 DIAGNOSIS — M6281 Muscle weakness (generalized): Secondary | ICD-10-CM | POA: Diagnosis not present

## 2021-01-21 DIAGNOSIS — M25511 Pain in right shoulder: Secondary | ICD-10-CM

## 2021-01-21 NOTE — Patient Instructions (Signed)
Access Code: [email or web address] URL: [email or web address] Date: 01/21/2021 Prepared by: Rudell Cobb  Exercises Child's Pose with Sidebending - 1 x daily - 7 x weekly - 1 sets - 2 reps - 30 seconds hold Prone Shoulder Flexion - 2 x daily - 7 x weekly - 1 sets - 10 reps Sidelying Open Book Thoracic Lumbar Rotation and Extension - 2 x daily - 7 x weekly - 1 sets - 3 reps - 30 seconds hold [place] on Foam Roll - 1 x daily - 2-3 x weekly - 1 sets - 10 reps Supine Chest Stretch with Elbows Bent - 1 x daily - 7 x weekly - 1 sets - 2 reps - 30 hold Seated Cervical Sidebending Stretch - 1 x daily - 7 x weekly - 1 sets - 2 reps - 20 hold Seated Shoulder External Rotation in Abduction Supported with Dumbbell - 2 x daily - 7 x weekly - 1 sets - 10 reps Standing Overhead Triceps Stretch - 1 x daily - 7 x weekly - 1 sets - 2 reps - 20 seconds hold Standing Shoulder Single Arm PNF D2 Flexion with Resistance - 1 x daily - 3 x weekly - 2-3 sets - 10 reps Corner Pec Major Stretch - 1 x daily - 7 x weekly - 1 sets - 2-3 reps - 15-30 hold Chest and Bicep Stretch - Arms Behind Back - 1 x daily - 7 x weekly - 1 sets - 3 reps - 15-30 seconds hold Scaption with Dumbbells - 2 x daily - 3 x weekly - 1 sets - 10 reps

## 2021-01-21 NOTE — Therapy (Addendum)
[place] [address] [place], [place], [address] Phone: [phone]   Fax:  [phone]  Physical Therapy Treatment/Discharge Summary  Patient Details  Name: Lori King MRN: [id number] Date of Birth: 1966-06-17 Referring Provider (PT): Dr Victorino December  PHYSICAL THERAPY DISCHARGE SUMMARY  Visits from Start of Care: 17  Current functional level related to goals / functional outcomes: See HEP   Remaining deficits: See patient status below.   Education / Equipment: HEP   Patient agrees to discharge. Patient goals were partially met. Patient is being discharged due to being pleased with the current functional level.  Encounter Date: 01/21/2021   PT End of Session - 01/21/21 1025     Visit Number 17    Number of Visits 18    Date for PT Re-Evaluation 02/04/21    Authorization Type [employer]    Authorization - Visit Number --    Authorization - Number of Visits --    PT Start Time [phone]    PT Stop Time 1022    PT Time Calculation (min) 43 min    Activity Tolerance Patient tolerated treatment well    Behavior During Therapy WFL for tasks assessed/performed             Past Medical History:  Diagnosis Date   Adhesive capsulitis of right shoulder    Allergic rhinitis, seasonal    Anemia    Anxiety    MDD (major depressive disorder)    Wears contact lenses     Past Surgical History:  Procedure Laterality Date   BREAST BIOPSY Left 2010   x2 per pt benign   CESAREAN SECTION  X2  last one 02-28-2001   W/  BILATERAL TUBAL LIGATION w/ last c/s   COLONOSCOPY  11/01/2017   HYSTEROSCOPY W/ ENDOMETRIAL ABLATION  2019   LUMBAR LAMINECTOMY/DECOMPRESSION MICRODISCECTOMY  2013   L4  --- S1   SHOULDER ARTHROSCOPY Right 08/28/2020   Procedure: ARTHROSCOPY SHOULDER lysis of adhesions and manipulation under anesthesia, extensive debridement;  Surgeon: Nicholes Stairs, MD;  Location: [facility];  Service: Orthopedics;  Laterality: Right;    There were no vitals filed for this visit.   Subjective Assessment - 01/21/21 0944     Subjective The patient has tried rowing x 2 reps for 6 minutes with pain.  The patient notes some pain anteriorly and at deltoid tuberosity on Monday after cleaning on Sunday and riding on a boat.  She got a bug bite on Sunday in the L arm and had an allergic reaction-- she is on antibiotics.    Pertinent History hx of R shoulder issues treated here    Patient Stated Goals be pain free for the first time in a year and a half    Currently in Pain? No/denies                [facility] PT Assessment - 01/21/21 0946       Assessment   Medical Diagnosis Rt shoulder manipulation, debridement and bicep tendon debridement    Referring Provider (PT) Dr Victorino December    Onset Date/Surgical Date 08/28/20                           [facility] Adult PT Treatment/Exercise - 01/21/21 0947       Self-Care   Self-Care Other Self-Care Comments    Other Self-Care Comments  discussed progression of home activities  Exercises   Exercises Shoulder;Elbow      Shoulder Exercises: Prone   Other Prone Exercises prone with thoracic rotation/extension      Shoulder Exercises: Sidelying   Other Sidelying Exercises open book thoracic rotation      Shoulder Exercises: Standing   ABduction Strengthening;Right;10 reps    Shoulder ABduction Weight (lbs) 3    ABduction Limitations scaption    Diagonals Strengthening;Right;5 reps    Diagonals Limitations in front of mirror to reduce trunk rotation      Shoulder Exercises: ROM/Strengthening   UBE (Upper Arm Bike) L3 x 1 minute forward, 1 minute backward x 2 repetitions      Shoulder Exercises: Stretch   Corner Stretch Limitations door frame bilateral pec stretch      Manual Therapy   Manual Therapy Soft tissue mobilization    Manual therapy comments to reduce tightness in proximal arm    Soft  tissue mobilization STM and IASTM to pec, deltoids, biceps, triceps and lateral parascapular musculature                    PT Education - 01/21/21 1025     Education Details HEP    Person(s) Educated Patient    Methods Explanation;Demonstration;Handout    Comprehension Verbalized understanding;Returned demonstration              PT Short Term Goals - 09/25/20 1147       PT SHORT TERM GOAL #1   Title ------      PT SHORT TERM GOAL #2   Title ----      PT SHORT TERM GOAL #3   Title ------      PT SHORT TERM GOAL #4   Title ------               PT Long Term Goals - 01/21/21 1046       PT LONG TERM GOAL #1   Title The patient will be indep with HEP progression.    Time 6    Period Weeks    Status Achieved      PT LONG TERM GOAL #2   Title improve muscle strength of mid back and Rt shoulder abduction =/> 4+/5 to assist with return to normal activities    Baseline No formal MMT, but patinet is demonstrating strength training and functional lifting.    Time 6    Period Weeks    Status Partially Met      PT LONG TERM GOAL #3   Title IMprove FOTO to > or equal to 72%.    Baseline FOTO survey closed    Time 6    Period Weeks    Status Deferred      PT LONG TERM GOAL #4   Title The patient will be able to return to rowing for community exercise.    Baseline patient doing 6 minutes of rowing without pain.    Time 6    Period Weeks    Status Achieved                   Plan - 01/21/21 1048     Clinical Impression Statement The patient has partially met LTGs.  She is returning to swimming, strength training,and rowing.  PT encouraged continued work on HEP and f/u if needed with PT.  Will hold for 30 days and d/c.    PT Treatment/Interventions ADLs/Self Care Home Management;Cryotherapy;Electrical Stimulation;Iontophoresis 4mg /ml Dexamethasone;Moist Heat;Ultrasound;Functional mobility training;Therapeutic activities;Therapeutic  exercise;Balance training;Neuromuscular  re-education;Patient/family education;Orthotic Fit/Training;Manual techniques;Passive range of motion;Dry needling;Vasopneumatic Device;Taping;Joint Manipulations;Spinal Manipulations    PT Next Visit Plan on hold x 30 days for patient to work on HEP, d/c if no return.    PT Home Exercise Plan Access Code: [email or web address]    Consulted and Agree with Plan of Care Patient             Patient will benefit from skilled therapeutic intervention in order to improve the following deficits and impairments:     Visit Diagnosis: Chronic right shoulder pain  Muscle weakness (generalized)  Other symptoms and signs involving the musculoskeletal system     Problem List There are no problems to display for this patient.   [place], [place] 01/21/2021, 12:15 PM  [facility] [place], [place], [address] Phone: [phone]   Fax:  [phone]  Name: SHANAUTICA FORKER MRN: [id number] Date of Birth: 1965/09/01

## 2021-01-30 IMAGING — MG MM DIGITAL DIAGNOSTIC UNILAT*L* W/ TOMO W/ CAD
6 series · 6 of 18 positions shown · non-contrast
Comparison: Previous examinations, including the bilateral breast
MRI dated 08/10/2019.

CLINICAL DATA: Possible mass in the medial left breast and possible
mass in the lateral left breast in the craniocaudal projection of a
recent screening mammogram. Family history of breast cancer
including the patient's mother diagnosed at age 73 and sister
diagnosed at age 32.

EXAM:
DIGITAL DIAGNOSTIC LEFT MAMMOGRAM WITH CAD AND TOMO
ULTRASOUND LEFT BREAST

[L CC synth-2D (1 of 2)]
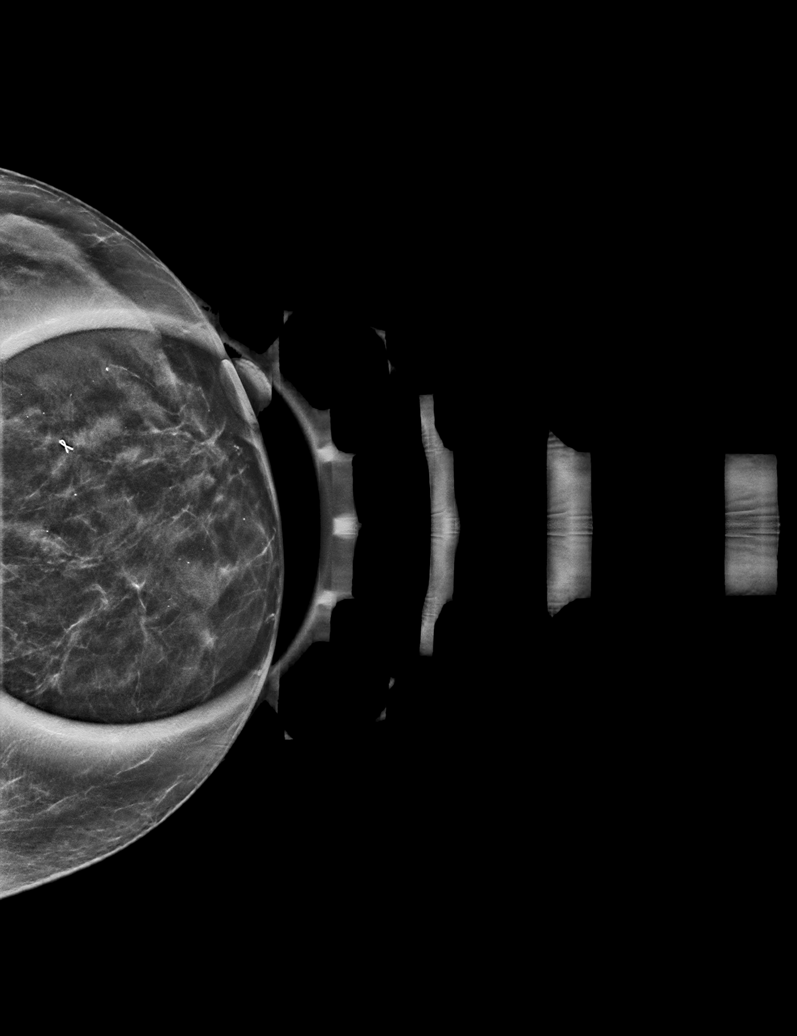

[L CC synth-2D (2 of 2)]
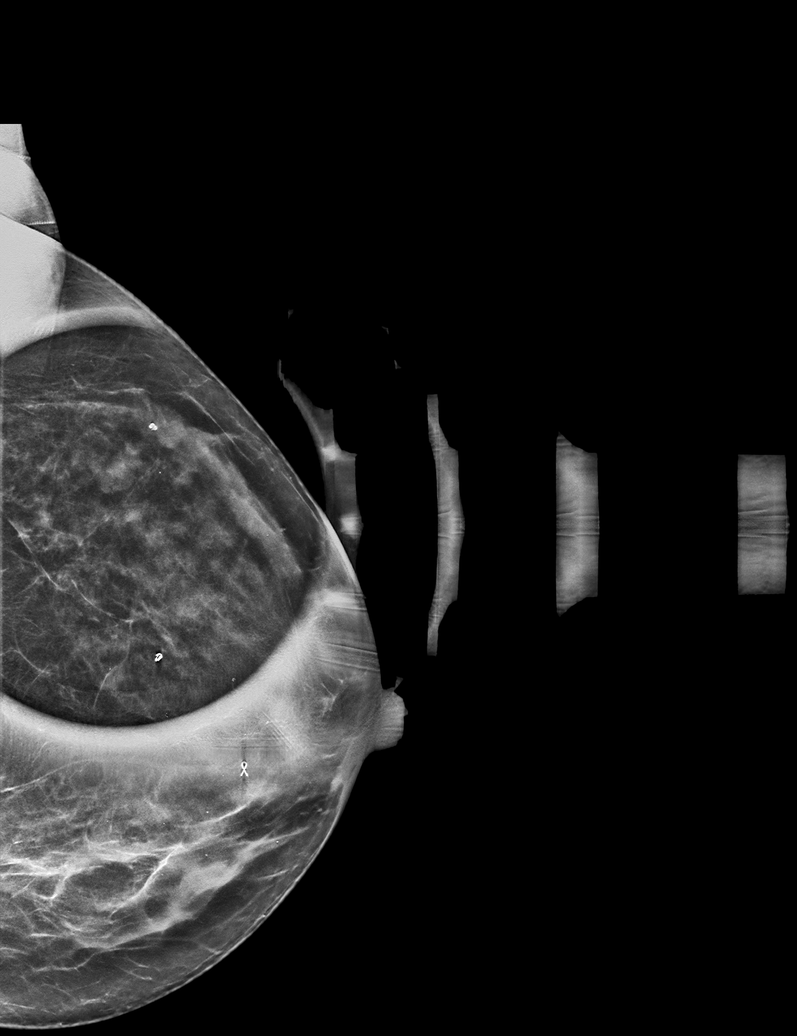

[L ML synth-2D]
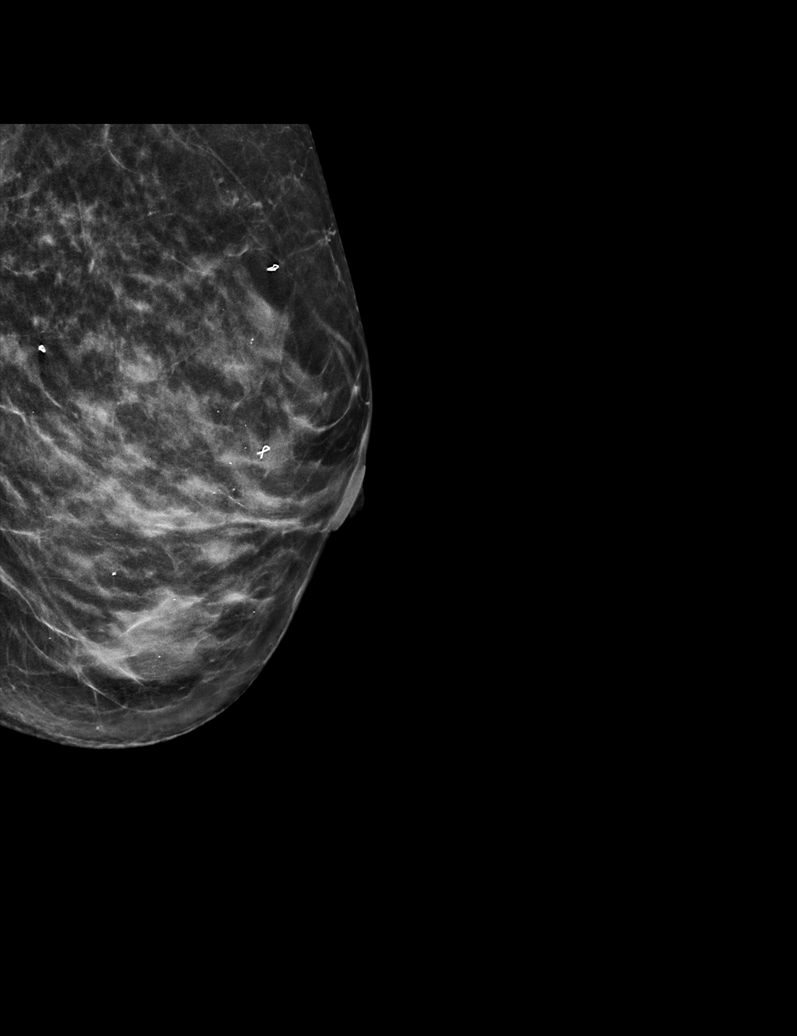

[L CC tomo (1 of 2) · tomo slice 27/52.0]
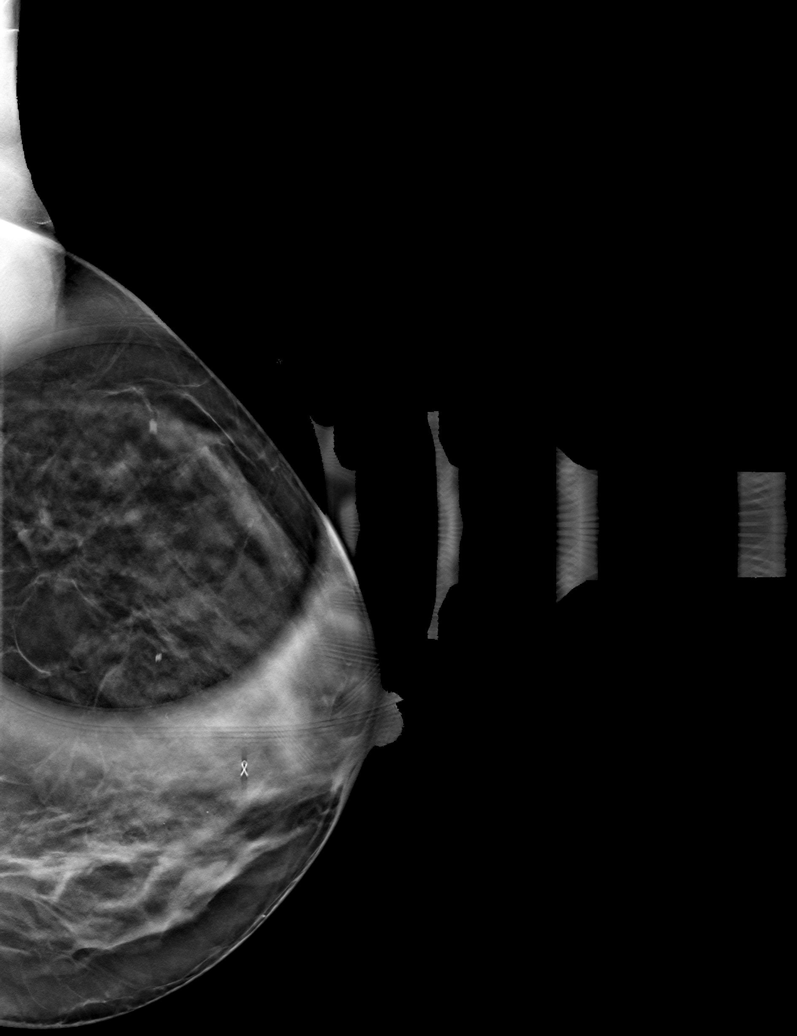

[L ML tomo · tomo slice 29/56.0]
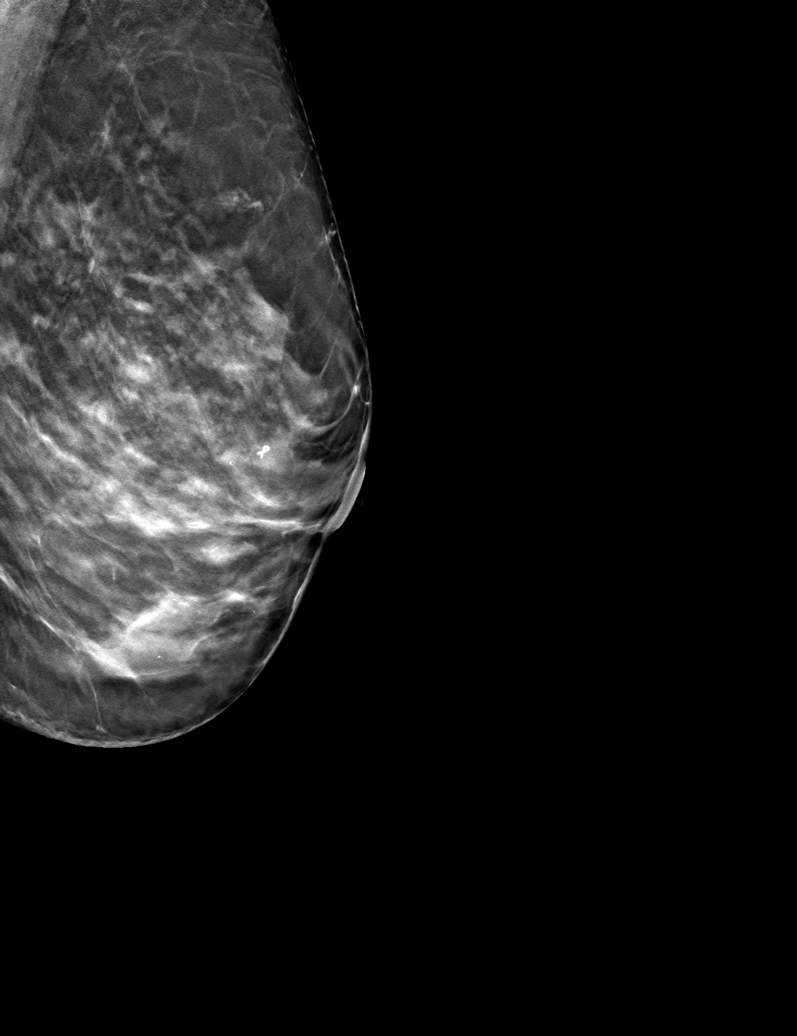

[L CC tomo (2 of 2) · tomo slice 23/44.0]
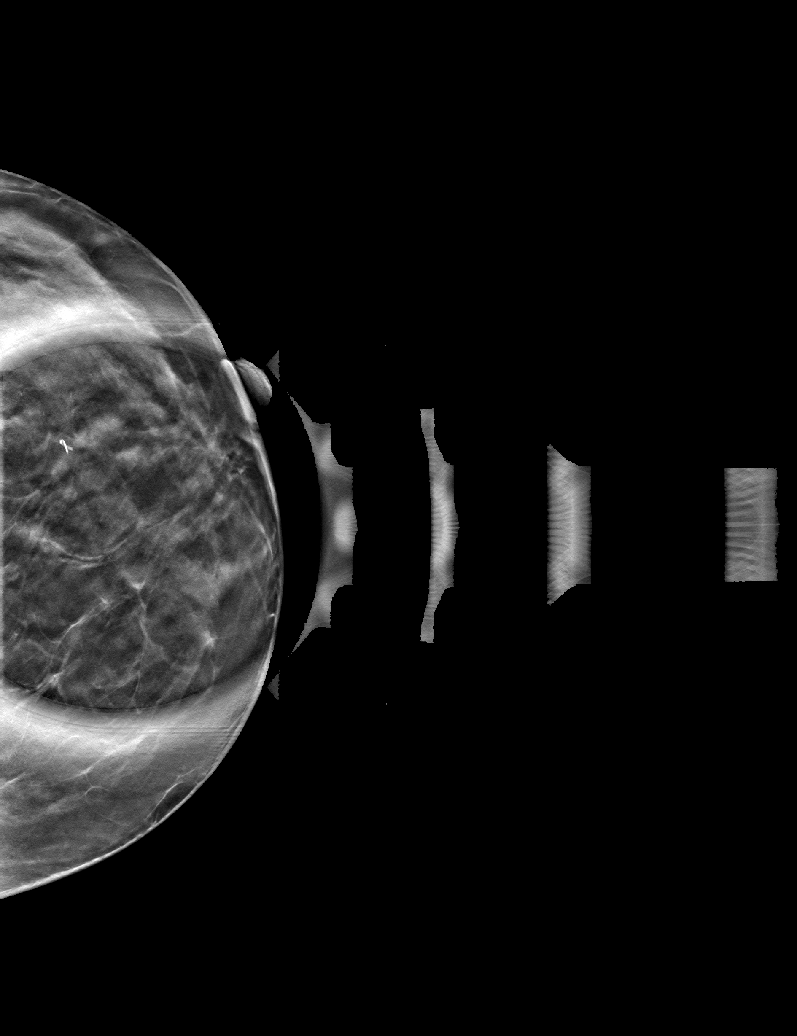

[6 of 18 positions shown; findings below may reference images not displayed]

ACR Breast Density Category c: The breast tissue is heterogeneously
dense, which may obscure small masses.
FINDINGS: 3D tomographic and 2D generated true lateral and spot compression
craniocaudal images of the left breast were obtained. These
demonstrate normal appearing fibroglandular tissue in the medial
left breast at the location of the recently suspected mass. There is
a 1 cm rounded, circumscribed mass in the posterior aspect of the
outer left breast. There were multiple similar sized cysts in that
region of the breast on the previous MRI.

Mammographic images were processed with CAD.

On physical exam, no mass is palpable in the outer left breast.

Targeted ultrasound is performed, showing a 9 mm cyst in the 2
o'clock position of the left breast, 6 cm from the nipple. This
corresponds to the mammographic mass. At real time, this was
circumscribed with minimal internal echoes and no internal blood
flow with power Doppler. This exhibits increased through
transmission of sound. There were nearby smaller cysts in that
region of the breast.
IMPRESSION: 1. Benign left breast cysts.
2. The recently suspected medial left breast mass was close
apposition of normal breast tissue.
3. No evidence of malignancy.

RECOMMENDATION:
1. Bilateral screening mammogram in 1 year.
2. Per American Cancer Society guidelines, if the patient has a
calculated lifetime risk of developing breast cancer of greater than
20%, annual screening MRI of the breasts would also be recommended.

I have discussed the findings and recommendations with the patient.
If applicable, a reminder letter will be sent to the patient
regarding the next appointment.

BI-RADS CATEGORY  2: Benign.

## 2021-02-09 ENCOUNTER — Ambulatory Visit
Admission: RE | Admit: 2021-02-09 | Discharge: 2021-02-09 | Disposition: A | Discharge status: Home / Self Care | Payer: BC Managed Care – PPO | Source: Ambulatory Visit | Attending: Obstetrics and Gynecology | Admitting: Obstetrics and Gynecology

## 2021-02-09 ENCOUNTER — Other Ambulatory Visit: Payer: Self-pay

## 2021-02-09 DIAGNOSIS — Z803 Family history of malignant neoplasm of breast: Secondary | ICD-10-CM

## 2021-02-09 MED ORDER — GADOBUTROL 1 MMOL/ML IV SOLN
7.0000 mL | Freq: Once | INTRAVENOUS | Status: AC | PRN
  Administered 2021-02-09: 7 mL via INTRAVENOUS

## 2021-04-07 ENCOUNTER — Emergency Department (HOSPITAL_BASED_OUTPATIENT_CLINIC_OR_DEPARTMENT_OTHER)
Admission: EM | Admit: 2021-04-07 | Discharge: 2021-04-08 | Disposition: A | Discharge status: Home / Self Care | Payer: BC Managed Care – PPO | Attending: Emergency Medicine | Admitting: Emergency Medicine

## 2021-04-07 ENCOUNTER — Other Ambulatory Visit: Payer: Self-pay

## 2021-04-07 ENCOUNTER — Emergency Department (HOSPITAL_COMMUNITY): Payer: BC Managed Care – PPO

## 2021-04-07 ENCOUNTER — Emergency Department (HOSPITAL_BASED_OUTPATIENT_CLINIC_OR_DEPARTMENT_OTHER): Payer: BC Managed Care – PPO

## 2021-04-07 ENCOUNTER — Encounter (HOSPITAL_BASED_OUTPATIENT_CLINIC_OR_DEPARTMENT_OTHER): Payer: Self-pay | Admitting: *Deleted

## 2021-04-07 DIAGNOSIS — Z79899 Other long term (current) drug therapy: Secondary | ICD-10-CM | POA: Insufficient documentation

## 2021-04-07 DIAGNOSIS — E876 Hypokalemia: Secondary | ICD-10-CM | POA: Insufficient documentation

## 2021-04-07 DIAGNOSIS — Y900 Blood alcohol level of less than 20 mg/100 ml: Secondary | ICD-10-CM | POA: Insufficient documentation

## 2021-04-07 DIAGNOSIS — R42 Dizziness and giddiness: Secondary | ICD-10-CM | POA: Insufficient documentation

## 2021-04-07 DIAGNOSIS — R200 Anesthesia of skin: Secondary | ICD-10-CM | POA: Insufficient documentation

## 2021-04-07 LAB — COMPREHENSIVE METABOLIC PANEL
ALT: 18 U/L (ref 0–44)
AST: 16 U/L (ref 15–41)
Albumin: 4.5 g/dL (ref 3.5–5.0)
Alkaline Phosphatase: 65 U/L (ref 38–126)
Anion gap: 10 (ref 5–15)
BUN: 14 mg/dL (ref 6–20)
CO2: 27 mmol/L (ref 22–32)
Calcium: 9.2 mg/dL (ref 8.9–10.3)
Chloride: 99 mmol/L (ref 98–111)
Creatinine, Ser: 0.81 mg/dL (ref 0.44–1.00)
GFR, Estimated: >60 mL/min (ref >60)
Glucose, Bld: 114 mg/dL — ABNORMAL HIGH (ref 70–99)
Potassium: 3.3 mmol/L — ABNORMAL LOW (ref 3.5–5.1)
Sodium: 136 mmol/L (ref 135–145)
Total Bilirubin: 0.7 mg/dL (ref 0.3–1.2)
Total Protein: 7.8 g/dL (ref 6.5–8.1)

## 2021-04-07 LAB — DIFFERENTIAL
Abs Immature Granulocytes: 0.01 10*3/uL (ref 0.00–0.07)
Basophils Absolute: 0 10*3/uL (ref 0.0–0.1)
Basophils Relative: 1 %
Eosinophils Absolute: 0.2 10*3/uL (ref 0.0–0.5)
Eosinophils Relative: 3 %
Immature Granulocytes: 0 %
Lymphocytes Relative: 32 %
Lymphs Abs: 2.3 10*3/uL (ref 0.7–4.0)
Monocytes Absolute: 0.6 10*3/uL (ref 0.1–1.0)
Monocytes Relative: 8 %
Neutro Abs: 4.2 10*3/uL (ref 1.7–7.7)
Neutrophils Relative %: 56 %

## 2021-04-07 LAB — URINALYSIS, ROUTINE W REFLEX MICROSCOPIC
Bilirubin Urine: NEGATIVE
Glucose, UA: NEGATIVE mg/dL
Hgb urine dipstick: NEGATIVE
Ketones, ur: NEGATIVE mg/dL
Leukocytes,Ua: NEGATIVE
Nitrite: NEGATIVE
Protein, ur: NEGATIVE mg/dL
Specific Gravity, Urine: 1.004 — ABNORMAL LOW (ref 1.005–1.030)
pH: 7 (ref 5.0–8.0)

## 2021-04-07 LAB — CBC
HCT: 39.1 % (ref 36.0–46.0)
Hemoglobin: 12.9 g/dL (ref 12.0–15.0)
MCH: 29.1 pg (ref 26.0–34.0)
MCHC: 33 g/dL (ref 30.0–36.0)
MCV: 88.1 fL (ref 80.0–100.0)
Platelets: 290 10*3/uL (ref 150–400)
RBC: 4.44 MIL/uL (ref 3.87–5.11)
RDW: 12.9 % (ref 11.5–15.5)
WBC: 7.3 10*3/uL (ref 4.0–10.5)
nRBC: 0 % (ref 0.0–0.2)

## 2021-04-07 LAB — RAPID URINE DRUG SCREEN, HOSP PERFORMED
Amphetamines: NOT DETECTED
Barbiturates: NOT DETECTED
Benzodiazepines: NOT DETECTED
Cocaine: NOT DETECTED
Opiates: NOT DETECTED
Tetrahydrocannabinol: NOT DETECTED

## 2021-04-07 LAB — CBG MONITORING, ED: Glucose-Capillary: 90 mg/dL (ref 70–99)

## 2021-04-07 LAB — ETHANOL: Alcohol, Ethyl (B): <10 mg/dL (ref <10)

## 2021-04-07 LAB — PROTIME-INR
INR: 1 (ref 0.8–1.2)
Prothrombin Time: 13.4 seconds (ref 11.4–15.2)

## 2021-04-07 LAB — MAGNESIUM: Magnesium: 2.3 mg/dL (ref 1.7–2.4)

## 2021-04-07 LAB — APTT: aPTT: 26 seconds (ref 24–36)

## 2021-04-07 MED ORDER — SODIUM CHLORIDE 0.9 % IV BOLUS
500.0000 mL | Freq: Once | INTRAVENOUS | Status: AC
  Administered 2021-04-07: 500 mL via INTRAVENOUS

## 2021-04-07 MED ORDER — POTASSIUM CHLORIDE CRYS ER 20 MEQ PO TBCR
40.0000 meq | EXTENDED_RELEASE_TABLET | Freq: Once | ORAL | Status: AC
  Administered 2021-04-07: 40 meq via ORAL
  Filled 2021-04-07: qty 2

## 2021-04-07 MED ORDER — SODIUM CHLORIDE 0.9 % IV SOLN: 100.0000 mL/h | INTRAVENOUS | Status: DC

## 2021-04-07 MED ORDER — KETOROLAC TROMETHAMINE 30 MG/ML IJ SOLN
30.0000 mg | Freq: Once | INTRAMUSCULAR | Status: AC
Start: 2021-04-07 — End: 2021-04-07
  Administered 2021-04-07: 30 mg via INTRAVENOUS
  Filled 2021-04-07: qty 1

## 2021-04-07 MED ORDER — METOCLOPRAMIDE HCL 5 MG/ML IJ SOLN
10.0000 mg | Freq: Once | INTRAMUSCULAR | Status: AC
  Administered 2021-04-07: 10 mg via INTRAVENOUS
  Filled 2021-04-07: qty 2

## 2021-04-07 MED ORDER — DIPHENHYDRAMINE HCL 50 MG/ML IJ SOLN
25.0000 mg | Freq: Once | INTRAMUSCULAR | Status: AC
  Administered 2021-04-07: 25 mg via INTRAVENOUS
  Filled 2021-04-07: qty 1

## 2021-04-07 NOTE — ED Triage Notes (Signed)
30 minutes ago she was driving and had sudden onset of dizziness and feeling that her brain was shocked. Left hand is numb.

## 2021-04-07 NOTE — ED Provider Notes (Signed)
  Physical Exam  BP 125/86 (BP Location: Right Arm)   Pulse 76   Temp 98 F (36.7 C)   Resp 16   Ht 5' 2.5" (1.588 m)   Wt 63.5 kg   SpO2 100%   BMI 25.20 kg/m   Physical Exam  ED Course/Procedures     Procedures  MDM  Patient sent to the [facility] ED from [facility] for MRI to rule out CVA.  Study has been performed and is unremarkable.  Patient feels back to baseline and seemed to improve after receiving a migraine cocktail at [facility].    Patient informed of results and will be discharged to home.  She is to follow up with PCP.       Veryl Speak, MD 04/07/21 [phone]

## 2021-04-07 NOTE — Discharge Instructions (Addendum)
Follow-up with your primary doctor in the next 1 to 2 weeks, and return to the ER if symptoms significantly worsen or change.

## 2021-04-07 NOTE — ED Notes (Signed)
Pt is aware she needs a urine sample 

## 2021-04-07 NOTE — ED Notes (Signed)
ED Provider at bedside. 

## 2021-04-07 NOTE — ED Provider Notes (Signed)
[place] EMERGENCY DEPARTMENT Provider Note   CSN: [id number] Arrival date & time: 04/07/21  1840     History No chief complaint on file.   Lori King is a 55 y.o. female.  Pt presents to the ED today with dizziness and left arm numbness.  Pt said she was driving home and suddenly felt dizzy and then felt that her brain was zapped.  She said that did not last long, but now she has some left arm numbness.  Pt denies any weakness.  She denies visual disturbance or difficulty walking.  No hx of cva.      Past Medical History:  Diagnosis Date   Adhesive capsulitis of right shoulder    Allergic rhinitis, seasonal    Anemia    Anxiety    MDD (major depressive disorder)    Wears contact lenses     There are no problems to display for this patient.   Past Surgical History:  Procedure Laterality Date   BREAST BIOPSY Left 2010   x2 per pt benign   CESAREAN SECTION  X2  last one 02-28-2001   W/  BILATERAL TUBAL LIGATION w/ last c/s   COLONOSCOPY  11/01/2017   HYSTEROSCOPY W/ ENDOMETRIAL ABLATION  2019   LUMBAR LAMINECTOMY/DECOMPRESSION MICRODISCECTOMY  2013   L4  --- S1   SHOULDER ARTHROSCOPY Right 08/28/2020   Procedure: ARTHROSCOPY SHOULDER lysis of adhesions and manipulation under anesthesia, extensive debridement;  Surgeon: Nicholes Stairs, MD;  Location: [facility];  Service: Orthopedics;  Laterality: Right;     OB History   No obstetric history on file.     Family History  Problem Relation Age of Onset   Breast cancer Mother 60   Breast cancer Sister 50    Social History   Tobacco Use   Smoking status: Never   Smokeless tobacco: Never  Vaping Use   Vaping Use: Never used  Substance Use Topics   Alcohol use: Yes    Comment: OCCASIONAL   Drug use: Never    Home Medications Prior to Admission medications   Medication Sig Start Date End Date Taking? Authorizing Provider  buPROPion (WELLBUTRIN XL) 300 MG 24 hr  tablet Take 300 mg by mouth daily.   Yes [provider]  fluticasone (FLONASE) 50 MCG/ACT nasal spray Place into both nostrils daily.   Yes [provider]  acetaminophen (TYLENOL) 500 MG tablet Take 500 mg by mouth every 6 (six) hours as needed.    [provider]  EPINEPHrine (EPIPEN 2-PAK) 0.3 mg/0.3 mL IJ SOAJ injection Inject 0.3 mLs (0.3 mg total) into the muscle once as needed (for severe allergic reaction). CAll 911 immediately if you have to use this medicine 03/20/17   Larene Pickett, PA-C  loratadine (CLARITIN) 10 MG tablet Take 10 mg by mouth daily.    [provider]  LORazepam (ATIVAN) 0.5 MG tablet Take 0.5 mg by mouth every 6 (six) hours as needed for anxiety.    [provider]  meloxicam (MOBIC) 7.5 MG tablet Take 7.5 mg by mouth 2 (two) times daily.    [provider]  ondansetron (ZOFRAN ODT) 4 MG disintegrating tablet Take 1 tablet (4 mg total) by mouth every 8 (eight) hours as needed for nausea or vomiting. Patient not taking: Reported on 09/25/2020 08/28/20   Nicholes Stairs, MD  oxyCODONE (ROXICODONE) 5 MG immediate release tablet Take 1 tablet (5 mg total) by mouth every 4 (four) hours  as needed for moderate pain or severe pain. Patient not taking: Reported on 09/25/2020 08/28/20 08/28/21  Nicholes Stairs, MD    Allergies    Other, Shellfish allergy, Doxycycline, Ciprofloxacin, and Penicillins  Review of Systems   Review of Systems  Neurological:  Positive for numbness.  All other systems reviewed and are negative.  Physical Exam Updated Vital Signs BP 124/77 (BP Location: Right Arm)   Pulse 82   Temp 98 F (36.7 C) (Oral)   Resp 16   Ht 5' 2.5" (1.588 m)   Wt 63.5 kg   SpO2 100%   BMI 25.20 kg/m   Physical Exam Vitals and nursing note reviewed.  Constitutional:      Appearance: Normal appearance.  HENT:     Head: Normocephalic and atraumatic.     Right Ear: External ear normal.     Left  Ear: External ear normal.     Nose: Nose normal.     Mouth/Throat:     Mouth: Mucous membranes are moist.     Pharynx: Oropharynx is clear.  Eyes:     Extraocular Movements: Extraocular movements intact.     Conjunctiva/sclera: Conjunctivae normal.     Pupils: Pupils are equal, round, and reactive to light.  Cardiovascular:     Rate and Rhythm: Normal rate and regular rhythm.     Pulses: Normal pulses.     Heart sounds: Normal heart sounds.  Pulmonary:     Effort: Pulmonary effort is normal.     Breath sounds: Normal breath sounds.  Abdominal:     General: Abdomen is flat. Bowel sounds are normal.     Palpations: Abdomen is soft.  Musculoskeletal:        General: Normal range of motion.     Cervical back: Normal range of motion and neck supple.  Skin:    General: Skin is warm.     Capillary Refill: Capillary refill takes less than 2 seconds.  Neurological:     General: No focal deficit present.     Mental Status: She is alert and oriented to person, place, and time.  Psychiatric:        Mood and Affect: Mood normal.        Behavior: Behavior normal.        Thought Content: Thought content normal.        Judgment: Judgment normal.    ED Results / Procedures / Treatments   Labs (all labs ordered are listed, but only abnormal results are displayed) Labs Reviewed  COMPREHENSIVE METABOLIC PANEL - Abnormal; Notable for the following components:      Result Value   Potassium 3.3 (*)    Glucose, Bld 114 (*)    All other components within normal limits  ETHANOL  PROTIME-INR  APTT  CBC  DIFFERENTIAL  RAPID URINE DRUG SCREEN, HOSP PERFORMED  URINALYSIS, ROUTINE W REFLEX MICROSCOPIC  MAGNESIUM  CBG MONITORING, ED    EKG EKG Interpretation  Date/Time:  Tuesday April 07 2021 19:20:42 EDT Ventricular Rate:  77 PR Interval:  169 QRS Duration: 101 QT Interval:  412 QTC Calculation: 467 R Axis:   18 Text Interpretation: Sinus rhythm No old tracing to compare Confirmed  by Isla Pence [phone]) on 04/07/2021 7:41:58 PM  Radiology CT HEAD WO CONTRAST  Result Date: 04/07/2021 CLINICAL DATA:  Sudden onset of dizziness and left hand numbness. EXAM: CT HEAD WITHOUT CONTRAST TECHNIQUE: Contiguous axial images were obtained from the base of the skull through the vertex  without intravenous contrast. COMPARISON:  None. FINDINGS: Brain: No evidence of acute infarction, hemorrhage, hydrocephalus, extra-axial collection or mass lesion/mass effect. Vascular: No hyperdense vessel or unexpected calcification. Skull: Normal. Negative for fracture or focal lesion. Sinuses/Orbits: Visualized portions of the paranasal sinuses and mastoid air cells are predominantly clear. Orbits unremarkable. Other: None. IMPRESSION: No acute intracranial findings. Electronically Signed   By: Dahlia Bailiff MD   On: 04/07/2021 19:39    Procedures Procedures   Medications Ordered in ED Medications  sodium chloride 0.9 % bolus 500 mL (500 mLs Intravenous New Bag/Given 04/07/21 1935)    Followed by  0.9 %  sodium chloride infusion (has no administration in time range)  ketorolac (TORADOL) 30 MG/ML injection 30 mg (has no administration in time range)  metoCLOPramide (REGLAN) injection 10 mg (has no administration in time range)  diphenhydrAMINE (BENADRYL) injection 25 mg (has no administration in time range)  potassium chloride SA (KLOR-CON) CR tablet 40 mEq (has no administration in time range)    ED Course  I have reviewed the triage vital signs and the nursing notes.  Pertinent labs & imaging results that were available during my care of the patient were reviewed by me and considered in my medical decision making (see chart for details).    MDM Rules/Calculators/A&P                           Pt does not meet code stroke criteria as her only sx now is some mild left arm numbness.    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  However, her father had several strokes and she is worried she has  had one as well.  CT neg, but stroke is not ruled out.  K is slightly low, so this is replaced.  Mg level added on.    Pt has been accepted for transfer to [facility] ED by Dr. Laverta Baltimore.  MRI brain has been ordered.  Pt's husband will transfer her via POV.   Final Clinical Impression(s) / ED Diagnoses Final diagnoses:  Arm numbness left  Hypokalemia    Rx / DC Orders ED Discharge Orders     None        Isla Pence, MD 04/07/21 2018

## 2021-04-07 NOTE — ED Notes (Signed)
Patient transported to CT 

## 2021-04-07 NOTE — ED Notes (Signed)
Report called to charge RN at [facility] ED

## 2021-04-07 NOTE — ED Notes (Signed)
Patient states that she rather get results from doctor and wants to leave. IV taken out by this NT prior to knowing that she was placed in a room.

## 2021-07-09 ENCOUNTER — Other Ambulatory Visit: Payer: Self-pay | Admitting: Obstetrics and Gynecology

## 2021-07-09 DIAGNOSIS — Z1231 Encounter for screening mammogram for malignant neoplasm of breast: Secondary | ICD-10-CM

## 2021-09-07 ENCOUNTER — Ambulatory Visit
Admission: RE | Admit: 2021-09-07 | Discharge: 2021-09-07 | Disposition: A | Discharge status: Home / Self Care | Payer: BC Managed Care – PPO | Source: Ambulatory Visit | Attending: Obstetrics and Gynecology | Admitting: Obstetrics and Gynecology

## 2021-09-07 ENCOUNTER — Other Ambulatory Visit: Payer: Self-pay

## 2021-09-07 DIAGNOSIS — Z1231 Encounter for screening mammogram for malignant neoplasm of breast: Secondary | ICD-10-CM

## 2022-03-07 ENCOUNTER — Encounter (HOSPITAL_BASED_OUTPATIENT_CLINIC_OR_DEPARTMENT_OTHER): Payer: Self-pay | Admitting: Emergency Medicine

## 2022-03-07 ENCOUNTER — Emergency Department (HOSPITAL_BASED_OUTPATIENT_CLINIC_OR_DEPARTMENT_OTHER)
Admission: EM | Admit: 2022-03-07 | Discharge: 2022-03-07 | Disposition: A | Discharge status: Home / Self Care | Payer: BC Managed Care – PPO | Attending: Emergency Medicine | Admitting: Emergency Medicine

## 2022-03-07 ENCOUNTER — Other Ambulatory Visit: Payer: Self-pay

## 2022-03-07 DIAGNOSIS — H538 Other visual disturbances: Secondary | ICD-10-CM | POA: Diagnosis not present

## 2022-03-07 DIAGNOSIS — R42 Dizziness and giddiness: Secondary | ICD-10-CM

## 2022-03-07 DIAGNOSIS — R0602 Shortness of breath: Secondary | ICD-10-CM | POA: Insufficient documentation

## 2022-03-07 DIAGNOSIS — R002 Palpitations: Secondary | ICD-10-CM | POA: Diagnosis not present

## 2022-03-07 LAB — CBC
HCT: 37.2 % (ref 36.0–46.0)
Hemoglobin: 12.5 g/dL (ref 12.0–15.0)
MCH: 29.6 pg (ref 26.0–34.0)
MCHC: 33.6 g/dL (ref 30.0–36.0)
MCV: 88.2 fL (ref 80.0–100.0)
Platelets: 293 10*3/uL (ref 150–400)
RBC: 4.22 MIL/uL (ref 3.87–5.11)
RDW: 13.4 % (ref 11.5–15.5)
WBC: 7.1 10*3/uL (ref 4.0–10.5)
nRBC: 0 % (ref 0.0–0.2)

## 2022-03-07 LAB — TROPONIN I (HIGH SENSITIVITY): Troponin I (High Sensitivity): <2 ng/L (ref <18)

## 2022-03-07 LAB — BASIC METABOLIC PANEL
Anion gap: 7 (ref 5–15)
BUN: 19 mg/dL (ref 6–20)
CO2: 25 mmol/L (ref 22–32)
Calcium: 9.1 mg/dL (ref 8.9–10.3)
Chloride: 106 mmol/L (ref 98–111)
Creatinine, Ser: 0.74 mg/dL (ref 0.44–1.00)
GFR, Estimated: >60 mL/min (ref >60)
Glucose, Bld: 93 mg/dL (ref 70–99)
Potassium: 3.3 mmol/L — ABNORMAL LOW (ref 3.5–5.1)
Sodium: 138 mmol/L (ref 135–145)

## 2022-03-07 LAB — MAGNESIUM: Magnesium: 2.3 mg/dL (ref 1.7–2.4)

## 2022-03-07 NOTE — Discharge Instructions (Signed)
Follow-up with a cardiologist as previously scheduled, additionally follow-up with your primary care doctor.  Come back to ER if you have any recurrent episodes of feeling lightheaded, dizzy, passing out, chest pain, difficulty breathing or other new concerning symptom.

## 2022-03-07 NOTE — ED Provider Notes (Signed)
[facility] Provider Note   CSN: [id number] Arrival date & time: 03/07/22  1450     History  Chief Complaint  Patient presents with   Dizziness    Lori King is a 56 y.o. female.  Department due to concern for left feeling lightheaded, tunnel vision.  Patient states that while she was at church, sitting she felt somewhat lightheaded, felt sensation of tunnel vision.  She also felt like her heart was racing, palpitations.  Also felt somewhat short of breath.  She did not feel like she was going to pass out, did not pass out.  The symptoms slowly went away and now she has no ongoing symptoms.  She has had prior episodes like this previously.  Has discussed with her primary care doctor, had completed a ZIO monitor which captured a brief episode of SVT but no other significant heart arrhythmias.  Her primary care doctor has referred to cardiology but she has not been seen yet by the cardiologist.  She has not had any chest pain associated with the episode today.  She did eat breakfast.  Obtain additional history from review of her PCP visit on 5/30 through care everywhere.  She had brief episode of SVT on the ZIO monitor.  She was referred to cardiology. HPI     Home Medications Prior to Admission medications   Medication Sig Start Date End Date Taking? Authorizing Provider  acetaminophen (TYLENOL) 500 MG tablet Take 500 mg by mouth every 6 (six) hours as needed.    [provider]  buPROPion (WELLBUTRIN XL) 300 MG 24 hr tablet Take 300 mg by mouth daily.    [provider]  EPINEPHrine (EPIPEN 2-PAK) 0.3 mg/0.3 mL IJ SOAJ injection Inject 0.3 mLs (0.3 mg total) into the muscle once as needed (for severe allergic reaction). CAll 911 immediately if you have to use this medicine 03/20/17   Larene Pickett, PA-C  fluticasone [facility]) 50 MCG/ACT nasal spray Place into both nostrils daily.    [provider]  loratadine (CLARITIN) 10  MG tablet Take 10 mg by mouth daily.    [provider]  LORazepam (ATIVAN) 0.5 MG tablet Take 0.5 mg by mouth every 6 (six) hours as needed for anxiety.    [provider]  meloxicam (MOBIC) 7.5 MG tablet Take 7.5 mg by mouth 2 (two) times daily.    [provider]  ondansetron (ZOFRAN ODT) 4 MG disintegrating tablet Take 1 tablet (4 mg total) by mouth every 8 (eight) hours as needed for nausea or vomiting. Patient not taking: Reported on 09/25/2020 08/28/20   Nicholes Stairs, MD      Allergies    Other, Shellfish allergy, Doxycycline, Ciprofloxacin, and Penicillins    Review of Systems   Review of Systems  Physical Exam Updated Vital Signs BP 130/76   Pulse 68   Temp 98.2 F (36.8 C) (Oral)   Resp 19   Ht '5\' 3"'$  (1.6 m)   Wt 60.3 kg   SpO2 100%   BMI 23.56 kg/m  Physical Exam  ED Results / Procedures / Treatments   Labs (all labs ordered are listed, but only abnormal results are displayed) Labs Reviewed  BASIC METABOLIC PANEL - Abnormal; Notable for the following components:      Result Value   Potassium 3.3 (*)    All other components within normal limits  CBC  MAGNESIUM  URINALYSIS, ROUTINE W REFLEX MICROSCOPIC  CBG MONITORING, ED  TROPONIN I (HIGH SENSITIVITY)    EKG EKG Interpretation  Date/Time:  Sunday March 07 2022 15:00:07 EDT Ventricular Rate:  65 PR Interval:  160 QRS Duration: 90 QT Interval:  436 QTC Calculation: 453 R Axis:   38 Text Interpretation: Normal sinus rhythm Normal ECG When compared with ECG of 07-Apr-2021 19:20,  no change noted Confirmed by Madalyn Rob [phone]) on 03/07/2022 3:32:50 PM  Radiology No results found.  Procedures Procedures    Medications Ordered in ED Medications - No data to display  ED Course/ Medical Decision Making/ A&P                           Medical Decision Making Amount and/or Complexity of Data Reviewed Labs: ordered.   56 year old lady presenting to ER after  having episode of feeling lightheaded, tunnel vision, palpitations.  Describes presyncopal type symptoms but she did not feel that she was going to pass out.  She denied passing out.  She now has no ongoing symptoms.  Her EKG demonstrates sinus rhythm, cardiac monitor per my review demonstrates normal sinus rhythm.  Blood pressure is normal.  She has no anemia on blood work today.  Her potassium level is slightly low at 3.3 but I do not feel this is low enough to cause any of her symptoms.  Normal magnesium.  No ischemic change on EKG and normal troponin, doubt ACS.  Etiology for episode today not clear however given her normal appearance, well appearance, reassuring work-up today, feel she can be discharged into the outpatient setting.  Reports already having cardiology referral.  Advised that she follow-up with a cardiologist and her primary care doctor.  Reviewed precautions with patient and her husband at bedside and discharged home.  Obtain additional history from review of her PCP visit on 5/30 through care everywhere.  She had brief episode of SVT on the ZIO monitor.  She was referred to cardiology.       Final Clinical Impression(s) / ED Diagnoses Final diagnoses:  Lightheadedness    Rx / DC Orders ED Discharge Orders     None         Lucrezia Starch, MD 03/07/22 1753

## 2022-03-07 NOTE — ED Triage Notes (Signed)
Pt arrives pov , to triage in wheelchair, c/o feeling lightheaded and "tunnel vision", and weakness and tachycardia and palpitations with shob today starting at 1100. Pt AO x 4, speech clear, bilaterally equal. Denies CP, "felt twinge on left side on the way" to hospital.

## 2022-04-05 ENCOUNTER — Other Ambulatory Visit: Payer: Self-pay

## 2022-04-05 ENCOUNTER — Encounter (HOSPITAL_BASED_OUTPATIENT_CLINIC_OR_DEPARTMENT_OTHER): Payer: Self-pay | Admitting: Emergency Medicine

## 2022-04-05 ENCOUNTER — Emergency Department (HOSPITAL_BASED_OUTPATIENT_CLINIC_OR_DEPARTMENT_OTHER)
Admission: EM | Admit: 2022-04-05 | Discharge: 2022-04-05 | Disposition: A | Discharge status: Home / Self Care | Payer: BC Managed Care – PPO | Attending: Emergency Medicine | Admitting: Emergency Medicine

## 2022-04-05 DIAGNOSIS — R42 Dizziness and giddiness: Secondary | ICD-10-CM | POA: Diagnosis present

## 2022-04-05 LAB — CBC
HCT: 37.8 % (ref 36.0–46.0)
Hemoglobin: 12.8 g/dL (ref 12.0–15.0)
MCH: 29.6 pg (ref 26.0–34.0)
MCHC: 33.9 g/dL (ref 30.0–36.0)
MCV: 87.3 fL (ref 80.0–100.0)
Platelets: 290 10*3/uL (ref 150–400)
RBC: 4.33 MIL/uL (ref 3.87–5.11)
RDW: 12.8 % (ref 11.5–15.5)
WBC: 5.9 10*3/uL (ref 4.0–10.5)
nRBC: 0 % (ref 0.0–0.2)

## 2022-04-05 LAB — BASIC METABOLIC PANEL
Anion gap: 7 (ref 5–15)
BUN: 16 mg/dL (ref 6–20)
CO2: 26 mmol/L (ref 22–32)
Calcium: 9.6 mg/dL (ref 8.9–10.3)
Chloride: 106 mmol/L (ref 98–111)
Creatinine, Ser: 0.64 mg/dL (ref 0.44–1.00)
GFR, Estimated: >60 mL/min (ref >60)
Glucose, Bld: 88 mg/dL (ref 70–99)
Potassium: 3.5 mmol/L (ref 3.5–5.1)
Sodium: 139 mmol/L (ref 135–145)

## 2022-04-05 LAB — URINALYSIS, ROUTINE W REFLEX MICROSCOPIC
Bilirubin Urine: NEGATIVE
Glucose, UA: NEGATIVE mg/dL
Hgb urine dipstick: NEGATIVE
Ketones, ur: NEGATIVE mg/dL
Leukocytes,Ua: NEGATIVE
Nitrite: NEGATIVE
Protein, ur: NEGATIVE mg/dL
Specific Gravity, Urine: 1.015 (ref 1.005–1.030)
pH: 7.5 (ref 5.0–8.0)

## 2022-04-05 LAB — CBG MONITORING, ED: Glucose-Capillary: 89 mg/dL (ref 70–99)

## 2022-04-05 LAB — PREGNANCY, URINE: Preg Test, Ur: NEGATIVE

## 2022-04-05 MED ORDER — POTASSIUM CHLORIDE 10 MEQ/100ML IV SOLN
10.0000 meq | Freq: Once | INTRAVENOUS | Status: AC
  Administered 2022-04-05: 10 meq via INTRAVENOUS
  Filled 2022-04-05: qty 100

## 2022-04-05 MED ORDER — POTASSIUM CHLORIDE CRYS ER 20 MEQ PO TBCR
40.0000 meq | EXTENDED_RELEASE_TABLET | Freq: Once | ORAL | Status: AC
  Administered 2022-04-05: 40 meq via ORAL
  Filled 2022-04-05: qty 2

## 2022-04-05 MED ORDER — SODIUM CHLORIDE 0.9 % IV BOLUS
1000.0000 mL | Freq: Once | INTRAVENOUS | Status: AC
  Administered 2022-04-05: 1000 mL via INTRAVENOUS

## 2022-04-05 NOTE — ED Provider Notes (Signed)
[facility]ENC[facility] DEPARTMENT Provider Note   CSN: [id number] Arrival date & time: 04/05/22  1242     History  Chief Complaint  Patient presents with   Dizziness    Lori King is a 56 y.o. female.  Patient here after episode of dizziness and lightheadedness.  Been dealing with this for couple months.  Has seen cardiology.  Has been referred to endocrinology.  She has had issues with her potassium.  Denies any chest pain or shortness of breath or abdominal pain or nausea or vomiting.  No ringing or ears.  She does admit to some stress.  Seems to be worse when she moves.  Feels like her sugar gets low when she gets palpitations.  Denies any new activities.  No weakness or numbness or tingling.  No vision loss speech changes.  The history is provided by the patient.       Home Medications Prior to Admission medications   Medication Sig Start Date End Date Taking? Authorizing Provider  acetaminophen (TYLENOL) 500 MG tablet Take 500 mg by mouth every 6 (six) hours as needed.    [provider]  buPROPion (WELLBUTRIN XL) 300 MG 24 hr tablet Take 300 mg by mouth daily.    [provider]  EPINEPHrine (EPIPEN 2-PAK) 0.3 mg/0.3 mL IJ SOAJ injection Inject 0.3 mLs (0.3 mg total) into the muscle once as needed (for severe allergic reaction). CAll 911 immediately if you have to use this medicine 03/20/17   Lori Pickett, PA-C  fluticasone [facility]) 50 MCG/ACT nasal spray Place into both nostrils daily.    [provider]  loratadine (CLARITIN) 10 MG tablet Take 10 mg by mouth daily.    [provider]  LORazepam (ATIVAN) 0.5 MG tablet Take 0.5 mg by mouth every 6 (six) hours as needed for anxiety.    [provider]  meloxicam (MOBIC) 7.5 MG tablet Take 7.5 mg by mouth 2 (two) times daily.    [provider]  ondansetron (ZOFRAN ODT) 4 MG disintegrating tablet Take 1 tablet (4 mg total) by mouth every 8 (eight) hours as  needed for nausea or vomiting. Patient not taking: Reported on 09/25/2020 08/28/20   Nicholes Stairs, MD      Allergies    Other, Shellfish allergy, Doxycycline, Ciprofloxacin, and Penicillins    Review of Systems   Review of Systems  Physical Exam Updated Vital Signs BP (!) 145/89   Pulse (!) 57   Temp 98.5 F (36.9 C) (Oral)   Resp 18   SpO2 100%  Physical Exam Vitals and nursing note reviewed.  Constitutional:      General: She is not in acute distress.    Appearance: She is well-developed. She is not ill-appearing.  HENT:     Head: Normocephalic and atraumatic.     Nose: Nose normal.     Mouth/Throat:     Mouth: Mucous membranes are moist.  Eyes:     Extraocular Movements: Extraocular movements intact.     Conjunctiva/sclera: Conjunctivae normal.     Pupils: Pupils are equal, round, and reactive to light.  Cardiovascular:     Rate and Rhythm: Normal rate and regular rhythm.     Pulses: Normal pulses.     Heart sounds: Normal heart sounds. No murmur heard. Pulmonary:     Effort: Pulmonary effort is normal. No respiratory distress.     Breath sounds: Normal breath sounds.  Abdominal:     Palpations: Abdomen is  soft.     Tenderness: There is no abdominal tenderness.  Musculoskeletal:        General: No swelling.     Cervical back: Normal range of motion and neck supple.  Skin:    General: Skin is warm and dry.     Capillary Refill: Capillary refill takes less than 2 seconds.  Neurological:     General: No focal deficit present.     Mental Status: She is alert and oriented to person, place, and time.     Cranial Nerves: No cranial nerve deficit.     Sensory: No sensory deficit.     Motor: No weakness.     Coordination: Coordination normal.  Psychiatric:        Mood and Affect: Mood normal.     ED Results / Procedures / Treatments   Labs (all labs ordered are listed, but only abnormal results are displayed) Labs Reviewed  BASIC METABOLIC PANEL  CBC   URINALYSIS, ROUTINE W REFLEX MICROSCOPIC  PREGNANCY, URINE  CBG MONITORING, ED    EKG None  Radiology No results found.  Procedures Procedures    Medications Ordered in ED Medications  potassium chloride 10 mEq in 100 mL IVPB (10 mEq Intravenous New Bag/Given 04/05/22 1534)  sodium chloride 0.9 % bolus 1,000 mL (1,000 mLs Intravenous New Bag/Given 04/05/22 1533)  potassium chloride SA (KLOR-CON M) CR tablet 40 mEq (40 mEq Oral Given 04/05/22 1535)    ED Course/ Medical Decision Making/ A&P                           Medical Decision Making Amount and/or Complexity of Data Reviewed Labs: ordered.  Risk Prescription drug management.   Lori King is here with dizziness.  History of depression, anxiety, anemia.  Episode of dizziness and lightheadedness.  This has been recurrent for several months now.  Has been seen by cardiology and now referred to endocrinology.  Seems to be positional.  Happens at times where she feels like her sugars dropped and she will get palpitations.  May be SVT history.  Neurologically she appears intact.  She appears comfortable.  She has had some extra stress.  Differential diagnosis is likely some orthostatic/vasovagal type events versus may be some vertigo/labyrinthitis type process.  Seems less likely to be cardiac.  EKG per my review and interpretation shows sinus rhythm.  No ischemic changes.  She has worn a heart monitor without much found on that.  I am not concerned for stroke.  We will give IV fluids.  Will give IV potassium.  Patient with blood work collected including CBC, BMP, urinalysis.  Lab work showed no significant anemia, electrolyte abnormality, kidney injury, leukocytosis.  Potassium is 3.5.  Overall suspect some sort of postural/orthostatic type process.  Could be some inner ear issue.  Seems less likely to be an endocrine problem.  Feeling better after IV fluids.  Will refer her to ENT to complete outpatient work-up of this ongoing  chronic issue.  Discharged in good condition.  This chart was dictated using voice recognition software.  Despite best efforts to proofread,  errors can occur which can change the documentation meaning.         Final Clinical Impression(s) / ED Diagnoses Final diagnoses:  Dizziness    Rx / DC Orders ED Discharge Orders     None         Lennice Sites, DO 04/05/22 1607

## 2022-04-05 NOTE — ED Triage Notes (Signed)
Pt in wheelchair , recurrent dizziness, lightheadedness. See cardiologist for these issues .  Denies chest pain yet reports  shortness of breath

## 2022-05-11 DIAGNOSIS — F329 Major depressive disorder, single episode, unspecified: Secondary | ICD-10-CM | POA: Diagnosis not present

## 2022-05-21 DIAGNOSIS — R0989 Other specified symptoms and signs involving the circulatory and respiratory systems: Secondary | ICD-10-CM | POA: Diagnosis not present

## 2022-05-21 DIAGNOSIS — B349 Viral infection, unspecified: Secondary | ICD-10-CM | POA: Diagnosis not present

## 2022-05-21 DIAGNOSIS — Z20822 Contact with and (suspected) exposure to covid-19: Secondary | ICD-10-CM | POA: Diagnosis not present

## 2022-05-24 DIAGNOSIS — E2740 Unspecified adrenocortical insufficiency: Secondary | ICD-10-CM | POA: Diagnosis not present

## 2022-05-24 DIAGNOSIS — E876 Hypokalemia: Secondary | ICD-10-CM | POA: Diagnosis not present

## 2022-05-31 DIAGNOSIS — R519 Headache, unspecified: Secondary | ICD-10-CM | POA: Diagnosis not present

## 2022-05-31 DIAGNOSIS — R55 Syncope and collapse: Secondary | ICD-10-CM | POA: Diagnosis not present

## 2022-06-01 DIAGNOSIS — E876 Hypokalemia: Secondary | ICD-10-CM | POA: Diagnosis not present

## 2022-06-01 DIAGNOSIS — E2740 Unspecified adrenocortical insufficiency: Secondary | ICD-10-CM | POA: Diagnosis not present

## 2022-06-04 DIAGNOSIS — J0100 Acute maxillary sinusitis, unspecified: Secondary | ICD-10-CM | POA: Diagnosis not present

## 2022-06-07 ENCOUNTER — Other Ambulatory Visit: Payer: Self-pay | Admitting: Obstetrics and Gynecology

## 2022-06-07 DIAGNOSIS — Z1231 Encounter for screening mammogram for malignant neoplasm of breast: Secondary | ICD-10-CM

## 2022-06-16 DIAGNOSIS — F329 Major depressive disorder, single episode, unspecified: Secondary | ICD-10-CM | POA: Diagnosis not present

## 2022-06-17 DIAGNOSIS — E876 Hypokalemia: Secondary | ICD-10-CM | POA: Diagnosis not present

## 2022-06-17 DIAGNOSIS — E162 Hypoglycemia, unspecified: Secondary | ICD-10-CM | POA: Diagnosis not present

## 2022-06-25 DIAGNOSIS — E161 Other hypoglycemia: Secondary | ICD-10-CM | POA: Diagnosis not present

## 2022-06-25 DIAGNOSIS — E2740 Unspecified adrenocortical insufficiency: Secondary | ICD-10-CM | POA: Diagnosis not present

## 2022-06-25 DIAGNOSIS — E876 Hypokalemia: Secondary | ICD-10-CM | POA: Diagnosis not present

## 2022-06-28 DIAGNOSIS — E876 Hypokalemia: Secondary | ICD-10-CM | POA: Diagnosis not present

## 2022-06-28 DIAGNOSIS — E2740 Unspecified adrenocortical insufficiency: Secondary | ICD-10-CM | POA: Diagnosis not present

## 2022-07-09 DIAGNOSIS — E876 Hypokalemia: Secondary | ICD-10-CM | POA: Diagnosis not present

## 2022-07-13 DIAGNOSIS — F329 Major depressive disorder, single episode, unspecified: Secondary | ICD-10-CM | POA: Diagnosis not present

## 2022-07-20 DIAGNOSIS — R109 Unspecified abdominal pain: Secondary | ICD-10-CM | POA: Diagnosis not present

## 2022-07-20 DIAGNOSIS — Z841 Family history of disorders of kidney and ureter: Secondary | ICD-10-CM | POA: Diagnosis not present

## 2022-07-20 DIAGNOSIS — F419 Anxiety disorder, unspecified: Secondary | ICD-10-CM | POA: Diagnosis not present

## 2022-07-20 DIAGNOSIS — N3001 Acute cystitis with hematuria: Secondary | ICD-10-CM | POA: Diagnosis not present

## 2022-07-26 DIAGNOSIS — F32A Depression, unspecified: Secondary | ICD-10-CM | POA: Insufficient documentation

## 2022-07-30 ENCOUNTER — Encounter (HOSPITAL_BASED_OUTPATIENT_CLINIC_OR_DEPARTMENT_OTHER): Payer: Self-pay | Admitting: Emergency Medicine

## 2022-07-30 ENCOUNTER — Emergency Department (HOSPITAL_BASED_OUTPATIENT_CLINIC_OR_DEPARTMENT_OTHER)
Admission: EM | Admit: 2022-07-30 | Discharge: 2022-07-30 | Disposition: A | Discharge status: Home / Self Care | Payer: BC Managed Care – PPO | Attending: Emergency Medicine | Admitting: Emergency Medicine

## 2022-07-30 ENCOUNTER — Emergency Department (HOSPITAL_BASED_OUTPATIENT_CLINIC_OR_DEPARTMENT_OTHER): Payer: BC Managed Care – PPO

## 2022-07-30 ENCOUNTER — Other Ambulatory Visit: Payer: Self-pay

## 2022-07-30 DIAGNOSIS — K769 Liver disease, unspecified: Secondary | ICD-10-CM

## 2022-07-30 DIAGNOSIS — R1031 Right lower quadrant pain: Secondary | ICD-10-CM

## 2022-07-30 DIAGNOSIS — D259 Leiomyoma of uterus, unspecified: Secondary | ICD-10-CM | POA: Diagnosis not present

## 2022-07-30 DIAGNOSIS — N852 Hypertrophy of uterus: Secondary | ICD-10-CM | POA: Diagnosis not present

## 2022-07-30 LAB — COMPREHENSIVE METABOLIC PANEL
ALT: 17 U/L (ref 0–44)
AST: 15 U/L (ref 15–41)
Albumin: 3.9 g/dL (ref 3.5–5.0)
Alkaline Phosphatase: 53 U/L (ref 38–126)
Anion gap: 6 (ref 5–15)
BUN: 23 mg/dL — ABNORMAL HIGH (ref 6–20)
CO2: 28 mmol/L (ref 22–32)
Calcium: 9 mg/dL (ref 8.9–10.3)
Chloride: 104 mmol/L (ref 98–111)
Creatinine, Ser: 0.66 mg/dL (ref 0.44–1.00)
GFR, Estimated: >60 mL/min (ref >60)
Glucose, Bld: 96 mg/dL (ref 70–99)
Potassium: 3.5 mmol/L (ref 3.5–5.1)
Sodium: 138 mmol/L (ref 135–145)
Total Bilirubin: 0.9 mg/dL (ref 0.3–1.2)
Total Protein: 7.3 g/dL (ref 6.5–8.1)

## 2022-07-30 LAB — CBC
HCT: 37.4 % (ref 36.0–46.0)
Hemoglobin: 12.1 g/dL (ref 12.0–15.0)
MCH: 28.5 pg (ref 26.0–34.0)
MCHC: 32.4 g/dL (ref 30.0–36.0)
MCV: 88.2 fL (ref 80.0–100.0)
Platelets: 260 10*3/uL (ref 150–400)
RBC: 4.24 MIL/uL (ref 3.87–5.11)
RDW: 13.4 % (ref 11.5–15.5)
WBC: 5.9 10*3/uL (ref 4.0–10.5)
nRBC: 0 % (ref 0.0–0.2)

## 2022-07-30 LAB — LIPASE, BLOOD: Lipase: 47 U/L (ref 11–51)

## 2022-07-30 MED ORDER — IOHEXOL 300 MG/ML  SOLN
100.0000 mL | Freq: Once | INTRAMUSCULAR | Status: AC | PRN
  Administered 2022-07-30: 100 mL via INTRAVENOUS

## 2022-07-30 NOTE — Discharge Instructions (Addendum)
Your CT scan was without any emergent findings  There was a small abnormal finding on your liver which may represent a cyst or hemangioma.  This can be evaluated on the outpatient basis.  Please follow-up with your primary care doctor.  You were found to have some evidence of constipation on your CT scan.  Please read the attached information about bowel management.  GETTING TO GOOD BOWEL HEALTH. Irregular bowel habits such as constipation and diarrhea can lead to many problems over time.  Having one soft bowel movement a day is the most important way to prevent further problems.  The anorectal canal is designed to handle stretching and feces to safely manage our ability to get rid of solid waste (feces, poop, stool) out of our body.  BUT, hard constipated stools can act like ripping concrete bricks and diarrhea can be a burning fire to this very sensitive area of our body, causing inflamed hemorrhoids, anal fissures, increasing risk is perirectal abscesses, abdominal pain/bloating, an making irritable bowel worse.     The goal: ONE SOFT BOWEL MOVEMENT A DAY!  To have soft, regular bowel movements:  Drink at least 8 tall glasses of water a day.   Take plenty of fiber.  Fiber is the undigested part of plant food that passes into the colon, acting s "natures broom" to encourage bowel motility and movement.  Fiber can absorb and hold large amounts of water. This results in a larger, bulkier stool, which is soft and easier to pass. Work gradually over several weeks up to 6 servings a day of fiber (25g a day even more if needed) in the form of: Vegetables -- Root (potatoes, carrots, turnips), leafy green (lettuce, salad greens, celery, spinach), or cooked high residue (cabbage, broccoli, etc) Fruit -- Fresh (unpeeled skin & pulp), Dried (prunes, apricots, cherries, etc ),  or stewed ( applesauce)  Whole grain breads, pasta, etc (whole wheat)  Bran cereals  Bulking Agents -- This type of water-retaining  fiber generally is easily obtained each day by one of the following:  Psyllium bran -- The psyllium plant is remarkable because its ground seeds can retain so much water. This product is available as Metamucil, Konsyl, Effersyllium, Per Diem Fiber, or the less expensive generic preparation in drug and health food stores. Although labeled a laxative, it really is not a laxative.  Methylcellulose -- This is another fiber derived from wood which also retains water. It is available as Citrucel. Polyethylene Glycol - and "artificial" fiber commonly called Miralax or Glycolax.  It is helpful for people with gassy or bloated feelings with regular fiber Flax Seed - a less gassy fiber than psyllium No reading or other relaxing activity while on the toilet. If bowel movements take longer than 5 minutes, you are too constipated AVOID CONSTIPATION.  High fiber and water intake usually takes care of this.  Sometimes a laxative is needed to stimulate more frequent bowel movements, but  Laxatives are not a good long-term solution as it can wear the colon out. Osmotics (Milk of Magnesia, Fleets phosphosoda, Magnesium citrate, MiraLax, GoLytely) are safer than  Stimulants (Senokot, Castor Oil, Dulcolax, Ex Lax)    Do not take laxatives for more than 7days in a row.  IF SEVERELY CONSTIPATED, try a Bowel Retraining Program: Do not use laxatives.  Eat a diet high in roughage, such as bran cereals and leafy vegetables.  Drink six (6) ounces of prune or apricot juice each morning.  Eat two (2) large servings  of stewed fruit each day.  Take one (1) heaping tablespoon of a psyllium-based bulking agent twice a day. Use sugar-free sweetener when possible to avoid excessive calories.  Eat a normal breakfast.  Set aside 15 minutes after breakfast to sit on the toilet, but do not strain to have a bowel movement.  If you do not have a bowel movement by the third day, use an enema and repeat the above steps.  Controlling  diarrhea Switch to liquids and simpler foods for a few days to avoid stressing your intestines further. Avoid dairy products (especially milk & ice cream) for a short time.  The intestines often can lose the ability to digest lactose when stressed. Avoid foods that cause gassiness or bloating.  Typical foods include beans and other legumes, cabbage, broccoli, and dairy foods.  Every person has some sensitivity to other foods, so listen to our body and avoid those foods that trigger problems for you. Adding fiber (Citrucel, Metamucil, psyllium, Miralax) gradually can help thicken stools by absorbing excess fluid and retrain the intestines to act more normally.  Slowly increase the dose over a few weeks.  Too much fiber too soon can backfire and cause cramping & bloating. Probiotics (such as active yogurt, Align, etc) may help repopulate the intestines and colon with normal bacteria and calm down a sensitive digestive tract.  Most studies show it to be of mild help, though, and such products can be costly. Medicines: Bismuth subsalicylate (ex. Kayopectate, Pepto Bismol) every 30 minutes for up to 6 doses can help control diarrhea.  Avoid if pregnant. Loperamide (Immodium) can slow down diarrhea.  Start with two tablets ('4mg'$  total) first and then try one tablet every 6 hours.  Avoid if you are having fevers or severe pain.  If you are not better or start feeling worse, stop all medicines and call your doctor for advice Call your doctor if you are getting worse or not better.  Sometimes further testing (cultures, endoscopy, X-ray studies, bloodwork, etc) may be needed to help diagnose and treat the cause of the diarrhea.  Managing Pain  Pain after surgery or related to activity is often due to strain/injury to muscle, tendon, nerves and/or incisions.  This pain is usually short-term and will improve in a few months.   Many people find it helpful to do the following things TOGETHER to help speed the  process of healing and to get back to regular activity more quickly:  Avoid heavy physical activity  no lifting greater than 20 pounds Do not "push through" the pain.  Listen to your body and avoid positions and maneuvers than reproduce the pain Walking is okay as tolerated, but go slowly and stop when getting sore.  Remember: If it hurts to do it, then don't do it! Take Anti-inflammatory medication  Take with food/snack around the clock for 1-2 weeks This helps the muscle and nerve tissues become less irritable and calm down faster Choose ONE of the following over-the-counter medications: Naproxen '220mg'$  tabs (ex. Aleve) 1-2 pills twice a day  Ibuprofen '200mg'$  tabs (ex. Advil, Motrin) 3-4 pills with every meal and just before bedtime Acetaminophen '500mg'$  tabs (Tylenol) 1-2 pills with every meal and just before bedtime Use a Heating pad or Ice/Cold Pack 4-6 times a day May use warm bath/hottub  or showers Try Gentle Massage and/or Stretching  at the area of pain many times a day stop if you feel pain - do not overdo it  Try these steps together  to help you body heal faster and avoid making things get worse.  Doing just one of these things may not be enough.    If you are not getting better after two weeks or are noticing you are getting worse, contact our office for further advice; we may need to re-evaluate you & see what other things we can do to help.

## 2022-07-30 NOTE — ED Triage Notes (Signed)
RLQ pain , reports passed kidney stone  2 weeks ago . No Dx of it .  Was at [facility] , here for further evaluation .  Denies NV

## 2022-07-30 NOTE — ED Notes (Signed)
Pt at [place], unavail for IV.

## 2022-07-30 NOTE — ED Provider Notes (Signed)
[facility] EMERGENCY DEPARTMENT Provider Note   CSN: [id number] Arrival date & time: 07/30/22  1711     History  Chief Complaint  Patient presents with   Abdominal Pain    RLQ     Lori King is a 56 y.o. female.   Abdominal Pain  Patient is a 55 year old female with past medical history significant for anemia, anxiety, depression, past surgical history is significant for endometrial ablation, C-section x 2  She is present emergency room today with 2 weeks of right lower quadrant abdominal pain.  She denies any nausea vomiting or fevers.  She denies any lightness or dizziness.  She states her pain has been waxing and waning but progressively seems to have been gradually worsening.  She denies any chest pain difficulty breathing changes with her bowel movements specifically no blood in her stool or diarrhea.  No recent antibiotics  She was seen at urgent care and sent to the emergency room for concern for appendicitis.  She denies any night sweats has not had any pain with walking or with going over bumps in the road.     Home Medications Prior to Admission medications   Medication Sig Start Date End Date Taking? Authorizing Provider  acetaminophen (TYLENOL) 500 MG tablet Take 500 mg by mouth every 6 (six) hours as needed.    [provider]  buPROPion (WELLBUTRIN XL) 300 MG 24 hr tablet Take 300 mg by mouth daily.    [provider]  EPINEPHrine (EPIPEN 2-PAK) 0.3 mg/0.3 mL IJ SOAJ injection Inject 0.3 mLs (0.3 mg total) into the muscle once as needed (for severe allergic reaction). CAll 911 immediately if you have to use this medicine 03/20/17   Larene Pickett, PA-C  fluticasone [facility]) 50 MCG/ACT nasal spray Place into both nostrils daily.    [provider]  loratadine (CLARITIN) 10 MG tablet Take 10 mg by mouth daily.    [provider]  LORazepam (ATIVAN) 0.5 MG tablet Take 0.5 mg by mouth every 6 (six) hours as needed  for anxiety.    [provider]  meloxicam (MOBIC) 7.5 MG tablet Take 7.5 mg by mouth 2 (two) times daily.    [provider]  ondansetron (ZOFRAN ODT) 4 MG disintegrating tablet Take 1 tablet (4 mg total) by mouth every 8 (eight) hours as needed for nausea or vomiting. Patient not taking: Reported on 09/25/2020 08/28/20   Nicholes Stairs, MD      Allergies    Other, Shellfish allergy, Doxycycline, Ciprofloxacin, and Penicillins    Review of Systems   Review of Systems  Gastrointestinal:  Positive for abdominal pain.    Physical Exam Updated Vital Signs BP 125/88   Pulse 79   Temp 98.2 F (36.8 C) (Oral)   Resp 18   Wt 59.9 kg   SpO2 99%   BMI 23.38 kg/m  Physical Exam Vitals and nursing note reviewed.  Constitutional:      General: She is not in acute distress. HENT:     Head: Normocephalic and atraumatic.     Nose: Nose normal.     Mouth/Throat:     Mouth: Mucous membranes are moist.  Eyes:     General: No scleral icterus. Cardiovascular:     Rate and Rhythm: Normal rate and regular rhythm.     Pulses: Normal pulses.     Heart sounds: Normal heart sounds.  Pulmonary:     Effort: Pulmonary effort is normal. No respiratory  distress.     Breath sounds: No wheezing.  Abdominal:     Palpations: Abdomen is soft.     Tenderness: There is abdominal tenderness.     Comments: Tenderness to palpation of the right lower quadrant/right pelvic tenderness  Musculoskeletal:     Cervical back: Normal range of motion.     Right lower leg: No edema.     Left lower leg: No edema.  Skin:    General: Skin is warm and dry.     Capillary Refill: Capillary refill takes less than 2 seconds.  Neurological:     Mental Status: She is alert. Mental status is at baseline.  Psychiatric:        Mood and Affect: Mood normal.        Behavior: Behavior normal.     ED Results / Procedures / Treatments   Labs (all labs ordered are listed, but only abnormal results  are displayed) Labs Reviewed  COMPREHENSIVE METABOLIC PANEL - Abnormal; Notable for the following components:      Result Value   BUN 23 (*)    All other components within normal limits  LIPASE, BLOOD  CBC  URINALYSIS, ROUTINE W REFLEX MICROSCOPIC    EKG None  Radiology CT ABDOMEN PELVIS W CONTRAST  Result Date: 07/30/2022 CLINICAL DATA:  Right lower quadrant pain, tender to palpation, recent urinary tract calculus EXAM: CT ABDOMEN AND PELVIS WITH CONTRAST TECHNIQUE: Multidetector CT imaging of the abdomen and pelvis was performed using the standard protocol following bolus administration of intravenous contrast. RADIATION DOSE REDUCTION: This exam was performed according to the departmental dose-optimization program which includes automated exposure control, adjustment of the mA and/or kV according to patient size and/or use of iterative reconstruction technique. CONTRAST:  113m OMNIPAQUE IOHEXOL 300 MG/ML  SOLN COMPARISON:  None Available. FINDINGS: Lower chest: No acute pleural or parenchymal lung disease. Hepatobiliary: Calcified gallstones without evidence of acute cholecystitis. Indeterminate 1.3 cm hypodensity within the left lobe liver near the falciform ligament, reference image 23/2. No other focal liver abnormalities. No biliary duct dilation. Pancreas: Unremarkable. No pancreatic ductal dilatation or surrounding inflammatory changes. Spleen: Normal in size without focal abnormality. Adrenals/Urinary Tract: The kidneys enhance normally and symmetrically. No urinary tract calculi or obstructive uropathy within either kidney. The adrenals and bladder are unremarkable. Stomach/Bowel: No bowel obstruction or ileus. There is moderate retained stool throughout the colon compatible with constipation. Normal gas-filled appendix right lower quadrant. No bowel wall thickening or inflammatory change. Vascular/Lymphatic: Aortic atherosclerosis. No enlarged abdominal or pelvic lymph nodes.  Reproductive: Heterogeneous enlarged uterus consistent with multiple fibroids. Largest fibroid submucosal in location off the ventral lower uterine segment measures up to 2.9 cm. There are no adnexal masses. Other: No free fluid or free intraperitoneal gas. Small fat containing umbilical hernia. No bowel herniation. Musculoskeletal: No acute or destructive bony lesions. Reconstructed images demonstrate no additional findings. IMPRESSION: 1. No acute intra-abdominal or intrapelvic process. Normal appendix. 2. Moderate retained stool throughout the colon consistent with constipation. 3. Fibroid uterus. 4. Indeterminate 1.3 cm hypodensity ventral aspect left lobe liver as above. While this area becomes less apparent on delayed imaging and could reflect a hemangioma, the imaging characteristics are indeterminate on this study. Definitive characterization with nonemergent outpatient dedicated liver MRI with and without IV contrast could be performed. Electronically Signed   By: MRanda NgoM.D.   On: 07/30/2022 22:42   [place]PELV[place] COMPLETE WITH TRANSVAGINAL  Result Date: 07/30/2022 CLINICAL DATA:  Right lower quadrant pain  EXAM: TRANSABDOMINAL AND TRANSVAGINAL ULTRASOUND OF PELVIS TECHNIQUE: Both transabdominal and transvaginal ultrasound examinations of the pelvis were performed. Transabdominal technique was performed for global imaging of the pelvis including uterus, ovaries, adnexal regions, and pelvic cul-de-sac. It was necessary to proceed with endovaginal exam following the transabdominal exam to visualize the endometrium. COMPARISON:  None Available. FINDINGS: Uterus Measurements: 9.7 x 4.4 x 6.2 cm. Retroflexed. At least 2 uterine fibroids including 3.0 cm posterior uterine body fibroid which may be partially submucosal. Additional 2.7 cm fibroid along the left uterine body. Endometrium Suboptimally assessed given the retroflexed positioning of the uterus and adjacent fibroid. Visualized endometrial stripe  measures approximately 5 mm on transabdominal imaging. Right ovary Not visualized. Left ovary Not visualized. Other findings No abnormal free fluid. IMPRESSION: 1. Fibroid uterus, the largest of which appears partially submucosal. 2. Nonvisualization of the ovaries. Electronically Signed   By: Davina Poke D.O.   On: 07/30/2022 19:46    Procedures Procedures    Medications Ordered in ED Medications  iohexol (OMNIPAQUE) 300 MG/ML solution 100 mL (100 mLs Intravenous Contrast Given 07/30/22 2224)    ED Course/ Medical Decision Making/ A&P                           Medical Decision Making Amount and/or Complexity of Data Reviewed Labs: ordered. Radiology: ordered.  Risk Prescription drug management.   This patient presents to the ED for concern of abdominal pain, this involves a number of treatment options, and is a complaint that carries with it a moderate risk of complications and morbidity. A differential diagnosis was considered for the patient's symptoms which is discussed below:   Excessive differential diagnosis considered however given patient's persistent symptoms and her age her primary concern is for an oncologic cause of her symptoms  Shared decision-making oversedation was conducted with patient and she would prefer to have CT scan after the results of her ultrasound of the pelvis   Co morbidities: Discussed in HPI   Brief History:  Patient is a 56 year old female with past medical history significant for anemia, anxiety, depression, past surgical history is significant for endometrial ablation, C-section x 2  She is present emergency room today with 2 weeks of right lower quadrant abdominal pain.  She denies any nausea vomiting or fevers.  She denies any lightness or dizziness.  She states her pain has been waxing and waning but progressively seems to have been gradually worsening.  She denies any chest pain difficulty breathing changes with her bowel movements  specifically no blood in her stool or diarrhea.  No recent antibiotics  She was seen at urgent care and sent to the emergency room for concern for appendicitis.  She denies any night sweats has not had any pain with walking or with going over bumps in the road.    EMR reviewed including pt PMHx, past surgical history and past visits to ER.   See HPI for more details   Lab Tests:   I personally reviewed all laboratory work and imaging. Metabolic panel without any acute abnormality specifically kidney function within normal limits and no significant electrolyte abnormalities. CBC without leukocytosis or significant anemia. No leukocytosis or anemia.  Lipase within normal limits  I reviewed patient's urine dipstick from urgent care it is completely normal with no nitrates leukocytes and no abnormal findings.  She informs me that urine culture was obtained at urgent care.  I see no indication for repeat urine  today.  Imaging Studies:  Abnormal findings. I personally reviewed all imaging studies. Imaging notable for  Fibroid uterus, small possible hemangioma on liver.  No other abnormal findings to explain patient's symptoms  Cardiac Monitoring:  NA NA   Medicines ordered:     Critical Interventions:     Consults/Attending Physician      Reevaluation:  After the interventions noted above I re-evaluated patient and found that they have :stayed the same   Social Determinants of Health:      Problem List / ED Course:  Abdominal pain seems to have been persistent in nature I have very low suspicion for appendicitis which is what ultimately prompted to urgent care to refer to ER however given the persistence of her symptoms I do have some concern for oncologic cause CT abdomen pelvis was obtained which showed no acute abnormal finding.  Pelvic ultrasound did show fibroid uterus and there was a small liver lesion found on CT scan which can be followed up not  emergently by PCP.  Return precautions were discussed.  Discharge home at this time patient tolerating p.o. and ambulatory, discharged.  Also some evidence of constipation on CT.  Bowel regimen discussed.   Dispostion:  After consideration of the diagnostic results and the patients response to treatment, I feel that the patent would benefit from outpatient follow-up.   Final Clinical Impression(s) / ED Diagnoses Final diagnoses:  Right lower quadrant abdominal pain    Rx / DC Orders ED Discharge Orders     None         [name], [place] 07/30/22 2304    Margette Fast, MD 08/05/22 1504

## 2022-07-30 NOTE — ED Provider Triage Note (Signed)
Emergency Medicine Provider Triage Evaluation Note  Lori King , a 56 y.o. female  was evaluated in triage.  Pt complains of right lower abdominal pain that seems to have waxed and waned over the past 2 weeks.  She was placed on antibiotics for UTI recently seems that her main symptoms are urinary frequency.  She states she went to urgent care today because she finished her antibiotics and continued to have some pain in this area and was told to come the emergency room.  She does think she may have had a kidney stone but it seems as she has not had any imaging to confirm this.  No history of ovarian cancer or cyst that she is aware of.  No vaginal discharge.  No dysuria.  No change with bowel movements.  No nausea or vomiting  Review of Systems  Positive: Right lower pelvic/abdominal pain. Negative: Fever  Physical Exam  BP 134/82 (BP Location: Left Arm)   Pulse 82   Temp 98 F (36.7 C) (Oral)   Resp 18   Wt 59.9 kg   SpO2 100%   BMI 23.38 kg/m  Gen:   Awake, no distress   Resp:  Normal effort  MSK:   Moves extremities without difficulty  Other:  No abdominal tenderness, some tenderness right pelvis  Medical Decision Making  Medically screening exam initiated at 6:20 PM.  Appropriate orders placed.  Lori King was informed that the remainder of the evaluation will be completed by another provider, this initial triage assessment does not replace that evaluation, and the importance of remaining in the ED until their evaluation is complete.  Ultrasound   [name], [place] 07/30/22 1821

## 2022-08-02 DIAGNOSIS — F32A Depression, unspecified: Secondary | ICD-10-CM | POA: Diagnosis not present

## 2022-08-02 DIAGNOSIS — F419 Anxiety disorder, unspecified: Secondary | ICD-10-CM | POA: Diagnosis not present

## 2022-08-02 DIAGNOSIS — N390 Urinary tract infection, site not specified: Secondary | ICD-10-CM | POA: Diagnosis not present

## 2022-08-02 DIAGNOSIS — E161 Other hypoglycemia: Secondary | ICD-10-CM | POA: Diagnosis not present

## 2022-08-02 DIAGNOSIS — R1031 Right lower quadrant pain: Secondary | ICD-10-CM | POA: Diagnosis not present

## 2022-08-04 DIAGNOSIS — Z01419 Encounter for gynecological examination (general) (routine) without abnormal findings: Secondary | ICD-10-CM | POA: Diagnosis not present

## 2022-08-04 DIAGNOSIS — Z124 Encounter for screening for malignant neoplasm of cervix: Secondary | ICD-10-CM | POA: Diagnosis not present

## 2022-08-04 DIAGNOSIS — Z6823 Body mass index (BMI) 23.0-23.9, adult: Secondary | ICD-10-CM | POA: Diagnosis not present

## 2022-08-04 DIAGNOSIS — Z1151 Encounter for screening for human papillomavirus (HPV): Secondary | ICD-10-CM | POA: Diagnosis not present

## 2022-08-04 LAB — HM PAP SMEAR: HM Pap smear: NORMAL

## 2022-08-10 DIAGNOSIS — F329 Major depressive disorder, single episode, unspecified: Secondary | ICD-10-CM | POA: Diagnosis not present

## 2022-09-07 DIAGNOSIS — F329 Major depressive disorder, single episode, unspecified: Secondary | ICD-10-CM | POA: Diagnosis not present

## 2022-09-08 ENCOUNTER — Ambulatory Visit
Admission: RE | Admit: 2022-09-08 | Discharge: 2022-09-08 | Disposition: A | Discharge status: Home / Self Care | Payer: BC Managed Care – PPO | Source: Ambulatory Visit | Attending: Obstetrics and Gynecology | Admitting: Obstetrics and Gynecology

## 2022-09-08 DIAGNOSIS — Z1231 Encounter for screening mammogram for malignant neoplasm of breast: Secondary | ICD-10-CM

## 2022-09-10 DIAGNOSIS — K7689 Other specified diseases of liver: Secondary | ICD-10-CM | POA: Insufficient documentation

## 2022-09-10 DIAGNOSIS — Z86010 Personal history of colonic polyps: Secondary | ICD-10-CM | POA: Diagnosis not present

## 2022-09-10 DIAGNOSIS — R932 Abnormal findings on diagnostic imaging of liver and biliary tract: Secondary | ICD-10-CM | POA: Diagnosis not present

## 2022-09-10 DIAGNOSIS — K5900 Constipation, unspecified: Secondary | ICD-10-CM | POA: Diagnosis not present

## 2022-09-16 DIAGNOSIS — E161 Other hypoglycemia: Secondary | ICD-10-CM | POA: Diagnosis not present

## 2022-09-16 DIAGNOSIS — E2740 Unspecified adrenocortical insufficiency: Secondary | ICD-10-CM | POA: Diagnosis not present

## 2022-09-21 DIAGNOSIS — E161 Other hypoglycemia: Secondary | ICD-10-CM | POA: Diagnosis not present

## 2022-09-21 DIAGNOSIS — F329 Major depressive disorder, single episode, unspecified: Secondary | ICD-10-CM | POA: Diagnosis not present

## 2022-09-21 DIAGNOSIS — E2740 Unspecified adrenocortical insufficiency: Secondary | ICD-10-CM | POA: Diagnosis not present

## 2022-10-06 DIAGNOSIS — K760 Fatty (change of) liver, not elsewhere classified: Secondary | ICD-10-CM | POA: Diagnosis not present

## 2022-10-06 DIAGNOSIS — K769 Liver disease, unspecified: Secondary | ICD-10-CM | POA: Diagnosis not present

## 2022-10-06 DIAGNOSIS — R932 Abnormal findings on diagnostic imaging of liver and biliary tract: Secondary | ICD-10-CM | POA: Diagnosis not present

## 2022-10-06 DIAGNOSIS — K8020 Calculus of gallbladder without cholecystitis without obstruction: Secondary | ICD-10-CM | POA: Diagnosis not present

## 2022-10-06 DIAGNOSIS — R9389 Abnormal findings on diagnostic imaging of other specified body structures: Secondary | ICD-10-CM | POA: Diagnosis not present

## 2022-10-19 DIAGNOSIS — F32A Depression, unspecified: Secondary | ICD-10-CM | POA: Diagnosis not present

## 2022-10-19 DIAGNOSIS — F419 Anxiety disorder, unspecified: Secondary | ICD-10-CM | POA: Diagnosis not present

## 2022-10-19 DIAGNOSIS — K760 Fatty (change of) liver, not elsewhere classified: Secondary | ICD-10-CM | POA: Diagnosis not present

## 2022-10-19 DIAGNOSIS — Z0000 Encounter for general adult medical examination without abnormal findings: Secondary | ICD-10-CM | POA: Diagnosis not present

## 2022-10-19 DIAGNOSIS — K7689 Other specified diseases of liver: Secondary | ICD-10-CM | POA: Diagnosis not present

## 2022-10-19 DIAGNOSIS — F411 Generalized anxiety disorder: Secondary | ICD-10-CM | POA: Diagnosis not present

## 2022-10-19 DIAGNOSIS — E161 Other hypoglycemia: Secondary | ICD-10-CM | POA: Diagnosis not present

## 2022-10-19 DIAGNOSIS — Z780 Asymptomatic menopausal state: Secondary | ICD-10-CM | POA: Diagnosis not present

## 2022-10-19 DIAGNOSIS — F329 Major depressive disorder, single episode, unspecified: Secondary | ICD-10-CM | POA: Diagnosis not present

## 2022-11-16 DIAGNOSIS — F329 Major depressive disorder, single episode, unspecified: Secondary | ICD-10-CM | POA: Diagnosis not present

## 2022-11-29 DIAGNOSIS — E2740 Unspecified adrenocortical insufficiency: Secondary | ICD-10-CM | POA: Diagnosis not present

## 2022-11-29 DIAGNOSIS — E161 Other hypoglycemia: Secondary | ICD-10-CM | POA: Diagnosis not present

## 2022-11-30 DIAGNOSIS — F329 Major depressive disorder, single episode, unspecified: Secondary | ICD-10-CM | POA: Diagnosis not present

## 2022-12-06 DIAGNOSIS — L821 Other seborrheic keratosis: Secondary | ICD-10-CM | POA: Diagnosis not present

## 2022-12-06 DIAGNOSIS — B351 Tinea unguium: Secondary | ICD-10-CM | POA: Diagnosis not present

## 2022-12-06 DIAGNOSIS — D1801 Hemangioma of skin and subcutaneous tissue: Secondary | ICD-10-CM | POA: Diagnosis not present

## 2022-12-06 DIAGNOSIS — Z780 Asymptomatic menopausal state: Secondary | ICD-10-CM | POA: Diagnosis not present

## 2022-12-06 DIAGNOSIS — M85852 Other specified disorders of bone density and structure, left thigh: Secondary | ICD-10-CM | POA: Diagnosis not present

## 2022-12-06 DIAGNOSIS — L658 Other specified nonscarring hair loss: Secondary | ICD-10-CM | POA: Diagnosis not present

## 2022-12-08 DIAGNOSIS — K640 First degree hemorrhoids: Secondary | ICD-10-CM | POA: Diagnosis not present

## 2022-12-08 DIAGNOSIS — Z86010 Personal history of colonic polyps: Secondary | ICD-10-CM | POA: Diagnosis not present

## 2022-12-08 DIAGNOSIS — K5730 Diverticulosis of large intestine without perforation or abscess without bleeding: Secondary | ICD-10-CM | POA: Diagnosis not present

## 2022-12-08 DIAGNOSIS — K648 Other hemorrhoids: Secondary | ICD-10-CM | POA: Diagnosis not present

## 2022-12-08 DIAGNOSIS — Z1211 Encounter for screening for malignant neoplasm of colon: Secondary | ICD-10-CM | POA: Diagnosis not present

## 2022-12-14 DIAGNOSIS — F329 Major depressive disorder, single episode, unspecified: Secondary | ICD-10-CM | POA: Diagnosis not present

## 2022-12-16 DIAGNOSIS — R5383 Other fatigue: Secondary | ICD-10-CM | POA: Diagnosis not present

## 2022-12-16 DIAGNOSIS — M25512 Pain in left shoulder: Secondary | ICD-10-CM | POA: Diagnosis not present

## 2022-12-16 DIAGNOSIS — F419 Anxiety disorder, unspecified: Secondary | ICD-10-CM | POA: Diagnosis not present

## 2022-12-16 DIAGNOSIS — E161 Other hypoglycemia: Secondary | ICD-10-CM | POA: Diagnosis not present

## 2022-12-17 ENCOUNTER — Encounter: Payer: BC Managed Care – PPO | Attending: Family Medicine | Admitting: Dietician

## 2022-12-17 ENCOUNTER — Encounter: Payer: Self-pay | Admitting: Dietician

## 2022-12-17 VITALS — Ht 63.0 in | Wt 132.0 lb

## 2022-12-17 DIAGNOSIS — E162 Hypoglycemia, unspecified: Secondary | ICD-10-CM | POA: Insufficient documentation

## 2022-12-17 NOTE — Progress Notes (Signed)
Medical Nutrition Therapy  Appointment Start time:  [phone]  Appointment End time:  1015 Patient is here today with her sister.  Primary concerns today: sudden brain fog, hot flashes, shaky, occasional tremor in her head that her husband notices, occasional pale or slurred words or palpitations or weakness in her legs, occasional blurry vision with drastic spike or plummet with CGM  Worst low currently around 6 am in the 50's to 60's Diagnosed with glucose tolerance test and went down to 30 at the 3 hour mark No tumors on pancrease or liver per patient  Referral diagnosis: hypoglycemia Treats low blood glucose with a glucose tab only if she feels bad.  Sensor reading get to 50's and 60's.  Preferred learning style: no preference indicated Learning readiness:  ready, change in progress   NUTRITION ASSESSMENT   Anthropometrics  63" 132 lbs  12/17/2022 stable for the past 6 months 144 lbs 2023 (purposely lost weight and as a result of a low carbohydrate diet)   Clinical Medical Hx: reactive hypoglycemia, osteopenia, ?adrenal fatigue, anemia, depression, fatty liver, post menopause, constipation Medications: see list to include acarbose Labs: C-reactive protein <5, sed rate 30, rheumatoid factor <7 on 12/16/2022, A1C 5.2%, cholesterol 228, HDL 72, LDL 137, Triglycerides 97, vitamin D 32 on 10/19/22, Notable Signs/Symptoms: see above in reason for visit CGM:  Freestyle Libre 3  *low blood glucose will not register on the report unless low for a specific amount of time. CGM Results from download: 12/17/2022  % Time CGM active:   91 %   (Goal >70%)  Average glucose:   91 mg/dL for 14 days  Glucose management indicator:   5.5 %  Time in range (70-180 mg/dL):   960 %   (Goal >45%)  Time High (181-250 mg/dL):   0 %   (Goal < 40%)  Time Very High (>250 mg/dL):    0 %   (Goal < 5%)  Time Low (54-69 mg/dL):   0 %   (Goal <9%)  Time Very Low (<54 mg/dL):   0 %   (Goal <8%)   Lifestyle & Dietary  Hx Patient lives with her husband.  She is a homemake. Allergies:  Fish and shellfish (anaphylactic shock) Multiple food allergy testing was negative Does not tolerate sugar substitutes Tolerates beans and lentils  Estimated daily fluid intake: 100 oz Supplements: vitamin D, calcium Sleep: 8 Stress / self-care: high Current average weekly physical activity: walks 5 days per week for >30 minutes (decreased from 1 hour due to symptoms), yoga occasionally, used to row (on a team) but not since shoulder surgery in 2020  24-Hr Dietary Recall First Meal: handful of sauteed fresh spinach with tomatoes, parmesan, 2 fried eggs and 40 calorie diet [employer] bread once per week Snack: nuts or spoon of natural PB Second Meal: salad with leftover meat (chicken, steak), olive oil and balsamic vinegar or occasional ranch, 1/8 cup blueberries Snack: low sugar greek yogurt with walnuts, occasional blueberries, occasional chia seeds Snack:  PB or jerky, cottage cheese Dinner 6:30-7:30: grilled chicken or pork or steak, roasted non-starchy vegetables OR quesadilla with low carb quesadilla Snack 10-10:30:  raw nuts Snack:  spoon of PB Beverages: water, 2 cups decaf coffee with half and half, decaf green tea and black tea, NO alcohol  Estimated Energy Needs Calories: 1800 Protein: 80g  NUTRITION DIAGNOSIS  NB-1.1 Food and nutrition-related knowledge deficit As related to nutrition and hypoglycemia.  As evidenced by diet hx and patient report.  NUTRITION INTERVENTION  Nutrition education (E-1) on the following topics:  CGM evaluation  Need for small frequent meals which are high in protein Discussion of potential affect of caffeine Self evaluation Exercising when she feels well enough  Handouts Provided Include  Hypoglycemia Nutrition Therapy from [facility]  Learning Style & Readiness for Change Teaching method utilized: Visual & Auditory  Demonstrated degree of understanding via: Teach Back  Barriers to  learning/adherence to lifestyle change: none  Goals Established by Pt Look for a waterproof over patch from [employer] or [employer]  Continue small frequent meals  More variety of vegetables as you tolerate Snack options should be high in protein  [employer] and cucumbers or blueberries  Nuts/nut butters  Meat wrapped in lettuce  Hummus and vegetables   Trial without coffee and tea  Stay active when you feel well enough to do so  Listen to your body   MONITORING & EVALUATION Dietary intake, physical activity, and symptoms in 2 weeks.  Next Steps  Patient is to call for questions.

## 2022-12-17 NOTE — Patient Instructions (Addendum)
Look for a waterproof over patch from [employer] or [employer]  Continue small frequent meals  More variety of vegetables as you tolerate Snack options should be high in protein  [employer] and cucumbers or blueberries  Nuts/nut butters  Meat wrapped in lettuce  Hummus and vegetables   Trial without coffee and tea  Stay active when you feel well enough to do so  Listen to your body

## 2022-12-22 DIAGNOSIS — M25512 Pain in left shoulder: Secondary | ICD-10-CM | POA: Diagnosis not present

## 2022-12-30 NOTE — Therapy (Signed)
OUTPATIENT PHYSICAL THERAPY SHOULDER EVALUATION   Patient Name: Lori King MRN: [id number] DOB:[birth year], 57 y.o., female Today's Date: 01/04/2023  END OF SESSION:  PT End of Session - 01/04/23 0932     Visit Number 1    Number of Visits 24    Date for PT Re-Evaluation 03/29/23    Authorization Type [employer] [id number]    PT Start Time 0845    PT Stop Time 0940    PT Time Calculation (min) 55 min    Activity Tolerance Patient tolerated treatment well             Past Medical History:  Diagnosis Date   Adhesive capsulitis of right shoulder    Allergic rhinitis, seasonal    Anemia    Anxiety    MDD (major depressive disorder)    Wears contact lenses    Past Surgical History:  Procedure Laterality Date   BREAST BIOPSY Left 2010   x2 per pt benign   CESAREAN SECTION  X2  last one 02-28-2001   W/  BILATERAL TUBAL LIGATION w/ last c/s   COLONOSCOPY  11/01/2017   HYSTEROSCOPY W/ ENDOMETRIAL ABLATION  2019   LUMBAR LAMINECTOMY/DECOMPRESSION MICRODISCECTOMY  2013   L4  --- S1   SHOULDER ARTHROSCOPY Right 08/28/2020   Procedure: ARTHROSCOPY SHOULDER lysis of adhesions and manipulation under anesthesia, extensive debridement;  Surgeon: Yolonda Kida, MD;  Location: [facility] [place];  Service: Orthopedics;  Laterality: Right;   There are no problems to display for this patient.   PCP: Dr Angelica Chessman  REFERRING PROVIDER: Dr Arlyce Harman  REFERRING DIAG: pain of Lt shoulder  THERAPY DIAG:  Acute pain of left shoulder  Muscle weakness (generalized)  Other symptoms and signs involving the musculoskeletal system  Rationale for Evaluation and Treatment: Rehabilitation  ONSET DATE: 10/24/22  SUBJECTIVE:                                                                                                                                                                                      SUBJECTIVE STATEMENT: Patient reports that she was lifting  her luggage out of the overhead bin when she felt pain in the Lt shoulder and has had some pain in an intermittent basis. She had an injection in Lt shoulder 12/22/22 with some improvement but shoulder is stiff and painful.  Hand dominance: Right  PERTINENT HISTORY: Rt shoulder pain 3 yrs with shoulder surgery for labrum tear and biceps tendon; adhesive capsulitis; reactive hypoglycemic disease; fatty liver  PAIN:  Are you having pain? Yes: NPRS scale: 5/10 with movement; 2/10 at rest; at worst 9/10 Pain location: Lt shoulder  Pain description: sharp; stabbing pain in biceps radiating to elbow  Aggravating factors: reaching overhead to the side and behind  Relieving factors: ice; TENs unit   PRECAUTIONS: None  WEIGHT BEARING RESTRICTIONS: No  FALLS:  Has patient fallen in last 6 months? No  LIVING ENVIRONMENT: Lives with: lives with their family and lives with their spouse Lives in: House/apartment  OCCUPATION: Housewife; household chores; Engineer, civil (consulting);  grandson in [place]     PLOF: Independent  PATIENT GOALS:get rid of the shoulder pain and increase ROM   NEXT MD VISIT: Dr Karen Chafe 01/11/23; (Dr Aundria Rud next week)   OBJECTIVE:   DIAGNOSTIC FINDINGS:  None available for shoulder; x-ray (-)    PATIENT SURVEYS:  FOTO not completed   COGNITION: Overall cognitive status: Within functional limits for tasks assessed     SENSATION: WFL  POSTURE: Patient presents with head forward posture with increased thoracic kyphosis; shoulders rounded and elevated; scapulae abducted and rotated along the thoracic spine; head of the humerus anterior in orientation.   UPPER EXTREMITY ROM:   Active ROM Right eval Left eval  Shoulder flexion 160 112  Shoulder extension 59 25  Shoulder abduction 168 112  Shoulder adduction    Shoulder internal rotation T7 Thumb to waist  Shoulder external rotation 95 85  Elbow flexion    Elbow extension    Wrist flexion    Wrist extension    Wrist  ulnar deviation    Wrist radial deviation    Wrist pronation    Wrist supination    (Blank rows = not tested)  UPPER EXTREMITY MMT: strength not assessed - moves Lt UE well against gravity; limited by pain  MMT Right eval Left eval  Shoulder flexion    Shoulder extension    Shoulder abduction    Shoulder adduction    Shoulder internal rotation    Shoulder external rotation    Middle trapezius    Lower trapezius    Elbow flexion    Elbow extension    Wrist flexion    Wrist extension    Wrist ulnar deviation    Wrist radial deviation    Wrist pronation    Wrist supination    Grip strength (lbs)    (Blank rows = not tested)  JOINT MOBILITY TESTING:  Significant joint tightness Lt shoulder in all planes   PALPATION:  Muscular tightness Lt shoulder through the upper trap; pecs; leveator; teres   [facility] Adult PT Treatment:                                                DATE: 01/04/23 Therapeutic Exercise: Review HEP  Shoulder flexion stepping back at counter Pec stretch in doorway  IR assisting with opposite UE ER with cane  Manual Therapy: STM/MFR through Lt shoulder girdle Joint mobilization Lt shoulder in all planes  ROM/stretch Lt shoulder  Neuromuscular re-ed: Postural correction engaging posterior shoulder girdle musculature  Modalities: Ice Lt shoulder x 10 min  Self Care: Myofacial ball release work     PATIENT EDUCATION: Education details: POC; HEP Person educated: Patient Education method: Programmer, multimedia, Facilities manager, Actor cues, Verbal cues, and Handouts Education comprehension: verbalized understanding, returned demonstration, verbal cues required, tactile cues required, and needs further education  HOME EXERCISE PROGRAM: Provided verbal review of HEP from previous treatment   ASSESSMENT:  CLINICAL IMPRESSION: Patient is a  57 y.o. female who was seen today for physical therapy evaluation and treatment for Lt shoulder pain which started with  lifting overhead her luggage when travelling. She has had gradually increasing pain and stiffness in the Lt shoulder. She received an injection ~ 1-2 weeks ago with some improvement. Patient presents with poor posture and alignment; limited A and PROM Lt shoulder; functional weakness due to pain; decreased functional activities and pain on a daily basis. Patient will benefit from PT to address problems identified.   OBJECTIVE IMPAIRMENTS: decreased activity tolerance, decreased mobility, decreased ROM, decreased strength, hypomobility, increased fascial restrictions, impaired perceived functional ability, increased muscle spasms, impaired flexibility, impaired UE functional use, improper body mechanics, postural dysfunction, and pain.   ACTIVITY LIMITATIONS: carrying, lifting, dressing, and reach over head  PARTICIPATION LIMITATIONS: meal prep, cleaning, laundry, and yard work  PERSONAL FACTORS: Past/current experiences and Time since onset of injury/illness/exacerbation are also affecting patient's functional outcome.   REHAB POTENTIAL: Good  CLINICAL DECISION MAKING: Stable/uncomplicated  EVALUATION COMPLEXITY: Low   GOALS: Goals reviewed with patient? Yes  SHORT TERM GOALS: Target date: 01/04/23  Independent in initial HEP  Baseline: Goal status: INITIAL  2.  Increase AROM shoulder flexion and abduction by 5-10 degrees  Baseline:  Goal status: INITIAL   LONG TERM GOALS: Target date: 03/29/2023   Decrease pain in the Lt shoulder by 75-100% allowing patient to return to normal functional activities  Baseline:  Goal status: INITIAL  2.  AROM Lt shoulder WFL's and pain free  Baseline:  Goal status: INITIAL  3.  Patient reports return to normal functional activities including lifting grandson with minimal to no increase in Lt shoulder pain  Baseline:  Goal status: INITIAL  4.  Independent in HEP  Baseline:  Goal status: INITIAL  PLAN:  PT FREQUENCY: 2x/week  PT  DURATION: 8 weeks  PLANNED INTERVENTIONS: Therapeutic exercises, Therapeutic activity, Neuromuscular re-education, Patient/Family education, Self Care, Joint mobilization, Aquatic Therapy, Dry Needling, Electrical stimulation, Spinal mobilization, Cryotherapy, Moist heat, Taping, Vasopneumatic device, Ultrasound, Ionotophoresis 4mg /ml Dexamethasone, Manual therapy, and Re-evaluation  PLAN FOR NEXT SESSION: review and progress exercises; continue with postural correction and education; manual work, DN, modalities as indicated    [employer], PT 01/04/2023, 9:34 AM

## 2023-01-04 ENCOUNTER — Ambulatory Visit: Payer: BC Managed Care – PPO | Attending: Family Medicine | Admitting: Rehabilitative and Restorative Service Providers"

## 2023-01-04 ENCOUNTER — Encounter: Payer: Self-pay | Admitting: Rehabilitative and Restorative Service Providers"

## 2023-01-04 ENCOUNTER — Other Ambulatory Visit: Payer: Self-pay

## 2023-01-04 DIAGNOSIS — G8929 Other chronic pain: Secondary | ICD-10-CM | POA: Insufficient documentation

## 2023-01-04 DIAGNOSIS — M25511 Pain in right shoulder: Secondary | ICD-10-CM | POA: Diagnosis not present

## 2023-01-04 DIAGNOSIS — R29898 Other symptoms and signs involving the musculoskeletal system: Secondary | ICD-10-CM

## 2023-01-04 DIAGNOSIS — M6281 Muscle weakness (generalized): Secondary | ICD-10-CM | POA: Diagnosis not present

## 2023-01-04 DIAGNOSIS — M25512 Pain in left shoulder: Secondary | ICD-10-CM

## 2023-01-05 ENCOUNTER — Ambulatory Visit: Payer: BC Managed Care – PPO | Admitting: Rehabilitative and Restorative Service Providers"

## 2023-01-05 ENCOUNTER — Encounter: Payer: Self-pay | Admitting: Rehabilitative and Restorative Service Providers"

## 2023-01-05 DIAGNOSIS — R29898 Other symptoms and signs involving the musculoskeletal system: Secondary | ICD-10-CM | POA: Diagnosis not present

## 2023-01-05 DIAGNOSIS — M25512 Pain in left shoulder: Secondary | ICD-10-CM

## 2023-01-05 DIAGNOSIS — G8929 Other chronic pain: Secondary | ICD-10-CM

## 2023-01-05 DIAGNOSIS — M25511 Pain in right shoulder: Secondary | ICD-10-CM | POA: Diagnosis not present

## 2023-01-05 DIAGNOSIS — M6281 Muscle weakness (generalized): Secondary | ICD-10-CM

## 2023-01-05 NOTE — Therapy (Addendum)
OUTPATIENT PHYSICAL THERAPY SHOULDER TREATMENT   Patient Name: Lori King MRN: [id number] DOB:04-Mar-1966, 57 y.o., female Today's Date: 01/05/2023  END OF SESSION:  PT End of Session - 01/05/23 0800     Visit Number 2    Number of Visits 24    Date for PT Re-Evaluation 03/29/23    Authorization Type [employer] [id number]    PT Start Time 0800    PT Stop Time 0848    PT Time Calculation (min) 48 min    Activity Tolerance Patient tolerated treatment well             Past Medical History:  Diagnosis Date   Adhesive capsulitis of right shoulder    Allergic rhinitis, seasonal    Anemia    Anxiety    MDD (major depressive disorder)    Wears contact lenses    Past Surgical History:  Procedure Laterality Date   BREAST BIOPSY Left 2010   x2 per pt benign   CESAREAN SECTION  X2  last one 02-28-2001   W/  BILATERAL TUBAL LIGATION w/ last c/s   COLONOSCOPY  11/01/2017   HYSTEROSCOPY W/ ENDOMETRIAL ABLATION  2019   LUMBAR LAMINECTOMY/DECOMPRESSION MICRODISCECTOMY  2013   L4  --- S1   SHOULDER ARTHROSCOPY Right 08/28/2020   Procedure: ARTHROSCOPY SHOULDER lysis of adhesions and manipulation under anesthesia, extensive debridement;  Surgeon: Yolonda Kida, MD;  Location: [facility] [place];  Service: Orthopedics;  Laterality: Right;   There are no problems to display for this patient.   PCP: Dr Angelica Chessman  REFERRING PROVIDER: Dr Arlyce Harman  REFERRING DIAG: pain of Lt shoulder  THERAPY DIAG:  Acute pain of left shoulder  Muscle weakness (generalized)  Other symptoms and signs involving the musculoskeletal system  Chronic right shoulder pain  Rationale for Evaluation and Treatment: Rehabilitation  ONSET DATE: 10/24/22  SUBJECTIVE:                                                                                                                                                                                      SUBJECTIVE STATEMENT: Patient  reports that she has increased soreness in the Lt shoulder following initial treatment   EVAL: Patient reports that she was lifting her luggage out of the overhead bin when she felt pain in the Lt shoulder and has had some pain in an intermittent basis. She had an injection in Lt shoulder 12/22/22 with some improvement but shoulder is stiff and painful.  Hand dominance: Right  PERTINENT HISTORY: Rt shoulder pain 3 yrs with shoulder surgery for labrum tear and biceps tendon; adhesive capsulitis; reactive hypoglycemic disease; fatty liver  PAIN:  Are you having pain? Yes: NPRS scale: 6/10 with movement; 2/10 at rest; at worst 9/10 Pain location: Lt shoulder  Pain description: sharp; stabbing pain in biceps radiating to elbow  Aggravating factors: reaching overhead to the side and behind  Relieving factors: ice; TENs unit   PRECAUTIONS: None  WEIGHT BEARING RESTRICTIONS: No  FALLS:  Has patient fallen in last 6 months? No   PATIENT GOALS:get rid of the shoulder pain and increase ROM   NEXT MD VISIT: Dr Karen Chafe 01/11/23; (Dr Aundria Rud next week)   OBJECTIVE:   DIAGNOSTIC FINDINGS:  None available for shoulder; x-ray (-)    COGNITION: Overall cognitive status: Within functional limits for tasks assessed     POSTURE: Patient presents with head forward posture with increased thoracic kyphosis; shoulders rounded and elevated; scapulae abducted and rotated along the thoracic spine; head of the humerus anterior in orientation.   UPPER EXTREMITY ROM:   Active ROM Right eval Left eval  Shoulder flexion 160 112  Shoulder extension 59 25  Shoulder abduction 168 112  Shoulder adduction    Shoulder internal rotation T7 Thumb to waist  Shoulder external rotation 95 85  Elbow flexion    Elbow extension    Wrist flexion    Wrist extension    Wrist ulnar deviation    Wrist radial deviation    Wrist pronation    Wrist supination    (Blank rows = not tested)  UPPER EXTREMITY MMT:  strength not assessed - moves Lt UE well against gravity; limited by pain  MMT Right eval Left eval  Shoulder flexion    Shoulder extension    Shoulder abduction    Shoulder adduction    Shoulder internal rotation    Shoulder external rotation    Middle trapezius    Lower trapezius    Elbow flexion    Elbow extension    Wrist flexion    Wrist extension    Wrist ulnar deviation    Wrist radial deviation    Wrist pronation    Wrist supination    Grip strength (lbs)    (Blank rows = not tested)  JOINT MOBILITY TESTING:  Significant joint tightness Lt shoulder in all planes   PALPATION:  Muscular tightness Lt shoulder through the upper trap; pecs; leveator; teres   [facility] Adult PT Treatment:                                                DATE: 01/05/23 Therapeutic Exercise: Review HEP  Shoulder flexion stepping back at counter Pec stretch in doorway  IR assisting with opposite UE ER with cane  Chin tuck Chest lift L's  W's  Manual Therapy: STM/MFR through Lt shoulder girdle Joint mobilization Lt shoulder in all planes pt supine and Rt sidelying  ROM/stretch Lt shoulder  Neuromuscular re-ed: Postural correction engaging posterior shoulder girdle musculature  Modalities: Moist heat Lt shoulder x 10 min  Self Care: Myofacial ball release work   [facility] Adult PT Treatment:                                                DATE: 01/04/23 Therapeutic Exercise: Review HEP  Shoulder flexion stepping back at counter  Pec stretch in doorway  IR assisting with opposite UE ER with cane  Manual Therapy: STM/MFR through Lt shoulder girdle Joint mobilization Lt shoulder in all planes  ROM/stretch Lt shoulder  Neuromuscular re-ed: Postural correction engaging posterior shoulder girdle musculature  Modalities: Ice Lt shoulder x 10 min  Self Care: Myofacial ball release work     PATIENT EDUCATION: Education details: POC; HEP Person educated: Patient Education method:  Programmer, multimedia, Facilities manager, Actor cues, Verbal cues, and Handouts Education comprehension: verbalized understanding, returned demonstration, verbal cues required, tactile cues required, and needs further education  HOME EXERCISE PROGRAM:  E-mailed to: [email or web address]  Access Code: [email or web address] URL: [email or web address] Date: 01/05/2023 Prepared by: Corlis Leak  Exercises - Seated Shoulder Flexion AAROM with Pulley Behind  - 2 x daily - 7 x weekly - 1 sets - 10 reps - 10 sec  hold - Seated Shoulder Scaption AAROM with Pulley at Side  - 2 x daily - 7 x weekly - 1 sets - 10 reps - 10sec  hold - Seated Cervical Retraction  - 2 x daily - 7 x weekly - 1-2 sets - 5-10 reps - 10 sec  hold - Seated Scapular Retraction  - 2 x daily - 7 x weekly - 1-2 sets - 10 reps - 10 sec  hold - Standing Scapular Retraction  - 3 x daily - 7 x weekly - 1 sets - 10 reps - 10 sec  hold - Shoulder External Rotation and Scapular Retraction  - 3 x daily - 7 x weekly - 1 sets - 10 reps - 3-5 sec   hold - Seated Shoulder W  - 2 x daily - 7 x weekly - 1 sets - 10 reps - 3 sec  hold  ASSESSMENT:  CLINICAL IMPRESSION: Patient reports that she is sore from initial treatment. Trial of DN and continued manual work to address muscular tightness.   OBJECTIVE IMPAIRMENTS: Lt shoulder pain which started with lifting overhead her luggage when travelling. She has had gradually increasing pain and stiffness in the Lt shoulder. She received an injection ~ 1-2 weeks ago with some improvement. Patient presents with poor posture and alignment; limited A and PROM Lt shoulder; functional weakness due to pain; decreased functional activities and pain on a daily basis    GOALS: Goals reviewed with patient? Yes  SHORT TERM GOALS: Target date: 01/04/23  Independent in initial HEP  Baseline: Goal status: INITIAL  2.  Increase AROM shoulder flexion and abduction by 5-10 degrees  Baseline:  Goal status: INITIAL   LONG  TERM GOALS: Target date: 03/29/2023   Decrease pain in the Lt shoulder by 75-100% allowing patient to return to normal functional activities  Baseline:  Goal status: INITIAL  2.  AROM Lt shoulder WFL's and pain free  Baseline:  Goal status: INITIAL  3.  Patient reports return to normal functional activities including lifting grandson with minimal to no increase in Lt shoulder pain  Baseline:  Goal status: INITIAL  4.  Independent in HEP  Baseline:  Goal status: INITIAL  PLAN:  PT FREQUENCY: 2x/week  PT DURATION: 8 weeks  PLANNED INTERVENTIONS: Therapeutic exercises, Therapeutic activity, Neuromuscular re-education, Patient/Family education, Self Care, Joint mobilization, Aquatic Therapy, Dry Needling, Electrical stimulation, Spinal mobilization, Cryotherapy, Moist heat, Taping, Vasopneumatic device, Ultrasound, Ionotophoresis 4mg /ml Dexamethasone, Manual therapy, and Re-evaluation  PLAN FOR NEXT SESSION: review and progress exercises; continue with postural correction and education; manual work, DN, modalities as indicated    [employer], PT 01/05/2023,  8:01 AM

## 2023-01-11 ENCOUNTER — Encounter: Payer: Self-pay | Admitting: Rehabilitative and Restorative Service Providers"

## 2023-01-11 ENCOUNTER — Ambulatory Visit: Payer: BC Managed Care – PPO | Admitting: Rehabilitative and Restorative Service Providers"

## 2023-01-11 DIAGNOSIS — G8929 Other chronic pain: Secondary | ICD-10-CM | POA: Diagnosis not present

## 2023-01-11 DIAGNOSIS — M25512 Pain in left shoulder: Secondary | ICD-10-CM

## 2023-01-11 DIAGNOSIS — R29898 Other symptoms and signs involving the musculoskeletal system: Secondary | ICD-10-CM | POA: Diagnosis not present

## 2023-01-11 DIAGNOSIS — M25511 Pain in right shoulder: Secondary | ICD-10-CM | POA: Diagnosis not present

## 2023-01-11 DIAGNOSIS — M6281 Muscle weakness (generalized): Secondary | ICD-10-CM | POA: Diagnosis not present

## 2023-01-11 DIAGNOSIS — M7502 Adhesive capsulitis of left shoulder: Secondary | ICD-10-CM | POA: Diagnosis not present

## 2023-01-11 NOTE — Therapy (Signed)
OUTPATIENT PHYSICAL THERAPY SHOULDER TREATMENT   Patient Name: Lori King MRN: [id number] DOB:1966/02/07, 57 y.o., female Today's Date: 01/11/2023  END OF SESSION:  PT End of Session - 01/11/23 1101     Visit Number 3    Number of Visits 24    Authorization Type BCBS [id number]    PT Start Time 1100    PT Stop Time 1148    PT Time Calculation (min) 48 min    Activity Tolerance Patient tolerated treatment well             Past Medical History:  Diagnosis Date   Adhesive capsulitis of right shoulder    Allergic rhinitis, seasonal    Anemia    Anxiety    MDD (major depressive disorder)    Wears contact lenses    Past Surgical History:  Procedure Laterality Date   BREAST BIOPSY Left 2010   x2 per pt benign   CESAREAN SECTION  X2  last one 02-28-2001   W/  BILATERAL TUBAL LIGATION w/ last c/s   COLONOSCOPY  11/01/2017   HYSTEROSCOPY W/ ENDOMETRIAL ABLATION  2019   LUMBAR LAMINECTOMY/DECOMPRESSION MICRODISCECTOMY  2013   L4  --- S1   SHOULDER ARTHROSCOPY Right 08/28/2020   Procedure: ARTHROSCOPY SHOULDER lysis of adhesions and manipulation under anesthesia, extensive debridement;  Surgeon: Yolonda Kida, MD;  Location: [facility];  Service: Orthopedics;  Laterality: Right;   There are no problems to display for this patient.   PCP: Dr Angelica Chessman  REFERRING PROVIDER: Dr Arlyce Harman  REFERRING DIAG: pain of Lt shoulder  THERAPY DIAG:  Acute pain of left shoulder  Muscle weakness (generalized)  Other symptoms and signs involving the musculoskeletal system  Chronic right shoulder pain  Rationale for Evaluation and Treatment: Rehabilitation  ONSET DATE: 10/24/22  SUBJECTIVE:                                                                                                                                                                                      SUBJECTIVE STATEMENT: Patient reports that she has increased  soreness in the Lt shoulder over the weekend. She was lifting her grandson repetitively over the weekend. Intensity of pain is decreased and stabbing pain is no longer there. ROM is getting better.   EVAL: Patient reports that she was lifting her luggage out of the overhead bin when she felt pain in the Lt shoulder and has had some pain in an intermittent basis. She had an injection in Lt shoulder 12/22/22 with some improvement but shoulder is stiff and painful.  Hand dominance: Right  PERTINENT HISTORY: Rt shoulder pain 3 yrs  with shoulder surgery for labrum tear and biceps tendon; adhesive capsulitis; reactive hypoglycemic disease; fatty liver  PAIN:  Are you having pain? Yes: NPRS scale: 5/10 with movement; 2/10 at rest; at worst 6/10 Pain location: Lt shoulder  Pain description: sharp; stabbing pain in biceps radiating to elbow  Aggravating factors: reaching overhead to the side and behind  Relieving factors: ice; TENs unit   PRECAUTIONS: None  WEIGHT BEARING RESTRICTIONS: No  FALLS:  Has patient fallen in last 6 months? No   PATIENT GOALS:get rid of the shoulder pain and increase ROM   NEXT MD VISIT: Dr Karen Chafe 01/11/23; (Dr Aundria Rud next week)   OBJECTIVE:   DIAGNOSTIC FINDINGS:  None available for shoulder; x-ray (-)    COGNITION: Overall cognitive status: Within functional limits for tasks assessed     POSTURE: Patient presents with head forward posture with increased thoracic kyphosis; shoulders rounded and elevated; scapulae abducted and rotated along the thoracic spine; head of the humerus anterior in orientation.   UPPER EXTREMITY ROM:   Active ROM Right eval Left eval Left  01/11/23  Shoulder flexion 160 112 130  Shoulder extension 59 25 48  Shoulder abduction 168 112 133  Shoulder adduction     Shoulder internal rotation T7 Thumb to waist Thumb  T12  Shoulder external rotation 95 85 93  Elbow flexion     Elbow extension     Wrist flexion     Wrist  extension     Wrist ulnar deviation     Wrist radial deviation     Wrist pronation     Wrist supination     (Blank rows = not tested) PASSIVE ROM - supine flexion 152; scaption 147; ER 90  UPPER EXTREMITY MMT: strength not assessed - moves Lt UE well against gravity; limited by pain  MMT Right eval Left eval  Shoulder flexion    Shoulder extension    Shoulder abduction    Shoulder adduction    Shoulder internal rotation    Shoulder external rotation    Middle trapezius    Lower trapezius    Elbow flexion    Elbow extension    Wrist flexion    Wrist extension    Wrist ulnar deviation    Wrist radial deviation    Wrist pronation    Wrist supination    Grip strength (lbs)    (Blank rows = not tested)  JOINT MOBILITY TESTING:  Significant joint tightness Lt shoulder in all planes   PALPATION:  Muscular tightness Lt shoulder through the upper trap; pecs; leveator; teres   [facility] Adult PT Treatment:                                                DATE: 01/11/23 Therapeutic Exercise: Review HEP  Shoulder flexion stepping back at counter Pec stretch in doorway  IR assisting with opposite UE ER with cane  Chin tuck Chest lift L's noodle along spine yellow TB 3 sec x 10  W's noodle along spine yellow TB 3 sec x 10  Row green TB 3 sec x 10  Bow and arrow TB 3 sec x 10  Manual Therapy: STM/MFR through Lt shoulder girdle Joint mobilization Lt shoulder in all planes pt supine and Rt sidelying  ROM/stretch Lt shoulder  Neuromuscular re-ed: Postural correction engaging posterior shoulder girdle musculature  Modalities: Ice Lt shoulder x 10 min  Self Care: Myofacial ball release work for home   [facility] Adult PT Treatment:                                                DATE: 01/05/23 Therapeutic Exercise: Review HEP  Shoulder flexion stepping back at counter Pec stretch in doorway  IR assisting with opposite UE ER with cane  Chin tuck Chest lift L's  W's  Manual  Therapy: STM/MFR through Lt shoulder girdle Joint mobilization Lt shoulder in all planes pt supine and Rt sidelying  ROM/stretch Lt shoulder  Neuromuscular re-ed: Postural correction engaging posterior shoulder girdle musculature  Modalities: Moist heat Lt shoulder x 10 min  Self Care: Myofacial ball release work   [facility] Adult PT Treatment:                                                DATE: 01/04/23 Therapeutic Exercise: Review HEP  Shoulder flexion stepping back at counter Pec stretch in doorway  IR assisting with opposite UE ER with cane  Manual Therapy: STM/MFR through Lt shoulder girdle Joint mobilization Lt shoulder in all planes  ROM/stretch Lt shoulder  Neuromuscular re-ed: Postural correction engaging posterior shoulder girdle musculature  Modalities: Ice Lt shoulder x 10 min  Self Care: Myofacial ball release work     PATIENT EDUCATION: Education details: POC; HEP Person educated: Patient Education method: Programmer, multimedia, Facilities manager, Actor cues, Verbal cues, and Handouts Education comprehension: verbalized understanding, returned demonstration, verbal cues required, tactile cues required, and needs further education  HOME EXERCISE PROGRAM:  E-mailed to: [email or web address]  Access Code: [email or web address] URL: [email or web address] Date: 01/11/2023 Prepared by: Corlis Leak  Exercises - Seated Shoulder Flexion AAROM with Pulley Behind  - 2 x daily - 7 x weekly - 1 sets - 10 reps - 10 sec  hold - Seated Shoulder Scaption AAROM with Pulley at Side  - 2 x daily - 7 x weekly - 1 sets - 10 reps - 10sec  hold - Seated Cervical Retraction  - 2 x daily - 7 x weekly - 1-2 sets - 5-10 reps - 10 sec  hold - Seated Scapular Retraction  - 2 x daily - 7 x weekly - 1-2 sets - 10 reps - 10 sec  hold - Standing Scapular Retraction  - 3 x daily - 7 x weekly - 1 sets - 10 reps - 10 sec  hold - Shoulder External Rotation and Scapular Retraction  - 3 x daily - 7 x weekly - 1  sets - 10 reps - 3-5 sec   hold - Seated Shoulder W  - 2 x daily - 7 x weekly - 1 sets - 10 reps - 3 sec  hold - Shoulder External Rotation and Scapular Retraction with Resistance  - 2 x daily - 7 x weekly - 1 sets - 10 reps - 3-5 sec  hold - Shoulder W - External Rotation with Resistance  - 2 x daily - 7 x weekly - 1-2 sets - 10 reps - 3 sec  hold - Standing Bilateral Low Shoulder Row with Anchored Resistance  - 2 x daily - 7 x weekly - 1-3 sets - 10 reps -  2-3 sec  hold - Drawing Bow  - 1 x daily - 7 x weekly - 1 sets - 10 reps - 3 sec  hold  ASSESSMENT:  CLINICAL IMPRESSION: Patient demonstrates increasing Active and PROM. She reports improving functional activities using the Lt UE. She does have continued soreness in shoulder.  Will benefit from continued trial of DN as well as continued manual work to address capsular and muscular tightness.   OBJECTIVE IMPAIRMENTS: Lt shoulder pain which started with lifting overhead her luggage when travelling. She has had gradually increasing pain and stiffness in the Lt shoulder. She received an injection ~ 1-2 weeks ago with some improvement. Patient presents with poor posture and alignment; limited A and PROM Lt shoulder; functional weakness due to pain; decreased functional activities and pain on a daily basis    GOALS: Goals reviewed with patient? Yes  SHORT TERM GOALS: Target date: 01/04/23  Independent in initial HEP  Baseline: Goal status: INITIAL  2.  Increase AROM shoulder flexion and abduction by 5-10 degrees  Baseline:  Goal status: INITIAL   LONG TERM GOALS: Target date: 03/29/2023   Decrease pain in the Lt shoulder by 75-100% allowing patient to return to normal functional activities  Baseline:  Goal status: INITIAL  2.  AROM Lt shoulder WFL's and pain free  Baseline:  Goal status: INITIAL  3.  Patient reports return to normal functional activities including lifting grandson with minimal to no increase in Lt shoulder pain   Baseline:  Goal status: INITIAL  4.  Independent in HEP  Baseline:  Goal status: INITIAL  PLAN:  PT FREQUENCY: 2x/week  PT DURATION: 8 weeks  PLANNED INTERVENTIONS: Therapeutic exercises, Therapeutic activity, Neuromuscular re-education, Patient/Family education, Self Care, Joint mobilization, Aquatic Therapy, Dry Needling, Electrical stimulation, Spinal mobilization, Cryotherapy, Moist heat, Taping, Vasopneumatic device, Ultrasound, Ionotophoresis 4mg /ml Dexamethasone, Manual therapy, and Re-evaluation  PLAN FOR NEXT SESSION: review and progress exercises; continue with postural correction and education; manual work, DN, modalities as indicated    [employer], PT 01/11/2023, 11:02 AM

## 2023-01-12 ENCOUNTER — Ambulatory Visit: Payer: BC Managed Care – PPO | Admitting: Rehabilitative and Restorative Service Providers"

## 2023-01-12 ENCOUNTER — Encounter: Payer: Self-pay | Admitting: Rehabilitative and Restorative Service Providers"

## 2023-01-12 DIAGNOSIS — M25512 Pain in left shoulder: Secondary | ICD-10-CM | POA: Diagnosis not present

## 2023-01-12 DIAGNOSIS — G8929 Other chronic pain: Secondary | ICD-10-CM

## 2023-01-12 DIAGNOSIS — M6281 Muscle weakness (generalized): Secondary | ICD-10-CM

## 2023-01-12 DIAGNOSIS — R29898 Other symptoms and signs involving the musculoskeletal system: Secondary | ICD-10-CM | POA: Diagnosis not present

## 2023-01-12 DIAGNOSIS — M25511 Pain in right shoulder: Secondary | ICD-10-CM | POA: Diagnosis not present

## 2023-01-12 NOTE — Therapy (Signed)
OUTPATIENT PHYSICAL THERAPY SHOULDER TREATMENT   Patient Name: Lori King MRN: [id number] DOB:[birth year], 57 y.o., female Today's Date: 01/12/2023  END OF SESSION:  PT End of Session - 01/12/23 1025     Visit Number 4    Number of Visits 24    Authorization Type BCBS [id number]    PT Start Time 1020    PT Stop Time 1105    PT Time Calculation (min) 45 min    Activity Tolerance Patient tolerated treatment well             Past Medical History:  Diagnosis Date   Adhesive capsulitis of right shoulder    Allergic rhinitis, seasonal    Anemia    Anxiety    MDD (major depressive disorder)    Wears contact lenses    Past Surgical History:  Procedure Laterality Date   BREAST BIOPSY Left 2010   x2 per pt benign   CESAREAN SECTION  X2  last one 02-28-2001   W/  BILATERAL TUBAL LIGATION w/ last c/s   COLONOSCOPY  11/01/2017   HYSTEROSCOPY W/ ENDOMETRIAL ABLATION  2019   LUMBAR LAMINECTOMY/DECOMPRESSION MICRODISCECTOMY  2013   L4  --- S1   SHOULDER ARTHROSCOPY Right 08/28/2020   Procedure: ARTHROSCOPY SHOULDER lysis of adhesions and manipulation under anesthesia, extensive debridement;  Surgeon: Yolonda Kida, MD;  Location: [facility];  Service: Orthopedics;  Laterality: Right;   There are no problems to display for this patient.   PCP: Dr Angelica Chessman  REFERRING PROVIDER: Dr Arlyce Harman  REFERRING DIAG: pain of Lt shoulder  THERAPY DIAG:  Acute pain of left shoulder  Muscle weakness (generalized)  Other symptoms and signs involving the musculoskeletal system  Chronic right shoulder pain  Rationale for Evaluation and Treatment: Rehabilitation  ONSET DATE: 10/24/22  SUBJECTIVE:                                                                                                                                                                                      SUBJECTIVE STATEMENT: Patient reports that she has some continued  soreness in the Lt shoulder from exercises and maybe from the weather. MD appt yesterday went well and he does not feel she will need surgery for Lt shoulder. Intensity of pain is decreased and stabbing pain is no longer there. ROM is getting better.   EVAL: Patient reports that she was lifting her luggage out of the overhead bin when she felt pain in the Lt shoulder and has had some pain in an intermittent basis. She had an injection in Lt shoulder 12/22/22 with some improvement but shoulder is stiff and  painful.  Hand dominance: Right  PERTINENT HISTORY: Rt shoulder pain 3 yrs with shoulder surgery for labrum tear and biceps tendon; adhesive capsulitis; reactive hypoglycemic disease; fatty liver  PAIN:  Are you having pain? Yes: NPRS scale: 5/10 with movement; 2/10 at rest; at worst 6/10 Pain location: Lt shoulder  Pain description: sharp; stabbing pain in biceps radiating to elbow  Aggravating factors: reaching overhead to the side and behind  Relieving factors: ice; TENs unit   PRECAUTIONS: None  WEIGHT BEARING RESTRICTIONS: No  FALLS:  Has patient fallen in last 6 months? No   PATIENT GOALS:get rid of the shoulder pain and increase ROM   NEXT MD VISIT: Dr Karen Chafe 01/11/23; (Dr Aundria Rud next week)   OBJECTIVE:   DIAGNOSTIC FINDINGS:  None available for shoulder; x-ray (-)    COGNITION: Overall cognitive status: Within functional limits for tasks assessed     POSTURE: Patient presents with head forward posture with increased thoracic kyphosis; shoulders rounded and elevated; scapulae abducted and rotated along the thoracic spine; head of the humerus anterior in orientation.   UPPER EXTREMITY ROM:   Active ROM Right eval Left eval Left  01/11/23  Shoulder flexion 160 112 130  Shoulder extension 59 25 48  Shoulder abduction 168 112 133  Shoulder adduction     Shoulder internal rotation T7 Thumb to waist Thumb  T12  Shoulder external rotation 95 85 93  Elbow flexion      Elbow extension     Wrist flexion     Wrist extension     Wrist ulnar deviation     Wrist radial deviation     Wrist pronation     Wrist supination     (Blank rows = not tested) PASSIVE ROM - supine flexion 152; scaption 147; ER 90  UPPER EXTREMITY MMT: strength not assessed - moves Lt UE well against gravity; limited by pain  MMT Right eval Left eval  Shoulder flexion    Shoulder extension    Shoulder abduction    Shoulder adduction    Shoulder internal rotation    Shoulder external rotation    Middle trapezius    Lower trapezius    Elbow flexion    Elbow extension    Wrist flexion    Wrist extension    Wrist ulnar deviation    Wrist radial deviation    Wrist pronation    Wrist supination    Grip strength (lbs)    (Blank rows = not tested)  JOINT MOBILITY TESTING:  Significant joint tightness Lt shoulder in all planes   PALPATION:  Muscular tightness Lt shoulder through the upper trap; pecs; leveator; teres   [facility] Adult PT Treatment:                                                DATE: 01/12/23 Therapeutic Exercise: Review HEP  Shoulder flexion rolling ball on table 10 sec x 10   Shoulder flexion rolling ball on wall stepping 10 sec x 10  Pec stretch in doorway  IR assisting with opposite UE ER with cane  Chin tuck Chest lift L's noodle along spine yellow TB 3 sec x 10  W's noodle along spine yellow TB 3 sec x 10  Row green TB 3 sec x 10  Bow and arrow TB 3 sec x 10  Manual Therapy: STM/MFR  through Lt shoulder girdle Joint mobilization Lt shoulder in all planes pt supine and Rt sidelying  ROM/stretch Lt shoulder  Trigger Point Dry-Needling  Treatment instructions: Expect mild to moderate muscle soreness. S/S of pneumothorax if dry needled over a lung field, and to seek immediate medical attention should they occur. Patient verbalized understanding of these instructions and education.  Patient Consent Given: Yes Education handout provided: Previously  provided Muscles treated: pecs  Electrical stimulation performed: No Parameters: N/A Treatment response/outcome: decreased palpable tightness   Neuromuscular re-ed: Postural correction engaging posterior shoulder girdle musculature  Modalities: Moist heat Lt shoulder x 10 min  Self Care: Myofacial ball release work for home   [facility] Adult PT Treatment:                                                DATE: 01/11/23 Therapeutic Exercise: Review HEP  Shoulder flexion stepping back at counter Pec stretch in doorway  IR assisting with opposite UE ER with cane  Chin tuck Chest lift L's noodle along spine yellow TB 3 sec x 10  W's noodle along spine yellow TB 3 sec x 10  Row green TB 3 sec x 10  Bow and arrow TB 3 sec x 10  Manual Therapy: STM/MFR through Lt shoulder girdle Joint mobilization Lt shoulder in all planes pt supine and Rt sidelying  ROM/stretch Lt shoulder  Neuromuscular re-ed: Postural correction engaging posterior shoulder girdle musculature  Modalities: Ice Lt shoulder x 10 min  Self Care: Myofacial ball release work for home   [facility] Adult PT Treatment:                                                DATE: 01/05/23 Therapeutic Exercise: Review HEP  Shoulder flexion stepping back at counter Pec stretch in doorway  IR assisting with opposite UE ER with cane  Chin tuck Chest lift L's  W's  Manual Therapy: STM/MFR through Lt shoulder girdle Joint mobilization Lt shoulder in all planes pt supine and Rt sidelying  ROM/stretch Lt shoulder  Neuromuscular re-ed: Postural correction engaging posterior shoulder girdle musculature  Modalities: Moist heat Lt shoulder x 10 min  Self Care: Myofacial ball release work      PATIENT EDUCATION: Education details: POC; HEP Person educated: Patient Education method: Programmer, multimedia, Facilities manager, Actor cues, Verbal cues, and Handouts Education comprehension: verbalized understanding, returned demonstration, verbal cues  required, tactile cues required, and needs further education  HOME EXERCISE PROGRAM:  E-mailed to: [email or web address]  Access Code: [email or web address] URL: [email or web address] Date: 01/11/2023 Prepared by: Corlis Leak  Exercises - Seated Shoulder Flexion AAROM with Pulley Behind  - 2 x daily - 7 x weekly - 1 sets - 10 reps - 10 sec  hold - Seated Shoulder Scaption AAROM with Pulley at Side  - 2 x daily - 7 x weekly - 1 sets - 10 reps - 10sec  hold - Seated Cervical Retraction  - 2 x daily - 7 x weekly - 1-2 sets - 5-10 reps - 10 sec  hold - Seated Scapular Retraction  - 2 x daily - 7 x weekly - 1-2 sets - 10 reps - 10 sec  hold - Standing Scapular Retraction  - 3 x  daily - 7 x weekly - 1 sets - 10 reps - 10 sec  hold - Shoulder External Rotation and Scapular Retraction  - 3 x daily - 7 x weekly - 1 sets - 10 reps - 3-5 sec   hold - Seated Shoulder W  - 2 x daily - 7 x weekly - 1 sets - 10 reps - 3 sec  hold - Shoulder External Rotation and Scapular Retraction with Resistance  - 2 x daily - 7 x weekly - 1 sets - 10 reps - 3-5 sec  hold - Shoulder W - External Rotation with Resistance  - 2 x daily - 7 x weekly - 1-2 sets - 10 reps - 3 sec  hold - Standing Bilateral Low Shoulder Row with Anchored Resistance  - 2 x daily - 7 x weekly - 1-3 sets - 10 reps - 2-3 sec  hold - Drawing Bow  - 1 x daily - 7 x weekly - 1 sets - 10 reps - 3 sec  hold  ASSESSMENT:  CLINICAL IMPRESSION: Patient demonstrates and reports increasing ROM. She reports improving functional activities using the Lt UE. She does have continued soreness in shoulder.  Will benefit from continued trial of DN as well as continued manual work to address capsular and muscular tightness.   OBJECTIVE IMPAIRMENTS: Lt shoulder pain which started with lifting overhead her luggage when travelling. She has had gradually increasing pain and stiffness in the Lt shoulder. She received an injection ~ 1-2 weeks ago with some improvement.  Patient presents with poor posture and alignment; limited A and PROM Lt shoulder; functional weakness due to pain; decreased functional activities and pain on a daily basis    GOALS: Goals reviewed with patient? Yes  SHORT TERM GOALS: Target date: 01/04/23  Independent in initial HEP  Baseline: Goal status: INITIAL  2.  Increase AROM shoulder flexion and abduction by 5-10 degrees  Baseline:  Goal status: INITIAL   LONG TERM GOALS: Target date: 03/29/2023   Decrease pain in the Lt shoulder by 75-100% allowing patient to return to normal functional activities  Baseline:  Goal status: INITIAL  2.  AROM Lt shoulder WFL's and pain free  Baseline:  Goal status: INITIAL  3.  Patient reports return to normal functional activities including lifting grandson with minimal to no increase in Lt shoulder pain  Baseline:  Goal status: INITIAL  4.  Independent in HEP  Baseline:  Goal status: INITIAL  PLAN:  PT FREQUENCY: 2x/week  PT DURATION: 8 weeks  PLANNED INTERVENTIONS: Therapeutic exercises, Therapeutic activity, Neuromuscular re-education, Patient/Family education, Self Care, Joint mobilization, Aquatic Therapy, Dry Needling, Electrical stimulation, Spinal mobilization, Cryotherapy, Moist heat, Taping, Vasopneumatic device, Ultrasound, Ionotophoresis 4mg /ml Dexamethasone, Manual therapy, and Re-evaluation  PLAN FOR NEXT SESSION: review and progress exercises; continue with postural correction and education; manual work, DN, modalities as indicated    [employer], PT 01/12/2023, 10:26 AM

## 2023-01-13 ENCOUNTER — Encounter: Payer: Self-pay | Admitting: Rehabilitative and Restorative Service Providers"

## 2023-01-13 ENCOUNTER — Ambulatory Visit: Payer: BC Managed Care – PPO | Admitting: Rehabilitative and Restorative Service Providers"

## 2023-01-13 DIAGNOSIS — R29898 Other symptoms and signs involving the musculoskeletal system: Secondary | ICD-10-CM | POA: Diagnosis not present

## 2023-01-13 DIAGNOSIS — M25512 Pain in left shoulder: Secondary | ICD-10-CM | POA: Diagnosis not present

## 2023-01-13 DIAGNOSIS — G8929 Other chronic pain: Secondary | ICD-10-CM

## 2023-01-13 DIAGNOSIS — M6281 Muscle weakness (generalized): Secondary | ICD-10-CM

## 2023-01-13 DIAGNOSIS — M25511 Pain in right shoulder: Secondary | ICD-10-CM | POA: Diagnosis not present

## 2023-01-13 NOTE — Therapy (Signed)
OUTPATIENT PHYSICAL THERAPY SHOULDER TREATMENT   Patient Name: Lori King MRN: [id number] DOB:September 14, 1965, 57 y.o., female Today's Date: 01/13/2023  END OF SESSION:  PT End of Session - 01/13/23 1616     Visit Number 5    Number of Visits 24    Date for PT Re-Evaluation 03/29/23    Authorization Type BC[employer] [id number]    PT Start Time 1615    PT Stop Time 1700    PT Time Calculation (min) 45 min             Past Medical History:  Diagnosis Date   Adhesive capsulitis of right shoulder    Allergic rhinitis, seasonal    Anemia    Anxiety    MDD (major depressive disorder)    Wears contact lenses    Past Surgical History:  Procedure Laterality Date   BREAST BIOPSY Left 2010   x2 per pt benign   CESAREAN SECTION  X2  last one 02-28-2001   W/  BILATERAL TUBAL LIGATION w/ last c/s   COLONOSCOPY  11/01/2017   HYSTEROSCOPY W/ ENDOMETRIAL ABLATION  2019   LUMBAR LAMINECTOMY/DECOMPRESSION MICRODISCECTOMY  2013   L4  --- S1   SHOULDER ARTHROSCOPY Right 08/28/2020   Procedure: ARTHROSCOPY SHOULDER lysis of adhesions and manipulation under anesthesia, extensive debridement;  Surgeon: Yolonda Kida, MD;  Location: [facility] [place];  Service: Orthopedics;  Laterality: Right;   There are no problems to display for this patient.   PCP: Dr Angelica Chessman  REFERRING PROVIDER: Dr Arlyce Harman  REFERRING DIAG: pain of Lt shoulder  THERAPY DIAG:  Acute pain of left shoulder  Muscle weakness (generalized)  Other symptoms and signs involving the musculoskeletal system  Chronic right shoulder pain  Rationale for Evaluation and Treatment: Rehabilitation  ONSET DATE: 10/24/22  SUBJECTIVE:                                                                                                                                                                                      SUBJECTIVE STATEMENT: Patient reports that she has some continued soreness in the Lt  shoulder but decreased intensity of pain. Using TENS unit and working on exercises at home.   EVAL: Patient reports that she was lifting her luggage out of the overhead bin when she felt pain in the Lt shoulder and has had some pain in an intermittent basis. She had an injection in Lt shoulder 12/22/22 with some improvement but shoulder is stiff and painful.  Hand dominance: Right  PERTINENT HISTORY: Rt shoulder pain 3 yrs with shoulder surgery for labrum tear and biceps tendon; adhesive capsulitis; reactive hypoglycemic disease;  fatty liver  PAIN:  Are you having pain? Yes: NPRS scale: 2/10 with movement; 2/10 at rest; at worst 6/10 Pain location: Lt shoulder  Pain description: sharp; stabbing pain in biceps radiating to elbow with big reaching to the side  Aggravating factors: reaching overhead to the side and behind  Relieving factors: ice; TENs unit   PRECAUTIONS: None  WEIGHT BEARING RESTRICTIONS: No  FALLS:  Has patient fallen in last 6 months? No   PATIENT GOALS:get rid of the shoulder pain and increase ROM   NEXT MD VISIT: Dr Karen Chafe 01/11/23; (Dr Aundria Rud next week)   OBJECTIVE:   DIAGNOSTIC FINDINGS:  None available for shoulder; x-ray (-)    COGNITION: Overall cognitive status: Within functional limits for tasks assessed     POSTURE: Patient presents with head forward posture with increased thoracic kyphosis; shoulders rounded and elevated; scapulae abducted and rotated along the thoracic spine; head of the humerus anterior in orientation.   UPPER EXTREMITY ROM:   Active ROM Right eval Left eval Left  01/11/23  Shoulder flexion 160 112 130  Shoulder extension 59 25 48  Shoulder abduction 168 112 133  Shoulder adduction     Shoulder internal rotation T7 Thumb to waist Thumb  T12  Shoulder external rotation 95 85 93  Elbow flexion     Elbow extension     Wrist flexion     Wrist extension     Wrist ulnar deviation     Wrist radial deviation     Wrist  pronation     Wrist supination     (Blank rows = not tested) PASSIVE ROM - supine flexion 152; scaption 147; ER 90  UPPER EXTREMITY MMT: strength not assessed - moves Lt UE well against gravity; limited by pain  MMT Right eval Left eval  Shoulder flexion    Shoulder extension    Shoulder abduction    Shoulder adduction    Shoulder internal rotation    Shoulder external rotation    Middle trapezius    Lower trapezius    Elbow flexion    Elbow extension    Wrist flexion    Wrist extension    Wrist ulnar deviation    Wrist radial deviation    Wrist pronation    Wrist supination    Grip strength (lbs)    (Blank rows = not tested)  JOINT MOBILITY TESTING:  Significant joint tightness Lt shoulder in all planes   PALPATION:  Muscular tightness Lt shoulder through the upper trap; pecs; leveator; teres   [facility] Adult PT Treatment:                                                DATE: 01/13/23 Therapeutic Exercise: Sitting  Pulley flexion 10 sec x 10  Pulley scaption 10 sec x 10  Pulley horizontal ab/adduction x 10  Quadruped;  Cat cow 10 sec x 5 Child's pose 20 sec x 2  Manual Therapy:  Thoracic PA and lateral mobs Grade II/III pt prone  STM/MFR through thoracic paraspinals and into Lt shoulder girdle Joint mobilization Lt shoulder and PROM/stretching pt in supine   Modalities: Moist heat thoracic spine; cold pac Lt shoulder x 10 min   [facility] Adult PT Treatment:  DATE: 01/12/23 Therapeutic Exercise: Review HEP  Shoulder flexion rolling ball on table 10 sec x 10   Shoulder flexion rolling ball on wall stepping 10 sec x 10  Pec stretch in doorway  IR assisting with opposite UE ER with cane  Chin tuck Chest lift L's noodle along spine yellow TB 3 sec x 10  W's noodle along spine yellow TB 3 sec x 10  Row green TB 3 sec x 10  Bow and arrow TB 3 sec x 10  Manual Therapy: STM/MFR through Lt shoulder girdle Joint mobilization  Lt shoulder in all planes pt supine and Rt sidelying  ROM/stretch Lt shoulder  Trigger Point Dry-Needling  Treatment instructions: Expect mild to moderate muscle soreness. S/S of pneumothorax if dry needled over a lung field, and to seek immediate medical attention should they occur. Patient verbalized understanding of these instructions and education.  Patient Consent Given: Yes Education handout provided: Previously provided Muscles treated: pecs  Electrical stimulation performed: No Parameters: N/A Treatment response/outcome: decreased palpable tightness   Neuromuscular re-ed: Postural correction engaging posterior shoulder girdle musculature  Modalities: Moist heat Lt shoulder x 10 min  Self Care: Myofacial ball release work for home   [facility] Adult PT Treatment:                                                DATE: 01/11/23 Therapeutic Exercise: Review HEP  Shoulder flexion stepping back at counter Pec stretch in doorway  IR assisting with opposite UE ER with cane  Chin tuck Chest lift L's noodle along spine yellow TB 3 sec x 10  W's noodle along spine yellow TB 3 sec x 10  Row green TB 3 sec x 10  Bow and arrow TB 3 sec x 10  Manual Therapy: STM/MFR through Lt shoulder girdle Joint mobilization Lt shoulder in all planes pt supine and Rt sidelying  ROM/stretch Lt shoulder  Neuromuscular re-ed: Postural correction engaging posterior shoulder girdle musculature  Modalities: Ice Lt shoulder x 10 min  Self Care: Myofacial ball release work for home    PATIENT EDUCATION: Education details: POC; HEP Person educated: Patient Education method: Programmer, multimedia, Facilities manager, Actor cues, Verbal cues, and Handouts Education comprehension: verbalized understanding, returned demonstration, verbal cues required, tactile cues required, and needs further education  HOME EXERCISE PROGRAM:  E-mailed to: [email or web address]  Access Code: [email or web address] URL:  [email or web address] Date: 01/13/2023 Prepared by: Corlis Leak  Exercises - Seated Shoulder Flexion AAROM with Pulley Behind  - 2 x daily - 7 x weekly - 1 sets - 10 reps - 10 sec  hold - Seated Shoulder Scaption AAROM with Pulley at Side  - 2 x daily - 7 x weekly - 1 sets - 10 reps - 10sec  hold - Seated Cervical Retraction  - 2 x daily - 7 x weekly - 1-2 sets - 5-10 reps - 10 sec  hold - Seated Scapular Retraction  - 2 x daily - 7 x weekly - 1-2 sets - 10 reps - 10 sec  hold - Standing Scapular Retraction  - 3 x daily - 7 x weekly - 1 sets - 10 reps - 10 sec  hold - Shoulder External Rotation and Scapular Retraction  - 3 x daily - 7 x weekly - 1 sets - 10 reps - 3-5 sec   hold - Seated Shoulder W  -  2 x daily - 7 x weekly - 1 sets - 10 reps - 3 sec  hold - Shoulder External Rotation and Scapular Retraction with Resistance  - 2 x daily - 7 x weekly - 1 sets - 10 reps - 3-5 sec  hold - Shoulder W - External Rotation with Resistance  - 2 x daily - 7 x weekly - 1-2 sets - 10 reps - 3 sec  hold - Standing Bilateral Low Shoulder Row with Anchored Resistance  - 2 x daily - 7 x weekly - 1-3 sets - 10 reps - 2-3 sec  hold - Drawing Bow  - 1 x daily - 7 x weekly - 1 sets - 10 reps - 3 sec  hold - Prone Scapular Retraction  - 2 x daily - 7 x weekly - 1 sets - 5-10 reps - 3-5 sec  hold - Cat Cow  - 2 x daily - 7 x weekly - 1 sets - 5-10 reps - 3-5 sec  hold - Cat Cow to Child's Pose  - 2 x daily - 7 x weekly - 1 sets - 3 reps - 20-30 sec  hold  ASSESSMENT:  CLINICAL IMPRESSION: Progressing well with ROM. Patient reports decreasing pain and improving functional activities using the Lt UE. She does have continued soreness in shoulder.  Will benefit from continued trial of DN as well as manual work to address capsular and muscular tightness. Will progress with stretching and strengthening exercises for home.   OBJECTIVE IMPAIRMENTS: Lt shoulder pain which started with lifting overhead her  luggage when travelling. She has had gradually increasing pain and stiffness in the Lt shoulder. She received an injection ~ 1-2 weeks ago with some improvement. Patient presents with poor posture and alignment; limited A and PROM Lt shoulder; functional weakness due to pain; decreased functional activities and pain on a daily basis    GOALS: Goals reviewed with patient? Yes  SHORT TERM GOALS: Target date: 01/04/23  Independent in initial HEP  Baseline: Goal status: INITIAL  2.  Increase AROM shoulder flexion and abduction by 5-10 degrees  Baseline:  Goal status: INITIAL   LONG TERM GOALS: Target date: 03/29/2023   Decrease pain in the Lt shoulder by 75-100% allowing patient to return to normal functional activities  Baseline:  Goal status: INITIAL  2.  AROM Lt shoulder WFL's and pain free  Baseline:  Goal status: INITIAL  3.  Patient reports return to normal functional activities including lifting grandson with minimal to no increase in Lt shoulder pain  Baseline:  Goal status: INITIAL  4.  Independent in HEP  Baseline:  Goal status: INITIAL  PLAN:  PT FREQUENCY: 2x/week  PT DURATION: 8 weeks  PLANNED INTERVENTIONS: Therapeutic exercises, Therapeutic activity, Neuromuscular re-education, Patient/Family education, Self Care, Joint mobilization, Aquatic Therapy, Dry Needling, Electrical stimulation, Spinal mobilization, Cryotherapy, Moist heat, Taping, Vasopneumatic device, Ultrasound, Ionotophoresis 4mg /ml Dexamethasone, Manual therapy, and Re-evaluation  PLAN FOR NEXT SESSION: review and progress exercises; continue with postural correction and education; manual work, DN, modalities as indicated    [employer], PT 01/13/2023, 4:17 PM

## 2023-01-17 ENCOUNTER — Encounter: Payer: Self-pay | Admitting: Rehabilitative and Restorative Service Providers"

## 2023-01-17 ENCOUNTER — Ambulatory Visit: Payer: BC Managed Care – PPO | Admitting: Rehabilitative and Restorative Service Providers"

## 2023-01-17 DIAGNOSIS — M25512 Pain in left shoulder: Secondary | ICD-10-CM | POA: Diagnosis not present

## 2023-01-17 DIAGNOSIS — R29898 Other symptoms and signs involving the musculoskeletal system: Secondary | ICD-10-CM | POA: Diagnosis not present

## 2023-01-17 DIAGNOSIS — G8929 Other chronic pain: Secondary | ICD-10-CM

## 2023-01-17 DIAGNOSIS — M6281 Muscle weakness (generalized): Secondary | ICD-10-CM | POA: Diagnosis not present

## 2023-01-17 DIAGNOSIS — M25511 Pain in right shoulder: Secondary | ICD-10-CM | POA: Diagnosis not present

## 2023-01-17 NOTE — Therapy (Signed)
OUTPATIENT PHYSICAL THERAPY SHOULDER TREATMENT   Patient Name: Lori King MRN: [id number] DOB:Feb 24, 1966, 57 y.o., female Today's Date: 01/17/2023  END OF SESSION:  PT End of Session - 01/17/23 0911     Visit Number 6    Number of Visits 24    Date for PT Re-Evaluation 03/29/23    Authorization Type BCBS [id number]    PT Start Time 0845    PT Stop Time 0933    PT Time Calculation (min) 48 min    Activity Tolerance Patient tolerated treatment well             Past Medical History:  Diagnosis Date   Adhesive capsulitis of right shoulder    Allergic rhinitis, seasonal    Anemia    Anxiety    MDD (major depressive disorder)    Wears contact lenses    Past Surgical History:  Procedure Laterality Date   BREAST BIOPSY Left 2010   x2 per pt benign   CESAREAN SECTION  X2  last one 02-28-2001   W/  BILATERAL TUBAL LIGATION w/ last c/s   COLONOSCOPY  11/01/2017   HYSTEROSCOPY W/ ENDOMETRIAL ABLATION  2019   LUMBAR LAMINECTOMY/DECOMPRESSION MICRODISCECTOMY  2013   L4  --- S1   SHOULDER ARTHROSCOPY Right 08/28/2020   Procedure: ARTHROSCOPY SHOULDER lysis of adhesions and manipulation under anesthesia, extensive debridement;  Surgeon: Yolonda Kida, MD;  Location: [facility] [place];  Service: Orthopedics;  Laterality: Right;   There are no problems to display for this patient.   PCP: Dr Angelica Chessman  REFERRING PROVIDER: Dr Arlyce Harman  REFERRING DIAG: pain of Lt shoulder  THERAPY DIAG:  Acute pain of left shoulder  Muscle weakness (generalized)  Other symptoms and signs involving the musculoskeletal system  Chronic right shoulder pain  Rationale for Evaluation and Treatment: Rehabilitation  ONSET DATE: 10/24/22  SUBJECTIVE:                                                                                                                                                                                      SUBJECTIVE STATEMENT: Patient  reports that she has some continued soreness in the Lt shoulder but decreased intensity of pain. Using TENS unit and working on exercises at home.   EVAL: Patient reports that she was lifting her luggage out of the overhead bin when she felt pain in the Lt shoulder and has had some pain in an intermittent basis. She had an injection in Lt shoulder 12/22/22 with some improvement but shoulder is stiff and painful.  Hand dominance: Right  PERTINENT HISTORY: Rt shoulder pain 3 yrs with shoulder surgery for labrum  tear and biceps tendon; adhesive capsulitis; reactive hypoglycemic disease; fatty liver  PAIN:  Are you having pain? Yes: NPRS scale: 2/10 with movement; 2/10 at rest; at worst 6/10 Pain location: Lt shoulder  Pain description: sharp; stabbing pain in biceps radiating to elbow with big reaching to the side  Aggravating factors: reaching overhead to the side and behind  Relieving factors: ice; TENs unit   PRECAUTIONS: None  WEIGHT BEARING RESTRICTIONS: No  FALLS:  Has patient fallen in last 6 months? No   PATIENT GOALS:get rid of the shoulder pain and increase ROM   NEXT MD VISIT: Dr Karen Chafe 01/11/23; (Dr Aundria Rud next week)   OBJECTIVE:   DIAGNOSTIC FINDINGS:  None available for shoulder; x-ray (-)    COGNITION: Overall cognitive status: Within functional limits for tasks assessed     POSTURE: Patient presents with head forward posture with increased thoracic kyphosis; shoulders rounded and elevated; scapulae abducted and rotated along the thoracic spine; head of the humerus anterior in orientation.   UPPER EXTREMITY ROM:   Active ROM Right eval Left eval Left  01/11/23  Shoulder flexion 160 112 130  Shoulder extension 59 25 48  Shoulder abduction 168 112 133  Shoulder adduction     Shoulder internal rotation T7 Thumb to waist Thumb  T12  Shoulder external rotation 95 85 93  Elbow flexion     Elbow extension     Wrist flexion     Wrist extension     Wrist ulnar  deviation     Wrist radial deviation     Wrist pronation     Wrist supination     (Blank rows = not tested) PASSIVE ROM - supine flexion 152; scaption 147; ER 90  UPPER EXTREMITY MMT: strength not assessed - moves Lt UE well against gravity; limited by pain  MMT Right eval Left eval  Shoulder flexion    Shoulder extension    Shoulder abduction    Shoulder adduction    Shoulder internal rotation    Shoulder external rotation    Middle trapezius    Lower trapezius    Elbow flexion    Elbow extension    Wrist flexion    Wrist extension    Wrist ulnar deviation    Wrist radial deviation    Wrist pronation    Wrist supination    Grip strength (lbs)    (Blank rows = not tested)  JOINT MOBILITY TESTING:  Significant joint tightness Lt shoulder in all planes   PALPATION:  Muscular tightness Lt shoulder through the upper trap; pecs; leveator; teres   [facility] Adult PT Treatment:                                                DATE: 01/17/23 Therapeutic Exercise: Standing Rolling ball into shoulder flexion  Sitting   Quadruped;  Cat cow 10 sec x 5 Child's pose 20 sec x 2  Thread the needle 20 sec x 2  Manual Therapy:  Thoracic PA and lateral mobs Grade II/III pt prone  STM/MFR through thoracic paraspinals and into Lt shoulder girdle Joint mobilization Lt shoulder and PROM/stretching pt in supine  Trigger Point Dry-Needling  Treatment instructions: Expect mild to moderate muscle soreness. S/S of pneumothorax if dry needled over a lung field, and to seek immediate medical attention should they occur. Patient verbalized understanding  of these instructions and education.  Patient Consent Given: Yes Education handout provided: Yes Muscles treated: Lt supraspinatus  Electrical stimulation performed: Yes Parameters: mAmp current; intensity to pt tolerance Treatment response/outcome: decreased palpable tightness   Modalities: Cold pac Lt shoulder x 10 min   [facility] Adult PT  Treatment:                                                DATE: 01/13/23 Therapeutic Exercise: Sitting  Pulley flexion 10 sec x 10  Pulley scaption 10 sec x 10  Pulley horizontal ab/adduction x 10  Quadruped;  Cat cow 10 sec x 5 Child's pose 20 sec x 2  Manual Therapy:  Thoracic PA and lateral mobs Grade II/III pt prone  STM/MFR through thoracic paraspinals and into Lt shoulder girdle Joint mobilization Lt shoulder and PROM/stretching pt in supine   Modalities: Moist heat thoracic spine; cold pac Lt shoulder x 10 min   [facility] Adult PT Treatment:                                                DATE: 01/12/23 Therapeutic Exercise: Review HEP  Shoulder flexion rolling ball on table 10 sec x 10   Shoulder flexion rolling ball on wall stepping 10 sec x 10  Pec stretch in doorway  IR assisting with opposite UE ER with cane  Chin tuck Chest lift L's noodle along spine yellow TB 3 sec x 10  W's noodle along spine yellow TB 3 sec x 10  Row green TB 3 sec x 10  Bow and arrow TB 3 sec x 10  Manual Therapy: STM/MFR through Lt shoulder girdle Joint mobilization Lt shoulder in all planes pt supine and Rt sidelying  ROM/stretch Lt shoulder  Trigger Point Dry-Needling  Treatment instructions: Expect mild to moderate muscle soreness. S/S of pneumothorax if dry needled over a lung field, and to seek immediate medical attention should they occur. Patient verbalized understanding of these instructions and education.  Patient Consent Given: Yes Education handout provided: Previously provided Muscles treated: pecs  Electrical stimulation performed: No Parameters: N/A Treatment response/outcome: decreased palpable tightness   Neuromuscular re-ed: Postural correction engaging posterior shoulder girdle musculature  Modalities: Moist heat Lt shoulder x 10 min  Self Care: Myofacial ball release work for home   [facility] Adult PT Treatment:                                                DATE:  01/11/23 Therapeutic Exercise: Review HEP  Shoulder flexion stepping back at counter Pec stretch in doorway  IR assisting with opposite UE ER with cane  Chin tuck Chest lift L's noodle along spine yellow TB 3 sec x 10  W's noodle along spine yellow TB 3 sec x 10  Row green TB 3 sec x 10  Bow and arrow TB 3 sec x 10  Manual Therapy: STM/MFR through Lt shoulder girdle Joint mobilization Lt shoulder in all planes pt supine and Rt sidelying  ROM/stretch Lt shoulder  Neuromuscular re-ed: Postural correction engaging posterior shoulder girdle musculature  Modalities: Ice Lt shoulder x 10 min  Self Care: Myofacial ball release work for home    PATIENT EDUCATION: Education details: POC; HEP Person educated: Patient Education method: Programmer, multimedia, Facilities manager, Actor cues, Verbal cues, and Handouts Education comprehension: verbalized understanding, returned demonstration, verbal cues required, tactile cues required, and needs further education  HOME EXERCISE PROGRAM:  E-mailed to: [email or web address]  Access Code: [email or web address] URL: [email or web address] Date: 01/13/2023 Prepared by: Corlis Leak  Exercises - Seated Shoulder Flexion AAROM with Pulley Behind  - 2 x daily - 7 x weekly - 1 sets - 10 reps - 10 sec  hold - Seated Shoulder Scaption AAROM with Pulley at Side  - 2 x daily - 7 x weekly - 1 sets - 10 reps - 10sec  hold - Seated Cervical Retraction  - 2 x daily - 7 x weekly - 1-2 sets - 5-10 reps - 10 sec  hold - Seated Scapular Retraction  - 2 x daily - 7 x weekly - 1-2 sets - 10 reps - 10 sec  hold - Standing Scapular Retraction  - 3 x daily - 7 x weekly - 1 sets - 10 reps - 10 sec  hold - Shoulder External Rotation and Scapular Retraction  - 3 x daily - 7 x weekly - 1 sets - 10 reps - 3-5 sec   hold - Seated Shoulder W  - 2 x daily - 7 x weekly - 1 sets - 10 reps - 3 sec  hold - Shoulder External Rotation and Scapular Retraction with Resistance  - 2 x daily - 7 x  weekly - 1 sets - 10 reps - 3-5 sec  hold - Shoulder W - External Rotation with Resistance  - 2 x daily - 7 x weekly - 1-2 sets - 10 reps - 3 sec  hold - Standing Bilateral Low Shoulder Row with Anchored Resistance  - 2 x daily - 7 x weekly - 1-3 sets - 10 reps - 2-3 sec  hold - Drawing Bow  - 1 x daily - 7 x weekly - 1 sets - 10 reps - 3 sec  hold - Prone Scapular Retraction  - 2 x daily - 7 x weekly - 1 sets - 5-10 reps - 3-5 sec  hold - Cat Cow  - 2 x daily - 7 x weekly - 1 sets - 5-10 reps - 3-5 sec  hold - Cat Cow to Child's Pose  - 2 x daily - 7 x weekly - 1 sets - 3 reps - 20-30 sec  hold  ASSESSMENT:  CLINICAL IMPRESSION: Increased tightness in the Lt shoulder from reaching up into the closet. Overall progressing well with ROM. Patient reports decreasing pain and improving functional activities using the Lt UE. She does have continued soreness in shoulder.  Will benefit from continued trial of DN as well as manual work to address capsular and muscular tightness. Will progress with stretching and strengthening exercises for home.   OBJECTIVE IMPAIRMENTS: Lt shoulder pain which started with lifting overhead her luggage when travelling. She has had gradually increasing pain and stiffness in the Lt shoulder. She received an injection ~ 1-2 weeks ago with some improvement. Patient presents with poor posture and alignment; limited A and PROM Lt shoulder; functional weakness due to pain; decreased functional activities and pain on a daily basis    GOALS: Goals reviewed with patient? Yes  SHORT TERM GOALS: Target date: 01/04/23  Independent in initial HEP  Baseline: Goal status: INITIAL  2.  Increase AROM shoulder flexion and abduction by 5-10 degrees  Baseline:  Goal status: INITIAL   LONG TERM GOALS: Target date: 03/29/2023   Decrease pain in the Lt shoulder by 75-100% allowing patient to return to normal functional activities  Baseline:  Goal status: INITIAL  2.  AROM Lt shoulder  WFL's and pain free  Baseline:  Goal status: INITIAL  3.  Patient reports return to normal functional activities including lifting grandson with minimal to no increase in Lt shoulder pain  Baseline:  Goal status: INITIAL  4.  Independent in HEP  Baseline:  Goal status: INITIAL  PLAN:  PT FREQUENCY: 2x/week  PT DURATION: 8 weeks  PLANNED INTERVENTIONS: Therapeutic exercises, Therapeutic activity, Neuromuscular re-education, Patient/Family education, Self Care, Joint mobilization, Aquatic Therapy, Dry Needling, Electrical stimulation, Spinal mobilization, Cryotherapy, Moist heat, Taping, Vasopneumatic device, Ultrasound, Ionotophoresis 4mg /ml Dexamethasone, Manual therapy, and Re-evaluation  PLAN FOR NEXT SESSION: review and progress exercises; continue with postural correction and education; manual work, DN, modalities as indicated    [employer], PT 01/17/2023, 9:12 AM

## 2023-01-18 ENCOUNTER — Encounter: Payer: Self-pay | Admitting: Rehabilitative and Restorative Service Providers"

## 2023-01-18 ENCOUNTER — Ambulatory Visit: Payer: BC Managed Care – PPO | Admitting: Rehabilitative and Restorative Service Providers"

## 2023-01-18 DIAGNOSIS — M25512 Pain in left shoulder: Secondary | ICD-10-CM | POA: Diagnosis not present

## 2023-01-18 DIAGNOSIS — M6281 Muscle weakness (generalized): Secondary | ICD-10-CM

## 2023-01-18 DIAGNOSIS — F329 Major depressive disorder, single episode, unspecified: Secondary | ICD-10-CM | POA: Diagnosis not present

## 2023-01-18 DIAGNOSIS — R29898 Other symptoms and signs involving the musculoskeletal system: Secondary | ICD-10-CM

## 2023-01-18 DIAGNOSIS — G8929 Other chronic pain: Secondary | ICD-10-CM

## 2023-01-18 DIAGNOSIS — M25511 Pain in right shoulder: Secondary | ICD-10-CM | POA: Diagnosis not present

## 2023-01-18 NOTE — Therapy (Signed)
OUTPATIENT PHYSICAL THERAPY SHOULDER TREATMENT   Patient Name: Lori King MRN: [id number] DOB:May 05, 1966, 57 y.o., female Today's Date: 01/18/2023  END OF SESSION:  PT End of Session - 01/18/23 0854     Visit Number 7    Number of Visits 24    Date for PT Re-Evaluation 03/29/23    Authorization Type BCBS [id number]    PT Start Time 0845    PT Stop Time 0934    PT Time Calculation (min) 49 min    Activity Tolerance Patient tolerated treatment well             Past Medical History:  Diagnosis Date   Adhesive capsulitis of right shoulder    Allergic rhinitis, seasonal    Anemia    Anxiety    MDD (major depressive disorder)    Wears contact lenses    Past Surgical History:  Procedure Laterality Date   BREAST BIOPSY Left 2010   x2 per pt benign   CESAREAN SECTION  X2  last one 02-28-2001   W/  BILATERAL TUBAL LIGATION w/ last c/s   COLONOSCOPY  11/01/2017   HYSTEROSCOPY W/ ENDOMETRIAL ABLATION  2019   LUMBAR LAMINECTOMY/DECOMPRESSION MICRODISCECTOMY  2013   L4  --- S1   SHOULDER ARTHROSCOPY Right 08/28/2020   Procedure: ARTHROSCOPY SHOULDER lysis of adhesions and manipulation under anesthesia, extensive debridement;  Surgeon: Yolonda Kida, MD;  Location: [facility] [place];  Service: Orthopedics;  Laterality: Right;   There are no problems to display for this patient.   PCP: Dr Angelica Chessman  REFERRING PROVIDER: Dr Arlyce Harman  REFERRING DIAG: pain of Lt shoulder  THERAPY DIAG:  Acute pain of left shoulder  Muscle weakness (generalized)  Other symptoms and signs involving the musculoskeletal system  Chronic right shoulder pain  Rationale for Evaluation and Treatment: Rehabilitation  ONSET DATE: 10/24/22  SUBJECTIVE:                                                                                                                                                                                      SUBJECTIVE STATEMENT: Patient  reports that she has some continued some increased soreness and pain in the Lt shoulder. She did not sleep well. Using TENS unit and working on exercises at home.   EVAL: Patient reports that she was lifting her luggage out of the overhead bin when she felt pain in the Lt shoulder and has had some pain in an intermittent basis. She had an injection in Lt shoulder 12/22/22 with some improvement but shoulder is stiff and painful.  Hand dominance: Right  PERTINENT HISTORY: Rt shoulder pain 3 yrs with  shoulder surgery for labrum tear and biceps tendon; adhesive capsulitis; reactive hypoglycemic disease; fatty liver  PAIN:  Are you having pain? Yes: NPRS scale: 3/10 with movement; 2/10 at rest; with movement 5/10; at worst 6/10 Pain location: Lt shoulder  Pain description: sharp; stabbing pain in biceps radiating to elbow with big reaching to the side  Aggravating factors: reaching overhead to the side and behind  Relieving factors: ice; TENs unit   PRECAUTIONS: None  WEIGHT BEARING RESTRICTIONS: No  FALLS:  Has patient fallen in last 6 months? No   PATIENT GOALS:get rid of the shoulder pain and increase ROM   NEXT MD VISIT: Dr Karen Chafe 01/11/23; (Dr Aundria Rud next week)   OBJECTIVE:   DIAGNOSTIC FINDINGS:  None available for shoulder; x-ray (-)    COGNITION: Overall cognitive status: Within functional limits for tasks assessed     POSTURE: Patient presents with head forward posture with increased thoracic kyphosis; shoulders rounded and elevated; scapulae abducted and rotated along the thoracic spine; head of the humerus anterior in orientation.   UPPER EXTREMITY ROM:   Active ROM Right eval Left eval Left  01/11/23  Shoulder flexion 160 112 130  Shoulder extension 59 25 48  Shoulder abduction 168 112 133  Shoulder adduction     Shoulder internal rotation T7 Thumb to waist Thumb  T12  Shoulder external rotation 95 85 93  Elbow flexion     Elbow extension     Wrist flexion      Wrist extension     Wrist ulnar deviation     Wrist radial deviation     Wrist pronation     Wrist supination     (Blank rows = not tested) PASSIVE ROM - supine flexion 152; scaption 147; ER 90  UPPER EXTREMITY MMT: strength not assessed - moves Lt UE well against gravity; limited by pain  MMT Right eval Left eval  Shoulder flexion    Shoulder extension    Shoulder abduction    Shoulder adduction    Shoulder internal rotation    Shoulder external rotation    Middle trapezius    Lower trapezius    Elbow flexion    Elbow extension    Wrist flexion    Wrist extension    Wrist ulnar deviation    Wrist radial deviation    Wrist pronation    Wrist supination    Grip strength (lbs)    (Blank rows = not tested)  JOINT MOBILITY TESTING:  Significant joint tightness Lt shoulder in all planes   PALPATION:  Muscular tightness Lt shoulder through the upper trap; pecs; leveator; teres  [facility] Adult PT Treatment:                                                DATE: 01/18/23 Therapeutic Exercise: Sitting  Lateral cervical flexion stretch in chin tuck 10 sec x 5  Leveator stretch in chin tuck 10 sec x 5   Quadruped;   Manual Therapy:  IASTM pt supine working through the pecs; upper trap; leveator; biceps; deltoid   STM/MFR through thoracic paraspinals and into Lt shoulder girdle Joint mobilization Lt shoulder and PROM/stretching pt in supine   Modalities: Moist heat thoracic spine; cold pac Lt shoulder x 10 min    [facility] Adult PT Treatment:  DATE: 01/17/23 Therapeutic Exercise: Standing Rolling ball into shoulder flexion  Sitting   Quadruped;  Cat cow 10 sec x 5 Child's pose 20 sec x 2  Thread the needle 20 sec x 2  Manual Therapy:  Thoracic PA and lateral mobs Grade II/III pt prone  STM/MFR through thoracic paraspinals and into Lt shoulder girdle Joint mobilization Lt shoulder and PROM/stretching pt in supine  Trigger  Point Dry-Needling  Treatment instructions: Expect mild to moderate muscle soreness. S/S of pneumothorax if dry needled over a lung field, and to seek immediate medical attention should they occur. Patient verbalized understanding of these instructions and education.  Patient Consent Given: Yes Education handout provided: Yes Muscles treated: Lt supraspinatus  Electrical stimulation performed: Yes Parameters: mAmp current; intensity to pt tolerance Treatment response/outcome: decreased palpable tightness   Modalities: Cold pac Lt shoulder x 10 min   [facility] Adult PT Treatment:                                                DATE: 01/13/23 Therapeutic Exercise: Sitting  Pulley flexion 10 sec x 10  Pulley scaption 10 sec x 10  Pulley horizontal ab/adduction x 10  Quadruped;  Cat cow 10 sec x 5 Child's pose 20 sec x 2  Manual Therapy:  Thoracic PA and lateral mobs Grade II/III pt prone  STM/MFR through thoracic paraspinals and into Lt shoulder girdle Joint mobilization Lt shoulder and PROM/stretching pt in supine   Modalities: Moist heat thoracic spine; cold pac Lt shoulder x 10 min   [facility] Adult PT Treatment:                                                DATE: 01/12/23 Therapeutic Exercise: Review HEP  Shoulder flexion rolling ball on table 10 sec x 10   Shoulder flexion rolling ball on wall stepping 10 sec x 10  Pec stretch in doorway  IR assisting with opposite UE ER with cane  Chin tuck Chest lift L's noodle along spine yellow TB 3 sec x 10  W's noodle along spine yellow TB 3 sec x 10  Row green TB 3 sec x 10  Bow and arrow TB 3 sec x 10  Manual Therapy: STM/MFR through Lt shoulder girdle Joint mobilization Lt shoulder in all planes pt supine and Rt sidelying  ROM/stretch Lt shoulder  Trigger Point Dry-Needling  Treatment instructions: Expect mild to moderate muscle soreness. S/S of pneumothorax if dry needled over a lung field, and to seek immediate medical attention  should they occur. Patient verbalized understanding of these instructions and education.  Patient Consent Given: Yes Education handout provided: Previously provided Muscles treated: pecs  Electrical stimulation performed: No Parameters: N/A Treatment response/outcome: decreased palpable tightness   Neuromuscular re-ed: Postural correction engaging posterior shoulder girdle musculature  Modalities: Moist heat Lt shoulder x 10 min  Self Care: Myofacial ball release work for home   [facility] Adult PT Treatment:  DATE: 01/11/23 Therapeutic Exercise: Review HEP  Shoulder flexion stepping back at counter Pec stretch in doorway  IR assisting with opposite UE ER with cane  Chin tuck Chest lift L's noodle along spine yellow TB 3 sec x 10  W's noodle along spine yellow TB 3 sec x 10  Row green TB 3 sec x 10  Bow and arrow TB 3 sec x 10  Manual Therapy: STM/MFR through Lt shoulder girdle Joint mobilization Lt shoulder in all planes pt supine and Rt sidelying  ROM/stretch Lt shoulder  Neuromuscular re-ed: Postural correction engaging posterior shoulder girdle musculature  Modalities: Ice Lt shoulder x 10 min  Self Care: Myofacial ball release work for home    PATIENT EDUCATION: Education details: POC; HEP Person educated: Patient Education method: Programmer, multimedia, Facilities manager, Actor cues, Verbal cues, and Handouts Education comprehension: verbalized understanding, returned demonstration, verbal cues required, tactile cues required, and needs further education  HOME EXERCISE PROGRAM:  E-mailed to: [email or web address]  Access Code: [email or web address] URL: [email or web address] Date: 01/13/2023 Prepared by: Corlis Leak  Exercises - Seated Shoulder Flexion AAROM with Pulley Behind  - 2 x daily - 7 x weekly - 1 sets - 10 reps - 10 sec  hold - Seated Shoulder Scaption AAROM with Pulley at Side  - 2 x daily - 7 x weekly - 1 sets - 10  reps - 10sec  hold - Seated Cervical Retraction  - 2 x daily - 7 x weekly - 1-2 sets - 5-10 reps - 10 sec  hold - Seated Scapular Retraction  - 2 x daily - 7 x weekly - 1-2 sets - 10 reps - 10 sec  hold - Standing Scapular Retraction  - 3 x daily - 7 x weekly - 1 sets - 10 reps - 10 sec  hold - Shoulder External Rotation and Scapular Retraction  - 3 x daily - 7 x weekly - 1 sets - 10 reps - 3-5 sec   hold - Seated Shoulder W  - 2 x daily - 7 x weekly - 1 sets - 10 reps - 3 sec  hold - Shoulder External Rotation and Scapular Retraction with Resistance  - 2 x daily - 7 x weekly - 1 sets - 10 reps - 3-5 sec  hold - Shoulder W - External Rotation with Resistance  - 2 x daily - 7 x weekly - 1-2 sets - 10 reps - 3 sec  hold - Standing Bilateral Low Shoulder Row with Anchored Resistance  - 2 x daily - 7 x weekly - 1-3 sets - 10 reps - 2-3 sec  hold - Drawing Bow  - 1 x daily - 7 x weekly - 1 sets - 10 reps - 3 sec  hold - Prone Scapular Retraction  - 2 x daily - 7 x weekly - 1 sets - 5-10 reps - 3-5 sec  hold - Cat Cow  - 2 x daily - 7 x weekly - 1 sets - 5-10 reps - 3-5 sec  hold - Cat Cow to Child's Pose  - 2 x daily - 7 x weekly - 1 sets - 3 reps - 20-30 sec  hold  ASSESSMENT:  CLINICAL IMPRESSION: Increased tightness and pain in the Lt shoulder from lifting grandson. Overall progressing well with ROM. Patient reports decreasing pain and improving functional activities using the Lt UE. She does have continued soreness in shoulder. Will benefit from continued trial of DN as well as manual work to address capsular and muscular  tightness. Will progress with stretching and strengthening exercises for home.   OBJECTIVE IMPAIRMENTS: Lt shoulder pain which started with lifting overhead her luggage when travelling. She has had gradually increasing pain and stiffness in the Lt shoulder. She received an injection ~ 1-2 weeks ago with some improvement. Patient presents with poor posture and alignment; limited A  and PROM Lt shoulder; functional weakness due to pain; decreased functional activities and pain on a daily basis    GOALS: Goals reviewed with patient? Yes  SHORT TERM GOALS: Target date: 01/04/23  Independent in initial HEP  Baseline: Goal status: INITIAL  2.  Increase AROM shoulder flexion and abduction by 5-10 degrees  Baseline:  Goal status: INITIAL   LONG TERM GOALS: Target date: 03/29/2023   Decrease pain in the Lt shoulder by 75-100% allowing patient to return to normal functional activities  Baseline:  Goal status: INITIAL  2.  AROM Lt shoulder WFL's and pain free  Baseline:  Goal status: INITIAL  3.  Patient reports return to normal functional activities including lifting grandson with minimal to no increase in Lt shoulder pain  Baseline:  Goal status: INITIAL  4.  Independent in HEP  Baseline:  Goal status: INITIAL  PLAN:  PT FREQUENCY: 2x/week  PT DURATION: 8 weeks  PLANNED INTERVENTIONS: Therapeutic exercises, Therapeutic activity, Neuromuscular re-education, Patient/Family education, Self Care, Joint mobilization, Aquatic Therapy, Dry Needling, Electrical stimulation, Spinal mobilization, Cryotherapy, Moist heat, Taping, Vasopneumatic device, Ultrasound, Ionotophoresis 4mg /ml Dexamethasone, Manual therapy, and Re-evaluation  PLAN FOR NEXT SESSION: review and progress exercises; continue with postural correction and education; manual work, DN, modalities as indicated    [employer], PT 01/18/2023, 8:54 AM

## 2023-01-19 ENCOUNTER — Encounter: Payer: Self-pay | Admitting: Rehabilitative and Restorative Service Providers"

## 2023-01-19 ENCOUNTER — Ambulatory Visit: Payer: BC Managed Care – PPO | Admitting: Rehabilitative and Restorative Service Providers"

## 2023-01-19 DIAGNOSIS — M6281 Muscle weakness (generalized): Secondary | ICD-10-CM | POA: Diagnosis not present

## 2023-01-19 DIAGNOSIS — M25512 Pain in left shoulder: Secondary | ICD-10-CM

## 2023-01-19 DIAGNOSIS — R29898 Other symptoms and signs involving the musculoskeletal system: Secondary | ICD-10-CM

## 2023-01-19 DIAGNOSIS — G8929 Other chronic pain: Secondary | ICD-10-CM

## 2023-01-19 DIAGNOSIS — M25511 Pain in right shoulder: Secondary | ICD-10-CM | POA: Diagnosis not present

## 2023-01-19 NOTE — Therapy (Signed)
OUTPATIENT PHYSICAL THERAPY SHOULDER TREATMENT   Patient Name: Lori King MRN: [id number] DOB:27-May-1966, 57 y.o., female Today's Date: 01/19/2023  END OF SESSION:  PT End of Session - 01/19/23 0856     Visit Number 8    Number of Visits 24    Date for PT Re-Evaluation 03/29/23    Authorization Type BCBS [id number]    PT Start Time 0845    PT Stop Time 0935    PT Time Calculation (min) 50 min    Activity Tolerance Patient tolerated treatment well             Past Medical History:  Diagnosis Date   Adhesive capsulitis of right shoulder    Allergic rhinitis, seasonal    Anemia    Anxiety    MDD (major depressive disorder)    Wears contact lenses    Past Surgical History:  Procedure Laterality Date   BREAST BIOPSY Left 2010   x2 per pt benign   CESAREAN SECTION  X2  last one 02-28-2001   W/  BILATERAL TUBAL LIGATION w/ last c/s   COLONOSCOPY  11/01/2017   HYSTEROSCOPY W/ ENDOMETRIAL ABLATION  2019   LUMBAR LAMINECTOMY/DECOMPRESSION MICRODISCECTOMY  2013   L4  --- S1   SHOULDER ARTHROSCOPY Right 08/28/2020   Procedure: ARTHROSCOPY SHOULDER lysis of adhesions and manipulation under anesthesia, extensive debridement;  Surgeon: Yolonda Kida, MD;  Location: [facility] [place];  Service: Orthopedics;  Laterality: Right;   There are no problems to display for this patient.   PCP: Dr Angelica Chessman  REFERRING PROVIDER: Dr Arlyce Harman  REFERRING DIAG: pain of Lt shoulder  THERAPY DIAG:  Acute pain of left shoulder  Muscle weakness (generalized)  Other symptoms and signs involving the musculoskeletal system  Chronic right shoulder pain  Rationale for Evaluation and Treatment: Rehabilitation  ONSET DATE: 10/24/22  SUBJECTIVE:                                                                                                                                                                                      SUBJECTIVE STATEMENT: Patient  reports that she has some continued soreness and pain in the Lt shoulder. Using TENS unit and working on exercises at home. [place] 10-12 days.  EVAL: Patient reports that she was lifting her luggage out of the overhead bin when she felt pain in the Lt shoulder and has had some pain in an intermittent basis. She had an injection in Lt shoulder 12/22/22 with some improvement but shoulder is stiff and painful.  Hand dominance: Right  PERTINENT HISTORY: Rt  shoulder pain 3 yrs with shoulder surgery for labrum tear and biceps tendon; adhesive capsulitis; reactive hypoglycemic disease; fatty liver  PAIN:  Are you having pain? Yes: NPRS scale: 2/10 with movement; 2/10 at rest; with movement 5/10; at worst 6/10 Pain location: Lt shoulder  Pain description: sharp; stabbing pain in biceps radiating to elbow with big reaching to the side  Aggravating factors: reaching overhead to the side and behind  Relieving factors: ice; TENs unit   PRECAUTIONS: None  WEIGHT BEARING RESTRICTIONS: No  FALLS:  Has patient fallen in last 6 months? No   PATIENT GOALS:get rid of the shoulder pain and increase ROM   NEXT MD VISIT: Dr Karen Chafe 01/11/23; (Dr Aundria Rud next week)   OBJECTIVE:   DIAGNOSTIC FINDINGS:  None available for shoulder; x-ray (-)    COGNITION: Overall cognitive status: Within functional limits for tasks assessed     POSTURE: Patient presents with head forward posture with increased thoracic kyphosis; shoulders rounded and elevated; scapulae abducted and rotated along the thoracic spine; head of the humerus anterior in orientation.   UPPER EXTREMITY ROM:   Active ROM Right eval Left eval Left  01/11/23  Shoulder flexion 160 112 130  Shoulder extension 59 25 48  Shoulder abduction 168 112 133  Shoulder adduction     Shoulder internal rotation T7 Thumb to waist Thumb  T12  Shoulder external rotation 95 85 93  Elbow flexion     Elbow extension      Wrist flexion     Wrist extension     Wrist ulnar deviation     Wrist radial deviation     Wrist pronation     Wrist supination     (Blank rows = not tested) PASSIVE ROM - supine flexion 152; scaption 147; ER 90  UPPER EXTREMITY MMT: strength not assessed - moves Lt UE well against gravity; limited by pain  MMT Right eval Left eval  Shoulder flexion    Shoulder extension    Shoulder abduction    Shoulder adduction    Shoulder internal rotation    Shoulder external rotation    Middle trapezius    Lower trapezius    Elbow flexion    Elbow extension    Wrist flexion    Wrist extension    Wrist ulnar deviation    Wrist radial deviation    Wrist pronation    Wrist supination    Grip strength (lbs)    (Blank rows = not tested)  JOINT MOBILITY TESTING:  Significant joint tightness Lt shoulder in all planes   PALPATION:  Muscular tightness Lt shoulder through the upper trap; pecs; leveator; teres  [facility] Adult PT Treatment:                                                DATE: 01/19/23 Therapeutic Exercise: Sitting  Lateral cervical flexion stretch in chin tuck 10 sec x 5  Leveator stretch in chin tuck 10 sec x 5   Quadruped;   Manual Therapy:  Pt prone and supine working through the pecs; upper trap; leveator; biceps; deltoid   STM/MFR through thoracic paraspinals and into Lt shoulder girdle Joint mobilization Lt shoulder and PROM/stretching pt in supine  Skilled palpation to assess response to DN and manual work  Psychiatrist  Treatment instructions: Expect mild to moderate muscle soreness.  S/S of pneumothorax if dry needled over a lung field, and to seek immediate medical attention should they occur. Patient verbalized understanding of these instructions and education.  Patient Consent Given: Yes Education handout provided: Previously provided Muscles treated: Lt infraspinatus Electrical stimulation performed: No Parameters: N/A Treatment  response/outcome: decreased palpable tightness noted following treatment    Modalities: Moist heat thoracic spine; cold pac Lt shoulder x 10 min  [facility] Adult PT Treatment:                                                DATE: 01/18/23 Therapeutic Exercise: Sitting  Lateral cervical flexion stretch in chin tuck 10 sec x 5  Levator stretch in chin tuck 10 sec x 5   Quadruped;   Manual Therapy:  IASTM pt supine working through the pecs; upper trap; leveator; biceps; deltoid   STM/MFR through thoracic paraspinals and into Lt shoulder girdle Joint mobilization Lt shoulder and PROM/stretching pt in supine   Modalities: Moist heat thoracic spine; cold pac Lt shoulder x 10 min       PATIENT EDUCATION: Education details: POC; HEP Person educated: Patient Education method: Programmer, multimedia, Facilities manager, Actor cues, Verbal cues, and Handouts Education comprehension: verbalized understanding, returned demonstration, verbal cues required, tactile cues required, and needs further education  HOME EXERCISE PROGRAM:  E-mailed to: [email or web address]  Access Code: [email or web address] URL: [email or web address] Date: 01/13/2023 Prepared by: Corlis Leak  Exercises - Seated Shoulder Flexion AAROM with Pulley Behind  - 2 x daily - 7 x weekly - 1 sets - 10 reps - 10 sec  hold - Seated Shoulder Scaption AAROM with Pulley at Side  - 2 x daily - 7 x weekly - 1 sets - 10 reps - 10sec  hold - Seated Cervical Retraction  - 2 x daily - 7 x weekly - 1-2 sets - 5-10 reps - 10 sec  hold - Seated Scapular Retraction  - 2 x daily - 7 x weekly - 1-2 sets - 10 reps - 10 sec  hold - Standing Scapular Retraction  - 3 x daily - 7 x weekly - 1 sets - 10 reps - 10 sec  hold - Shoulder External Rotation and Scapular Retraction  - 3 x daily - 7 x weekly - 1 sets - 10 reps - 3-5 sec   hold - Seated Shoulder W  - 2 x daily - 7 x weekly - 1 sets - 10 reps - 3 sec  hold - Shoulder External Rotation and Scapular  Retraction with Resistance  - 2 x daily - 7 x weekly - 1 sets - 10 reps - 3-5 sec  hold - Shoulder W - External Rotation with Resistance  - 2 x daily - 7 x weekly - 1-2 sets - 10 reps - 3 sec  hold - Standing Bilateral Low Shoulder Row with Anchored Resistance  - 2 x daily - 7 x weekly - 1-3 sets - 10 reps - 2-3 sec  hold - Drawing Bow  - 1 x daily - 7 x weekly - 1 sets - 10 reps - 3 sec  hold - Prone Scapular Retraction  - 2 x daily - 7 x weekly - 1 sets - 5-10 reps - 3-5 sec  hold - Cat Cow  - 2 x daily - 7 x weekly - 1 sets - 5-10 reps -  3-5 sec  hold - Cat Cow to Child's Pose  - 2 x daily - 7 x weekly - 1 sets - 3 reps - 20-30 sec  hold  ASSESSMENT:  CLINICAL IMPRESSION: Increased soreness, tightness and pain in the Lt shoulder. Overall progressing well with PROM. Patient reports decreasing pain and improving functional activities using the Lt UE. She does have continued soreness in shoulder and stiffness in the joint. Will benefit from continued DN as well as manual work to address capsular and muscular tightness. Will progress with stretching and strengthening exercises for home.   OBJECTIVE IMPAIRMENTS: Lt shoulder pain which started with lifting overhead her luggage when travelling. She has had gradually increasing pain and stiffness in the Lt shoulder. She received an injection ~ 1-2 weeks ago with some improvement. Patient presents with poor posture and alignment; limited A and PROM Lt shoulder; functional weakness due to pain; decreased functional activities and pain on a daily basis    GOALS: Goals reviewed with patient? Yes  SHORT TERM GOALS: Target date: 01/04/23  Independent in initial HEP  Baseline: Goal status: INITIAL  2.  Increase AROM shoulder flexion and abduction by 5-10 degrees  Baseline:  Goal status: INITIAL   LONG TERM GOALS: Target date: 03/29/2023   Decrease pain in the Lt shoulder by 75-100% allowing patient to return to normal functional activities   Baseline:  Goal status: INITIAL  2.  AROM Lt shoulder WFL's and pain free  Baseline:  Goal status: INITIAL  3.  Patient reports return to normal functional activities including lifting grandson with minimal to no increase in Lt shoulder pain  Baseline:  Goal status: INITIAL  4.  Independent in HEP  Baseline:  Goal status: INITIAL  PLAN:  PT FREQUENCY: 2x/week  PT DURATION: 8 weeks  PLANNED INTERVENTIONS: Therapeutic exercises, Therapeutic activity, Neuromuscular re-education, Patient/Family education, Self Care, Joint mobilization, Aquatic Therapy, Dry Needling, Electrical stimulation, Spinal mobilization, Cryotherapy, Moist heat, Taping, Vasopneumatic device, Ultrasound, Ionotophoresis 4mg /ml Dexamethasone, Manual therapy, and Re-evaluation  PLAN FOR NEXT SESSION: review and progress exercises; continue with postural correction and education; manual work, DN, modalities as indicated    [employer], PT 01/19/2023, 8:58 AM

## 2023-02-02 ENCOUNTER — Ambulatory Visit: Payer: BC Managed Care – PPO | Attending: Family Medicine | Admitting: Rehabilitative and Restorative Service Providers"

## 2023-02-02 ENCOUNTER — Encounter: Payer: Self-pay | Admitting: Rehabilitative and Restorative Service Providers"

## 2023-02-02 DIAGNOSIS — M25512 Pain in left shoulder: Secondary | ICD-10-CM | POA: Diagnosis not present

## 2023-02-02 DIAGNOSIS — R29898 Other symptoms and signs involving the musculoskeletal system: Secondary | ICD-10-CM | POA: Diagnosis not present

## 2023-02-02 DIAGNOSIS — M6281 Muscle weakness (generalized): Secondary | ICD-10-CM | POA: Insufficient documentation

## 2023-02-02 DIAGNOSIS — M25511 Pain in right shoulder: Secondary | ICD-10-CM | POA: Diagnosis not present

## 2023-02-02 DIAGNOSIS — G8929 Other chronic pain: Secondary | ICD-10-CM | POA: Insufficient documentation

## 2023-02-02 NOTE — Therapy (Signed)
OUTPATIENT PHYSICAL THERAPY SHOULDER TREATMENT   Patient Name: Lori King MRN: [id number] DOB:[birth year], 57 y.o., female Today's Date: 02/02/2023  END OF SESSION:  PT End of Session - 02/02/23 1016     Visit Number 9    Number of Visits 24    Date for PT Re-Evaluation 03/29/23    Authorization Type [employer] [id number]    PT Start Time 1015    PT Stop Time 1104    PT Time Calculation (min) 49 min    Activity Tolerance Patient tolerated treatment well             Past Medical History:  Diagnosis Date   Adhesive capsulitis of right shoulder    Allergic rhinitis, seasonal    Anemia    Anxiety    MDD (major depressive disorder)    Wears contact lenses    Past Surgical History:  Procedure Laterality Date   BREAST BIOPSY Left 2010   x2 per pt benign   CESAREAN SECTION  X2  last one 02-28-2001   W/  BILATERAL TUBAL LIGATION w/ last c/s   COLONOSCOPY  11/01/2017   HYSTEROSCOPY W/ ENDOMETRIAL ABLATION  2019   LUMBAR LAMINECTOMY/DECOMPRESSION MICRODISCECTOMY  2013   L4  --- S1   SHOULDER ARTHROSCOPY Right 08/28/2020   Procedure: ARTHROSCOPY SHOULDER lysis of adhesions and manipulation under anesthesia, extensive debridement;  Surgeon: Yolonda Kida, MD;  Location: [facility];  Service: Orthopedics;  Laterality: Right;   There are no problems to display for this patient.   PCP: Dr Angelica Chessman  REFERRING PROVIDER: Dr Arlyce Harman  REFERRING DIAG: pain of Lt shoulder  THERAPY DIAG:  Acute pain of left shoulder  Muscle weakness (generalized)  Other symptoms and signs involving the musculoskeletal system  Chronic right shoulder pain  Rationale for Evaluation and Treatment: Rehabilitation  ONSET DATE: 10/24/22  SUBJECTIVE:                                                                                                                                                                                      SUBJECTIVE STATEMENT: Patient  reports that her shoulder was a bit irritated from travel and she has had more pain since 01/27/23. When she was getting bag from overhead bin in the plane the bag almost fell and she reached up to catch the bag. She felt severe pain which has persisted. She has some numbness in the Lt hand which is constant. She notices she is "a little clumsy" in the Lt hand. Symptoms have not changed in the past 6 days. Using TENS unit for several hours and that helps a little. She is  trying to working on exercises at home. She was out of town on vacation for 10-12 days.  EVAL: Patient reports that she was lifting her luggage out of the overhead bin when she felt pain in the Lt shoulder and has had some pain in an intermittent basis. She had an injection in Lt shoulder 12/22/22 with some improvement but shoulder is stiff and painful.  Hand dominance: Right  PERTINENT HISTORY: Rt shoulder pain 3 yrs with shoulder surgery for labrum tear and biceps tendon; adhesive capsulitis; reactive hypoglycemic disease; fatty liver  PAIN:  Are you having pain? Yes: NPRS scale: 7-8/10 with movement; 6/10 at rest; with movement 5/10; at worst 6/10 Pain location: Lt shoulder  Pain description: sharp; stabbing pain in biceps radiating to elbow with big reaching to the side  Aggravating factors: reaching overhead to the side and behind  Relieving factors: ice; TENs unit   PRECAUTIONS: None  WEIGHT BEARING RESTRICTIONS: No  FALLS:  Has patient fallen in last 6 months? No   PATIENT GOALS:get rid of the shoulder pain and increase ROM   NEXT MD VISIT: Dr Karen Chafe 01/11/23; (Dr Aundria Rud next week)   OBJECTIVE:   DIAGNOSTIC FINDINGS:  None available for shoulder; x-ray (-)    COGNITION: Overall cognitive status: Within functional limits for tasks assessed     POSTURE: Patient presents with head forward posture with increased thoracic kyphosis; shoulders rounded and elevated; scapulae abducted and rotated along the thoracic  spine; head of the humerus anterior in orientation.  CERVICAL ROM: 02/02/23 Flexion 43 tight pain Lt upper trap  Extension 48 tight ant/post neck R lateral flexion 36 tight L cervical  L lateral flexion 42 R rotation 71 L rotation 62 tight pain Lt cervical to upper trap    UPPER EXTREMITY ROM:   Active ROM Right eval Left eval Left  01/11/23 Left  02/02/23  Shoulder flexion 160 112 130 107 pain   Shoulder extension 59 25 48 31 pain   Shoulder abduction 168 112 133 92 pain   Shoulder adduction      Shoulder internal rotation T7 Thumb to waist Thumb  T12 Hand lateral hip pain   Shoulder external rotation 95 85 93 45 pain   Elbow flexion      Elbow extension      Wrist flexion      Wrist extension      Wrist ulnar deviation      Wrist radial deviation      Wrist pronation      Wrist supination      (Blank rows = not tested) PASSIVE ROM - supine flexion 152; scaption 147; ER 90  UPPER EXTREMITY MMT: strength not assessed - moves Lt UE well against gravity; limited by pain  MMT Right eval Left eval  Shoulder flexion    Shoulder extension    Shoulder abduction    Shoulder adduction    Shoulder internal rotation    Shoulder external rotation    Middle trapezius    Lower trapezius    Elbow flexion    Elbow extension    Wrist flexion    Wrist extension    Wrist ulnar deviation    Wrist radial deviation    Wrist pronation    Wrist supination    Grip strength (lbs)    (Blank rows = not tested)  JOINT MOBILITY TESTING:  Significant joint tightness Lt shoulder in all planes  02/02/23: Significant joint tightness Lt shoulder in all planes  PALPATION:  Muscular tightness Lt shoulder through the upper trap; pecs; leveator; teres 02/02/23: muscular tightness Lt shoulder girdle in upper trap; pecs; leveator; rhomboids; teres  [facility] Adult PT Treatment:                                                DATE: 02/02/23 Therapeutic Exercise: Sitting  Pulley flexion 3 sec hold x  15 Pulley horizontal ab/ad in neutral x 15  Lateral cervical flexion stretch in chin tuck 10 sec x 5  Leveator stretch in chin tuck 10 sec x 5   Quadruped   Manual Therapy:  Pt prone working through the pecs; upper trap; leveator; biceps; deltoid   STM/MFR through thoracic paraspinals and into Lt shoulder girdle Joint mobilization Lt shoulder and PROM/stretching pt in supine  Skilled palpation to assess response to DN and manual work  Psychiatrist  Treatment instructions: Expect mild to moderate muscle soreness. S/S of pneumothorax if dry needled over a lung field, and to seek immediate medical attention should they occur. Patient verbalized understanding of these instructions and education.  Patient Consent Given: Yes Education handout provided: Previously provided Muscles treated: Lt rhomboids; middle trap; upper trap; cervical paraspinals ~ C6/7 Electrical stimulation performed:yes  Parameters: mAmp current intensity adjusted to pt tolerance  Treatment response/outcome: decreased palpable tightness noted following treatment    Modalities: Moist heat thoracic spine; cold pac Lt shoulder x 10 min  [facility] Adult PT Treatment:                                                DATE: 01/19/23 Therapeutic Exercise: Sitting  Lateral cervical flexion stretch in chin tuck 10 sec x 5  Leveator stretch in chin tuck 10 sec x 5   Quadruped;    Manual Therapy:   Pt prone and supine working through the pecs; upper trap; leveator; biceps; deltoid   STM/MFR through thoracic paraspinals and into Lt shoulder girdle Joint mobilization Lt shoulder and PROM/stretching pt in supine  Skilled palpation to assess response to DN and manual work  Psychiatrist  Treatment instructions: Expect mild to moderate muscle soreness. S/S of pneumothorax if dry needled over a lung field, and to seek immediate medical attention should they occur. Patient verbalized understanding of these  instructions and education.   Patient Consent Given: Yes Education handout provided: Previously provided Muscles treated: Lt infraspinatus Electrical stimulation performed: No Parameters: N/A Treatment response/outcome: decreased palpable tightness noted following treatment      Modalities: Moist heat thoracic spine; cold pac Lt shoulder x 10 min   PATIENT EDUCATION: Education details: POC; HEP Person educated: Patient Education method: Programmer, multimedia, Demonstration, Actor cues, Verbal cues, and Handouts Education comprehension: verbalized understanding, returned demonstration, verbal cues required, tactile cues required, and needs further education  HOME EXERCISE PROGRAM:  E-mailed to: [email or web address]  Access Code: [email or web address] URL: [email or web address] Date: 01/13/2023 Prepared by: Corlis Leak  Exercises - Seated Shoulder Flexion AAROM with Pulley Behind  - 2 x daily - 7 x weekly - 1 sets - 10 reps - 10 sec  hold - Seated Shoulder Scaption AAROM with Pulley at Side  - 2 x daily - 7 x weekly - 1 sets - 10 reps - 10sec  hold - Seated Cervical Retraction  - 2 x daily - 7 x weekly - 1-2 sets - 5-10 reps - 10 sec  hold - Seated Scapular Retraction  - 2 x daily - 7 x weekly - 1-2 sets - 10 reps - 10 sec  hold - Standing Scapular Retraction  - 3 x daily - 7 x weekly - 1 sets - 10 reps - 10 sec  hold - Shoulder External Rotation and Scapular Retraction  - 3 x daily - 7 x weekly - 1 sets - 10 reps - 3-5 sec   hold - Seated Shoulder W  - 2 x daily - 7 x weekly - 1 sets - 10 reps - 3 sec  hold - Shoulder External Rotation and Scapular Retraction with Resistance  - 2 x daily - 7 x weekly - 1 sets - 10 reps - 3-5 sec  hold - Shoulder W - External Rotation with Resistance  - 2 x daily - 7 x weekly - 1-2 sets - 10 reps - 3 sec  hold - Standing Bilateral Low Shoulder Row with Anchored Resistance  - 2 x daily - 7 x weekly - 1-3 sets - 10 reps - 2-3 sec  hold - Drawing Bow  - 1 x  daily - 7 x weekly - 1 sets - 10 reps - 3 sec  hold - Prone Scapular Retraction  - 2 x daily - 7 x weekly - 1 sets - 5-10 reps - 3-5 sec  hold - Cat Cow  - 2 x daily - 7 x weekly - 1 sets - 5-10 reps - 3-5 sec  hold - Cat Cow to Child's Pose  - 2 x daily - 7 x weekly - 1 sets - 3 reps - 20-30 sec  hold  ASSESSMENT:  CLINICAL IMPRESSION: Patient returns today from vacation over the past two weeks. She stated that she had an incident with Lt shoulder when she was getting her bag from the overhead bin in the airplane. She reports significant increase in Lt shoulder pain with decreased mobility and ROM. She has Increased soreness, tightness and pain in the Lt shoulder as well as decreased AROM and increased palpable tightness in the L shoulder girdle. She reports numbness in the Lt hand through the thumb and decreased exercise tolerance. Suggested patient return to [place] for further medical evaluation and possible pharmaceutical intervention.    OBJECTIVE IMPAIRMENTS: Lt shoulder pain which started with lifting overhead her luggage when travelling. She has had gradually increasing pain and stiffness in the Lt shoulder. She received an injection ~ 1-2 weeks ago with some improvement. Patient presents with poor posture and alignment; limited A and PROM Lt shoulder; functional weakness due to pain; decreased functional activities and pain on a daily basis    GOALS: Goals reviewed with patient? Yes  SHORT TERM GOALS: Target date: 01/04/23  Independent in initial HEP  Baseline: Goal status: INITIAL  2.  Increase AROM shoulder flexion and abduction by 5-10 degrees  Baseline:  Goal status: INITIAL   LONG TERM GOALS: Target date: 03/29/2023   Decrease pain in the Lt shoulder by 75-100% allowing patient to return to normal functional activities  Baseline:  Goal status: INITIAL  2.  AROM Lt shoulder WFL's and pain free  Baseline:  Goal status: INITIAL  3.  Patient reports return to normal  functional activities including lifting grandson with minimal to no increase in Lt shoulder pain  Baseline:  Goal status: INITIAL  4.  Independent in HEP  Baseline:  Goal status: INITIAL  PLAN:  PT FREQUENCY: 2x/week  PT DURATION: 8 weeks  PLANNED INTERVENTIONS: Therapeutic exercises, Therapeutic activity, Neuromuscular re-education, Patient/Family education, Self Care, Joint mobilization, Aquatic Therapy, Dry Needling, Electrical stimulation, Spinal mobilization, Cryotherapy, Moist heat, Taping, Vasopneumatic device, Ultrasound, Ionotophoresis 4mg /ml Dexamethasone, Manual therapy, and Re-evaluation  PLAN FOR NEXT SESSION: review and progress exercises; continue with postural correction and education; manual work, DN, modalities as indicated    [employer], PT 02/02/2023, 10:17 AM

## 2023-02-07 DIAGNOSIS — M7502 Adhesive capsulitis of left shoulder: Secondary | ICD-10-CM | POA: Diagnosis not present

## 2023-02-08 DIAGNOSIS — F329 Major depressive disorder, single episode, unspecified: Secondary | ICD-10-CM | POA: Diagnosis not present

## 2023-02-09 ENCOUNTER — Encounter: Payer: Self-pay | Admitting: Rehabilitative and Restorative Service Providers"

## 2023-02-09 ENCOUNTER — Ambulatory Visit: Payer: BC Managed Care – PPO | Admitting: Rehabilitative and Restorative Service Providers"

## 2023-02-09 DIAGNOSIS — G8929 Other chronic pain: Secondary | ICD-10-CM | POA: Diagnosis not present

## 2023-02-09 DIAGNOSIS — M6281 Muscle weakness (generalized): Secondary | ICD-10-CM | POA: Diagnosis not present

## 2023-02-09 DIAGNOSIS — R29898 Other symptoms and signs involving the musculoskeletal system: Secondary | ICD-10-CM

## 2023-02-09 DIAGNOSIS — M25512 Pain in left shoulder: Secondary | ICD-10-CM

## 2023-02-09 DIAGNOSIS — M25511 Pain in right shoulder: Secondary | ICD-10-CM | POA: Diagnosis not present

## 2023-02-09 NOTE — Therapy (Signed)
OUTPATIENT PHYSICAL THERAPY SHOULDER TREATMENT   Patient Name: Lori King MRN: [id number] DOB:Mar 22, 1966, 57 y.o., female Today's Date: 02/09/2023  END OF SESSION:  PT End of Session - 02/09/23 1024     Visit Number 10    Number of Visits 24    Date for PT Re-Evaluation 03/29/23    Authorization Type [employer] [id number]    PT Start Time 1015    PT Stop Time 1104    PT Time Calculation (min) 49 min    Activity Tolerance Patient tolerated treatment well             Past Medical History:  Diagnosis Date   Adhesive capsulitis of right shoulder    Allergic rhinitis, seasonal    Anemia    Anxiety    MDD (major depressive disorder)    Wears contact lenses    Past Surgical History:  Procedure Laterality Date   BREAST BIOPSY Left 2010   x2 per pt benign   CESAREAN SECTION  X2  last one 02-28-2001   W/  BILATERAL TUBAL LIGATION w/ last c/s   COLONOSCOPY  11/01/2017   HYSTEROSCOPY W/ ENDOMETRIAL ABLATION  2019   LUMBAR LAMINECTOMY/DECOMPRESSION MICRODISCECTOMY  2013   L4  --- S1   SHOULDER ARTHROSCOPY Right 08/28/2020   Procedure: ARTHROSCOPY SHOULDER lysis of adhesions and manipulation under anesthesia, extensive debridement;  Surgeon: Yolonda Kida, MD;  Location: [facility];  Service: Orthopedics;  Laterality: Right;   There are no problems to display for this patient.   PCP: Dr Angelica Chessman  REFERRING PROVIDER: Dr Arlyce Harman  REFERRING DIAG: pain of Lt shoulder  THERAPY DIAG:  Acute pain of left shoulder  Muscle weakness (generalized)  Other symptoms and signs involving the musculoskeletal system  Chronic right shoulder pain  Rationale for Evaluation and Treatment: Rehabilitation  ONSET DATE: 10/24/22  SUBJECTIVE:                                                                                                                                                                                      SUBJECTIVE  STATEMENT: Patient saw PA in orthopedics office this week. He prescribed muscle relaxants and an antiinflammatory medication which helps some but [name] can tell when it is time to take more medication. Pain in the Lt neck and shoulder area continue with sharp pains in the shoulder at times. She has tingling in the ring and little fingers and the thumb on an intermittent basis. She is having difficulty getting comfortable to sleep. She is scheduled for MRI next week but does not see MD until second week in July. She is on vacation  the first week in July.   EVAL: Patient reports that she was lifting her luggage out of the overhead bin when she felt pain in the Lt shoulder and has had some pain in an intermittent basis. She had an injection in Lt shoulder 12/22/22 with some improvement but shoulder is stiff and painful.  Hand dominance: Right  PERTINENT HISTORY: Rt shoulder pain 3 yrs with shoulder surgery for labrum tear and biceps tendon; adhesive capsulitis; reactive hypoglycemic disease; fatty liver  PAIN:  Are you having pain? Yes: NPRS scale: 7-8/10 with movement; 6/10 at rest; with movement 6/10; at worst 7-8/10 Pain location: Rt shoulder  Pain description: sharp; stabbing pain in biceps radiating to elbow with big reaching to the side  Aggravating factors: reaching overhead to the side and behind  Relieving factors: ice; TENs unit   PRECAUTIONS: None  WEIGHT BEARING RESTRICTIONS: No  FALLS:  Has patient fallen in last 6 months? No   PATIENT GOALS:get rid of the shoulder pain and increase ROM   NEXT MD VISIT: Dr Karen Chafe 01/11/23; (Dr Aundria Rud next week)   OBJECTIVE:   DIAGNOSTIC FINDINGS:  None available for shoulder; x-ray (-)    COGNITION: Overall cognitive status: Within functional limits for tasks assessed     POSTURE: Patient presents with head forward posture with increased thoracic kyphosis; shoulders rounded and elevated; scapulae abducted and rotated along the thoracic  spine; head of the humerus anterior in orientation.  CERVICAL ROM: 02/02/23 Flexion 43 tight pain Lt upper trap  Extension 48 tight ant/post neck R lateral flexion 36 tight L cervical  L lateral flexion 42 R rotation 71 L rotation 62 tight pain Lt cervical to upper trap    UPPER EXTREMITY ROM:   Active ROM Right eval Left eval Left  01/11/23 Left  02/02/23  Shoulder flexion 160 112 130 107 pain   Shoulder extension 59 25 48 31 pain   Shoulder abduction 168 112 133 92 pain   Shoulder adduction      Shoulder internal rotation T7 Thumb to waist Thumb  T12 Hand lateral hip pain   Shoulder external rotation 95 85 93 45 pain   Elbow flexion      Elbow extension      Wrist flexion      Wrist extension      Wrist ulnar deviation      Wrist radial deviation      Wrist pronation      Wrist supination      (Blank rows = not tested) PASSIVE ROM - supine flexion 152; scaption 147; ER 90  UPPER EXTREMITY MMT: strength not assessed - moves Lt UE well against gravity; limited by pain  MMT Right eval Left eval  Shoulder flexion    Shoulder extension    Shoulder abduction    Shoulder adduction    Shoulder internal rotation    Shoulder external rotation    Middle trapezius    Lower trapezius    Elbow flexion    Elbow extension    Wrist flexion    Wrist extension    Wrist ulnar deviation    Wrist radial deviation    Wrist pronation    Wrist supination    Grip strength (lbs)    (Blank rows = not tested)  JOINT MOBILITY TESTING:  Significant joint tightness Lt shoulder in all planes  02/02/23: Significant joint tightness Lt shoulder in all planes  UPPER LIMB TENSION TEST:  Positive L with patient unable to achieve 90  deg shoulder abduction for testing. UE in ~ 60 deg abduction with elbow extension produced radicular symptoms into Lt UE   PALPATION:  Muscular tightness Lt shoulder through the upper trap; pecs; leveator; teres 02/02/23: muscular tightness Lt shoulder girdle in upper  trap; pecs; leveator; rhomboids; teres 02/09/23: muscular tightness Lt cervical and upper trap area through the shoulder girdle including pecs; leveator; rhomboids; teres  [facility] Adult PT Treatment:                                                DATE: 02/09/23 Therapeutic Exercise: Sitting  Pulley flexion 3 sec hold x 15 Pulley horizontal ab/ad in neutral x 15  Lateral cervical flexion stretch in chin tuck 10 sec x 5  Leveator stretch in chin tuck 10 sec x 5     Manual Therapy:  Pt supine for STM working through the cervical musculature and pecs; upper trap; leveator; biceps; deltoid   STM/MFR through thoracic paraspinals and into Lt shoulder girdle Joint mobilization Lt shoulder and PROM/stretching pt in supine  Kinesotaping unload the deltoid and inhibition of upper trap  Modalities: Moist heat thoracic spine; Lt shoulder x 10 min  [facility] Adult PT Treatment:                                                DATE: 02/02/23 Therapeutic Exercise: Sitting  Pulley flexion 3 sec hold x 15 Pulley horizontal ab/ad in neutral x 15  Lateral cervical flexion stretch in chin tuck 10 sec x 5  Leveator stretch in chin tuck 10 sec x 5   Quadruped   Manual Therapy:  Pt prone working through the pecs; upper trap; leveator; biceps; deltoid   STM/MFR through thoracic paraspinals and into Lt shoulder girdle Joint mobilization Lt shoulder and PROM/stretching pt in supine  Skilled palpation to assess response to DN and manual work  Psychiatrist  Treatment instructions: Expect mild to moderate muscle soreness. S/S of pneumothorax if dry needled over a lung field, and to seek immediate medical attention should they occur. Patient verbalized understanding of these instructions and education.  Patient Consent Given: Yes Education handout provided: Previously provided Muscles treated: Lt rhomboids; middle trap; upper trap; cervical paraspinals ~ C6/7 Electrical stimulation performed:yes   Parameters: mAmp current intensity adjusted to pt tolerance  Treatment response/outcome: decreased palpable tightness noted following treatment    Modalities: Moist heat thoracic spine; cold pac Lt shoulder x 10 min   PATIENT EDUCATION: Education details: POC; HEP Person educated: Patient Education method: Programmer, multimedia, Demonstration, Tactile cues, Verbal cues, and Handouts Education comprehension: verbalized understanding, returned demonstration, verbal cues required, tactile cues required, and needs further education  HOME EXERCISE PROGRAM:  E-mailed to: [email or web address]  Access Code: [email or web address] URL: [email or web address] Date: 01/13/2023 Prepared by: Corlis Leak  Exercises - Seated Shoulder Flexion AAROM with Pulley Behind  - 2 x daily - 7 x weekly - 1 sets - 10 reps - 10 sec  hold - Seated Shoulder Scaption AAROM with Pulley at Side  - 2 x daily - 7 x weekly - 1 sets - 10 reps - 10sec  hold - Seated Cervical Retraction  - 2 x daily - 7 x weekly - 1-2 sets -  5-10 reps - 10 sec  hold - Seated Scapular Retraction  - 2 x daily - 7 x weekly - 1-2 sets - 10 reps - 10 sec  hold - Standing Scapular Retraction  - 3 x daily - 7 x weekly - 1 sets - 10 reps - 10 sec  hold - Shoulder External Rotation and Scapular Retraction  - 3 x daily - 7 x weekly - 1 sets - 10 reps - 3-5 sec   hold - Seated Shoulder W  - 2 x daily - 7 x weekly - 1 sets - 10 reps - 3 sec  hold - Shoulder External Rotation and Scapular Retraction with Resistance  - 2 x daily - 7 x weekly - 1 sets - 10 reps - 3-5 sec  hold - Shoulder W - External Rotation with Resistance  - 2 x daily - 7 x weekly - 1-2 sets - 10 reps - 3 sec  hold - Standing Bilateral Low Shoulder Row with Anchored Resistance  - 2 x daily - 7 x weekly - 1-3 sets - 10 reps - 2-3 sec  hold - Drawing Bow  - 1 x daily - 7 x weekly - 1 sets - 10 reps - 3 sec  hold - Prone Scapular Retraction  - 2 x daily - 7 x weekly - 1 sets - 5-10 reps - 3-5 sec   hold - Cat Cow  - 2 x daily - 7 x weekly - 1 sets - 5-10 reps - 3-5 sec  hold - Cat Cow to Child's Pose  - 2 x daily - 7 x weekly - 1 sets - 3 reps - 20-30 sec  hold  ASSESSMENT:  CLINICAL IMPRESSION: Patient was seen by PA and prescribed muscle relaxant and antiinflammatory which has helped some. She is still flared up from the incident with Lt shoulder when she was getting her bag from the overhead bin in the airplane. Evaluation and treatment today indicate some cervical involvement of Lt UE symptoms. She has positive ULTT and muscular tightness to palpation through the Lt cervical and upper trap/clavicular area. Trial of gentle axial extension in supine and neural mobilization as pt tolerates. Taping for unloading deltoid and inhibition of upper trap. Scheduled for MRI next week.   OBJECTIVE IMPAIRMENTS: Lt shoulder pain which started with lifting overhead her luggage when travelling. She has had gradually increasing pain and stiffness in the Lt shoulder. She received an injection ~ 1-2 weeks ago with some improvement. Patient presents with poor posture and alignment; limited A and PROM Lt shoulder; functional weakness due to pain; decreased functional activities and pain on a daily basis    GOALS: Goals reviewed with patient? Yes  SHORT TERM GOALS: Target date: 01/04/23  Independent in initial HEP  Baseline: Goal status: INITIAL  2.  Increase AROM shoulder flexion and abduction by 5-10 degrees  Baseline:  Goal status: INITIAL   LONG TERM GOALS: Target date: 03/29/2023   Decrease pain in the Lt shoulder by 75-100% allowing patient to return to normal functional activities  Baseline:  Goal status: INITIAL  2.  AROM Lt shoulder WFL's and pain free  Baseline:  Goal status: INITIAL  3.  Patient reports return to normal functional activities including lifting grandson with minimal to no increase in Lt shoulder pain  Baseline:  Goal status: INITIAL  4.  Independent in HEP   Baseline:  Goal status: INITIAL  PLAN:  PT FREQUENCY: 2x/week  PT DURATION: 8 weeks  PLANNED INTERVENTIONS: Therapeutic exercises, Therapeutic activity, Neuromuscular re-education, Patient/Family education, Self Care, Joint mobilization, Aquatic Therapy, Dry Needling, Electrical stimulation, Spinal mobilization, Cryotherapy, Moist heat, Taping, Vasopneumatic device, Ultrasound, Ionotophoresis 4mg /ml Dexamethasone, Manual therapy, and Re-evaluation  PLAN FOR NEXT SESSION: review and progress exercises; continue with postural correction and education; manual work, DN, modalities as indicated    [employer], PT 02/09/2023, 10:25 AM

## 2023-02-13 ENCOUNTER — Encounter (HOSPITAL_BASED_OUTPATIENT_CLINIC_OR_DEPARTMENT_OTHER): Payer: Self-pay

## 2023-02-13 ENCOUNTER — Emergency Department (HOSPITAL_BASED_OUTPATIENT_CLINIC_OR_DEPARTMENT_OTHER)
Admission: EM | Admit: 2023-02-13 | Discharge: 2023-02-13 | Disposition: A | Discharge status: Home / Self Care | Payer: BC Managed Care – PPO | Attending: Emergency Medicine | Admitting: Emergency Medicine

## 2023-02-13 ENCOUNTER — Other Ambulatory Visit: Payer: Self-pay

## 2023-02-13 ENCOUNTER — Emergency Department (HOSPITAL_BASED_OUTPATIENT_CLINIC_OR_DEPARTMENT_OTHER): Payer: BC Managed Care – PPO

## 2023-02-13 DIAGNOSIS — M25512 Pain in left shoulder: Secondary | ICD-10-CM | POA: Diagnosis not present

## 2023-02-13 MED ORDER — DICLOFENAC SODIUM 1 % EX GEL: 2.0000 g | Freq: Four times a day (QID) | CUTANEOUS | 0 refills | Status: DC | PRN

## 2023-02-13 MED ORDER — KETOROLAC TROMETHAMINE 30 MG/ML IJ SOLN
30.0000 mg | Freq: Once | INTRAMUSCULAR | Status: AC
  Administered 2023-02-13: 30 mg via INTRAMUSCULAR
  Filled 2023-02-13: qty 1

## 2023-02-13 MED ORDER — OXYCODONE-ACETAMINOPHEN 5-325 MG PO TABS: 1.0000 | ORAL_TABLET | Freq: Four times a day (QID) | ORAL | 0 refills | Status: DC | PRN

## 2023-02-13 MED ORDER — SENNOSIDES-DOCUSATE SODIUM 8.6-50 MG PO TABS: 1.0000 | ORAL_TABLET | Freq: Every evening | ORAL | 0 refills | Status: DC | PRN

## 2023-02-13 MED ORDER — OXYCODONE-ACETAMINOPHEN 5-325 MG PO TABS
1.0000 | ORAL_TABLET | Freq: Once | ORAL | Status: AC
  Administered 2023-02-13: 1 via ORAL
  Filled 2023-02-13: qty 1

## 2023-02-13 NOTE — ED Provider Notes (Signed)
Emergency Department Provider Note   I have reviewed the triage vital signs and the nursing notes.   HISTORY  Chief Complaint Shoulder Injury   HPI Lori King is a 57 y.o. female with a past history of shoulder pain presents emergency department with severe left shoulder pain after injury.  She states she has had issues with both shoulders including prior surgical intervention on the right.  Her left shoulder has been causing increased pain and she is following with an orthopedist.  She actually has an MRI scheduled on the shoulder next Thursday.  Yesterday, she injured the shoulder which she describes as something pushing from the palm causing the shoulder to push upward.  She does not think it dislocated.  Since that time she has had severe pain in her axilla, anterior shoulder, posterior shoulder, and pain running down to the left fourth and fifth digits. No numbness. No specific neck pain.   Past Medical History:  Diagnosis Date   Adhesive capsulitis of right shoulder    Allergic rhinitis, seasonal    Anemia    Anxiety    MDD (major depressive disorder)    Wears contact lenses     Review of Systems  Constitutional: No fever/chills Cardiovascular: Denies chest pain. Respiratory: Denies shortness of breath. Gastrointestinal: No abdominal pain.  Musculoskeletal: Positive left shoulder pain and neck pain.  Skin: Negative for rash. Neurological: Negative for headaches, focal weakness or numbness.   ____________________________________________   PHYSICAL EXAM:  VITAL SIGNS: ED Triage Vitals  Enc Vitals Group     BP 02/13/23 0749 126/88     Pulse Rate 02/13/23 0749 73     Resp 02/13/23 0749 18     Temp 02/13/23 0749 (!) 97.4 F (36.3 C)     Temp Source 02/13/23 0749 Oral     SpO2 02/13/23 0749 99 %     Weight 02/13/23 0750 130 lb (59 kg)     Height 02/13/23 0750 5\' 3"  (1.6 m)   Constitutional: Alert and oriented. Well appearing and in no acute  distress. Eyes: Conjunctivae are normal.  Head: Atraumatic. Nose: No congestion/rhinnorhea. Mouth/Throat: Mucous membranes are moist.   Neck: No stridor.   Cardiovascular: Good peripheral circulation.  Respiratory: Normal respiratory effort.  Gastrointestinal: No distention.  Musculoskeletal: Pain with range of motion of the left shoulder.  Increased discomfort with abduction of the shoulder.  Patient cannot get to 90 degrees or beyond. No tenderness over the clavicle, c spine, or elbow.  Neurologic:  Normal speech and language. No gross focal neurologic deficits are appreciated.  Skin:  Skin is warm, dry and intact. No rash noted.  ____________________________________________  RADIOLOGY  DG Shoulder Left  Result Date: 02/13/2023 CLINICAL DATA:  57 year old female with history of left-sided shoulder pain. EXAM: LEFT SHOULDER - 2+ VIEW COMPARISON:  None Available. FINDINGS: Four views of the left shoulder demonstrate no acute displaced fracture, subluxation or dislocation. There is some joint space narrowing, subchondral sclerosis, subchondral cyst formation and osteophyte formation in the glenohumeral joint, indicative of osteoarthritis. IMPRESSION: 1. No acute radiographic abnormality of the left shoulder. 2. Glenohumeral joint osteoarthritis. Electronically Signed   By: Trudie Reed M.D.   On: 02/13/2023 08:38    ____________________________________________   PROCEDURES  Procedure(s) performed:   Procedures  None  ____________________________________________   INITIAL IMPRESSION / ASSESSMENT AND PLAN / ED COURSE  Pertinent labs & imaging results that were available during my care of the patient were reviewed by me and considered  in my medical decision making (see chart for details).   This patient is Presenting for Evaluation of shoulder pain, which does require a range of treatment options, and is a complaint that involves a moderate risk of morbidity and  mortality.  The Differential Diagnoses include fracture, dislocation, labral tear, rotator cuff tear, contusion, septic joint, etc.  Critical Interventions-    Medications  ketorolac (TORADOL) 30 MG/ML injection 30 mg (30 mg Intramuscular Given 02/13/23 0804)  oxyCODONE-acetaminophen (PERCOCET/ROXICET) 5-325 MG per tablet 1 tablet (1 tablet Oral Given 02/13/23 0804)    Reassessment after intervention: symptoms slightly improved.    I did obtain Additional Historical Information from partner at bedside.   Radiologic Tests Ordered, included shoulder  XR. I independently interpreted the images and agree with radiology interpretation.   Medical Decision Making: Summary:  Patient presents emergency department for evaluation of acute on chronic exacerbation of left shoulder pain.  She actually has an MRI scheduled for Thursday.  No evidence of septic joint.  My suspicion for dislocation is exceedingly low.  Plan for pain management and reassess after x-ray.  Reevaluation with update and discussion with patient and partner at bedside. OA changes on XR. Plan for short course of Percocet to help with symptoms until MRI on Thursday. Plan to continue ROM exercises and keep ortho follow up. Discussed adverse effects of Percocet and drug interactions in detail.   Patient's presentation is most consistent with acute, uncomplicated illness.   Disposition: discharge  ____________________________________________  FINAL CLINICAL IMPRESSION(S) / ED DIAGNOSES  Final diagnoses:  Acute pain of left shoulder     NEW OUTPATIENT MEDICATIONS STARTED DURING THIS VISIT:  New Prescriptions   DICLOFENAC SODIUM (VOLTAREN) 1 % GEL    Apply 2 g topically 4 (four) times daily as needed.   OXYCODONE-ACETAMINOPHEN (PERCOCET/ROXICET) 5-325 MG TABLET    Take 1 tablet by mouth every 6 (six) hours as needed for severe pain.   SENNA-DOCUSATE (SENOKOT-S) 8.6-50 MG TABLET    Take 1 tablet by mouth at bedtime as needed  for moderate constipation or mild constipation.    Note:  This document was prepared using Dragon voice recognition software and may include unintentional dictation errors.  Alona Bene, MD, [facility] Emergency Medicine    Tomesha Sargent, Arlyss Repress, MD 02/13/23 0900

## 2023-02-13 NOTE — Discharge Instructions (Signed)
You were seen in the emergency room today with shoulder pain.  Your x-ray shows arthritis but I suspect underlying soft tissue degenerative changes.  Please keep your appointment for MRI on Thursday and continue following with the orthopedic team.  I have called in some stronger pain medicine, Percocet, to take as needed for severe pain.  You do not want to take this within 3 hours of your muscle relaxing medicine.  It is okay to take this with meloxicam.  You did receive Toradol in the ER today and so I would skip your morning dose of meloxicam.  Percocet will cause constipation and so I have also called in some constipation medicine.
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
## 2023-02-14 DIAGNOSIS — M25512 Pain in left shoulder: Secondary | ICD-10-CM | POA: Diagnosis not present

## 2023-02-15 ENCOUNTER — Ambulatory Visit: Payer: BC Managed Care – PPO | Admitting: Rehabilitative and Restorative Service Providers"

## 2023-02-15 ENCOUNTER — Encounter: Payer: Self-pay | Admitting: Rehabilitative and Restorative Service Providers"

## 2023-02-15 DIAGNOSIS — G8929 Other chronic pain: Secondary | ICD-10-CM | POA: Diagnosis not present

## 2023-02-15 DIAGNOSIS — M6281 Muscle weakness (generalized): Secondary | ICD-10-CM | POA: Diagnosis not present

## 2023-02-15 DIAGNOSIS — R29898 Other symptoms and signs involving the musculoskeletal system: Secondary | ICD-10-CM

## 2023-02-15 DIAGNOSIS — M25511 Pain in right shoulder: Secondary | ICD-10-CM | POA: Diagnosis not present

## 2023-02-15 DIAGNOSIS — M25512 Pain in left shoulder: Secondary | ICD-10-CM | POA: Diagnosis not present

## 2023-02-15 NOTE — Therapy (Signed)
OUTPATIENT PHYSICAL THERAPY SHOULDER TREATMENT   Patient Name: Lori King MRN: [id number] DOB:1966-08-09, 57 y.o., female Today's Date: 02/15/2023  END OF SESSION:  PT End of Session - 02/15/23 1026     Visit Number 11    Number of Visits 24    Date for PT Re-Evaluation 03/29/23    Authorization Type BCBS [id number]    PT Start Time 1015    PT Stop Time 1104    PT Time Calculation (min) 49 min    Activity Tolerance Patient tolerated treatment well             Past Medical History:  Diagnosis Date   Adhesive capsulitis of right shoulder    Allergic rhinitis, seasonal    Anemia    Anxiety    MDD (major depressive disorder)    Wears contact lenses    Past Surgical History:  Procedure Laterality Date   BREAST BIOPSY Left 2010   x2 per pt benign   CESAREAN SECTION  X2  last one 02-28-2001   W/  BILATERAL TUBAL LIGATION w/ last c/s   COLONOSCOPY  11/01/2017   HYSTEROSCOPY W/ ENDOMETRIAL ABLATION  2019   LUMBAR LAMINECTOMY/DECOMPRESSION MICRODISCECTOMY  2013   L4  --- S1   SHOULDER ARTHROSCOPY Right 08/28/2020   Procedure: ARTHROSCOPY SHOULDER lysis of adhesions and manipulation under anesthesia, extensive debridement;  Surgeon: Yolonda Kida, MD;  Location: [facility];  Service: Orthopedics;  Laterality: Right;   There are no problems to display for this patient.   PCP: Dr Angelica Chessman  REFERRING PROVIDER: Dr Arlyce Harman  REFERRING DIAG: pain of Lt shoulder  THERAPY DIAG:  Acute pain of left shoulder  Muscle weakness (generalized)  Other symptoms and signs involving the musculoskeletal system  Chronic right shoulder pain  Rationale for Evaluation and Treatment: Rehabilitation  ONSET DATE: 10/24/22  SUBJECTIVE:                                                                                                                                                                                      SUBJECTIVE  STATEMENT: Patient reports significant increase in L UE symptoms over the past week. She had an upward force through the Lt UE which resulted in significant increase in pain and patient seeking care in the ED Sunday night. Xrays were negative and patient was treated with pain meds and antiinflammatory meds. She has persistent pain in the Lt UE with numbness in the Lt hand. She had an MRI 02/13/23 but she does not have the results. She is not scheduled to see the orthopedist until 03/08/23. [name] reports that the muscle relaxants and  an antiinflammatory medication helps 'some' but she can definitely tell when it is time to take more medication. Pain in the L neck and shoulder area continue with sharp pains in the shoulder at times. She has tingling and numbness in the ring and little fingers and the thumb on an intermittent basis. She is having difficulty getting comfortable to sleep. She is on vacation the first week in July.   EVAL: Patient reports that she was lifting her luggage out of the overhead bin when she felt pain in the Lt shoulder and has had some pain in an intermittent basis. She had an injection in Lt shoulder 12/22/22 with some improvement but shoulder is stiff and painful.  Hand dominance: Right  PERTINENT HISTORY: Rt shoulder pain 3 yrs with shoulder surgery for labrum tear and biceps tendon; adhesive capsulitis; reactive hypoglycemic disease; fatty liver  PAIN:  Are you having pain? Yes: NPRS scale: 9/10 with movement; 7-8/10 at rest; with movement 8-9/10; at worst 9-10/10 Pain location: R shoulder/arm  Pain description: sharp; stabbing pain in biceps radiating to elbow with big reaching to the side  Aggravating factors: reaching overhead to the side and behind  Relieving factors: ice; TENs unit   PRECAUTIONS: None  WEIGHT BEARING RESTRICTIONS: No  FALLS:  Has patient fallen in last 6 months? No   PATIENT GOALS:get rid of the shoulder pain and increase ROM   NEXT MD VISIT:  Dr Karen Chafe 01/11/23; (Dr Aundria Rud next week)   OBJECTIVE:   DIAGNOSTIC FINDINGS:  None available for shoulder; x-ray (-)   02/13/23 MRI Lt shoulder -  some joint space narrowing, subchondral sclerosis, subchondral cyst formation and osteophyte formation in the glenohumeral joint, indicative of osteoarthritis.  POSTURE: Patient presents with head forward posture with increased thoracic kyphosis; shoulders rounded and elevated; scapulae abducted and rotated along the thoracic spine; head of the humerus anterior in orientation.  CERVICAL ROM: 02/02/23 Flexion 43 tight pain Lt upper trap  Extension 48 tight ant/post neck R lateral flexion 36 tight L cervical  L lateral flexion 42 R rotation 71 L rotation 62 tight pain Lt cervical to upper trap    UPPER EXTREMITY ROM:   Active ROM Right eval Left eval Left  01/11/23 Left  02/02/23  Shoulder flexion 160 112 130 107 pain   Shoulder extension 59 25 48 31 pain   Shoulder abduction 168 112 133 92 pain   Shoulder adduction      Shoulder internal rotation T7 Thumb to waist Thumb  T12 Hand lateral hip pain   Shoulder external rotation 95 85 93 45 pain   Elbow flexion      Elbow extension      Wrist flexion      Wrist extension      Wrist ulnar deviation      Wrist radial deviation      Wrist pronation      Wrist supination      (Blank rows = not tested) PASSIVE ROM - supine flexion 152; scaption 147; ER 90  UPPER EXTREMITY MMT: strength not assessed - moves Lt UE well against gravity; limited by pain  MMT Right eval Left eval  Shoulder flexion    Shoulder extension    Shoulder abduction    Shoulder adduction    Shoulder internal rotation    Shoulder external rotation    Middle trapezius    Lower trapezius    Elbow flexion    Elbow extension    Wrist flexion  Wrist extension    Wrist ulnar deviation    Wrist radial deviation    Wrist pronation    Wrist supination    Grip strength (lbs)    (Blank rows = not  tested)  JOINT MOBILITY TESTING:  Significant joint tightness Lt shoulder in all planes  02/02/23: Significant joint tightness Lt shoulder in all planes  UPPER LIMB TENSION TEST:  Positive L with patient unable to achieve 90 deg shoulder abduction for testing. UE in ~ 60 deg abduction with elbow extension produced radicular symptoms into Lt UE   PALPATION:  Muscular tightness Lt shoulder through the upper trap; pecs; leveator; teres 02/02/23: muscular tightness Lt shoulder girdle in upper trap; pecs; leveator; rhomboids; teres 02/09/23: muscular tightness Lt cervical and upper trap area through the shoulder girdle including pecs; leveator; rhomboids; teres 02/15/23: persistent tightness L cervical and upper trap area   [facility] Adult PT Treatment:                                                DATE: 02/15/23 Therapeutic Exercise: Sitting  Chin tuck  Supine  Chin tuck/neutral spine Manual Therapy: Pt supine for STM working through the cervical musculature and pecs; upper trap; leveator; biceps; deltoid  Focus on STM through the L cervical area with good release of muscular tightness noted   STM/MFR through thoracic paraspinals and into Lt shoulder girdle Skilled palpation to assess response to manual work and DN Trigger Point Dry-Needling  Treatment instructions: Expect mild to moderate muscle soreness. S/S of pneumothorax if dry needled over a lung field, and to seek immediate medical attention should they occur. Patient verbalized understanding of these instructions and education.  Patient Consent Given: Yes Education handout provided: Previously provided Muscles treated: L lateral cervical; upper trap  Electrical stimulation performed: No Parameters: N/A Treatment response/outcome: decreased palpable tightness and decreased radicular symptoms L UE  Joint mobilization Lt shoulder and PROM/stretching pt in supine  Kinesotaping inhibition of upper trap and correct anterior instability L  shoulder  Modalities: Moist heat thoracic spine; Lt shoulder x 10 min  [facility] Adult PT Treatment:                                                DATE: 02/09/23 Therapeutic Exercise: Sitting  Pulley flexion 3 sec hold x 15 Pulley horizontal ab/ad in neutral x 15  Lateral cervical flexion stretch in chin tuck 10 sec x 5  Leveator stretch in chin tuck 10 sec x 5     Manual Therapy:  Pt supine for STM working through the cervical musculature and pecs; upper trap; leveator; biceps; deltoid   STM/MFR through thoracic paraspinals and into Lt shoulder girdle Joint mobilization Lt shoulder and PROM/stretching pt in supine  Kinesotaping unload the deltoid and inhibition of upper trap  Modalities: Moist heat thoracic spine; Lt shoulder x 10 min  [facility] Adult PT Treatment:                                                DATE: 02/02/23 Therapeutic Exercise: Sitting  Pulley flexion 3  sec hold x 15 Pulley horizontal ab/ad in neutral x 15  Lateral cervical flexion stretch in chin tuck 10 sec x 5  Leveator stretch in chin tuck 10 sec x 5   Quadruped   Manual Therapy:  Pt prone working through the pecs; upper trap; leveator; biceps; deltoid   STM/MFR through thoracic paraspinals and into Lt shoulder girdle Joint mobilization Lt shoulder and PROM/stretching pt in supine  Skilled palpation to assess response to DN and manual work  Psychiatrist  Treatment instructions: Expect mild to moderate muscle soreness. S/S of pneumothorax if dry needled over a lung field, and to seek immediate medical attention should they occur. Patient verbalized understanding of these instructions and education.  Patient Consent Given: Yes Education handout provided: Previously provided Muscles treated: Lt rhomboids; middle trap; upper trap; cervical paraspinals ~ C6/7 Electrical stimulation performed:yes  Parameters: mAmp current intensity adjusted to pt tolerance  Treatment response/outcome: decreased  palpable tightness noted following treatment    Modalities: Moist heat thoracic spine; cold pac Lt shoulder x 10 min   PATIENT EDUCATION: Education details: POC; HEP Person educated: Patient Education method: Programmer, multimedia, Demonstration, Tactile cues, Verbal cues, and Handouts Education comprehension: verbalized understanding, returned demonstration, verbal cues required, tactile cues required, and needs further education  HOME EXERCISE PROGRAM:  E-mailed to: [email or web address]  Access Code: [email or web address] URL: [email or web address] Date: 01/13/2023 Prepared by: Corlis Leak  Exercises - Seated Shoulder Flexion AAROM with Pulley Behind  - 2 x daily - 7 x weekly - 1 sets - 10 reps - 10 sec  hold - Seated Shoulder Scaption AAROM with Pulley at Side  - 2 x daily - 7 x weekly - 1 sets - 10 reps - 10sec  hold - Seated Cervical Retraction  - 2 x daily - 7 x weekly - 1-2 sets - 5-10 reps - 10 sec  hold - Seated Scapular Retraction  - 2 x daily - 7 x weekly - 1-2 sets - 10 reps - 10 sec  hold - Standing Scapular Retraction  - 3 x daily - 7 x weekly - 1 sets - 10 reps - 10 sec  hold - Shoulder External Rotation and Scapular Retraction  - 3 x daily - 7 x weekly - 1 sets - 10 reps - 3-5 sec   hold - Seated Shoulder W  - 2 x daily - 7 x weekly - 1 sets - 10 reps - 3 sec  hold - Shoulder External Rotation and Scapular Retraction with Resistance  - 2 x daily - 7 x weekly - 1 sets - 10 reps - 3-5 sec  hold - Shoulder W - External Rotation with Resistance  - 2 x daily - 7 x weekly - 1-2 sets - 10 reps - 3 sec  hold - Standing Bilateral Low Shoulder Row with Anchored Resistance  - 2 x daily - 7 x weekly - 1-3 sets - 10 reps - 2-3 sec  hold - Drawing Bow  - 1 x daily - 7 x weekly - 1 sets - 10 reps - 3 sec  hold - Prone Scapular Retraction  - 2 x daily - 7 x weekly - 1 sets - 5-10 reps - 3-5 sec  hold - Cat Cow  - 2 x daily - 7 x weekly - 1 sets - 5-10 reps - 3-5 sec  hold - Cat Cow to Child's  Pose  - 2 x daily - 7 x weekly - 1 sets - 3 reps -  20-30 sec  hold  ASSESSMENT:  CLINICAL IMPRESSION: Patient reports continued increased pain which has been worse in the past 2-3 days. She did irritate with shoulder with movements of the L UE. Patient was seen in ED prescribed pain meds. She is continuing with muscle relaxant and antiinflammatory which has helped some. She is still flared up from the incident with L shoulder when she was getting her bag from the overhead bin in the airplane. Evaluation and treatment today continue to  indicate cervical involvement of L UE symptoms. She has positive ULTT and muscular tightness to palpation through the L cervical and upper trap/clavicular area. Responds favorably to intervention through the L cervical spine. Continue axial extension in supine and neural mobilization as pt tolerates. Taping for anterior instability and inhibition of upper trap. MRI shows arthritis in L shoulder but no RC pathology.   OBJECTIVE IMPAIRMENTS: Lt shoulder pain which started with lifting overhead her luggage when travelling. She has had gradually increasing pain and stiffness in the Lt shoulder. She received an injection ~ 1-2 weeks ago with some improvement. Patient presents with poor posture and alignment; limited A and PROM Lt shoulder; functional weakness due to pain; decreased functional activities and pain on a daily basis    GOALS: Goals reviewed with patient? Yes  SHORT TERM GOALS: Target date: 01/04/23  Independent in initial HEP  Baseline: Goal status: INITIAL  2.  Increase AROM shoulder flexion and abduction by 5-10 degrees  Baseline:  Goal status: INITIAL   LONG TERM GOALS: Target date: 03/29/2023   Decrease pain in the Lt shoulder by 75-100% allowing patient to return to normal functional activities  Baseline:  Goal status: INITIAL  2.  AROM Lt shoulder WFL's and pain free  Baseline:  Goal status: INITIAL  3.  Patient reports return to normal  functional activities including lifting grandson with minimal to no increase in Lt shoulder pain  Baseline:  Goal status: INITIAL  4.  Independent in HEP  Baseline:  Goal status: INITIAL  PLAN:  PT FREQUENCY: 2x/week  PT DURATION: 8 weeks  PLANNED INTERVENTIONS: Therapeutic exercises, Therapeutic activity, Neuromuscular re-education, Patient/Family education, Self Care, Joint mobilization, Aquatic Therapy, Dry Needling, Electrical stimulation, Spinal mobilization, Cryotherapy, Moist heat, Taping, Vasopneumatic device, Ultrasound, Ionotophoresis 4mg /ml Dexamethasone, Manual therapy, and Re-evaluation  PLAN FOR NEXT SESSION: review and progress exercises; continue with postural correction and education; manual work, DN, modalities as indicated    [employer], PT 02/15/2023, 10:27 AM

## 2023-02-17 ENCOUNTER — Encounter: Payer: Self-pay | Admitting: Dietician

## 2023-02-17 ENCOUNTER — Encounter: Payer: BC Managed Care – PPO | Attending: Family Medicine | Admitting: Dietician

## 2023-02-17 DIAGNOSIS — E162 Hypoglycemia, unspecified: Secondary | ICD-10-CM | POA: Diagnosis not present

## 2023-02-17 NOTE — Patient Instructions (Addendum)
Continue to stay active. Continue mindful eating Options to add for variety - Joseph's Pita, Chick pea pasta, spaghetti squash, other winter squash (Consider portion size.  What is your tolerance?

## 2023-02-17 NOTE — Progress Notes (Signed)
Medical Nutrition Therapy  Appointment Start time:  1115  Appointment End time:  1155 Patient is here today alone.  She was last seen by this RD on 12/17/2022.  132 lbs She states that she is feeling better overall since last visit. 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  Medications have increased constipation and takes a stool softener with this.  She has since stopped the Percocet. Difficulty sleeping due to pain. More active - walking, plans on doing water exercises Currently in PT for her shoulder. She is not having as many lows.  She has increased variety more and added cottage cheese. Eating multigrain bread in small amounts - Ald[employer] diet [employer] bread OR [employer] Killer bread OR Wasa crackers (since going to [place] and tolerated well but small amounts in the [place]).  She has cheese or PB with them. Increased protein with breakfast (20-30 grams) and not waiting as long to eat. She is taking Liquid IV or Cure electrilte mix Tried PB powder and enjoys this in yogurt.  She added pumpkin seeds, chia seed pudding. Taking MVI, vitamin D/K2, magnesium glycinate, has not been taking calcium as much.  She is taking Acarbose. Less brain fog but now on muscle relaxer's and pain medication, poor sleep, gabapentin, menopause.  She is getting more dietary sources of Omega 3. Blurry vision when her blood sugar rises and falls quickly. Sensor readings less than 140. She will be going to [facility] this summer.  Primary concerns today: sudden brain fog, hot flashes, shaky, occasional tremor in her head that her husband notices, occasional pale or slurred words or palpitations or weakness in her legs, occasional blurry vision with drastic spike or plummet with CGM  Worst low currently around 6 am in the 50's to 60's Diagnosed with glucose tolerance test and went down to 30 at the 3 hour mark No tumors on pancrease or  liver per patient  Referral diagnosis: hypoglycemia Treats low blood glucose with a glucose tab only if she feels bad.  Sensor reading get to 50's and 60's.  Preferred learning style: no preference indicated Learning readiness:  ready, change in progress   NUTRITION ASSESSMENT   Anthropometrics  63" 132 lbs 02/17/2023 - lost to 128 lbs but has since regained 132 lbs  12/17/2022 stable for the past 6 months 144 lbs 2023 (purposely lost weight and as a result of a low carbohydrate diet)   Clinical Medical Hx: reactive hypoglycemia, osteopenia, ?adrenal fatigue, anemia, depression, fatty liver, post menopause, constipation Medications: see list to include acarbose Labs: C-reactive protein <5, sed rate 30, rheumatoid factor <7 on 12/16/2022, A1C 5.2%, cholesterol 228, HDL 72, LDL 137, Triglycerides 97, vitamin D 32 on 10/19/22, Notable Signs/Symptoms: see above in reason for visit CGM:  Freestyle Libre 3  *low blood glucose will not register on the report unless low for a specific amount of time. CGM Results from download: 12/17/2022 02/17/23  % Time CGM active:   91 %   (Goal >70%) 93  Average glucose:   91 mg/dL for 14 days 191 mg/dL for 14 days  Glucose management indicator:   5.5 % 5.8%  Time in range (70-180 mg/dL):   478 %   (Goal >29%) 98  Time High (181-250 mg/dL):   0 %   (Goal < 56%) 0  Time Very High (>250 mg/dL):    0 %   (Goal < 5%)  0  Time Low (54-69 mg/dL):   0 %   (Goal <1%) 2  Time Very Low (<54 mg/dL):   0 %   (Goal <6%) 0   Lifestyle & Dietary Hx Patient lives with her husband.  She is a homemake. 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  24-Hr Dietary Recall First Meal:  Greek yogurt with nuts, leftovers OR 2 eggs spinach, cherry tomatoes OR Wasa, cheese, PB or Nuts or leftover meat OR occasional protein shake (Premier or Glucerna) and nuts Snack: spoon of PB, Greek Yogurt Second Meal:   Hummus, cucumber, carrots, rotissorie chicken, blueberries, nuts Snack:  cheese, blueberries, 2 triscuits, PB, nuts Snack:  PB or jerky, cottage cheese Dinner 6:30-7:30:  hamburger with cheese, lettuce, tomato, bacon without bun, 1 fry, side salad Snack 10-10:30: spoon of PB, keto ice cream, small piece of dark chocolate Beverages: water, 2 cups decaf coffee with half and half, decaf green tea and black tea, NO alcohol, flavored seltzer water when out  Estimated Energy Needs Calories: 1800 Protein: 80g  NUTRITION DIAGNOSIS  NB-1.1 Food and nutrition-related knowledge deficit As related to nutrition and hypoglycemia.  As evidenced by diet hx and patient report.   NUTRITION INTERVENTION  Nutrition education (E-1) on the following topics:  CGM evaluation  small frequent meals which are high in protein Self evaluation Exercising  Meal ideas  Handouts Provided Include  Hypoglycemia Nutrition Therapy from AND  Learning Style & Readiness for Change Teaching method utilized: Visual & Auditory  Demonstrated degree of understanding via: Teach Back  Barriers to learning/adherence to lifestyle change: none  Goals Established by Pt Continue to listen to your body Consider a trial off coffee Continue to stay active. Continue mindful eating Options to add for variety - Joseph's Pita, Chick pea pasta, spaghetti squash, other winter squash (Consider portion size.  What is your tolerance?  MONITORING & EVALUATION Dietary intake, physical activity, and symptoms in 2 weeks.  Next Steps  Patient is to call for questions.

## 2023-02-21 ENCOUNTER — Encounter: Payer: Self-pay | Admitting: Rehabilitative and Restorative Service Providers"

## 2023-02-21 ENCOUNTER — Ambulatory Visit: Payer: BC Managed Care – PPO | Admitting: Rehabilitative and Restorative Service Providers"

## 2023-02-21 DIAGNOSIS — R29898 Other symptoms and signs involving the musculoskeletal system: Secondary | ICD-10-CM | POA: Diagnosis not present

## 2023-02-21 DIAGNOSIS — G8929 Other chronic pain: Secondary | ICD-10-CM

## 2023-02-21 DIAGNOSIS — M6281 Muscle weakness (generalized): Secondary | ICD-10-CM | POA: Diagnosis not present

## 2023-02-21 DIAGNOSIS — M25512 Pain in left shoulder: Secondary | ICD-10-CM | POA: Diagnosis not present

## 2023-02-21 DIAGNOSIS — M25511 Pain in right shoulder: Secondary | ICD-10-CM | POA: Diagnosis not present

## 2023-02-21 NOTE — Therapy (Signed)
OUTPATIENT PHYSICAL THERAPY SHOULDER TREATMENT   Patient Name: Lori King MRN: [id number] DOB:[birth year], 57 y.o., female Today's Date: 02/21/2023  END OF SESSION:  PT End of Session - 02/21/23 0928     Visit Number 12    Number of Visits 24    Date for PT Re-Evaluation 03/29/23    Authorization Type [employer] [id number]    PT Start Time 0928    PT Stop Time 1016    PT Time Calculation (min) 48 min    Activity Tolerance Patient tolerated treatment well             Past Medical History:  Diagnosis Date   Adhesive capsulitis of right shoulder    Allergic rhinitis, seasonal    Anemia    Anxiety    MDD (major depressive disorder)    Wears contact lenses    Past Surgical History:  Procedure Laterality Date   BREAST BIOPSY Left 2010   x2 per pt benign   CESAREAN SECTION  X2  last one 02-28-2001   W/  BILATERAL TUBAL LIGATION w/ last c/s   COLONOSCOPY  11/01/2017   HYSTEROSCOPY W/ ENDOMETRIAL ABLATION  2019   LUMBAR LAMINECTOMY/DECOMPRESSION MICRODISCECTOMY  2013   L4  --- S1   SHOULDER ARTHROSCOPY Right 08/28/2020   Procedure: ARTHROSCOPY SHOULDER lysis of adhesions and manipulation under anesthesia, extensive debridement;  Surgeon: Lori Kida, MD;  Location: [facility];  Service: Orthopedics;  Laterality: Right;   There are no problems to display for this patient.   PCP: Dr Lori King  REFERRING PROVIDER: Dr Lori King  REFERRING DIAG: pain of Lt shoulder  THERAPY DIAG:  Acute pain of left shoulder  Muscle weakness (generalized)  Other symptoms and signs involving the musculoskeletal system  Chronic right shoulder pain  Rationale for Evaluation and Treatment: Rehabilitation  ONSET DATE: 10/24/22  SUBJECTIVE:                                                                                                                                                                                      SUBJECTIVE  STATEMENT: Patient reports continued pain in the L UE symptoms over the past week. She is scheduled to see the orthopedist,Dr Lori King, tomorrow 02/22/23. [name] reports that the muscle relaxants and an antiinflammatory medication helps 'some' but she can definitely tell when it is time to take more medication. Pain in the L neck and shoulder area continue with sharp pains in the shoulder at times. She has tingling and numbness in the ring and little fingers and the thumb on an intermittent basis. She is having difficulty getting comfortable to sleep. She is  on vacation the first week in July.   EVAL: Patient reports that she was lifting her luggage out of the overhead bin when she felt pain in the Lt shoulder and has had some pain in an intermittent basis. She had an injection in Lt shoulder 12/22/22 with some improvement but shoulder is stiff and painful.  Hand dominance: Right  PERTINENT HISTORY: Rt shoulder pain 3 yrs with shoulder surgery for labrum tear and biceps tendon; adhesive capsulitis; reactive hypoglycemic disease; fatty liver  PAIN:  Are you having pain? Yes: NPRS scale: 9/10 with movement; 7-8/10 at rest; at worst 9-10/10 Pain location: R shoulder/arm  Pain description: sharp; stabbing pain in biceps radiating to elbow with big reaching to the side  Aggravating factors: reaching overhead to the side and behind  Relieving factors: ice; TENs unit   PRECAUTIONS: None  WEIGHT BEARING RESTRICTIONS: No  FALLS:  Has patient fallen in last 6 months? No   PATIENT GOALS:get rid of the shoulder pain and increase ROM   NEXT MD VISIT: Dr Lori King 01/11/23; (Dr Lori King next week)   OBJECTIVE:   DIAGNOSTIC FINDINGS:  None available for shoulder; x-ray (-)   02/13/23 MRI Lt shoulder -  some joint space narrowing, subchondral sclerosis, subchondral cyst formation and osteophyte formation in the glenohumeral joint, indicative of osteoarthritis.  POSTURE: Patient presents with head forward  posture with increased thoracic kyphosis; shoulders rounded and elevated; scapulae abducted and rotated along the thoracic spine; head of the humerus anterior in orientation.  CERVICAL ROM: 02/02/23 Flexion 43 tight pain Lt upper trap  Extension 48 tight ant/post neck R lateral flexion 36 tight L cervical  L lateral flexion 42 R rotation 71 L rotation 62 tight pain Lt cervical to upper trap    UPPER EXTREMITY ROM:   Active ROM Right eval Left eval Left  01/11/23 Left  02/02/23  Shoulder flexion 160 112 130 107 pain   Shoulder extension 59 25 48 31 pain   Shoulder abduction 168 112 133 92 pain   Shoulder adduction      Shoulder internal rotation T7 Thumb to waist Thumb  T12 Hand lateral hip pain   Shoulder external rotation 95 85 93 45 pain   Elbow flexion      Elbow extension      Wrist flexion      Wrist extension      Wrist ulnar deviation      Wrist radial deviation      Wrist pronation      Wrist supination      (Blank rows = not tested) PASSIVE ROM - supine flexion 152; scaption 147; ER 90  UPPER EXTREMITY MMT: strength not assessed - moves Lt UE well against gravity; limited by pain  MMT Right eval Left eval  Shoulder flexion    Shoulder extension    Shoulder abduction    Shoulder adduction    Shoulder internal rotation    Shoulder external rotation    Middle trapezius    Lower trapezius    Elbow flexion    Elbow extension    Wrist flexion    Wrist extension    Wrist ulnar deviation    Wrist radial deviation    Wrist pronation    Wrist supination    Grip strength (lbs)    (Blank rows = not tested)  JOINT MOBILITY TESTING:  Significant joint tightness Lt shoulder in all planes  02/02/23: Significant joint tightness Lt shoulder in all planes  UPPER LIMB TENSION  TEST:  Positive L with patient unable to achieve 90 deg shoulder abduction for testing. UE in ~ 60 deg abduction with elbow extension produced radicular symptoms into Lt UE   PALPATION:  Muscular  tightness Lt shoulder through the upper trap; pecs; leveator; teres 02/02/23: muscular tightness Lt shoulder girdle in upper trap; pecs; leveator; rhomboids; teres 02/09/23: muscular tightness Lt cervical and upper trap area through the shoulder girdle including pecs; leveator; rhomboids; teres 02/15/23: persistent tightness L cervical and upper trap area   [facility] PT Treatment:                                                DATE: 02/21/23 Therapeutic Exercise: Sitting  Chin tuck  Supine  Chin tuck/neutral spine Manual Therapy: Pt supine for STM working through the cervical musculature and pecs; upper trap; leveator; biceps; deltoid  Focus on STM through the L cervical area with good release of muscular tightness noted   STM/MFR through thoracic paraspinals and into Lt shoulder girdle Skilled palpation to assess response to manual work and DN Trigger Point Dry-Needling  Treatment instructions: Expect mild to moderate muscle soreness. S/S of pneumothorax if dry needled over a lung field, and to seek immediate medical attention should they occur. Patient verbalized understanding of these instructions and education.  Patient Consent Given: Yes Education handout provided: Previously provided Muscles treated: L lateral cervical; upper trap  Electrical stimulation performed: No Parameters: N/A Treatment response/outcome: decreased palpable tightness and decreased radicular symptoms L UE  Joint mobilization Lt shoulder and PROM/stretching pt in supine  Modalities: Moist heat thoracic spine; Lt shoulder x 10 min  [facility] Adult PT Treatment:                                                DATE: 02/15/23 Therapeutic Exercise: Sitting  Chin tuck  Supine  Chin tuck/neutral spine Manual Therapy: Pt supine for STM working through the cervical musculature and pecs; upper trap; leveator; biceps; deltoid  Focus on STM through the L cervical area with good release of muscular tightness noted   STM/MFR  through thoracic paraspinals and into Lt shoulder girdle Skilled palpation to assess response to manual work and DN Trigger Point Dry-Needling  Treatment instructions: Expect mild to moderate muscle soreness. S/S of pneumothorax if dry needled over a lung field, and to seek immediate medical attention should they occur. Patient verbalized understanding of these instructions and education.  Patient Consent Given: Yes Education handout provided: Previously provided Muscles treated: L lateral cervical; upper trap  Electrical stimulation performed: No Parameters: N/A Treatment response/outcome: decreased palpable tightness and decreased radicular symptoms L UE  Joint mobilization Lt shoulder and PROM/stretching pt in supine  Kinesotaping inhibition of upper trap and correct anterior instability L shoulder  Modalities: Moist heat thoracic spine; Lt shoulder x 10 min  [facility] Adult PT Treatment:                                                DATE: 02/09/23 Therapeutic Exercise: Sitting  Pulley flexion 3 sec hold x 15 Pulley horizontal ab/ad  in neutral x 15  Lateral cervical flexion stretch in chin tuck 10 sec x 5  Leveator stretch in chin tuck 10 sec x 5     Manual Therapy:  Pt supine for STM working through the cervical musculature and pecs; upper trap; leveator; biceps; deltoid   STM/MFR through thoracic paraspinals and into Lt shoulder girdle Joint mobilization Lt shoulder and PROM/stretching pt in supine  Kinesotaping unload the deltoid and inhibition of upper trap  Modalities: Moist heat thoracic spine; Lt shoulder x 10 min  [facility] Adult PT Treatment:                                                DATE: 02/02/23 Therapeutic Exercise: Sitting  Pulley flexion 3 sec hold x 15 Pulley horizontal ab/ad in neutral x 15  Lateral cervical flexion stretch in chin tuck 10 sec x 5  Leveator stretch in chin tuck 10 sec x 5   Quadruped   Manual Therapy:  Pt prone working through the pecs;  upper trap; leveator; biceps; deltoid   STM/MFR through thoracic paraspinals and into Lt shoulder girdle Joint mobilization Lt shoulder and PROM/stretching pt in supine  Skilled palpation to assess response to DN and manual work  Psychiatrist  Treatment instructions: Expect mild to moderate muscle soreness. S/S of pneumothorax if dry needled over a lung field, and to seek immediate medical attention should they occur. Patient verbalized understanding of these instructions and education.  Patient Consent Given: Yes Education handout provided: Previously provided Muscles treated: Lt rhomboids; middle trap; upper trap; cervical paraspinals ~ C6/7 Electrical stimulation performed:yes  Parameters: mAmp current intensity adjusted to pt tolerance  Treatment response/outcome: decreased palpable tightness noted following treatment    Modalities: Moist heat thoracic spine; cold pac Lt shoulder x 10 min   PATIENT EDUCATION: Education details: POC; HEP Person educated: Patient Education method: Programmer, multimedia, Demonstration, Tactile cues, Verbal cues, and Handouts Education comprehension: verbalized understanding, returned demonstration, verbal cues required, tactile cues required, and needs further education  HOME EXERCISE PROGRAM:  E-mailed to: [email or web address]  Access Code: [email or web address] URL: [email or web address] Date: 01/13/2023 Prepared by: Corlis Leak  Exercises - Seated Shoulder Flexion AAROM with Pulley Behind  - 2 x daily - 7 x weekly - 1 sets - 10 reps - 10 sec  hold - Seated Shoulder Scaption AAROM with Pulley at Side  - 2 x daily - 7 x weekly - 1 sets - 10 reps - 10sec  hold - Seated Cervical Retraction  - 2 x daily - 7 x weekly - 1-2 sets - 5-10 reps - 10 sec  hold - Seated Scapular Retraction  - 2 x daily - 7 x weekly - 1-2 sets - 10 reps - 10 sec  hold - Standing Scapular Retraction  - 3 x daily - 7 x weekly - 1 sets - 10 reps - 10 sec  hold - Shoulder  External Rotation and Scapular Retraction  - 3 x daily - 7 x weekly - 1 sets - 10 reps - 3-5 sec   hold - Seated Shoulder W  - 2 x daily - 7 x weekly - 1 sets - 10 reps - 3 sec  hold - Shoulder External Rotation and Scapular Retraction with Resistance  - 2 x daily - 7 x weekly - 1 sets - 10 reps - 3-5 sec  hold - Shoulder W - External Rotation with Resistance  - 2 x daily - 7 x weekly - 1-2 sets - 10 reps - 3 sec  hold - Standing Bilateral Low Shoulder Row with Anchored Resistance  - 2 x daily - 7 x weekly - 1-3 sets - 10 reps - 2-3 sec  hold - Drawing Bow  - 1 x daily - 7 x weekly - 1 sets - 10 reps - 3 sec  hold - Prone Scapular Retraction  - 2 x daily - 7 x weekly - 1 sets - 5-10 reps - 3-5 sec  hold - Cat Cow  - 2 x daily - 7 x weekly - 1 sets - 5-10 reps - 3-5 sec  hold - Cat Cow to Child's Pose  - 2 x daily - 7 x weekly - 1 sets - 3 reps - 20-30 sec  hold  ASSESSMENT:  CLINICAL IMPRESSION: Patient reports continued increased pain which has been worse at times. She did irritate with shoulder with movements of the L UE when driving Friday. She is continuing with muscle relaxant and antiinflammatory which has helped some. She is still flared up from the incident with L shoulder when she was getting her bag from the overhead bin in the airplane. Evaluation and treatment today continue to  indicate cervical involvement of L UE symptoms. She has positive ULTT and muscular tightness to palpation through the L cervical and upper trap/clavicular area. Responds favorably to intervention through the L cervical spine but decreased symptoms are temporary. Continue axial extension in supine and neural mobilization as pt tolerates. MRI shows arthritis in L shoulder but no RC pathology.   OBJECTIVE IMPAIRMENTS: Lt shoulder pain which started with lifting overhead her luggage when travelling. She has had gradually increasing pain and stiffness in the Lt shoulder. She received an injection ~ 1-2 weeks ago with  some improvement. Patient presents with poor posture and alignment; limited A and PROM Lt shoulder; functional weakness due to pain; decreased functional activities and pain on a daily basis    GOALS: Goals reviewed with patient? Yes  SHORT TERM GOALS: Target date: 01/04/23  Independent in initial HEP  Baseline: Goal status: INITIAL  2.  Increase AROM shoulder flexion and abduction by 5-10 degrees  Baseline:  Goal status: INITIAL   LONG TERM GOALS: Target date: 03/29/2023   Decrease pain in the Lt shoulder by 75-100% allowing patient to return to normal functional activities  Baseline:  Goal status: INITIAL  2.  AROM Lt shoulder WFL's and pain free  Baseline:  Goal status: INITIAL  3.  Patient reports return to normal functional activities including lifting grandson with minimal to no increase in Lt shoulder pain  Baseline:  Goal status: INITIAL  4.  Independent in HEP  Baseline:  Goal status: INITIAL  PLAN:  PT FREQUENCY: 2x/week  PT DURATION: 8 weeks  PLANNED INTERVENTIONS: Therapeutic exercises, Therapeutic activity, Neuromuscular re-education, Patient/Family education, Self Care, Joint mobilization, Aquatic Therapy, Dry Needling, Electrical stimulation, Spinal mobilization, Cryotherapy, Moist heat, Taping, Vasopneumatic device, Ultrasound, Ionotophoresis 4mg /ml Dexamethasone, Manual therapy, and Re-evaluation  PLAN FOR NEXT SESSION: review and progress exercises; continue with postural correction and education; manual work, DN, modalities as indicated    [employer], PT 02/21/2023, 9:29 AM

## 2023-02-22 DIAGNOSIS — M7502 Adhesive capsulitis of left shoulder: Secondary | ICD-10-CM | POA: Diagnosis not present

## 2023-02-22 DIAGNOSIS — M542 Cervicalgia: Secondary | ICD-10-CM | POA: Diagnosis not present

## 2023-02-23 DIAGNOSIS — F329 Major depressive disorder, single episode, unspecified: Secondary | ICD-10-CM | POA: Diagnosis not present

## 2023-02-23 DIAGNOSIS — M7502 Adhesive capsulitis of left shoulder: Secondary | ICD-10-CM | POA: Diagnosis not present

## 2023-02-24 ENCOUNTER — Ambulatory Visit: Payer: BC Managed Care – PPO | Admitting: Rehabilitative and Restorative Service Providers"

## 2023-02-27 DIAGNOSIS — M542 Cervicalgia: Secondary | ICD-10-CM | POA: Diagnosis not present

## 2023-03-07 ENCOUNTER — Encounter: Payer: Self-pay | Admitting: Rehabilitative and Restorative Service Providers"

## 2023-03-07 ENCOUNTER — Ambulatory Visit: Payer: BC Managed Care – PPO | Attending: Family Medicine | Admitting: Rehabilitative and Restorative Service Providers"

## 2023-03-07 DIAGNOSIS — M25511 Pain in right shoulder: Secondary | ICD-10-CM | POA: Diagnosis not present

## 2023-03-07 DIAGNOSIS — M25512 Pain in left shoulder: Secondary | ICD-10-CM | POA: Insufficient documentation

## 2023-03-07 DIAGNOSIS — M6281 Muscle weakness (generalized): Secondary | ICD-10-CM | POA: Diagnosis not present

## 2023-03-07 DIAGNOSIS — G8929 Other chronic pain: Secondary | ICD-10-CM | POA: Diagnosis not present

## 2023-03-07 DIAGNOSIS — R29898 Other symptoms and signs involving the musculoskeletal system: Secondary | ICD-10-CM | POA: Diagnosis not present

## 2023-03-07 NOTE — Therapy (Signed)
OUTPATIENT PHYSICAL THERAPY SHOULDER TREATMENT   Patient Name: Lori King MRN: [id number] DOB:1966/07/09, 57 y.o., female Today's Date: 03/07/2023  END OF SESSION:  PT End of Session - 03/07/23 1014     Visit Number 13    Number of Visits 24    Date for PT Re-Evaluation 03/29/23    Authorization Type [employer] [id number]    PT Start Time 1013    PT Stop Time 1102    PT Time Calculation (min) 49 min    Activity Tolerance Patient tolerated treatment well             Past Medical History:  Diagnosis Date   Adhesive capsulitis of right shoulder    Allergic rhinitis, seasonal    Anemia    Anxiety    MDD (major depressive disorder)    Wears contact lenses    Past Surgical History:  Procedure Laterality Date   BREAST BIOPSY Left 2010   x2 per pt benign   CESAREAN SECTION  X2  last one 02-28-2001   W/  BILATERAL TUBAL LIGATION w/ last c/s   COLONOSCOPY  11/01/2017   HYSTEROSCOPY W/ ENDOMETRIAL ABLATION  2019   LUMBAR LAMINECTOMY/DECOMPRESSION MICRODISCECTOMY  2013   L4  --- S1   SHOULDER ARTHROSCOPY Right 08/28/2020   Procedure: ARTHROSCOPY SHOULDER lysis of adhesions and manipulation under anesthesia, extensive debridement;  Surgeon: Yolonda Kida, MD;  Location: [facility];  Service: Orthopedics;  Laterality: Right;   There are no problems to display for this patient.   PCP: Dr Angelica Chessman  REFERRING PROVIDER: Dr Arlyce Harman  REFERRING DIAG: pain of Lt shoulder  THERAPY DIAG:  Acute pain of left shoulder  Muscle weakness (generalized)  Other symptoms and signs involving the musculoskeletal system  Chronic right shoulder pain  Rationale for Evaluation and Treatment: Rehabilitation  ONSET DATE: 10/24/22  SUBJECTIVE:                                                                                                                                                                                      SUBJECTIVE STATEMENT: Patient  reports continued pain in the L UE symptoms. She has stiffness and tightness in the L shoulder and the shoulder is tighter. She does not have the results of the MRI. She is scheduled to see the orthopedist, Dr Aundria Rud, tomorrow 03/17/23. She does not know results of neck MRI. [name] reports that she is taking gabapentin 3 times a day;  the muscle relaxants every 6 hours; finished a steroid dose pack; percocet as needed. She started the antiinflammatory medication this morning. She has less of the sharp pain and can  tell the medication is helping. She received a steroid injection with [place]. Pain in the L neck and shoulder area continues. She has tingling and numbness in the ring and little fingers and the thumb and now in the palm of her hand on an intermittent basis. She is having difficulty getting comfortable to sleep.    EVAL: Patient reports that she was lifting her luggage out of the overhead bin when she felt pain in the Lt shoulder and has had some pain in an intermittent basis. She had an injection in Lt shoulder 12/22/22 with some improvement but shoulder is stiff and painful.  Hand dominance: Right  PERTINENT HISTORY: Rt shoulder pain 3 yrs with shoulder surgery for labrum tear and biceps tendon; adhesive capsulitis; reactive hypoglycemic disease; fatty liver  PAIN:  Are you having pain? Yes: NPRS scale: 7/10 with movement and on meds; at worst 9-10/10 Pain location: R shoulder/arm  Pain description: sharp; stabbing pain in biceps radiating to elbow with big reaching to the side  Aggravating factors: reaching overhead to the side and behind  Relieving factors: ice; TENs unit   PRECAUTIONS: None  WEIGHT BEARING RESTRICTIONS: No  FALLS:  Has patient fallen in last 6 months? No   PATIENT GOALS:get rid of the shoulder pain and increase ROM   NEXT MD VISIT: Dr Karen Chafe 01/11/23; (Dr Aundria Rud next week)   OBJECTIVE:   DIAGNOSTIC FINDINGS:  None available for shoulder; x-ray (-)   02/13/23  MRI Lt shoulder -  some joint space narrowing, subchondral sclerosis, subchondral cyst formation and osteophyte formation in the glenohumeral joint, indicative of osteoarthritis.  POSTURE: Patient presents with head forward posture with increased thoracic kyphosis; shoulders rounded and elevated; scapulae abducted and rotated along the thoracic spine; head of the humerus anterior in orientation.  CERVICAL ROM: 02/02/23 Flexion 43 tight pain Lt upper trap  Extension 48 tight ant/post neck R lateral flexion 36 tight L cervical  L lateral flexion 42 R rotation 71 L rotation 62 tight pain Lt cervical to upper trap    UPPER EXTREMITY ROM:   Active ROM Right eval Left eval Left  01/11/23 Left  02/02/23  Shoulder flexion 160 112 130 107 pain   Shoulder extension 59 25 48 31 pain   Shoulder abduction 168 112 133 92 pain   Shoulder adduction      Shoulder internal rotation T7 Thumb to waist Thumb  T12 Hand lateral hip pain   Shoulder external rotation 95 85 93 45 pain   Elbow flexion      Elbow extension      Wrist flexion      Wrist extension      Wrist ulnar deviation      Wrist radial deviation      Wrist pronation      Wrist supination      (Blank rows = not tested) PASSIVE ROM - supine flexion 152; scaption 147; ER 90  UPPER EXTREMITY MMT: strength not assessed - moves Lt UE well against gravity; limited by pain  MMT Right eval Left eval  Shoulder flexion    Shoulder extension    Shoulder abduction    Shoulder adduction    Shoulder internal rotation    Shoulder external rotation    Middle trapezius    Lower trapezius    Elbow flexion    Elbow extension    Wrist flexion    Wrist extension    Wrist ulnar deviation    Wrist radial deviation  Wrist pronation    Wrist supination    Grip strength (lbs)    (Blank rows = not tested)  JOINT MOBILITY TESTING:  Significant joint tightness Lt shoulder in all planes  02/02/23: Significant joint tightness Lt shoulder in all  planes  UPPER LIMB TENSION TEST:  Positive L with patient unable to achieve 90 deg shoulder abduction for testing. UE in ~ 60 deg abduction with elbow extension produced radicular symptoms into Lt UE   PALPATION:  Muscular tightness Lt shoulder through the upper trap; pecs; leveator; teres 02/02/23: muscular tightness Lt shoulder girdle in upper trap; pecs; leveator; rhomboids; teres 02/09/23: muscular tightness Lt cervical and upper trap area through the shoulder girdle including pecs; leveator; rhomboids; teres 02/15/23: persistent tightness L cervical and upper trap area    [facility] Adult PT Treatment:                                                DATE: 03/07/23 Therapeutic Exercise: Sitting  Chin tuck  Supine  Chin tuck/neutral spine Manual Therapy: Pt supine for STM working through the cervical musculature and pecs; upper trap; leveator; biceps; deltoid  Focus on STM through the L cervical area with good release of muscular tightness noted   STM/MFR through thoracic paraspinals and into Lt shoulder girdle Joint mobilization Lt shoulder and PROM/stretching pt in supine  Modalities: Moist heat thoracic spine; Lt shoulder x 10 min  [facility] Adult PT Treatment:                                                DATE: 02/21/23 Therapeutic Exercise: Sitting  Chin tuck  Supine  Chin tuck/neutral spine Manual Therapy: Pt supine for STM working through the cervical musculature and pecs; upper trap; leveator; biceps; deltoid  Focus on STM through the L cervical area with good release of muscular tightness noted   STM/MFR through thoracic paraspinals and into Lt shoulder girdle Skilled palpation to assess response to manual work and DN Trigger Point Dry-Needling  Treatment instructions: Expect mild to moderate muscle soreness. S/S of pneumothorax if dry needled over a lung field, and to seek immediate medical attention should they occur. Patient verbalized understanding of these instructions and  education.  Patient Consent Given: Yes Education handout provided: Previously provided Muscles treated: L lateral cervical; upper trap  Electrical stimulation performed: No Parameters: N/A Treatment response/outcome: decreased palpable tightness and decreased radicular symptoms L UE  Joint mobilization Lt shoulder and PROM/stretching pt in supine  Modalities: Moist heat thoracic spine; Lt shoulder x 10 min  [facility] Adult PT Treatment:                                                DATE: 02/15/23 Therapeutic Exercise: Sitting  Chin tuck  Supine  Chin tuck/neutral spine Manual Therapy: Pt supine for STM working through the cervical musculature and pecs; upper trap; leveator; biceps; deltoid  Focus on STM through the L cervical area with good release of muscular tightness noted   STM/MFR through thoracic paraspinals and into Lt shoulder girdle Skilled palpation to assess response  to manual work and DN Trigger Point Dry-Needling  Treatment instructions: Expect mild to moderate muscle soreness. S/S of pneumothorax if dry needled over a lung field, and to seek immediate medical attention should they occur. Patient verbalized understanding of these instructions and education.  Patient Consent Given: Yes Education handout provided: Previously provided Muscles treated: L lateral cervical; upper trap  Electrical stimulation performed: No Parameters: N/A Treatment response/outcome: decreased palpable tightness and decreased radicular symptoms L UE  Joint mobilization Lt shoulder and PROM/stretching pt in supine  Kinesotaping inhibition of upper trap and correct anterior instability L shoulder  Modalities: Moist heat thoracic spine; Lt shoulder x 10 min  [facility] Adult PT Treatment:                                                DATE: 02/09/23 Therapeutic Exercise: Sitting  Pulley flexion 3 sec hold x 15 Pulley horizontal ab/ad in neutral x 15  Lateral cervical flexion stretch in chin tuck 10  sec x 5  Leveator stretch in chin tuck 10 sec x 5     Manual Therapy:  Pt supine for STM working through the cervical musculature and pecs; upper trap; leveator; biceps; deltoid   STM/MFR through thoracic paraspinals and into Lt shoulder girdle Joint mobilization Lt shoulder and PROM/stretching pt in supine  Kinesotaping unload the deltoid and inhibition of upper trap  Modalities: Moist heat thoracic spine; Lt shoulder x 10 min   PATIENT EDUCATION: Education details: POC; HEP Person educated: Patient Education method: Explanation, Demonstration, Tactile cues, Verbal cues, and Handouts Education comprehension: verbalized understanding, returned demonstration, verbal cues required, tactile cues required, and needs further education  HOME EXERCISE PROGRAM:  E-mailed to: [email or web address]  Access Code: [email or web address] URL: [email or web address] Date: 01/13/2023 Prepared by: Corlis Leak  Exercises - Seated Shoulder Flexion AAROM with Pulley Behind  - 2 x daily - 7 x weekly - 1 sets - 10 reps - 10 sec  hold - Seated Shoulder Scaption AAROM with Pulley at Side  - 2 x daily - 7 x weekly - 1 sets - 10 reps - 10sec  hold - Seated Cervical Retraction  - 2 x daily - 7 x weekly - 1-2 sets - 5-10 reps - 10 sec  hold - Seated Scapular Retraction  - 2 x daily - 7 x weekly - 1-2 sets - 10 reps - 10 sec  hold - Standing Scapular Retraction  - 3 x daily - 7 x weekly - 1 sets - 10 reps - 10 sec  hold - Shoulder External Rotation and Scapular Retraction  - 3 x daily - 7 x weekly - 1 sets - 10 reps - 3-5 sec   hold - Seated Shoulder W  - 2 x daily - 7 x weekly - 1 sets - 10 reps - 3 sec  hold - Shoulder External Rotation and Scapular Retraction with Resistance  - 2 x daily - 7 x weekly - 1 sets - 10 reps - 3-5 sec  hold - Shoulder W - External Rotation with Resistance  - 2 x daily - 7 x weekly - 1-2 sets - 10 reps - 3 sec  hold - Standing Bilateral Low Shoulder Row with Anchored Resistance  -  2 x daily - 7 x weekly - 1-3 sets - 10 reps - 2-3 sec  hold - Drawing [place]  -  1 x daily - 7 x weekly - 1 sets - 10 reps - 3 sec  hold - Prone Scapular Retraction  - 2 x daily - 7 x weekly - 1 sets - 5-10 reps - 3-5 sec  hold - Cat Cow  - 2 x daily - 7 x weekly - 1 sets - 5-10 reps - 3-5 sec  hold - Cat Cow to Child's Pose  - 2 x daily - 7 x weekly - 1 sets - 3 reps - 20-30 sec  hold  ASSESSMENT:  CLINICAL IMPRESSION: Patient reports continued pain which is variable in intensity. She was seen by MD and has had a cervical MRI but she does not have results and will not see MD until 03/17/23. Patient has continued radicular UE symptoms that seem cervical in nature. Shoulder ROM is decreased and patient is unable to tolerate PROM/stretching for L shoulder. We will continue with treatment as tolerated and patient schedules. She is continuing with meds and exercises as tolerated from HEP. She continues to have cervical involvement of L UE symptoms as well as shoulder pathology. She has positive ULTT and muscular tightness to palpation through the L cervical and upper trap/clavicular area. Responds favorably to intervention through the L cervical spine but decreased symptoms are temporary. Continue axial extension in supine and neural mobilization as pt tolerates. MRI shows arthritis in L shoulder but no RC pathology.   OBJECTIVE IMPAIRMENTS: Lt shoulder pain which started with lifting overhead her luggage when travelling. She has had gradually increasing pain and stiffness in the Lt shoulder. She received an injection ~ 1-2 weeks ago with some improvement. Patient presents with poor posture and alignment; limited A and PROM Lt shoulder; functional weakness due to pain; decreased functional activities and pain on a daily basis    GOALS: Goals reviewed with patient? Yes  SHORT TERM GOALS: Target date: 01/04/23  Independent in initial HEP  Baseline: Goal status: INITIAL  2.  Increase AROM shoulder flexion  and abduction by 5-10 degrees  Baseline:  Goal status: INITIAL   LONG TERM GOALS: Target date: 03/29/2023   Decrease pain in the Lt shoulder by 75-100% allowing patient to return to normal functional activities  Baseline:  Goal status: INITIAL  2.  AROM Lt shoulder WFL's and pain free  Baseline:  Goal status: INITIAL  3.  Patient reports return to normal functional activities including lifting grandson with minimal to no increase in Lt shoulder pain  Baseline:  Goal status: INITIAL  4.  Independent in HEP  Baseline:  Goal status: INITIAL  PLAN:  PT FREQUENCY: 2x/week  PT DURATION: 8 weeks  PLANNED INTERVENTIONS: Therapeutic exercises, Therapeutic activity, Neuromuscular re-education, Patient/Family education, Self Care, Joint mobilization, Aquatic Therapy, Dry Needling, Electrical stimulation, Spinal mobilization, Cryotherapy, Moist heat, Taping, Vasopneumatic device, Ultrasound, Ionotophoresis 4mg /ml Dexamethasone, Manual therapy, and Re-evaluation  PLAN FOR NEXT SESSION: review and progress exercises; continue with postural correction and education; manual work, DN, modalities as indicated    [employer], PT 03/07/2023, 10:15 AM

## 2023-03-10 ENCOUNTER — Encounter: Payer: BC Managed Care – PPO | Admitting: Rehabilitative and Restorative Service Providers"

## 2023-03-11 DIAGNOSIS — Z1329 Encounter for screening for other suspected endocrine disorder: Secondary | ICD-10-CM | POA: Diagnosis not present

## 2023-03-11 DIAGNOSIS — M25519 Pain in unspecified shoulder: Secondary | ICD-10-CM | POA: Diagnosis not present

## 2023-03-11 DIAGNOSIS — E538 Deficiency of other specified B group vitamins: Secondary | ICD-10-CM | POA: Diagnosis not present

## 2023-03-11 DIAGNOSIS — F419 Anxiety disorder, unspecified: Secondary | ICD-10-CM | POA: Diagnosis not present

## 2023-03-11 DIAGNOSIS — R5383 Other fatigue: Secondary | ICD-10-CM | POA: Diagnosis not present

## 2023-03-11 DIAGNOSIS — Z131 Encounter for screening for diabetes mellitus: Secondary | ICD-10-CM | POA: Diagnosis not present

## 2023-03-11 DIAGNOSIS — R251 Tremor, unspecified: Secondary | ICD-10-CM | POA: Diagnosis not present

## 2023-03-11 DIAGNOSIS — R42 Dizziness and giddiness: Secondary | ICD-10-CM | POA: Diagnosis not present

## 2023-03-11 DIAGNOSIS — E559 Vitamin D deficiency, unspecified: Secondary | ICD-10-CM | POA: Diagnosis not present

## 2023-03-14 ENCOUNTER — Encounter: Payer: BC Managed Care – PPO | Admitting: Rehabilitative and Restorative Service Providers"

## 2023-03-14 DIAGNOSIS — M7522 Bicipital tendinitis, left shoulder: Secondary | ICD-10-CM | POA: Diagnosis not present

## 2023-03-14 DIAGNOSIS — M24112 Other articular cartilage disorders, left shoulder: Secondary | ICD-10-CM | POA: Diagnosis not present

## 2023-03-14 DIAGNOSIS — M7552 Bursitis of left shoulder: Secondary | ICD-10-CM | POA: Diagnosis not present

## 2023-03-14 DIAGNOSIS — G8918 Other acute postprocedural pain: Secondary | ICD-10-CM | POA: Diagnosis not present

## 2023-03-14 DIAGNOSIS — M7502 Adhesive capsulitis of left shoulder: Secondary | ICD-10-CM | POA: Diagnosis not present

## 2023-03-14 DIAGNOSIS — S43432A Superior glenoid labrum lesion of left shoulder, initial encounter: Secondary | ICD-10-CM | POA: Diagnosis not present

## 2023-03-14 DIAGNOSIS — M7582 Other shoulder lesions, left shoulder: Secondary | ICD-10-CM | POA: Diagnosis not present

## 2023-03-14 DIAGNOSIS — M7542 Impingement syndrome of left shoulder: Secondary | ICD-10-CM | POA: Diagnosis not present

## 2023-03-14 DIAGNOSIS — M70912 Unspecified soft tissue disorder related to use, overuse and pressure, left shoulder: Secondary | ICD-10-CM | POA: Diagnosis not present

## 2023-03-15 ENCOUNTER — Encounter: Payer: Self-pay | Admitting: Rehabilitative and Restorative Service Providers"

## 2023-03-15 ENCOUNTER — Ambulatory Visit: Payer: BC Managed Care – PPO | Admitting: Rehabilitative and Restorative Service Providers"

## 2023-03-15 DIAGNOSIS — M25512 Pain in left shoulder: Secondary | ICD-10-CM

## 2023-03-15 DIAGNOSIS — G8929 Other chronic pain: Secondary | ICD-10-CM

## 2023-03-15 DIAGNOSIS — M25511 Pain in right shoulder: Secondary | ICD-10-CM | POA: Diagnosis not present

## 2023-03-15 DIAGNOSIS — M6281 Muscle weakness (generalized): Secondary | ICD-10-CM

## 2023-03-15 DIAGNOSIS — R29898 Other symptoms and signs involving the musculoskeletal system: Secondary | ICD-10-CM | POA: Diagnosis not present

## 2023-03-15 NOTE — Therapy (Signed)
OUTPATIENT PHYSICAL THERAPY SHOULDER TREATMENT   Patient Name: Lori King MRN: [id number] DOB:February 05, 1966, 57 y.o., female Today's Date: 03/15/2023  END OF SESSION:  PT End of Session - 03/15/23 1021     Visit Number 14    Number of Visits 24    Date for PT Re-Evaluation 03/29/23    Authorization Type [employer] [id number]    PT Start Time 1020    PT Stop Time 1108    PT Time Calculation (min) 48 min    Activity Tolerance Patient tolerated treatment well;Patient limited by pain             Past Medical History:  Diagnosis Date   Adhesive capsulitis of right shoulder    Allergic rhinitis, seasonal    Anemia    Anxiety    MDD (major depressive disorder)    Wears contact lenses    Past Surgical History:  Procedure Laterality Date   BREAST BIOPSY Left 2010   x2 per pt benign   CESAREAN SECTION  X2  last one 02-28-2001   W/  BILATERAL TUBAL LIGATION w/ last c/s   COLONOSCOPY  11/01/2017   HYSTEROSCOPY W/ ENDOMETRIAL ABLATION  2019   LUMBAR LAMINECTOMY/DECOMPRESSION MICRODISCECTOMY  2013   L4  --- S1   SHOULDER ARTHROSCOPY Right 08/28/2020   Procedure: ARTHROSCOPY SHOULDER lysis of adhesions and manipulation under anesthesia, extensive debridement;  Surgeon: Yolonda Kida, MD;  Location: [facility];  Service: Orthopedics;  Laterality: Right;   There are no problems to display for this patient.   PCP: Dr Angelica Chessman  REFERRING PROVIDER: Dr Arlyce Harman  REFERRING DIAG: pain of Lt shoulder  THERAPY DIAG:  Acute pain of left shoulder  Muscle weakness (generalized)  Other symptoms and signs involving the musculoskeletal system  Chronic right shoulder pain  Rationale for Evaluation and Treatment: Rehabilitation  ONSET DATE: 10/24/22  SUBJECTIVE:                                                                                                                                                                                       SUBJECTIVE STATEMENT: Patient underwent L shoulder surgery with extensive debridement; labral repair; biceps tendon release; removal of bone spurs 03/14/23.  She has minimal pain due to remaining effects of spinal block for surgery. Bulky dressing limits movement and the effect of ice. Remains in the sling.   Cervical MRI shows degenerative changes C5/6. MD will send order for cervical spine.   Patient reports continued pain in the L UE symptoms. She has stiffness and tightness in the L shoulder and the shoulder is tighter. She does not have the  results of the MRI. She is scheduled to see the orthopedist, Dr Aundria Rud, tomorrow 03/17/23. She does not know results of neck MRI. [name] reports that she is taking gabapentin 3 times a day;  the muscle relaxants every 6 hours; finished a steroid dose pack; percocet as needed. She started the antiinflammatory medication this morning. She has less of the sharp pain and can tell the medication is helping. She received a steroid injection with [place]. Pain in the L neck and shoulder area continues. She has tingling and numbness in the ring and little fingers and the thumb and now in the palm of her hand on an intermittent basis. She is having difficulty getting comfortable to sleep.    EVAL: Patient reports that she was lifting her luggage out of the overhead bin when she felt pain in the Lt shoulder and has had some pain in an intermittent basis. She had an injection in Lt shoulder 12/22/22 with some improvement but shoulder is stiff and painful.  Hand dominance: Right  PERTINENT HISTORY: Rt shoulder pain 3 yrs with shoulder surgery for labrum tear and biceps tendon; adhesive capsulitis; reactive hypoglycemic disease; fatty liver  PAIN:  Are you having pain? Yes: NPRS scale: 5/10 biceps; neck 2/10  Pain location: R shoulder/arm  Pain description: sharp; stabbing pain in biceps radiating to elbow with big reaching to the side  Aggravating factors: reaching  overhead to the side and behind  Relieving factors: ice; TENs unit   PRECAUTIONS: None  WEIGHT BEARING RESTRICTIONS: No  FALLS:  Has patient fallen in last 6 months? No   PATIENT GOALS:get rid of the shoulder pain and increase ROM   NEXT MD VISIT: Dr Karen Chafe 01/11/23; (Dr Aundria Rud next week)   OBJECTIVE:   DIAGNOSTIC FINDINGS:  None available for shoulder; x-ray (-)   02/13/23 MRI Lt shoulder -  some joint space narrowing, subchondral sclerosis, subchondral cyst formation and osteophyte formation in the glenohumeral joint, indicative of osteoarthritis.  POSTURE: Patient presents with head forward posture with increased thoracic kyphosis; shoulders rounded and elevated; scapulae abducted and rotated along the thoracic spine; head of the humerus anterior in orientation.  CERVICAL ROM: 02/02/23 Flexion 43 tight pain Lt upper trap  Extension 48 tight ant/post neck R lateral flexion 36 tight L cervical  L lateral flexion 42 R rotation 71 L rotation 62 tight pain Lt cervical to upper trap    UPPER EXTREMITY ROM:   Active ROM Right eval Left eval Left  01/11/23 Left  02/02/23 Left(passive) 03/15/23  Shoulder flexion 160 112 130 107 pain  80  Shoulder extension 59 25 48 31 pain  NT  Shoulder abduction 168 112 133 92 pain  NT  Shoulder adduction       Shoulder internal rotation T7 Thumb to waist Thumb  T12 Hand lateral hip pain  NT  Shoulder external rotation 95 85 93 45 pain  NT  Elbow flexion     120  Elbow extension     0  Wrist flexion       Wrist extension       Wrist ulnar deviation       Wrist radial deviation       Wrist pronation       Wrist supination       (Blank rows = not tested) PASSIVE ROM - supine flexion 152; scaption 147; ER 90  UPPER EXTREMITY MMT: strength not assessed - moves Lt UE well against gravity; limited by pain  MMT Right  eval Left eval  Shoulder flexion    Shoulder extension    Shoulder abduction    Shoulder adduction    Shoulder internal  rotation    Shoulder external rotation    Middle trapezius    Lower trapezius    Elbow flexion    Elbow extension    Wrist flexion    Wrist extension    Wrist ulnar deviation    Wrist radial deviation    Wrist pronation    Wrist supination    Grip strength (lbs)    (Blank rows = not tested)  JOINT MOBILITY TESTING:  Significant joint tightness Lt shoulder in all planes  02/02/23: Significant joint tightness Lt shoulder in all planes 03/15/23: NT  UPPER LIMB TENSION TEST:  Positive L with patient unable to achieve 90 deg shoulder abduction for testing. UE in ~ 60 deg abduction with elbow extension produced radicular symptoms into Lt UE   PALPATION:  Muscular tightness Lt shoulder through the upper trap; pecs; leveator; teres 02/02/23: muscular tightness Lt shoulder girdle in upper trap; pecs; leveator; rhomboids; teres 02/09/23: muscular tightness Lt cervical and upper trap area through the shoulder girdle including pecs; leveator; rhomboids; teres 02/15/23: persistent tightness L cervical and upper trap area  03/15/23: muscular tightness L shoulder girdle   [facility] Adult PT Treatment:                                                DATE: 03/15/23 Therapeutic Exercise: Sitting  Chin tuck Instruction in positioning for L UE on pillows  Assisted elbow flexion and extension Standing Pendulum  Supine  Chin tuck/neutral spine Manual Therapy: Pt supine for STM working through L cervical musculature and posterior shoulder girdle including upper trap; leveator; biceps; deltoid; periscapular musculature   PROM/gentle stretch for L elbow extension and flexion; L shoulder flexion to patient tolerance and tissue limits   Modalities:  Self care:  Instructed patient and husband Loraine Leriche in care for L shoulder post surgically including gentle massage and ROM as well as use of ice.    [facility] Adult PT Treatment:                                                DATE: 03/07/23 Therapeutic Exercise: Sitting   Chin tuck  Supine  Chin tuck/neutral spine Manual Therapy: Pt supine for STM working through the cervical musculature and pecs; upper trap; leveator; biceps; deltoid  Focus on STM through the L cervical area with good release of muscular tightness noted   STM/MFR through thoracic paraspinals and into Lt shoulder girdle Joint mobilization Lt shoulder and PROM/stretching pt in supine  Modalities: Moist heat thoracic spine; Lt shoulder x 10 min  [facility] Adult PT Treatment:                                                DATE: 02/21/23 Therapeutic Exercise: Sitting  Chin tuck  Supine  Chin tuck/neutral spine Manual Therapy: Pt supine for STM working through the cervical musculature and pecs; upper trap; leveator; biceps; deltoid  Focus on STM through the  L cervical area with good release of muscular tightness noted   STM/MFR through thoracic paraspinals and into Lt shoulder girdle Skilled palpation to assess response to manual work and DN Trigger Point Dry-Needling  Treatment instructions: Expect mild to moderate muscle soreness. S/S of pneumothorax if dry needled over a lung field, and to seek immediate medical attention should they occur. Patient verbalized understanding of these instructions and education.  Patient Consent Given: Yes Education handout provided: Previously provided Muscles treated: L lateral cervical; upper trap  Electrical stimulation performed: No Parameters: N/A Treatment response/outcome: decreased palpable tightness and decreased radicular symptoms L UE  Joint mobilization Lt shoulder and PROM/stretching pt in supine  Modalities: Moist heat thoracic spine; Lt shoulder x 10 min    PATIENT EDUCATION: Education details: POC; HEP Person educated: Patient Education method: Explanation, Demonstration, Tactile cues, Verbal cues, and Handouts Education comprehension: verbalized understanding, returned demonstration, verbal cues required, tactile cues required, and  needs further education  HOME EXERCISE PROGRAM: (from treatment prior to surgery 03/14/23)  E-mailed to: [email or web address]   Access Code: [email or web address] URL: [email or web address] Date: 01/13/2023 Prepared by: Corlis Leak  Exercises - Seated Shoulder Flexion AAROM with Pulley Behind  - 2 x daily - 7 x weekly - 1 sets - 10 reps - 10 sec  hold - Seated Shoulder Scaption AAROM with Pulley at Side  - 2 x daily - 7 x weekly - 1 sets - 10 reps - 10sec  hold - Seated Cervical Retraction  - 2 x daily - 7 x weekly - 1-2 sets - 5-10 reps - 10 sec  hold - Seated Scapular Retraction  - 2 x daily - 7 x weekly - 1-2 sets - 10 reps - 10 sec  hold - Standing Scapular Retraction  - 3 x daily - 7 x weekly - 1 sets - 10 reps - 10 sec  hold - Shoulder External Rotation and Scapular Retraction  - 3 x daily - 7 x weekly - 1 sets - 10 reps - 3-5 sec   hold - Seated Shoulder W  - 2 x daily - 7 x weekly - 1 sets - 10 reps - 3 sec  hold - Shoulder External Rotation and Scapular Retraction with Resistance  - 2 x daily - 7 x weekly - 1 sets - 10 reps - 3-5 sec  hold - Shoulder W - External Rotation with Resistance  - 2 x daily - 7 x weekly - 1-2 sets - 10 reps - 3 sec  hold - Standing Bilateral Low Shoulder Row with Anchored Resistance  - 2 x daily - 7 x weekly - 1-3 sets - 10 reps - 2-3 sec  hold - Drawing Bow  - 1 x daily - 7 x weekly - 1 sets - 10 reps - 3 sec  hold - Prone Scapular Retraction  - 2 x daily - 7 x weekly - 1 sets - 5-10 reps - 3-5 sec  hold - Cat Cow  - 2 x daily - 7 x weekly - 1 sets - 5-10 reps - 3-5 sec  hold - Cat Cow to Child's Pose  - 2 x daily - 7 x weekly - 1 sets - 3 reps - 20-30 sec  hold  ASSESSMENT:  CLINICAL IMPRESSION: Re-eval: 03/15/23: patient returns post surgery 03/14/23 for L shoulder debridement; labral repair; biceps tendon release; removal of bone spurs. She presents with L UE in sling with bulky dressing L shoulder. Patient has some pain but this is  minimal due to  lasting effects of spinal block. She has poor posture and alignment; limited cervical and L UE mobility; limited functional activity tolerance with no lifting allowed L UE; pain on a constant basis. Patient will benefit from PT to address problems identified.    03/07/23: Patient reports continued pain which is variable in intensity. She was seen by MD and has had a cervical MRI but she does not have results and will not see MD until 03/17/23. Patient has continued radicular UE symptoms that seem cervical in nature. Shoulder ROM is decreased and patient is unable to tolerate PROM/stretching for L shoulder. We will continue with treatment as tolerated and patient schedules. She is continuing with meds and exercises as tolerated from HEP. She continues to have cervical involvement of L UE symptoms as well as shoulder pathology. She has positive ULTT and muscular tightness to palpation through the L cervical and upper trap/clavicular area. Responds favorably to intervention through the L cervical spine but decreased symptoms are temporary. Continue axial extension in supine and neural mobilization as pt tolerates. MRI shows arthritis in L shoulder but no RC pathology.   OBJECTIVE IMPAIRMENTS: Lt shoulder pain which started with lifting overhead her luggage when travelling. She has had gradually increasing pain and stiffness in the Lt shoulder. She received an injection ~ 1-2 weeks ago with some improvement. Patient presents with poor posture and alignment; limited A and PROM Lt shoulder; functional weakness due to pain; decreased functional activities and pain on a daily basis    GOALS: Goals reviewed with patient? Yes  SHORT TERM GOALS: Target date: 04/26/2023   Independent in initial HEP  Baseline: Goal status: INITIAL  2.  Increase AROM shoulder flexion and abduction to 100-110 degrees  Baseline:  Goal status: INITIAL   LONG TERM GOALS: Target date: 06/07/2023   Decrease pain in the Lt shoulder  by 75-100% allowing patient to return to normal functional activities  Baseline:  Goal status: INITIAL  2.  AROM Lt shoulder WFL's and pain free  Baseline:  Goal status: INITIAL  3.  Patient reports return to normal functional activities including lifting grandson with minimal to no increase in Lt shoulder pain  Baseline:  Goal status: INITIAL  4.  Independent in HEP  Baseline:  Goal status: INITIAL  PLAN:  PT FREQUENCY: 2x/week  PT DURATION: 12 weeks  PLANNED INTERVENTIONS: Therapeutic exercises, Therapeutic activity, Neuromuscular re-education, Patient/Family education, Self Care, Joint mobilization, Aquatic Therapy, Dry Needling, Electrical stimulation, Spinal mobilization, Cryotherapy, Moist heat, Taping, Vasopneumatic device, Ultrasound, Ionotophoresis 4mg /ml Dexamethasone, Manual therapy, and Re-evaluation  PLAN FOR NEXT SESSION: review and progress exercises; continue with postural correction and education; manual work, DN, modalities as indicated    [employer], PT 03/15/2023, 12:55 PM

## 2023-03-17 ENCOUNTER — Encounter: Payer: Self-pay | Admitting: Rehabilitative and Restorative Service Providers"

## 2023-03-17 ENCOUNTER — Ambulatory Visit: Payer: BC Managed Care – PPO | Admitting: Rehabilitative and Restorative Service Providers"

## 2023-03-17 DIAGNOSIS — M6281 Muscle weakness (generalized): Secondary | ICD-10-CM | POA: Diagnosis not present

## 2023-03-17 DIAGNOSIS — R29898 Other symptoms and signs involving the musculoskeletal system: Secondary | ICD-10-CM

## 2023-03-17 DIAGNOSIS — G8929 Other chronic pain: Secondary | ICD-10-CM

## 2023-03-17 DIAGNOSIS — M25512 Pain in left shoulder: Secondary | ICD-10-CM

## 2023-03-17 DIAGNOSIS — M25511 Pain in right shoulder: Secondary | ICD-10-CM | POA: Diagnosis not present

## 2023-03-17 NOTE — Therapy (Signed)
OUTPATIENT PHYSICAL THERAPY SHOULDER TREATMENT   Patient Name: Lori King MRN: [id number] DOB:07-Apr-1966, 57 y.o., female Today's Date: 03/17/2023  END OF SESSION:  PT End of Session - 03/17/23 1442     Visit Number 2    Number of Visits 24    Date for PT Re-Evaluation 06/07/23    Authorization Type [employer] [id number]    PT Start Time 1400    PT Stop Time 1449    PT Time Calculation (min) 49 min    Activity Tolerance Patient tolerated treatment well;Patient limited by pain             Past Medical History:  Diagnosis Date   Adhesive capsulitis of right shoulder    Allergic rhinitis, seasonal    Anemia    Anxiety    MDD (major depressive disorder)    Wears contact lenses    Past Surgical History:  Procedure Laterality Date   BREAST BIOPSY Left 2010   x2 per pt benign   CESAREAN SECTION  X2  last one 02-28-2001   W/  BILATERAL TUBAL LIGATION w/ last c/s   COLONOSCOPY  11/01/2017   HYSTEROSCOPY W/ ENDOMETRIAL ABLATION  2019   LUMBAR LAMINECTOMY/DECOMPRESSION MICRODISCECTOMY  2013   L4  --- S1   SHOULDER ARTHROSCOPY Right 08/28/2020   Procedure: ARTHROSCOPY SHOULDER lysis of adhesions and manipulation under anesthesia, extensive debridement;  Surgeon: Yolonda Kida, MD;  Location: [facility];  Service: Orthopedics;  Laterality: Right;   There are no problems to display for this patient.   PCP: Dr Jolyn Nap Alphonse Guild  REFERRING PROVIDER: Dr Arlyce Harman  REFERRING DIAG: pain of Lt shoulder  THERAPY DIAG:  No diagnosis found.  Rationale for Evaluation and Treatment: Rehabilitation  ONSET DATE: 10/24/22  SUBJECTIVE:                                                                                                                                                                                      SUBJECTIVE STATEMENT: Patient reports that she was abe to remove the dressing and take a shower. She has been surprised at how the pain is not  too bad. Pain is mostly in the biceps. She has less tingling in the L UE/hand   03/15/23 RE-EVAL: Patient underwent L shoulder surgery with extensive debridement; labral repair; biceps tendon release; removal of bone spurs 03/14/23.  She has minimal pain due to remaining effects of spinal block for surgery. Bulky dressing limits movement and the effect of ice. Remains in the sling.   Cervical MRI shows degenerative changes C5/6. MD will send order for cervical spine.   Patient reports continued pain  in the L UE symptoms. She has stiffness and tightness in the L shoulder and the shoulder is tighter. She does not have the results of the MRI. She is scheduled to see the orthopedist, Dr Aundria Rud, tomorrow 03/17/23. She does not know results of neck MRI. [name] reports that she is taking gabapentin 3 times a day;  the muscle relaxants every 6 hours; finished a steroid dose pack; percocet as needed. She started the antiinflammatory medication this morning. She has less of the sharp pain and can tell the medication is helping. She received a steroid injection with [place]. Pain in the L neck and shoulder area continues. She has tingling and numbness in the ring and little fingers and the thumb and now in the palm of her hand on an intermittent basis. She is having difficulty getting comfortable to sleep.    EVAL: Patient reports that she was lifting her luggage out of the overhead bin when she felt pain in the Lt shoulder and has had some pain in an intermittent basis. She had an injection in Lt shoulder 12/22/22 with some improvement but shoulder is stiff and painful.  Hand dominance: Right  PERTINENT HISTORY: Rt shoulder pain 3 yrs with shoulder surgery for labrum tear and biceps tendon; adhesive capsulitis; reactive hypoglycemic disease; fatty liver  PAIN:  Are you having pain? Yes: NPRS scale: 3/10 biceps; neck 2/10  Pain location: R shoulder/arm  Pain description: sharp; stabbing pain in biceps radiating to  elbow with big reaching to the side  Aggravating factors: reaching overhead to the side and behind  Relieving factors: ice; TENs unit   PRECAUTIONS: None  WEIGHT BEARING RESTRICTIONS: No  FALLS:  Has patient fallen in last 6 months? No   PATIENT GOALS:get rid of the shoulder pain and increase ROM   NEXT MD VISIT: Dr Karen Chafe 01/11/23; (Dr Aundria Rud next week)   OBJECTIVE:   DIAGNOSTIC FINDINGS:  None available for shoulder; x-ray (-)   02/13/23 MRI Lt shoulder -  some joint space narrowing, subchondral sclerosis, subchondral cyst formation and osteophyte formation in the glenohumeral joint, indicative of osteoarthritis.  POSTURE: Patient presents with head forward posture with increased thoracic kyphosis; shoulders rounded and elevated; scapulae abducted and rotated along the thoracic spine; head of the humerus anterior in orientation.  CERVICAL ROM: 02/02/23 Flexion 43 tight pain Lt upper trap  Extension 48 tight ant/post neck R lateral flexion 36 tight L cervical  L lateral flexion 42 R rotation 71 L rotation 62 tight pain Lt cervical to upper trap    UPPER EXTREMITY ROM:   Active ROM Right eval Left eval Left  01/11/23 Left  02/02/23 Left(passive) 03/15/23  Shoulder flexion 160 112 130 107 pain  80  Shoulder extension 59 25 48 31 pain  NT  Shoulder abduction 168 112 133 92 pain  NT  Shoulder adduction       Shoulder internal rotation T7 Thumb to waist Thumb  T12 Hand lateral hip pain  NT  Shoulder external rotation 95 85 93 45 pain  NT  Elbow flexion     120  Elbow extension     0  Wrist flexion       Wrist extension       Wrist ulnar deviation       Wrist radial deviation       Wrist pronation       Wrist supination       (Blank rows = not tested) PASSIVE ROM - supine flexion 152;  scaption 147; ER 90  UPPER EXTREMITY MMT: strength not assessed - moves Lt UE well against gravity; limited by pain  MMT Right eval Left eval  Shoulder flexion    Shoulder extension     Shoulder abduction    Shoulder adduction    Shoulder internal rotation    Shoulder external rotation    Middle trapezius    Lower trapezius    Elbow flexion    Elbow extension    Wrist flexion    Wrist extension    Wrist ulnar deviation    Wrist radial deviation    Wrist pronation    Wrist supination    Grip strength (lbs)    (Blank rows = not tested)  JOINT MOBILITY TESTING:  Significant joint tightness Lt shoulder in all planes  02/02/23: Significant joint tightness Lt shoulder in all planes 03/15/23: NT  UPPER LIMB TENSION TEST:  Positive L with patient unable to achieve 90 deg shoulder abduction for testing. UE in ~ 60 deg abduction with elbow extension produced radicular symptoms into Lt UE   PALPATION:  Muscular tightness Lt shoulder through the upper trap; pecs; leveator; teres 02/02/23: muscular tightness Lt shoulder girdle in upper trap; pecs; leveator; rhomboids; teres 02/09/23: muscular tightness Lt cervical and upper trap area through the shoulder girdle including pecs; leveator; rhomboids; teres 02/15/23: persistent tightness L cervical and upper trap area  03/15/23: muscular tightness L shoulder girdle   [facility] Adult PT Treatment:                                                DATE: 03/17/23 Therapeutic Exercise: Sitting  Chin tuck Elbow flexion and extension x 5  Standing Pendulum 30 CW/30 CCW Supine  Chin tuck/neutral spine Elbow flexion and extension x 5 Manual Therapy: Pt supine for STM working through L cervical musculature and posterior shoulder girdle including upper trap; leveator; biceps; deltoid; periscapular musculature   Joint mobs - circumduction GH joint Joint mob using pillow case patient in sitting  PROM/gentle stretch for L elbow extension and flexion; L shoulder flexion to patient tolerance and tissue limits   Modalities: Ice cervical spine: L shoulder girdle  Self care:     [facility] Adult PT Treatment:                                                 DATE: 03/15/23 Therapeutic Exercise: Sitting  Chin tuck Instruction in positioning for L UE on pillows  Assisted elbow flexion and extension Standing Pendulum  Supine  Chin tuck/neutral spine Manual Therapy: Pt supine for STM working through L cervical musculature and posterior shoulder girdle including upper trap; leveator; biceps; deltoid; periscapular musculature   PROM/gentle stretch for L elbow extension and flexion; L shoulder flexion to patient tolerance and tissue limits   Modalities:  Self care:  Instructed patient and husband Loraine Leriche in care for L shoulder post surgically including gentle massage and ROM as well as use of ice.    [facility] Adult PT Treatment:  DATE: 03/07/23 Therapeutic Exercise: Sitting  Chin tuck  Supine  Chin tuck/neutral spine Manual Therapy: Pt supine for STM working through the cervical musculature and pecs; upper trap; leveator; biceps; deltoid  Focus on STM through the L cervical area with good release of muscular tightness noted   STM/MFR through thoracic paraspinals and into Lt shoulder girdle Joint mobilization Lt shoulder and PROM/stretching pt in supine  Modalities: Moist heat thoracic spine; Lt shoulder x 10 min  [facility] Adult PT Treatment:                                                DATE: 02/21/23 Therapeutic Exercise: Sitting  Chin tuck  Supine  Chin tuck/neutral spine Manual Therapy: Pt supine for STM working through the cervical musculature and pecs; upper trap; leveator; biceps; deltoid  Focus on STM through the L cervical area with good release of muscular tightness noted   STM/MFR through thoracic paraspinals and into Lt shoulder girdle Skilled palpation to assess response to manual work and DN Trigger Point Dry-Needling  Treatment instructions: Expect mild to moderate muscle soreness. S/S of pneumothorax if dry needled over a lung field, and to seek immediate medical attention should  they occur. Patient verbalized understanding of these instructions and education.  Patient Consent Given: Yes Education handout provided: Previously provided Muscles treated: L lateral cervical; upper trap  Electrical stimulation performed: No Parameters: N/A Treatment response/outcome: decreased palpable tightness and decreased radicular symptoms L UE  Joint mobilization Lt shoulder and PROM/stretching pt in supine  Modalities: Moist heat thoracic spine; Lt shoulder x 10 min    PATIENT EDUCATION: Education details: POC; HEP Person educated: Patient Education method: Explanation, Demonstration, Tactile cues, Verbal cues, and Handouts Education comprehension: verbalized understanding, returned demonstration, verbal cues required, tactile cues required, and needs further education  HOME EXERCISE PROGRAM: (from treatment prior to surgery 03/14/23)  E-mailed to: [email or web address]   Access Code: [email or web address] URL: [email or web address] Date: 01/13/2023 Prepared by: Corlis Leak  Exercises - Seated Shoulder Flexion AAROM with Pulley Behind  - 2 x daily - 7 x weekly - 1 sets - 10 reps - 10 sec  hold - Seated Shoulder Scaption AAROM with Pulley at Side  - 2 x daily - 7 x weekly - 1 sets - 10 reps - 10sec  hold - Seated Cervical Retraction  - 2 x daily - 7 x weekly - 1-2 sets - 5-10 reps - 10 sec  hold - Seated Scapular Retraction  - 2 x daily - 7 x weekly - 1-2 sets - 10 reps - 10 sec  hold - Standing Scapular Retraction  - 3 x daily - 7 x weekly - 1 sets - 10 reps - 10 sec  hold - Shoulder External Rotation and Scapular Retraction  - 3 x daily - 7 x weekly - 1 sets - 10 reps - 3-5 sec   hold - Seated Shoulder W  - 2 x daily - 7 x weekly - 1 sets - 10 reps - 3 sec  hold - Shoulder External Rotation and Scapular Retraction with Resistance  - 2 x daily - 7 x weekly - 1 sets - 10 reps - 3-5 sec  hold - Shoulder W - External Rotation with Resistance  - 2 x daily - 7 x weekly - 1-2  sets - 10 reps - 3 sec  hold - Standing Bilateral Low Shoulder Row with Anchored Resistance  - 2 x daily - 7 x weekly - 1-3 sets - 10 reps - 2-3 sec  hold - Drawing Bow  - 1 x daily - 7 x weekly - 1 sets - 10 reps - 3 sec  hold - Prone Scapular Retraction  - 2 x daily - 7 x weekly - 1 sets - 5-10 reps - 3-5 sec  hold - Cat Cow  - 2 x daily - 7 x weekly - 1 sets - 5-10 reps - 3-5 sec  hold - Cat Cow to Child's Pose  - 2 x daily - 7 x weekly - 1 sets - 3 reps - 20-30 sec  hold  ASSESSMENT:  CLINICAL IMPRESSION: Patient returns without dressing, steri strips dry and intact. Continue with pendulum, AROM L elbow. PT assist for manual work and PROM L shoulder shoulder flexion to 143; ER in scaption to 67 degrees. Tolerated treatment well. Good progress with PROM L shoulder   Re-eval: 03/15/23: patient returns post surgery 03/14/23 for L shoulder debridement; labral repair; biceps tendon release; removal of bone spurs. She presents with L UE in sling with bulky dressing L shoulder. Patient has some pain but this is minimal due to lasting effects of spinal block. She has poor posture and alignment; limited cervical and L UE mobility; limited functional activity tolerance with no lifting allowed L UE; pain on a constant basis. Patient will benefit from PT to address problems identified.    03/07/23: Patient reports continued pain which is variable in intensity. She was seen by MD and has had a cervical MRI but she does not have results and will not see MD until 03/17/23. Patient has continued radicular UE symptoms that seem cervical in nature. Shoulder ROM is decreased and patient is unable to tolerate PROM/stretching for L shoulder. We will continue with treatment as tolerated and patient schedules. She is continuing with meds and exercises as tolerated from HEP. She continues to have cervical involvement of L UE symptoms as well as shoulder pathology. She has positive ULTT and muscular tightness to palpation  through the L cervical and upper trap/clavicular area. Responds favorably to intervention through the L cervical spine but decreased symptoms are temporary. Continue axial extension in supine and neural mobilization as pt tolerates. MRI shows arthritis in L shoulder but no RC pathology.   OBJECTIVE IMPAIRMENTS: Lt shoulder pain which started with lifting overhead her luggage when travelling. She has had gradually increasing pain and stiffness in the Lt shoulder. She received an injection ~ 1-2 weeks ago with some improvement. Patient presents with poor posture and alignment; limited A and PROM Lt shoulder; functional weakness due to pain; decreased functional activities and pain on a daily basis    GOALS: Goals reviewed with patient? Yes  SHORT TERM GOALS: Target date: 04/26/2023   Independent in initial HEP  Baseline: Goal status: INITIAL  2.  Increase AROM shoulder flexion and abduction to 100-110 degrees  Baseline:  Goal status: INITIAL   LONG TERM GOALS: Target date: 06/07/2023   Decrease pain in the Lt shoulder by 75-100% allowing patient to return to normal functional activities  Baseline:  Goal status: INITIAL  2.  AROM Lt shoulder WFL's and pain free  Baseline:  Goal status: INITIAL  3.  Patient reports return to normal functional activities including lifting grandson with minimal to no increase in Lt shoulder pain  Baseline:  Goal status: INITIAL  4.  Independent in HEP  Baseline:  Goal status: INITIAL  PLAN:  PT FREQUENCY: 2x/week  PT DURATION: 12 weeks  PLANNED INTERVENTIONS: Therapeutic exercises, Therapeutic activity, Neuromuscular re-education, Patient/Family education, Self Care, Joint mobilization, Aquatic Therapy, Dry Needling, Electrical stimulation, Spinal mobilization, Cryotherapy, Moist heat, Taping, Vasopneumatic device, Ultrasound, Ionotophoresis 4mg /ml Dexamethasone, Manual therapy, and Re-evaluation  PLAN FOR NEXT SESSION: review and progress  exercises; continue with postural correction and education; manual work, DN, modalities as indicated    [employer], PT 03/17/2023, 2:42 PM

## 2023-03-21 DIAGNOSIS — T1511XA Foreign body in conjunctival sac, right eye, initial encounter: Secondary | ICD-10-CM | POA: Diagnosis not present

## 2023-03-22 ENCOUNTER — Ambulatory Visit: Payer: BC Managed Care – PPO

## 2023-03-22 DIAGNOSIS — M25512 Pain in left shoulder: Secondary | ICD-10-CM | POA: Diagnosis not present

## 2023-03-22 DIAGNOSIS — R29898 Other symptoms and signs involving the musculoskeletal system: Secondary | ICD-10-CM

## 2023-03-22 DIAGNOSIS — M25511 Pain in right shoulder: Secondary | ICD-10-CM | POA: Diagnosis not present

## 2023-03-22 DIAGNOSIS — M6281 Muscle weakness (generalized): Secondary | ICD-10-CM

## 2023-03-22 DIAGNOSIS — G8929 Other chronic pain: Secondary | ICD-10-CM

## 2023-03-22 NOTE — Therapy (Signed)
OUTPATIENT PHYSICAL THERAPY SHOULDER TREATMENT   Patient Name: Lori King MRN: [id number] DOB:1966/08/04, 57 y.o., female Today's Date: 03/22/2023  END OF SESSION:  PT End of Session - 03/22/23 1020     Visit Number 3    Number of Visits 24    Date for PT Re-Evaluation 06/07/23    Authorization Type [employer] [id number]    PT Start Time 1020    PT Stop Time 1112    PT Time Calculation (min) 52 min    Activity Tolerance Patient tolerated treatment well    Behavior During Therapy WFL for tasks assessed/performed             Past Medical History:  Diagnosis Date   Adhesive capsulitis of right shoulder    Allergic rhinitis, seasonal    Anemia    Anxiety    MDD (major depressive disorder)    Wears contact lenses    Past Surgical History:  Procedure Laterality Date   BREAST BIOPSY Left 2010   x2 per pt benign   CESAREAN SECTION  X2  last one 02-28-2001   W/  BILATERAL TUBAL LIGATION w/ last c/s   COLONOSCOPY  11/01/2017   HYSTEROSCOPY W/ ENDOMETRIAL ABLATION  2019   LUMBAR LAMINECTOMY/DECOMPRESSION MICRODISCECTOMY  2013   L4  --- S1   SHOULDER ARTHROSCOPY Right 08/28/2020   Procedure: ARTHROSCOPY SHOULDER lysis of adhesions and manipulation under anesthesia, extensive debridement;  Surgeon: Yolonda Kida, MD;  Location: [facility];  Service: Orthopedics;  Laterality: Right;   There are no problems to display for this patient.   PCP: Dr Angelica Chessman  REFERRING PROVIDER: Dr Arlyce Harman  REFERRING DIAG: pain of Lt shoulder  THERAPY DIAG:  Acute pain of left shoulder  Muscle weakness (generalized)  Other symptoms and signs involving the musculoskeletal system  Chronic right shoulder pain  Rationale for Evaluation and Treatment: Rehabilitation  ONSET DATE: 10/24/22  SUBJECTIVE:                                                                                                                                                                                       SUBJECTIVE STATEMENT: Patient reports her shoulder is very sore today, states she has been moving her arm more. Patient states she has been getting assistance from husband shoulder flexion and scap MWM, states she had pain with scap mobs. Patient states her endurance has been very low.    03/15/23 RE-EVAL: Patient underwent L shoulder surgery with extensive debridement; labral repair; biceps tendon release; removal of bone spurs 03/14/23.  She has minimal pain due to remaining effects of spinal block for surgery. Bulky  dressing limits movement and the effect of ice. Remains in the sling.   Cervical MRI shows degenerative changes C5/6. MD will send order for cervical spine.   Patient reports continued pain in the L UE symptoms. She has stiffness and tightness in the L shoulder and the shoulder is tighter. She does not have the results of the MRI. She is scheduled to see the orthopedist, Dr Aundria Rud, tomorrow 03/17/23. She does not know results of neck MRI. [name] reports that she is taking gabapentin 3 times a day;  the muscle relaxants every 6 hours; finished a steroid dose pack; percocet as needed. She started the antiinflammatory medication this morning. She has less of the sharp pain and can tell the medication is helping. She received a steroid injection with [place]. Pain in the L neck and shoulder area continues. She has tingling and numbness in the ring and little fingers and the thumb and now in the palm of her hand on an intermittent basis. She is having difficulty getting comfortable to sleep.    EVAL: Patient reports that she was lifting her luggage out of the overhead bin when she felt pain in the Lt shoulder and has had some pain in an intermittent basis. She had an injection in Lt shoulder 12/22/22 with some improvement but shoulder is stiff and painful.  Hand dominance: Right  PERTINENT HISTORY: Rt shoulder pain 3 yrs with shoulder surgery for labrum tear and biceps  tendon; adhesive capsulitis; reactive hypoglycemic disease; fatty liver  PAIN:  Are you having pain? Yes: NPRS scale: 3/10 biceps; neck 2/10  Pain location: R shoulder/arm  Pain description: sharp; stabbing pain in biceps radiating to elbow with big reaching to the side  Aggravating factors: reaching overhead to the side and behind  Relieving factors: ice; TENs unit   PRECAUTIONS: None  WEIGHT BEARING RESTRICTIONS: No  FALLS:  Has patient fallen in last 6 months? No   PATIENT GOALS:get rid of the shoulder pain and increase ROM   NEXT MD VISIT: 03/29/23 (Dr Aundria Rud)   OBJECTIVE:   DIAGNOSTIC FINDINGS:  None available for shoulder; x-ray (-)   02/13/23 MRI Lt shoulder -  some joint space narrowing, subchondral sclerosis, subchondral cyst formation and osteophyte formation in the glenohumeral joint, indicative of osteoarthritis.  POSTURE: Patient presents with head forward posture with increased thoracic kyphosis; shoulders rounded and elevated; scapulae abducted and rotated along the thoracic spine; head of the humerus anterior in orientation.  CERVICAL ROM: 02/02/23 Flexion 43 tight pain Lt upper trap  Extension 48 tight ant/post neck R lateral flexion 36 tight L cervical  L lateral flexion 42 R rotation 71 L rotation 62 tight pain Lt cervical to upper trap    UPPER EXTREMITY ROM:   Active ROM Right eval Left eval Left  01/11/23 Left  02/02/23 Left(passive) 03/15/23  Shoulder flexion 160 112 130 107 pain  80  Shoulder extension 59 25 48 31 pain  NT  Shoulder abduction 168 112 133 92 pain  NT  Shoulder adduction       Shoulder internal rotation T7 Thumb to waist Thumb  T12 Hand lateral hip pain  NT  Shoulder external rotation 95 85 93 45 pain  NT  Elbow flexion     120  Elbow extension     0  Wrist flexion       Wrist extension       Wrist ulnar deviation       Wrist radial deviation  Wrist pronation       Wrist supination       (Blank rows = not  tested) PASSIVE ROM - supine flexion 152; scaption 147; ER 90  UPPER EXTREMITY MMT: strength not assessed - moves Lt UE well against gravity; limited by pain  MMT Right eval Left eval  Shoulder flexion    Shoulder extension    Shoulder abduction    Shoulder adduction    Shoulder internal rotation    Shoulder external rotation    Middle trapezius    Lower trapezius    Elbow flexion    Elbow extension    Wrist flexion    Wrist extension    Wrist ulnar deviation    Wrist radial deviation    Wrist pronation    Wrist supination    Grip strength (lbs)    (Blank rows = not tested)  JOINT MOBILITY TESTING:  Significant joint tightness Lt shoulder in all planes  02/02/23: Significant joint tightness Lt shoulder in all planes 03/15/23: NT  UPPER LIMB TENSION TEST:  Positive L with patient unable to achieve 90 deg shoulder abduction for testing. UE in ~ 60 deg abduction with elbow extension produced radicular symptoms into Lt UE   PALPATION:  Muscular tightness Lt shoulder through the upper trap; pecs; leveator; teres 02/02/23: muscular tightness Lt shoulder girdle in upper trap; pecs; leveator; rhomboids; teres 02/09/23: muscular tightness Lt cervical and upper trap area through the shoulder girdle including pecs; leveator; rhomboids; teres 02/15/23: persistent tightness L cervical and upper trap area  03/15/23: muscular tightness L shoulder girdle    [facility] Adult PT Treatment:                                                DATE: 03/22/2023 Therapeutic Exercise: Seated: Elbow ext/flex  Wrist circles Standing with noodle: Scap  squeezes Chin tucks 5x10" Shoulder pendulums Shoulder flexion stretch at counter (AAROM walk out/in) Manual Therapy: Seated scap MWM (pillow case) --> STM L UT/LS Supine shoulder PROM flexion --> STM pecs --> scap mobs Modalities: Vaso L shoulder, 34 degrees,no compression x10  Cervical moist heat x10    [facility] Adult PT Treatment:                                                 DATE: 03/17/23 Therapeutic Exercise: Sitting  Chin tuck Elbow flexion and extension x 5  Standing Pendulum 30 CW/30 CCW Supine  Chin tuck/neutral spine Elbow flexion and extension x 5 Manual Therapy: Pt supine for STM working through L cervical musculature and posterior shoulder girdle including upper trap; leveator; biceps; deltoid; periscapular musculature   Joint mobs - circumduction GH joint Joint mob using pillow case patient in sitting  PROM/gentle stretch for L elbow extension and flexion; L shoulder flexion to patient tolerance and tissue limits   Modalities: Ice cervical spine: L shoulder girdle       [facility] Adult PT Treatment:                                                DATE: 02/21/23  Therapeutic Exercise: Sitting  Chin tuck  Supine  Chin tuck/neutral spine Manual Therapy: Pt supine for STM working through the cervical musculature and pecs; upper trap; leveator; biceps; deltoid  Focus on STM through the L cervical area with good release of muscular tightness noted   STM/MFR through thoracic paraspinals and into Lt shoulder girdle Skilled palpation to assess response to manual work and DN Trigger Point Dry-Needling  Treatment instructions: Expect mild to moderate muscle soreness. S/S of pneumothorax if dry needled over a lung field, and to seek immediate medical attention should they occur. Patient verbalized understanding of these instructions and education.  Patient Consent Given: Yes Education handout provided: Previously provided Muscles treated: L lateral cervical; upper trap  Electrical stimulation performed: No Parameters: N/A Treatment response/outcome: decreased palpable tightness and decreased radicular symptoms L UE  Joint mobilization Lt shoulder and PROM/stretching pt in supine  Modalities: Moist heat thoracic spine; Lt shoulder x 10 min    PATIENT EDUCATION: Education details: POC; HEP Person educated: Patient Education  method: Explanation, Demonstration, Tactile cues, Verbal cues, and Handouts Education comprehension: verbalized understanding, returned demonstration, verbal cues required, tactile cues required, and needs further education  HOME EXERCISE PROGRAM: (from treatment prior to surgery 03/14/23)  E-mailed to: [email or web address]   Access Code: [email or web address] URL: [email or web address] Date: 01/13/2023 Prepared by: Corlis Leak  Exercises - Seated Shoulder Flexion AAROM with Pulley Behind  - 2 x daily - 7 x weekly - 1 sets - 10 reps - 10 sec  hold - Seated Shoulder Scaption AAROM with Pulley at Side  - 2 x daily - 7 x weekly - 1 sets - 10 reps - 10sec  hold - Seated Cervical Retraction  - 2 x daily - 7 x weekly - 1-2 sets - 5-10 reps - 10 sec  hold - Seated Scapular Retraction  - 2 x daily - 7 x weekly - 1-2 sets - 10 reps - 10 sec  hold - Standing Scapular Retraction  - 3 x daily - 7 x weekly - 1 sets - 10 reps - 10 sec  hold - Shoulder External Rotation and Scapular Retraction  - 3 x daily - 7 x weekly - 1 sets - 10 reps - 3-5 sec   hold - Seated Shoulder W  - 2 x daily - 7 x weekly - 1 sets - 10 reps - 3 sec  hold - Shoulder External Rotation and Scapular Retraction with Resistance  - 2 x daily - 7 x weekly - 1 sets - 10 reps - 3-5 sec  hold - Shoulder W - External Rotation with Resistance  - 2 x daily - 7 x weekly - 1-2 sets - 10 reps - 3 sec  hold - Standing Bilateral Low Shoulder Row with Anchored Resistance  - 2 x daily - 7 x weekly - 1-3 sets - 10 reps - 2-3 sec  hold - Drawing Bow  - 1 x daily - 7 x weekly - 1 sets - 10 reps - 3 sec  hold - Prone Scapular Retraction  - 2 x daily - 7 x weekly - 1 sets - 5-10 reps - 3-5 sec  hold - Cat Cow  - 2 x daily - 7 x weekly - 1 sets - 5-10 reps - 3-5 sec  hold - Cat Cow to Child's Pose  - 2 x daily - 7 x weekly - 1 sets - 3 reps - 20-30 sec  hold  ASSESSMENT:  CLINICAL IMPRESSION: Continued PROM shoulder  and scapula mobility exercises. Noted  muscle guarded during passive shoulder flexion. Patient instructed in proper mechanics with passive scapula mobility with movement to help patient with HEP.   Re-eval: 03/15/23: patient returns post surgery 03/14/23 for L shoulder debridement; labral repair; biceps tendon release; removal of bone spurs. She presents with L UE in sling with bulky dressing L shoulder. Patient has some pain but this is minimal due to lasting effects of spinal block. She has poor posture and alignment; limited cervical and L UE mobility; limited functional activity tolerance with no lifting allowed L UE; pain on a constant basis. Patient will benefit from PT to address problems identified.    03/07/23: Patient reports continued pain which is variable in intensity. She was seen by MD and has had a cervical MRI but she does not have results and will not see MD until 03/17/23. Patient has continued radicular UE symptoms that seem cervical in nature. Shoulder ROM is decreased and patient is unable to tolerate PROM/stretching for L shoulder. We will continue with treatment as tolerated and patient schedules. She is continuing with meds and exercises as tolerated from HEP. She continues to have cervical involvement of L UE symptoms as well as shoulder pathology. She has positive ULTT and muscular tightness to palpation through the L cervical and upper trap/clavicular area. Responds favorably to intervention through the L cervical spine but decreased symptoms are temporary. Continue axial extension in supine and neural mobilization as pt tolerates. MRI shows arthritis in L shoulder but no RC pathology.   OBJECTIVE IMPAIRMENTS: Lt shoulder pain which started with lifting overhead her luggage when travelling. She has had gradually increasing pain and stiffness in the Lt shoulder. She received an injection ~ 1-2 weeks ago with some improvement. Patient presents with poor posture and alignment; limited A and PROM Lt shoulder; functional  weakness due to pain; decreased functional activities and pain on a daily basis    GOALS: Goals reviewed with patient? Yes  SHORT TERM GOALS: Target date: 04/26/2023  Independent in initial HEP  Baseline: Goal status: INITIAL  2.  Increase AROM shoulder flexion and abduction to 100-110 degrees  Baseline:  Goal status: INITIAL   LONG TERM GOALS: Target date: 06/07/2023  Decrease pain in the Lt shoulder by 75-100% allowing patient to return to normal functional activities  Baseline:  Goal status: INITIAL  2.  AROM Lt shoulder WFL's and pain free  Baseline:  Goal status: INITIAL  3.  Patient reports return to normal functional activities including lifting grandson with minimal to no increase in Lt shoulder pain  Baseline:  Goal status: INITIAL  4.  Independent in HEP  Baseline:  Goal status: INITIAL  PLAN:  PT FREQUENCY: 2x/week  PT DURATION: 12 weeks  PLANNED INTERVENTIONS: Therapeutic exercises, Therapeutic activity, Neuromuscular re-education, Patient/Family education, Self Care, Joint mobilization, Aquatic Therapy, Dry Needling, Electrical stimulation, Spinal mobilization, Cryotherapy, Moist heat, Taping, Vasopneumatic device, Ultrasound, Ionotophoresis 4mg /ml Dexamethasone, Manual therapy, and Re-evaluation  PLAN FOR NEXT SESSION: review and progress exercises; continue with postural correction and education; manual work, DN, modalities as indicated    Sanjuana Mae, PTA 03/22/2023, 11:03 AM

## 2023-03-24 ENCOUNTER — Ambulatory Visit: Payer: BC Managed Care – PPO

## 2023-03-24 DIAGNOSIS — R29898 Other symptoms and signs involving the musculoskeletal system: Secondary | ICD-10-CM

## 2023-03-24 DIAGNOSIS — G8929 Other chronic pain: Secondary | ICD-10-CM

## 2023-03-24 DIAGNOSIS — M25512 Pain in left shoulder: Secondary | ICD-10-CM

## 2023-03-24 DIAGNOSIS — M25511 Pain in right shoulder: Secondary | ICD-10-CM | POA: Diagnosis not present

## 2023-03-24 DIAGNOSIS — M6281 Muscle weakness (generalized): Secondary | ICD-10-CM

## 2023-03-24 NOTE — Therapy (Signed)
OUTPATIENT PHYSICAL THERAPY SHOULDER TREATMENT   Patient Name: Lori King MRN: [id number] DOB:1966-04-06, 57 y.o., female Today's Date: 03/24/2023  END OF SESSION:  PT End of Session - 03/24/23 1101     Visit Number 4    Number of Visits 24    Date for PT Re-Evaluation 06/07/23    PT Start Time 1100    PT Stop Time 1157    PT Time Calculation (min) 57 min    Activity Tolerance Patient tolerated treatment well    Behavior During Therapy WFL for tasks assessed/performed             Past Medical History:  Diagnosis Date   Adhesive capsulitis of right shoulder    Allergic rhinitis, seasonal    Anemia    Anxiety    MDD (major depressive disorder)    Wears contact lenses    Past Surgical History:  Procedure Laterality Date   BREAST BIOPSY Left 2010   x2 per pt benign   CESAREAN SECTION  X2  last one 02-28-2001   W/  BILATERAL TUBAL LIGATION w/ last c/s   COLONOSCOPY  11/01/2017   HYSTEROSCOPY W/ ENDOMETRIAL ABLATION  2019   LUMBAR LAMINECTOMY/DECOMPRESSION MICRODISCECTOMY  2013   L4  --- S1   SHOULDER ARTHROSCOPY Right 08/28/2020   Procedure: ARTHROSCOPY SHOULDER lysis of adhesions and manipulation under anesthesia, extensive debridement;  Surgeon: Lori Kida, MD;  Location: [facility];  Service: Orthopedics;  Laterality: Right;   There are no problems to display for this patient.   PCP: Dr Lori King  REFERRING PROVIDER: Dr Lori King  REFERRING DIAG: pain of Lt shoulder  THERAPY DIAG:  Acute pain of left shoulder  Muscle weakness (generalized)  Other symptoms and signs involving the musculoskeletal system  Chronic right shoulder pain  Rationale for Evaluation and Treatment: Rehabilitation  ONSET DATE: 10/24/22  SUBJECTIVE:                                                                                                                                                                                       SUBJECTIVE STATEMENT: Patient reports she notices she is guarding less nad moving more easily with L arm. Patient reports 2/10 pain today, states she took pain medicine prior to therapy. Patient states she did not sleep well and is tired today.    03/15/23 RE-EVAL: Patient underwent L shoulder surgery with extensive debridement; labral repair; biceps tendon release; removal of bone spurs 03/14/23.  She has minimal pain due to remaining effects of spinal block for surgery. Bulky dressing limits movement and the effect of ice. Remains in the sling.  Cervical MRI shows degenerative changes C5/6. MD will send order for cervical spine.   Patient reports continued pain in the L UE symptoms. She has stiffness and tightness in the L shoulder and the shoulder is tighter. She does not have the results of the MRI. She is scheduled to see the orthopedist, Dr Lori King, tomorrow 03/17/23. She does not know results of neck MRI. [name] reports that she is taking gabapentin 3 times a day;  the muscle relaxants every 6 hours; finished a steroid dose pack; percocet as needed. She started the antiinflammatory medication this morning. She has less of the sharp pain and can tell the medication is helping. She received a steroid injection with [place]. Pain in the L neck and shoulder area continues. She has tingling and numbness in the ring and little fingers and the thumb and now in the palm of her hand on an intermittent basis. She is having difficulty getting comfortable to sleep.    EVAL: Patient reports that she was lifting her luggage out of the overhead bin when she felt pain in the Lt shoulder and has had some pain in an intermittent basis. She had an injection in Lt shoulder 12/22/22 with some improvement but shoulder is stiff and painful.  Hand dominance: Right  PERTINENT HISTORY: Rt shoulder pain 3 yrs with shoulder surgery for labrum tear and biceps tendon; adhesive capsulitis; reactive hypoglycemic disease; fatty  liver  PAIN:  Are you having pain? Yes: NPRS scale: 3/10 biceps; neck 2/10  Pain location: R shoulder/arm  Pain description: sharp; stabbing pain in biceps radiating to elbow with big reaching to the side  Aggravating factors: reaching overhead to the side and behind  Relieving factors: ice; TENs unit   PRECAUTIONS: None  WEIGHT BEARING RESTRICTIONS: No  FALLS:  Has patient fallen in last 6 months? No   PATIENT GOALS:get rid of the shoulder pain and increase ROM   NEXT MD VISIT: 03/29/23 (Dr Lori King)   OBJECTIVE:   DIAGNOSTIC FINDINGS:  None available for shoulder; x-ray (-)   02/13/23 MRI Lt shoulder -  some joint space narrowing, subchondral sclerosis, subchondral cyst formation and osteophyte formation in the glenohumeral joint, indicative of osteoarthritis.  POSTURE: Patient presents with head forward posture with increased thoracic kyphosis; shoulders rounded and elevated; scapulae abducted and rotated along the thoracic spine; head of the humerus anterior in orientation.  CERVICAL ROM: 02/02/23 Flexion 43 tight pain Lt upper trap  Extension 48 tight ant/post neck R lateral flexion 36 tight L cervical  L lateral flexion 42 R rotation 71 L rotation 62 tight pain Lt cervical to upper trap    UPPER EXTREMITY ROM:   Active ROM Right eval Left eval Left  01/11/23 Left  02/02/23 Left(passive) 03/15/23  Shoulder flexion 160 112 130 107 pain  80  Shoulder extension 59 25 48 31 pain  NT  Shoulder abduction 168 112 133 92 pain  NT  Shoulder adduction       Shoulder internal rotation T7 Thumb to waist Thumb  T12 Hand lateral hip pain  NT  Shoulder external rotation 95 85 93 45 pain  NT  Elbow flexion     120  Elbow extension     0  Wrist flexion       Wrist extension       Wrist ulnar deviation       Wrist radial deviation       Wrist pronation       Wrist supination       (  Blank rows = not tested) PASSIVE ROM - supine flexion 152; scaption 147; ER 90  UPPER EXTREMITY  MMT: strength not assessed - moves Lt UE well against gravity; limited by pain  MMT Right eval Left eval  Shoulder flexion    Shoulder extension    Shoulder abduction    Shoulder adduction    Shoulder internal rotation    Shoulder external rotation    Middle trapezius    Lower trapezius    Elbow flexion    Elbow extension    Wrist flexion    Wrist extension    Wrist ulnar deviation    Wrist radial deviation    Wrist pronation    Wrist supination    Grip strength (lbs)    (Blank rows = not tested)  JOINT MOBILITY TESTING:  Significant joint tightness Lt shoulder in all planes  02/02/23: Significant joint tightness Lt shoulder in all planes 03/15/23: NT  UPPER LIMB TENSION TEST:  Positive L with patient unable to achieve 90 deg shoulder abduction for testing. UE in ~ 60 deg abduction with elbow extension produced radicular symptoms into Lt UE   PALPATION:  Muscular tightness Lt shoulder through the upper trap; pecs; leveator; teres 02/02/23: muscular tightness Lt shoulder girdle in upper trap; pecs; leveator; rhomboids; teres 02/09/23: muscular tightness Lt cervical and upper trap area through the shoulder girdle including pecs; leveator; rhomboids; teres 02/15/23: persistent tightness L cervical and upper trap area  03/15/23: muscular tightness L shoulder girdle    [facility] Adult PT Treatment:                                                DATE: 03/24/2023 Therapeutic Exercise: Shoulder flexion stretch at counter (AAROM walk out/in) Standing with noodle: Chin tucks 5x10" Scap  squeezes Shoulder pendulums Manual Therapy: Seated scap MWM (pillow case) --> STM traps/LS, rhomboids Supine shoulder PROM flexion  Modalities: Ice packs L shoulder complex x15 Cervical moist heat x15    [facility] Adult PT Treatment:                                                DATE: 03/22/2023 Therapeutic Exercise: Seated: Elbow ext/flex  Wrist circles Standing with noodle: Scap  squeezes Chin  tucks 5x10" Shoulder pendulums Shoulder flexion stretch at counter (AAROM walk out/in) Manual Therapy: Seated scap MWM (pillow case) --> STM L UT/LS Supine shoulder PROM flexion --> STM pecs --> scap mobs Modalities: Vaso L shoulder, 34 degrees,no compression x10  Cervical moist heat x10    [facility] Adult PT Treatment:                                                DATE: 02/21/23 Therapeutic Exercise: Sitting  Chin tuck  Supine  Chin tuck/neutral spine Manual Therapy: Pt supine for STM working through the cervical musculature and pecs; upper trap; leveator; biceps; deltoid  Focus on STM through the L cervical area with good release of muscular tightness noted   STM/MFR through thoracic paraspinals and into Lt shoulder girdle Skilled palpation to assess response to manual work and  DN Trigger Point Dry-Needling  Treatment instructions: Expect mild to moderate muscle soreness. S/S of pneumothorax if dry needled over a lung field, and to seek immediate medical attention should they occur. Patient verbalized understanding of these instructions and education.  Patient Consent Given: Yes Education handout provided: Previously provided Muscles treated: L lateral cervical; upper trap  Electrical stimulation performed: No Parameters: N/A Treatment response/outcome: decreased palpable tightness and decreased radicular symptoms L UE  Joint mobilization Lt shoulder and PROM/stretching pt in supine  Modalities: Moist heat thoracic spine; Lt shoulder x 10 min    PATIENT EDUCATION: Education details: POC; HEP Person educated: Patient Education method: Explanation, Demonstration, Tactile cues, Verbal cues, and Handouts Education comprehension: verbalized understanding, returned demonstration, verbal cues required, tactile cues required, and needs further education  HOME EXERCISE PROGRAM: (from treatment prior to surgery 03/14/23)  E-mailed to: [email or web address]   Access Code: [email or web address] URL:  [email or web address] Date: 01/13/2023 Prepared by: Corlis Leak  Exercises - Seated Shoulder Flexion AAROM with Pulley Behind  - 2 x daily - 7 x weekly - 1 sets - 10 reps - 10 sec  hold - Seated Shoulder Scaption AAROM with Pulley at Side  - 2 x daily - 7 x weekly - 1 sets - 10 reps - 10sec  hold - Seated Cervical Retraction  - 2 x daily - 7 x weekly - 1-2 sets - 5-10 reps - 10 sec  hold - Seated Scapular Retraction  - 2 x daily - 7 x weekly - 1-2 sets - 10 reps - 10 sec  hold - Standing Scapular Retraction  - 3 x daily - 7 x weekly - 1 sets - 10 reps - 10 sec  hold - Shoulder External Rotation and Scapular Retraction  - 3 x daily - 7 x weekly - 1 sets - 10 reps - 3-5 sec   hold - Seated Shoulder W  - 2 x daily - 7 x weekly - 1 sets - 10 reps - 3 sec  hold - Shoulder External Rotation and Scapular Retraction with Resistance  - 2 x daily - 7 x weekly - 1 sets - 10 reps - 3-5 sec  hold - Shoulder W - External Rotation with Resistance  - 2 x daily - 7 x weekly - 1-2 sets - 10 reps - 3 sec  hold - Standing Bilateral Low Shoulder Row with Anchored Resistance  - 2 x daily - 7 x weekly - 1-3 sets - 10 reps - 2-3 sec  hold - Drawing Bow  - 1 x daily - 7 x weekly - 1 sets - 10 reps - 3 sec  hold - Prone Scapular Retraction  - 2 x daily - 7 x weekly - 1 sets - 5-10 reps - 3-5 sec  hold - Cat Cow  - 2 x daily - 7 x weekly - 1 sets - 5-10 reps - 3-5 sec  hold - Cat Cow to Child's Pose  - 2 x daily - 7 x weekly - 1 sets - 3 reps - 20-30 sec  hold  ASSESSMENT:  CLINICAL IMPRESSION: Noted improved scapula mobility and less muscle guarding during passive ROM exercises. Exercises continued within protocol guidelines.   Re-eval: 03/15/23: patient returns post surgery 03/14/23 for L shoulder debridement; labral repair; biceps tendon release; removal of bone spurs. She presents with L UE in sling with bulky dressing L shoulder. Patient has some pain but this is minimal due to lasting effects of  spinal block. She has  poor posture and alignment; limited cervical and L UE mobility; limited functional activity tolerance with no lifting allowed L UE; pain on a constant basis. Patient will benefit from PT to address problems identified.    03/07/23: Patient reports continued pain which is variable in intensity. She was seen by MD and has had a cervical MRI but she does not have results and will not see MD until 03/17/23. Patient has continued radicular UE symptoms that seem cervical in nature. Shoulder ROM is decreased and patient is unable to tolerate PROM/stretching for L shoulder. We will continue with treatment as tolerated and patient schedules. She is continuing with meds and exercises as tolerated from HEP. She continues to have cervical involvement of L UE symptoms as well as shoulder pathology. She has positive ULTT and muscular tightness to palpation through the L cervical and upper trap/clavicular area. Responds favorably to intervention through the L cervical spine but decreased symptoms are temporary. Continue axial extension in supine and neural mobilization as pt tolerates. MRI shows arthritis in L shoulder but no RC pathology.   OBJECTIVE IMPAIRMENTS: Lt shoulder pain which started with lifting overhead her luggage when travelling. She has had gradually increasing pain and stiffness in the Lt shoulder. She received an injection ~ 1-2 weeks ago with some improvement. Patient presents with poor posture and alignment; limited A and PROM Lt shoulder; functional weakness due to pain; decreased functional activities and pain on a daily basis    GOALS: Goals reviewed with patient? Yes  SHORT TERM GOALS: Target date: 04/26/2023  Independent in initial HEP  Baseline: Goal status: INITIAL  2.  Increase AROM shoulder flexion and abduction to 100-110 degrees  Baseline:  Goal status: INITIAL   LONG TERM GOALS: Target date: 06/07/2023  Decrease pain in the Lt shoulder by 75-100% allowing  patient to return to normal functional activities  Baseline:  Goal status: INITIAL  2.  AROM Lt shoulder WFL's and pain free  Baseline:  Goal status: INITIAL  3.  Patient reports return to normal functional activities including lifting grandson with minimal to no increase in Lt shoulder pain  Baseline:  Goal status: INITIAL  4.  Independent in HEP  Baseline:  Goal status: INITIAL  PLAN:  PT FREQUENCY: 2x/week  PT DURATION: 12 weeks  PLANNED INTERVENTIONS: Therapeutic exercises, Therapeutic activity, Neuromuscular re-education, Patient/Family education, Self Care, Joint mobilization, Aquatic Therapy, Dry Needling, Electrical stimulation, Spinal mobilization, Cryotherapy, Moist heat, Taping, Vasopneumatic device, Ultrasound, Ionotophoresis 4mg /ml Dexamethasone, Manual therapy, and Re-evaluation  PLAN FOR NEXT SESSION: review and progress exercises; continue with postural correction and education; manual work, DN, modalities as indicated    Sanjuana Mae, PTA 03/24/2023, 11:47 AM

## 2023-03-29 ENCOUNTER — Ambulatory Visit: Payer: BC Managed Care – PPO | Admitting: Rehabilitative and Restorative Service Providers"

## 2023-03-29 ENCOUNTER — Encounter: Payer: Self-pay | Admitting: Rehabilitative and Restorative Service Providers"

## 2023-03-29 DIAGNOSIS — R29898 Other symptoms and signs involving the musculoskeletal system: Secondary | ICD-10-CM

## 2023-03-29 DIAGNOSIS — M25512 Pain in left shoulder: Secondary | ICD-10-CM

## 2023-03-29 DIAGNOSIS — G8929 Other chronic pain: Secondary | ICD-10-CM | POA: Diagnosis not present

## 2023-03-29 DIAGNOSIS — M25511 Pain in right shoulder: Secondary | ICD-10-CM | POA: Diagnosis not present

## 2023-03-29 DIAGNOSIS — M6281 Muscle weakness (generalized): Secondary | ICD-10-CM

## 2023-03-29 NOTE — Therapy (Signed)
OUTPATIENT PHYSICAL THERAPY SHOULDER TREATMENT   Patient Name: Lori King MRN: [id number] DOB:[birth year], 57 y.o., female Today's Date: 03/29/2023  END OF SESSION:  PT End of Session - 03/29/23 0933     Visit Number 5    Number of Visits 24    Date for PT Re-Evaluation 06/07/23    Authorization Type [employer] [id number]    PT Start Time 0932    PT Stop Time 1030    PT Time Calculation (min) 58 min    Activity Tolerance Patient tolerated treatment well             Past Medical History:  Diagnosis Date   Adhesive capsulitis of right shoulder    Allergic rhinitis, seasonal    Anemia    Anxiety    MDD (major depressive disorder)    Wears contact lenses    Past Surgical History:  Procedure Laterality Date   BREAST BIOPSY Left 2010   x2 per pt benign   CESAREAN SECTION  X2  last one 02-28-2001   W/  BILATERAL TUBAL LIGATION w/ last c/s   COLONOSCOPY  11/01/2017   HYSTEROSCOPY W/ ENDOMETRIAL ABLATION  2019   LUMBAR LAMINECTOMY/DECOMPRESSION MICRODISCECTOMY  2013   L4  --- S1   SHOULDER ARTHROSCOPY Right 08/28/2020   Procedure: ARTHROSCOPY SHOULDER lysis of adhesions and manipulation under anesthesia, extensive debridement;  Surgeon: Yolonda Kida, MD;  Location: [facility];  Service: Orthopedics;  Laterality: Right;   There are no problems to display for this patient.   PCP: Dr Angelica Chessman  REFERRING PROVIDER: Dr Arlyce Harman  REFERRING DIAG: pain of Lt shoulder  THERAPY DIAG:  Acute pain of left shoulder  Muscle weakness (generalized)  Other symptoms and signs involving the musculoskeletal system  Chronic right shoulder pain  Rationale for Evaluation and Treatment: Rehabilitation  ONSET DATE: 10/24/22  SUBJECTIVE:                                                                                                                                                                                      SUBJECTIVE STATEMENT: Patient  reports she notices she is guarding less nad moving more easily with L arm. Patient reports 2/10 pain today, states she took pain medicine prior to therapy. Patient states she did not sleep well and is tired today.    03/15/23 RE-EVAL: Patient underwent L shoulder surgery with extensive debridement; labral repair; biceps tendon release; removal of bone spurs 03/14/23.  She has minimal pain due to remaining effects of spinal block for surgery. Bulky dressing limits movement and the effect of ice. Remains in the sling.   Cervical MRI  shows degenerative changes C5/6. MD will send order for cervical spine.   Patient reports continued pain in the L UE symptoms. She has stiffness and tightness in the L shoulder and the shoulder is tighter. She does not have the results of the MRI. She is scheduled to see the orthopedist, Dr Aundria Rud, tomorrow 03/17/23. She does not know results of neck MRI. [name] reports that she is taking gabapentin 3 times a day;  the muscle relaxants every 6 hours; finished a steroid dose pack; percocet as needed. She started the antiinflammatory medication this morning. She has less of the sharp pain and can tell the medication is helping. She received a steroid injection with [place]. Pain in the L neck and shoulder area continues. She has tingling and numbness in the ring and little fingers and the thumb and now in the palm of her hand on an intermittent basis. She is having difficulty getting comfortable to sleep.    EVAL: Patient reports that she was lifting her luggage out of the overhead bin when she felt pain in the Lt shoulder and has had some pain in an intermittent basis. She had an injection in Lt shoulder 12/22/22 with some improvement but shoulder is stiff and painful.  Hand dominance: Right  PERTINENT HISTORY: Rt shoulder pain 3 yrs with shoulder surgery for labrum tear and biceps tendon; adhesive capsulitis; reactive hypoglycemic disease; fatty liver  PAIN:  Are you having pain?  Yes: NPRS scale: 4/10 biceps; in the night 6/10  Pain location: R shoulder/arm/shoulder blade area  Pain description: sharp; stabbing pain in biceps radiating to elbow with big reaching to the side  Aggravating factors: reaching overhead to the side and behind  Relieving factors: ice; TENs unit   PRECAUTIONS: None  WEIGHT BEARING RESTRICTIONS: No  FALLS:  Has patient fallen in last 6 months? No   PATIENT GOALS:get rid of the shoulder pain and increase ROM   NEXT MD VISIT: 03/29/23 (Dr Aundria Rud)   OBJECTIVE:   DIAGNOSTIC FINDINGS:  None available for shoulder; x-ray (-)   02/13/23 MRI Lt shoulder -  some joint space narrowing, subchondral sclerosis, subchondral cyst formation and osteophyte formation in the glenohumeral joint, indicative of osteoarthritis.  POSTURE: Patient presents with head forward posture with increased thoracic kyphosis; shoulders rounded and elevated; scapulae abducted and rotated along the thoracic spine; head of the humerus anterior in orientation.  CERVICAL ROM: 02/02/23 Flexion 43 tight pain Lt upper trap  Extension 48 tight ant/post neck R lateral flexion 36 tight L cervical  L lateral flexion 42 R rotation 71 L rotation 62 tight pain Lt cervical to upper trap    UPPER EXTREMITY ROM:   Active ROM Right eval Left eval Left  01/11/23 Left  02/02/23 Left(passive) 03/15/23 Left passive 03/29/23   Shoulder flexion 160 112 130 107 pain  80 125  Shoulder extension 59 25 48 31 pain  NT NT  Shoulder abduction (scaption) 168 112 133 92 pain  NT 84  Shoulder adduction        Shoulder internal rotation T7 Thumb to waist Thumb  T12 Hand lateral hip pain  NT neutral  Shoulder external rotation (in scapular plane) 95 85 93 45 pain  NT 70  Elbow flexion     120 WNL  Elbow extension     0 WFL  Wrist flexion        Wrist extension        Wrist ulnar deviation  Wrist radial deviation        Wrist pronation        Wrist supination        (Blank rows = not  tested) PASSIVE ROM - supine flexion 152; scaption 147; ER 90  UPPER EXTREMITY MMT: strength not assessed - moves Lt UE well against gravity; limited by pain  MMT Right eval Left eval  Shoulder flexion    Shoulder extension    Shoulder abduction    Shoulder adduction    Shoulder internal rotation    Shoulder external rotation    Middle trapezius    Lower trapezius    Elbow flexion    Elbow extension    Wrist flexion    Wrist extension    Wrist ulnar deviation    Wrist radial deviation    Wrist pronation    Wrist supination    Grip strength (lbs)    (Blank rows = not tested)  JOINT MOBILITY TESTING:  Significant joint tightness Lt shoulder in all planes  02/02/23: Significant joint tightness Lt shoulder in all planes 03/15/23: NT  UPPER LIMB TENSION TEST:  Positive L with patient unable to achieve 90 deg shoulder abduction for testing. UE in ~ 60 deg abduction with elbow extension produced radicular symptoms into Lt UE   PALPATION:  Muscular tightness Lt shoulder through the upper trap; pecs; leveator; teres 02/02/23: muscular tightness Lt shoulder girdle in upper trap; pecs; leveator; rhomboids; teres 02/09/23: muscular tightness Lt cervical and upper trap area through the shoulder girdle including pecs; leveator; rhomboids; teres 02/15/23: persistent tightness L cervical and upper trap area  03/15/23: muscular tightness L shoulder girdle    [facility] Adult PT Treatment:                                                DATE: 03/29/2023 Therapeutic Exercise: Pulley  Trial of flexion, ab/add in scaption Standing with noodle: Chin tucks 5x10" Scap  squeezes Shoulder pendulums Shoulder flexion stretch at counter (AAROM walk out/in) Manual Therapy: STM bilat cervical musculature; L shoulder girdle Scapular mobilization Gentle GH mobs  Seated scap MWM (pillow case)  Supine shoulder PROM flexion; scaption; ER in scapular plane  Modalities: Ice packs L shoulder complex x15 Cervical  moist heat x15   [facility] Adult PT Treatment:                                                DATE: 03/24/2023 Therapeutic Exercise: Shoulder flexion stretch at counter (AAROM walk out/in) Standing with noodle: Chin tucks 5x10" Scap  squeezes Shoulder pendulums Manual Therapy: Seated scap MWM (pillow case) --> STM traps/LS, rhomboids Supine shoulder PROM flexion  Modalities: Ice packs L shoulder complex x15 Cervical moist heat x15    [facility] Adult PT Treatment:                                                DATE: 03/22/2023 Therapeutic Exercise: Seated: Elbow ext/flex  Wrist circles Standing with noodle: Scap  squeezes Chin tucks 5x10" Shoulder pendulums Shoulder flexion stretch at counter (AAROM walk out/in) Manual Therapy:  Seated scap MWM (pillow case) --> STM L UT/LS Supine shoulder PROM flexion --> STM pecs --> scap mobs Modalities: Vaso L shoulder, 34 degrees,no compression x10  Cervical moist heat x10    [facility] Adult PT Treatment:                                                DATE: 02/21/23 Therapeutic Exercise: Sitting  Chin tuck  Supine  Chin tuck/neutral spine Manual Therapy: Pt supine for STM working through the cervical musculature and pecs; upper trap; leveator; biceps; deltoid  Focus on STM through the L cervical area with good release of muscular tightness noted   STM/MFR through thoracic paraspinals and into Lt shoulder girdle Skilled palpation to assess response to manual work and DN Trigger Point Dry-Needling  Treatment instructions: Expect mild to moderate muscle soreness. S/S of pneumothorax if dry needled over a lung field, and to seek immediate medical attention should they occur. Patient verbalized understanding of these instructions and education.  Patient Consent Given: Yes Education handout provided: Previously provided Muscles treated: L lateral cervical; upper trap  Electrical stimulation performed: No Parameters: N/A Treatment response/outcome:  decreased palpable tightness and decreased radicular symptoms L UE  Joint mobilization Lt shoulder and PROM/stretching pt in supine  Modalities: Moist heat thoracic spine; Lt shoulder x 10 min    PATIENT EDUCATION: Education details: POC; HEP Person educated: Patient Education method: Explanation, Demonstration, Tactile cues, Verbal cues, and Handouts Education comprehension: verbalized understanding, returned demonstration, verbal cues required, tactile cues required, and needs further education  HOME EXERCISE PROGRAM: (from treatment prior to surgery 03/14/23)  E-mailed to: [email or web address]   Access Code: [email or web address] URL: [email or web address] Date: 01/13/2023 Prepared by: Corlis Leak  Exercises - Seated Shoulder Flexion AAROM with Pulley Behind  - 2 x daily - 7 x weekly - 1 sets - 10 reps - 10 sec  hold - Seated Shoulder Scaption AAROM with Pulley at Side  - 2 x daily - 7 x weekly - 1 sets - 10 reps - 10sec  hold - Seated Cervical Retraction  - 2 x daily - 7 x weekly - 1-2 sets - 5-10 reps - 10 sec  hold - Seated Scapular Retraction  - 2 x daily - 7 x weekly - 1-2 sets - 10 reps - 10 sec  hold - Standing Scapular Retraction  - 3 x daily - 7 x weekly - 1 sets - 10 reps - 10 sec  hold - Shoulder External Rotation and Scapular Retraction  - 3 x daily - 7 x weekly - 1 sets - 10 reps - 3-5 sec   hold - Seated Shoulder W  - 2 x daily - 7 x weekly - 1 sets - 10 reps - 3 sec  hold - Shoulder External Rotation and Scapular Retraction with Resistance  - 2 x daily - 7 x weekly - 1 sets - 10 reps - 3-5 sec  hold - Shoulder W - External Rotation with Resistance  - 2 x daily - 7 x weekly - 1-2 sets - 10 reps - 3 sec  hold - Standing Bilateral Low Shoulder Row with Anchored Resistance  - 2 x daily - 7 x weekly - 1-3 sets - 10 reps - 2-3 sec  hold - Drawing Bow  - 1 x daily - 7 x weekly - 1 sets -  10 reps - 3 sec  hold - Prone Scapular Retraction  - 2 x daily - 7 x weekly - 1 sets -  5-10 reps - 3-5 sec  hold - Cat Cow  - 2 x daily - 7 x weekly - 1 sets - 5-10 reps - 3-5 sec  hold - Cat Cow to Child's Pose  - 2 x daily - 7 x weekly - 1 sets - 3 reps - 20-30 sec  hold  ASSESSMENT:  CLINICAL IMPRESSION: Persistent pain in the L upper quarter. Patient is working on [employer]. Note continued muscular tightness through L shoulder girdle with tightness in [facility] joint and decreased mobility and stability in the L scapulothoracic range. Exercises continued within protocol guidelines.   Re-eval: 03/15/23: patient returns post surgery 03/14/23 for L shoulder debridement; labral repair; biceps tendon release; removal of bone spurs. She presents with L UE in sling with bulky dressing L shoulder. Patient has some pain but this is minimal due to lasting effects of spinal block. She has poor posture and alignment; limited cervical and L UE mobility; limited functional activity tolerance with no lifting allowed L UE; pain on a constant basis. Patient will benefit from PT to address problems identified.    03/07/23: Patient reports continued pain which is variable in intensity. She was seen by MD and has had a cervical MRI but she does not have results and will not see MD until 03/17/23. Patient has continued radicular UE symptoms that seem cervical in nature. Shoulder ROM is decreased and patient is unable to tolerate PROM/stretching for L shoulder. We will continue with treatment as tolerated and patient schedules. She is continuing with meds and exercises as tolerated from HEP. She continues to have cervical involvement of L UE symptoms as well as shoulder pathology. She has positive ULTT and muscular tightness to palpation through the L cervical and upper trap/clavicular area. Responds favorably to intervention through the L cervical spine but decreased symptoms are temporary. Continue axial extension in supine and neural mobilization as pt tolerates. MRI shows arthritis in L shoulder but no RC  pathology.   OBJECTIVE IMPAIRMENTS: Lt shoulder pain which started with lifting overhead her luggage when travelling. She has had gradually increasing pain and stiffness in the Lt shoulder. She received an injection ~ 1-2 weeks ago with some improvement. Patient presents with poor posture and alignment; limited A and PROM Lt shoulder; functional weakness due to pain; decreased functional activities and pain on a daily basis    GOALS: Goals reviewed with patient? Yes  SHORT TERM GOALS: Target date: 04/26/2023  Independent in initial HEP  Baseline: Goal status: INITIAL  2.  Increase AROM shoulder flexion and abduction to 100-110 degrees  Baseline:  Goal status: INITIAL   LONG TERM GOALS: Target date: 06/07/2023  Decrease pain in the Lt shoulder by 75-100% allowing patient to return to normal functional activities  Baseline:  Goal status: INITIAL  2.  AROM Lt shoulder WFL's and pain free  Baseline:  Goal status: INITIAL  3.  Patient reports return to normal functional activities including lifting grandson with minimal to no increase in Lt shoulder pain  Baseline:  Goal status: INITIAL  4.  Independent in HEP  Baseline:  Goal status: INITIAL  PLAN:  PT FREQUENCY: 2x/week  PT DURATION: 12 weeks  PLANNED INTERVENTIONS: Therapeutic exercises, Therapeutic activity, Neuromuscular re-education, Patient/Family education, Self Care, Joint mobilization, Aquatic Therapy, Dry Needling, Electrical stimulation, Spinal mobilization, Cryotherapy, Moist heat, Taping, Vasopneumatic device, Ultrasound,  Ionotophoresis 4mg /ml Dexamethasone, Manual therapy, and Re-evaluation  PLAN FOR NEXT SESSION: review and progress exercises; continue with postural correction and education; manual work, DN, modalities as indicated    [employer], PT 03/29/2023, 10:32 AM

## 2023-03-31 ENCOUNTER — Encounter: Payer: Self-pay | Admitting: Rehabilitative and Restorative Service Providers"

## 2023-03-31 ENCOUNTER — Ambulatory Visit
Payer: BC Managed Care – PPO | Attending: Orthopedic Surgery | Admitting: Rehabilitative and Restorative Service Providers"

## 2023-03-31 DIAGNOSIS — M25512 Pain in left shoulder: Secondary | ICD-10-CM | POA: Insufficient documentation

## 2023-03-31 DIAGNOSIS — R29898 Other symptoms and signs involving the musculoskeletal system: Secondary | ICD-10-CM | POA: Diagnosis not present

## 2023-03-31 DIAGNOSIS — G8929 Other chronic pain: Secondary | ICD-10-CM | POA: Diagnosis not present

## 2023-03-31 DIAGNOSIS — M6281 Muscle weakness (generalized): Secondary | ICD-10-CM | POA: Diagnosis not present

## 2023-03-31 DIAGNOSIS — M25511 Pain in right shoulder: Secondary | ICD-10-CM | POA: Insufficient documentation

## 2023-03-31 NOTE — Therapy (Signed)
OUTPATIENT PHYSICAL THERAPY SHOULDER TREATMENT   Patient Name: Lori King MRN: [id number] DOB:[birth year], 57 y.o., female Today's Date: 03/31/2023  END OF SESSION:  PT End of Session - 03/31/23 1107     Visit Number 6    Number of Visits 24    Date for PT Re-Evaluation 06/07/23    Authorization Type [employer] [id number]    PT Start Time 1100    PT Stop Time 1150    PT Time Calculation (min) 50 min    Activity Tolerance Patient tolerated treatment well             Past Medical History:  Diagnosis Date   Adhesive capsulitis of right shoulder    Allergic rhinitis, seasonal    Anemia    Anxiety    MDD (major depressive disorder)    Wears contact lenses    Past Surgical History:  Procedure Laterality Date   BREAST BIOPSY Left 2010   x2 per pt benign   CESAREAN SECTION  X2  last one 02-28-2001   W/  BILATERAL TUBAL LIGATION w/ last c/s   COLONOSCOPY  11/01/2017   HYSTEROSCOPY W/ ENDOMETRIAL ABLATION  2019   LUMBAR LAMINECTOMY/DECOMPRESSION MICRODISCECTOMY  2013   L4  --- S1   SHOULDER ARTHROSCOPY Right 08/28/2020   Procedure: ARTHROSCOPY SHOULDER lysis of adhesions and manipulation under anesthesia, extensive debridement;  Surgeon: Yolonda Kida, MD;  Location: [facility];  Service: Orthopedics;  Laterality: Right;   There are no problems to display for this patient.   PCP: Dr Angelica Chessman  REFERRING PROVIDER: Dr Arlyce Harman  REFERRING DIAG: pain of Lt shoulder  THERAPY DIAG:  Acute pain of left shoulder  Muscle weakness (generalized)  Other symptoms and signs involving the musculoskeletal system  Chronic right shoulder pain  Rationale for Evaluation and Treatment: Rehabilitation  ONSET DATE: 10/24/22  SUBJECTIVE:                                                                                                                                                                                      SUBJECTIVE STATEMENT: Patient  reports that she saw surgeon Tuesday and he is not pleased with her ROM. Wants her to "move more". OK to do DN now. She is trying more moving at home. Patient states she is still not sleeping well and is tired all the time.    03/15/23 RE-EVAL: Patient underwent L shoulder surgery with extensive debridement; labral repair; biceps tendon release; removal of bone spurs 03/14/23.  She has minimal pain due to remaining effects of spinal block for surgery. Bulky dressing limits movement and the effect of ice. Remains  in the sling.   Cervical MRI shows degenerative changes C5/6. MD will send order for cervical spine.   Patient reports continued pain in the L UE symptoms. She has stiffness and tightness in the L shoulder and the shoulder is tighter. She does not have the results of the MRI. She is scheduled to see the orthopedist, Dr Aundria Rud, tomorrow 03/17/23. She does not know results of neck MRI. [name] reports that she is taking gabapentin 3 times a day;  the muscle relaxants every 6 hours; finished a steroid dose pack; percocet as needed. She started the antiinflammatory medication this morning. She has less of the sharp pain and can tell the medication is helping. She received a steroid injection with [place]. Pain in the L neck and shoulder area continues. She has tingling and numbness in the ring and little fingers and the thumb and now in the palm of her hand on an intermittent basis. She is having difficulty getting comfortable to sleep.    EVAL: Patient reports that she was lifting her luggage out of the overhead bin when she felt pain in the Lt shoulder and has had some pain in an intermittent basis. She had an injection in Lt shoulder 12/22/22 with some improvement but shoulder is stiff and painful.  Hand dominance: Right  PERTINENT HISTORY: Rt shoulder pain 3 yrs with shoulder surgery for labrum tear and biceps tendon; adhesive capsulitis; reactive hypoglycemic disease; fatty liver  PAIN:  Are you  having pain? Yes: NPRS scale: 6/10 biceps; in the night 8/10  Pain location: R shoulder/arm/shoulder blade area  Pain description: sharp; stabbing pain in biceps radiating to elbow with big reaching to the side  Aggravating factors: reaching overhead to the side and behind  Relieving factors: ice; TENs unit   PRECAUTIONS: None  WEIGHT BEARING RESTRICTIONS: No  FALLS:  Has patient fallen in last 6 months? No   PATIENT GOALS:get rid of the shoulder pain and increase ROM   NEXT MD VISIT: 03/29/23 (Dr Aundria Rud)   OBJECTIVE:   DIAGNOSTIC FINDINGS:  None available for shoulder; x-ray (-)   02/13/23 MRI Lt shoulder -  some joint space narrowing, subchondral sclerosis, subchondral cyst formation and osteophyte formation in the glenohumeral joint, indicative of osteoarthritis.  POSTURE: Patient presents with head forward posture with increased thoracic kyphosis; shoulders rounded and elevated; scapulae abducted and rotated along the thoracic spine; head of the humerus anterior in orientation.  CERVICAL ROM: 02/02/23 Flexion 43 tight pain Lt upper trap  Extension 48 tight ant/post neck R lateral flexion 36 tight L cervical  L lateral flexion 42 R rotation 71 L rotation 62 tight pain Lt cervical to upper trap    UPPER EXTREMITY ROM:   Active ROM Right eval Left eval Left  01/11/23 Left  02/02/23 Left(passive) 03/15/23 Left passive 03/29/23   Shoulder flexion 160 112 130 107 pain  80 125  Shoulder extension 59 25 48 31 pain  NT NT  Shoulder abduction (scaption) 168 112 133 92 pain  NT 84  Shoulder adduction        Shoulder internal rotation T7 Thumb to waist Thumb  T12 Hand lateral hip pain  NT neutral  Shoulder external rotation (in scapular plane) 95 85 93 45 pain  NT 70  Elbow flexion     120 WNL  Elbow extension     0 WFL  Wrist flexion        Wrist extension        Wrist ulnar deviation  Wrist radial deviation        Wrist pronation        Wrist supination         (Blank rows = not tested) PASSIVE ROM - supine flexion 152; scaption 147; ER 90  UPPER EXTREMITY MMT: strength not assessed - moves Lt UE well against gravity; limited by pain  MMT Right eval Left eval  Shoulder flexion    Shoulder extension    Shoulder abduction    Shoulder adduction    Shoulder internal rotation    Shoulder external rotation    Middle trapezius    Lower trapezius    Elbow flexion    Elbow extension    Wrist flexion    Wrist extension    Wrist ulnar deviation    Wrist radial deviation    Wrist pronation    Wrist supination    Grip strength (lbs)    (Blank rows = not tested)  JOINT MOBILITY TESTING:  Significant joint tightness Lt shoulder in all planes  02/02/23: Significant joint tightness Lt shoulder in all planes 03/15/23: NT  UPPER LIMB TENSION TEST:  Positive L with patient unable to achieve 90 deg shoulder abduction for testing. UE in ~ 60 deg abduction with elbow extension produced radicular symptoms into Lt UE   PALPATION:  Muscular tightness Lt shoulder through the upper trap; pecs; leveator; teres 02/02/23: muscular tightness Lt shoulder girdle in upper trap; pecs; leveator; rhomboids; teres 02/09/23: muscular tightness Lt cervical and upper trap area through the shoulder girdle including pecs; leveator; rhomboids; teres 02/15/23: persistent tightness L cervical and upper trap area  03/15/23: muscular tightness L shoulder girdle    [facility] Adult PT Treatment:                                                DATE: 03/31/2023 Therapeutic Exercise: Pulley  Trial of flexion, ab/add in scaption Standing with noodle: Chin tucks 5x10" Scap  squeezes Shoulder pendulums Shoulder flexion stretch at counter (AAROM walk out/in) Manual Therapy: Skilled palpation to assess response to DN and manual work Psychiatrist  Treatment instructions: Expect mild to moderate muscle soreness. S/S of pneumothorax if dry needled over a lung field, and to seek  immediate medical attention should they occur. Patient verbalized understanding of these instructions and education.  Patient Consent Given: Yes Education handout provided: Previously provided Muscles treated: L lateral cervical; upper trap  Electrical stimulation performed: No Parameters: N/A Treatment response/outcome: decreased palpable tightness and decreased radicular symptoms L UE STM bilat, L > R,  cervical musculature; L shoulder girdle Scapular mobilization GH mobs  Seated GH mobilization with pillow case Supine shoulder PROM flexion; scaption; ER in scapular plane  Modalities: Ice packs L shoulder complex x15 Cervical moist heat x15   [facility] Adult PT Treatment:                                                DATE: 03/29/2023 Therapeutic Exercise: Pulley  Trial of flexion, ab/add in scaption Standing with noodle: Chin tucks 5x10" Scap  squeezes Shoulder pendulums Shoulder flexion stretch at counter (AAROM walk out/in) Manual Therapy: STM bilat cervical musculature; L shoulder girdle Scapular mobilization Gentle GH mobs  Seated scap  MWM (pillow case)  Supine shoulder PROM flexion; scaption; ER in scapular plane  Modalities: Ice packs L shoulder complex x15 Cervical moist heat x15   [facility] Adult PT Treatment:                                                DATE: 03/24/2023 Therapeutic Exercise: Shoulder flexion stretch at counter (AAROM walk out/in) Standing with noodle: Chin tucks 5x10" Scap  squeezes Shoulder pendulums Manual Therapy: Seated scap MWM (pillow case) --> STM traps/LS, rhomboids Supine shoulder PROM flexion  Modalities: Ice packs L shoulder complex x15 Cervical moist heat x15    [facility] Adult PT Treatment:                                                DATE: 03/22/2023 Therapeutic Exercise: Seated: Elbow ext/flex  Wrist circles Standing with noodle: Scap  squeezes Chin tucks 5x10" Shoulder pendulums Shoulder flexion stretch at counter (AAROM  walk out/in) Manual Therapy: Seated scap MWM (pillow case) --> STM L UT/LS Supine shoulder PROM flexion --> STM pecs --> scap mobs Modalities: Vaso L shoulder, 34 degrees,no compression x10  Cervical moist heat x10   PATIENT EDUCATION: Education details: POC; HEP Person educated: Patient Education method: Programmer, multimedia, Demonstration, Tactile cues, Verbal cues, and Handouts Education comprehension: verbalized understanding, returned demonstration, verbal cues required, tactile cues required, and needs further education  HOME EXERCISE PROGRAM: (from treatment prior to surgery 03/14/23)  E-mailed to: [email or web address]   Access Code: [email or web address] URL: [email or web address] Date: 01/13/2023 Prepared by: Corlis Leak  Exercises - Seated Shoulder Flexion AAROM with Pulley Behind  - 2 x daily - 7 x weekly - 1 sets - 10 reps - 10 sec  hold - Seated Shoulder Scaption AAROM with Pulley at Side  - 2 x daily - 7 x weekly - 1 sets - 10 reps - 10sec  hold - Seated Cervical Retraction  - 2 x daily - 7 x weekly - 1-2 sets - 5-10 reps - 10 sec  hold - Seated Scapular Retraction  - 2 x daily - 7 x weekly - 1-2 sets - 10 reps - 10 sec  hold - Standing Scapular Retraction  - 3 x daily - 7 x weekly - 1 sets - 10 reps - 10 sec  hold - Shoulder External Rotation and Scapular Retraction  - 3 x daily - 7 x weekly - 1 sets - 10 reps - 3-5 sec   hold - Seated Shoulder W  - 2 x daily - 7 x weekly - 1 sets - 10 reps - 3 sec  hold - Shoulder External Rotation and Scapular Retraction with Resistance  - 2 x daily - 7 x weekly - 1 sets - 10 reps - 3-5 sec  hold - Shoulder W - External Rotation with Resistance  - 2 x daily - 7 x weekly - 1-2 sets - 10 reps - 3 sec  hold - Standing Bilateral Low Shoulder Row with Anchored Resistance  - 2 x daily - 7 x weekly - 1-3 sets - 10 reps - 2-3 sec  hold - Drawing Bow  - 1 x daily - 7 x weekly - 1 sets - 10 reps - 3 sec  hold - Prone Scapular Retraction  - 2 x daily - 7  x weekly - 1 sets - 5-10 reps - 3-5 sec  hold - Cat Cow  - 2 x daily - 7 x weekly - 1 sets - 5-10 reps - 3-5 sec  hold - Cat Cow to Child's Pose  - 2 x daily - 7 x weekly - 1 sets - 3 reps - 20-30 sec  hold  ASSESSMENT:  CLINICAL IMPRESSION: Persistent pain in the L upper quarter. MD wants patient to increased ROM and he is OK to begin DN. Patient is working on [employer]. Note continued muscular tightness through L shoulder girdle with tightness in [facility] joint and decreased mobility and stability in the L scapulothoracic range. Exercises continued within protocol guidelines.   Re-eval: 03/15/23: patient returns post surgery 03/14/23 for L shoulder debridement; labral repair; biceps tendon release; removal of bone spurs. She presents with L UE in sling with bulky dressing L shoulder. Patient has some pain but this is minimal due to lasting effects of spinal block. She has poor posture and alignment; limited cervical and L UE mobility; limited functional activity tolerance with no lifting allowed L UE; pain on a constant basis. Patient will benefit from PT to address problems identified.    03/07/23: Patient reports continued pain which is variable in intensity. She was seen by MD and has had a cervical MRI but she does not have results and will not see MD until 03/17/23. Patient has continued radicular UE symptoms that seem cervical in nature. Shoulder ROM is decreased and patient is unable to tolerate PROM/stretching for L shoulder. We will continue with treatment as tolerated and patient schedules. She is continuing with meds and exercises as tolerated from HEP. She continues to have cervical involvement of L UE symptoms as well as shoulder pathology. She has positive ULTT and muscular tightness to palpation through the L cervical and upper trap/clavicular area. Responds favorably to intervention through the L cervical spine but decreased symptoms are temporary. Continue axial extension in supine and neural  mobilization as pt tolerates. MRI shows arthritis in L shoulder but no RC pathology.   OBJECTIVE IMPAIRMENTS: Lt shoulder pain which started with lifting overhead her luggage when travelling. She has had gradually increasing pain and stiffness in the Lt shoulder. She received an injection ~ 1-2 weeks ago with some improvement. Patient presents with poor posture and alignment; limited A and PROM Lt shoulder; functional weakness due to pain; decreased functional activities and pain on a daily basis    GOALS: Goals reviewed with patient? Yes  SHORT TERM GOALS: Target date: 04/26/2023  Independent in initial HEP  Baseline: Goal status: INITIAL  2.  Increase AROM shoulder flexion and abduction to 100-110 degrees  Baseline:  Goal status: INITIAL   LONG TERM GOALS: Target date: 06/07/2023  Decrease pain in the Lt shoulder by 75-100% allowing patient to return to normal functional activities  Baseline:  Goal status: INITIAL  2.  AROM Lt shoulder WFL's and pain free  Baseline:  Goal status: INITIAL  3.  Patient reports return to normal functional activities including lifting grandson with minimal to no increase in Lt shoulder pain  Baseline:  Goal status: INITIAL  4.  Independent in HEP  Baseline:  Goal status: INITIAL  PLAN:  PT FREQUENCY: 2x/week  PT DURATION: 12 weeks  PLANNED INTERVENTIONS: Therapeutic exercises, Therapeutic activity, Neuromuscular re-education, Patient/Family education, Self Care, Joint mobilization, Aquatic Therapy, Dry Needling, Electrical stimulation, Spinal mobilization,  Cryotherapy, Moist heat, Taping, Vasopneumatic device, Ultrasound, Ionotophoresis 4mg /ml Dexamethasone, Manual therapy, and Re-evaluation  PLAN FOR NEXT SESSION: review and progress exercises; continue with postural correction and education; manual work, DN, modalities as indicated    [employer], PT 03/31/2023, 11:07 AM

## 2023-04-04 ENCOUNTER — Encounter: Payer: Self-pay | Admitting: Rehabilitative and Restorative Service Providers"

## 2023-04-04 ENCOUNTER — Ambulatory Visit: Payer: BC Managed Care – PPO | Admitting: Rehabilitative and Restorative Service Providers"

## 2023-04-04 DIAGNOSIS — R29898 Other symptoms and signs involving the musculoskeletal system: Secondary | ICD-10-CM | POA: Diagnosis not present

## 2023-04-04 DIAGNOSIS — M6281 Muscle weakness (generalized): Secondary | ICD-10-CM | POA: Diagnosis not present

## 2023-04-04 DIAGNOSIS — M25511 Pain in right shoulder: Secondary | ICD-10-CM | POA: Diagnosis not present

## 2023-04-04 DIAGNOSIS — G8929 Other chronic pain: Secondary | ICD-10-CM

## 2023-04-04 DIAGNOSIS — M25512 Pain in left shoulder: Secondary | ICD-10-CM | POA: Diagnosis not present

## 2023-04-04 NOTE — Therapy (Signed)
OUTPATIENT PHYSICAL THERAPY SHOULDER TREATMENT   Patient Name: Lori King MRN: [id number] DOB:1966/01/12, 57 y.o., female Today's Date: 04/04/2023  END OF SESSION:  PT End of Session - 04/04/23 1016     Visit Number 7    Number of Visits 24    Date for PT Re-Evaluation 06/07/23    Authorization Type [employer] [id number]    PT Start Time 1014    PT Stop Time 1105    PT Time Calculation (min) 51 min    Activity Tolerance Patient tolerated treatment well             Past Medical History:  Diagnosis Date   Adhesive capsulitis of right shoulder    Allergic rhinitis, seasonal    Anemia    Anxiety    MDD (major depressive disorder)    Wears contact lenses    Past Surgical History:  Procedure Laterality Date   BREAST BIOPSY Left 2010   x2 per pt benign   CESAREAN SECTION  X2  last one 02-28-2001   W/  BILATERAL TUBAL LIGATION w/ last c/s   COLONOSCOPY  11/01/2017   HYSTEROSCOPY W/ ENDOMETRIAL ABLATION  2019   LUMBAR LAMINECTOMY/DECOMPRESSION MICRODISCECTOMY  2013   L4  --- S1   SHOULDER ARTHROSCOPY Right 08/28/2020   Procedure: ARTHROSCOPY SHOULDER lysis of adhesions and manipulation under anesthesia, extensive debridement;  Surgeon: Yolonda Kida, MD;  Location: [facility];  Service: Orthopedics;  Laterality: Right;   There are no problems to display for this patient.   PCP: Dr Angelica Chessman  REFERRING PROVIDER: Dr Arlyce Harman  REFERRING DIAG: pain of Lt shoulder  THERAPY DIAG:  Acute pain of left shoulder  Muscle weakness (generalized)  Other symptoms and signs involving the musculoskeletal system  Chronic right shoulder pain  Rationale for Evaluation and Treatment: Rehabilitation  ONSET DATE: 10/24/22  SUBJECTIVE:                                                                                                                                                                                      SUBJECTIVE STATEMENT: Patient  reports that she feels her shoulder is moving better. [place] and exercises in the water. Patient is continuing to work on exercises at home. She is sleeping better with new medication.     03/15/23 RE-EVAL: Patient underwent L shoulder surgery with extensive debridement; labral repair; biceps tendon release; removal of bone spurs 03/14/23.  She has minimal pain due to remaining effects of spinal block for surgery. Bulky dressing limits movement and the effect of ice. Remains in the sling.  Cervical MRI shows degenerative changes C5/6. MD will send order for cervical spine.   Patient reports continued pain in the L UE symptoms. She has stiffness and tightness in the L shoulder and the shoulder is tighter. She does not have the results of the MRI. She is scheduled to see the orthopedist, Dr Aundria Rud, tomorrow 03/17/23. She does not know results of neck MRI. [name] reports that she is taking gabapentin 3 times a day;  the muscle relaxants every 6 hours; finished a steroid dose pack; percocet as needed. She started the antiinflammatory medication this morning. She has less of the sharp pain and can tell the medication is helping. She received a steroid injection with [place]. Pain in the L neck and shoulder area continues. She has tingling and numbness in the ring and little fingers and the thumb and now in the palm of her hand on an intermittent basis. She is having difficulty getting comfortable to sleep.    EVAL: Patient reports that she was lifting her luggage out of the overhead bin when she felt pain in the Lt shoulder and has had some pain in an intermittent basis. She had an injection in Lt shoulder 12/22/22 with some improvement but shoulder is stiff and painful.  Hand dominance: Right  PERTINENT HISTORY: Rt shoulder pain 3 yrs with shoulder surgery for labrum tear and biceps tendon; adhesive capsulitis; reactive hypoglycemic disease; fatty liver  PAIN:   Are you having pain? Yes: NPRS scale: 3/10 biceps; in the night  6/10  Pain location: R shoulder/arm/shoulder blade area  Pain description: sharp; stabbing pain in biceps radiating to elbow with big reaching to the side  Aggravating factors: reaching overhead to the side and behind  Relieving factors: ice; TENs unit   PRECAUTIONS: None  WEIGHT BEARING RESTRICTIONS: No  FALLS:  Has patient fallen in last 6 months? No   PATIENT GOALS:get rid of the shoulder pain and increase ROM   NEXT MD VISIT: 03/29/23 (Dr Aundria Rud)   OBJECTIVE:   DIAGNOSTIC FINDINGS:  None available for shoulder; x-ray (-)   02/13/23 MRI Lt shoulder -  some joint space narrowing, subchondral sclerosis, subchondral cyst formation and osteophyte formation in the glenohumeral joint, indicative of osteoarthritis.  POSTURE: Patient presents with head forward posture with increased thoracic kyphosis; shoulders rounded and elevated; scapulae abducted and rotated along the thoracic spine; head of the humerus anterior in orientation.  CERVICAL ROM: 02/02/23 Flexion 43 tight pain Lt upper trap  Extension 48 tight ant/post neck R lateral flexion 36 tight L cervical  L lateral flexion 42 R rotation 71 L rotation 62 tight pain Lt cervical to upper trap    UPPER EXTREMITY ROM:   Active ROM Right eval Left eval Left  01/11/23 Left  02/02/23 Left(passive) 03/15/23 Left passive 03/29/23  AAROM  04/04/23  Shoulder flexion 160 112 130 107 pain  80 125 138  Shoulder extension 59 25 48 31 pain  NT NT   Shoulder abduction (scaption) 168 112 133 92 pain  NT 84 92  Shoulder adduction         Shoulder internal rotation T7 Thumb to waist Thumb  T12 Hand lateral hip pain  NT neutral   Shoulder external rotation (in scapular plane) 95 85 93 45 pain  NT 70 75  Elbow flexion     120 WNL   Elbow extension     0 WFL   Wrist flexion         Wrist extension  Wrist ulnar deviation         Wrist radial deviation         Wrist  pronation         Wrist supination         (Blank rows = not tested) PASSIVE ROM - supine flexion 152; scaption 147; ER 90  UPPER EXTREMITY MMT: strength not assessed - moves Lt UE well against gravity; limited by pain  MMT Right eval Left eval  Shoulder flexion    Shoulder extension    Shoulder abduction    Shoulder adduction    Shoulder internal rotation    Shoulder external rotation    Middle trapezius    Lower trapezius    Elbow flexion    Elbow extension    Wrist flexion    Wrist extension    Wrist ulnar deviation    Wrist radial deviation    Wrist pronation    Wrist supination    Grip strength (lbs)    (Blank rows = not tested)  JOINT MOBILITY TESTING:  Significant joint tightness Lt shoulder in all planes  02/02/23: Significant joint tightness Lt shoulder in all planes 03/15/23: NT  UPPER LIMB TENSION TEST:  Positive L with patient unable to achieve 90 deg shoulder abduction for testing. UE in ~ 60 deg abduction with elbow extension produced radicular symptoms into Lt UE   PALPATION:  Muscular tightness Lt shoulder through the upper trap; pecs; leveator; teres 02/02/23: muscular tightness Lt shoulder girdle in upper trap; pecs; leveator; rhomboids; teres 02/09/23: muscular tightness Lt cervical and upper trap area through the shoulder girdle including pecs; leveator; rhomboids; teres 02/15/23: persistent tightness L cervical and upper trap area  03/15/23: muscular tightness L shoulder girdle    [facility] Adult PT Treatment:                                                DATE: 04/04/2023 Therapeutic Exercise: Pulley  Flexion, ab/add in scaption Standing with noodle: Chin tucks 5x10" Scap  squeezes Shoulder pendulums Chest lift  Shoulder flexion stretch at counter (AAROM walk out/in) Prone  Shoulder extension 3 sec x 5 Axial extension with scap squeeze 3 sec x 5  Manual Therapy: Skilled palpation to assess response to DN and manual work Psychiatrist   Treatment instructions: Expect mild to moderate muscle soreness. S/S of pneumothorax if dry needled over a lung field, and to seek immediate medical attention should they occur. Patient verbalized understanding of these instructions and education.  Patient Consent Given: Yes Education handout provided: Previously provided Muscles treated: L lateral cervical; upper trap; infraspinatus   Electrical stimulation performed: yes Parameters: mAmp current intensity adjusted to patient tolerance Treatment response/outcome: decreased palpable tightness and decreased radicular symptoms L UE STM bilat, L > R,  cervical musculature; L shoulder girdle Scapular mobilization GH mobs  Seated GH mobilization with pillow case Supine shoulder PROM flexion; scaption; ER in scapular plane  Kinesotaping  Inhibition of upper trap 1 strip of tape 70% pull  Modalities: Ice packs L shoulder complex x15 Cervical moist heat x15  [facility] Adult PT Treatment:  DATE: 03/31/2023 Therapeutic Exercise: Pulley  Trial of flexion, ab/add in scaption Standing with noodle: Chin tucks 5x10" Scap  squeezes Shoulder pendulums Shoulder flexion stretch at counter (AAROM walk out/in) Manual Therapy: Skilled palpation to assess response to DN and manual work Psychiatrist  Treatment instructions: Expect mild to moderate muscle soreness. S/S of pneumothorax if dry needled over a lung field, and to seek immediate medical attention should they occur. Patient verbalized understanding of these instructions and education.  Patient Consent Given: Yes Education handout provided: Previously provided Muscles treated: L lateral cervical; upper trap  Electrical stimulation performed: No Parameters: N/A Treatment response/outcome: decreased palpable tightness and decreased radicular symptoms L UE STM bilat, L > R,  cervical musculature; L shoulder girdle Scapular mobilization GH  mobs  Seated GH mobilization with pillow case Supine shoulder PROM flexion; scaption; ER in scapular plane  Modalities: Ice packs L shoulder complex x15 Cervical moist heat x15   [facility] Adult PT Treatment:                                                DATE: 03/29/2023 Therapeutic Exercise: Pulley  Trial of flexion, ab/add in scaption Standing with noodle: Chin tucks 5x10" Scap  squeezes Shoulder pendulums Shoulder flexion stretch at counter (AAROM walk out/in) Manual Therapy: STM bilat cervical musculature; L shoulder girdle Scapular mobilization Gentle GH mobs  Seated scap MWM (pillow case)  Supine shoulder PROM flexion; scaption; ER in scapular plane  Modalities: Ice packs L shoulder complex x15 Cervical moist heat x15   [facility] Adult PT Treatment:                                                DATE: 03/24/2023 Therapeutic Exercise: Shoulder flexion stretch at counter (AAROM walk out/in) Standing with noodle: Chin tucks 5x10" Scap  squeezes Shoulder pendulums Manual Therapy: Seated scap MWM (pillow case) --> STM traps/LS, rhomboids Supine shoulder PROM flexion  Modalities: Ice packs L shoulder complex x15 Cervical moist heat x15    [facility] Adult PT Treatment:                                                DATE: 03/22/2023 Therapeutic Exercise: Seated: Elbow ext/flex  Wrist circles Standing with noodle: Scap  squeezes Chin tucks 5x10" Shoulder pendulums Shoulder flexion stretch at counter (AAROM walk out/in) Manual Therapy: Seated scap MWM (pillow case) --> STM L UT/LS Supine shoulder PROM flexion --> STM pecs --> scap mobs Modalities: Vaso L shoulder, 34 degrees,no compression x10  Cervical moist heat x10   PATIENT EDUCATION: Education details: POC; HEP Person educated: Patient Education method: Programmer, multimedia, Demonstration, Tactile cues, Verbal cues, and Handouts Education comprehension: verbalized understanding, returned demonstration, verbal cues required,  tactile cues required, and needs further education  HOME EXERCISE PROGRAM: (from treatment prior to surgery 03/14/23)  E-mailed to: [email or web address]   Access Code: [email or web address] URL: [email or web address] Date: 01/13/2023 Prepared by: Corlis Leak  Exercises - Seated Shoulder Flexion AAROM with Pulley Behind  - 2 x daily - 7 x weekly - 1 sets - 10 reps - 10 sec  hold - Seated Shoulder Scaption AAROM with Pulley at Side  - 2 x daily - 7 x weekly - 1 sets - 10 reps - 10sec  hold - Seated Cervical Retraction  - 2 x daily - 7 x weekly - 1-2 sets - 5-10 reps - 10 sec  hold - Seated Scapular Retraction  - 2 x daily - 7 x weekly - 1-2 sets - 10 reps - 10 sec  hold - Standing Scapular Retraction  - 3 x daily - 7 x weekly - 1 sets - 10 reps - 10 sec  hold - Shoulder External Rotation and Scapular Retraction  - 3 x daily - 7 x weekly - 1 sets - 10 reps - 3-5 sec   hold - Seated Shoulder W  - 2 x daily - 7 x weekly - 1 sets - 10 reps - 3 sec  hold - Shoulder External Rotation and Scapular Retraction with Resistance  - 2 x daily - 7 x weekly - 1 sets - 10 reps - 3-5 sec  hold - Shoulder W - External Rotation with Resistance  - 2 x daily - 7 x weekly - 1-2 sets - 10 reps - 3 sec  hold - Standing Bilateral Low Shoulder Row with Anchored Resistance  - 2 x daily - 7 x weekly - 1-3 sets - 10 reps - 2-3 sec  hold - Drawing Bow  - 1 x daily - 7 x weekly - 1 sets - 10 reps - 3 sec  hold - Prone Scapular Retraction  - 2 x daily - 7 x weekly - 1 sets - 5-10 reps - 3-5 sec  hold - Cat Cow  - 2 x daily - 7 x weekly - 1 sets - 5-10 reps - 3-5 sec  hold - Cat Cow to Child's Pose  - 2 x daily - 7 x weekly - 1 sets - 3 reps - 20-30 sec  hold  ASSESSMENT:  CLINICAL IMPRESSION: Patient reports decreased pain and improved sleep with current medications. She tolerated dry needling with good response to initial treatment and again today. Patient is working on [employer]. Note some improving muscular tightness through L  shoulder girdle with persistent tightness in [facility] joint with decreased mobility and stability in the L scapulothoracic range. Exercises continued within protocol guidelines.   Re-eval: 03/15/23: patient returns post surgery 03/14/23 for L shoulder debridement; labral repair; biceps tendon release; removal of bone spurs. She presents with L UE in sling with bulky dressing L shoulder. Patient has some pain but this is minimal due to lasting effects of spinal block. She has poor posture and alignment; limited cervical and L UE mobility; limited functional activity tolerance with no lifting allowed L UE; pain on a constant basis. Patient will benefit from PT to address problems identified.    03/07/23: Patient reports continued pain which is variable in intensity. She was seen by MD and has had a cervical MRI but she does not have results and will not see MD until 03/17/23. Patient has continued radicular UE symptoms that seem cervical in nature. Shoulder ROM is decreased and patient is unable to tolerate PROM/stretching for L shoulder. We will continue with treatment as tolerated and patient schedules. She is continuing with meds and exercises as tolerated from HEP. She continues to have cervical involvement of L UE symptoms as well as shoulder pathology. She has positive ULTT and muscular tightness to palpation through the L cervical and upper trap/clavicular area. Responds  favorably to intervention through the L cervical spine but decreased symptoms are temporary. Continue axial extension in supine and neural mobilization as pt tolerates. MRI shows arthritis in L shoulder but no RC pathology.   OBJECTIVE IMPAIRMENTS: Lt shoulder pain which started with lifting overhead her luggage when travelling. She has had gradually increasing pain and stiffness in the Lt shoulder. She received an injection ~ 1-2 weeks ago with some improvement. Patient presents with poor posture and alignment; limited A and PROM Lt shoulder;  functional weakness due to pain; decreased functional activities and pain on a daily basis    GOALS: Goals reviewed with patient? Yes  SHORT TERM GOALS: Target date: 04/26/2023  Independent in initial HEP  Baseline: Goal status: INITIAL  2.  Increase AROM shoulder flexion and abduction to 100-110 degrees  Baseline:  Goal status: INITIAL   LONG TERM GOALS: Target date: 06/07/2023  Decrease pain in the Lt shoulder by 75-100% allowing patient to return to normal functional activities  Baseline:  Goal status: INITIAL  2.  AROM Lt shoulder WFL's and pain free  Baseline:  Goal status: INITIAL  3.  Patient reports return to normal functional activities including lifting grandson with minimal to no increase in Lt shoulder pain  Baseline:  Goal status: INITIAL  4.  Independent in HEP  Baseline:  Goal status: INITIAL  PLAN:  PT FREQUENCY: 2x/week  PT DURATION: 12 weeks  PLANNED INTERVENTIONS: Therapeutic exercises, Therapeutic activity, Neuromuscular re-education, Patient/Family education, Self Care, Joint mobilization, Aquatic Therapy, Dry Needling, Electrical stimulation, Spinal mobilization, Cryotherapy, Moist heat, Taping, Vasopneumatic device, Ultrasound, Ionotophoresis 4mg /ml Dexamethasone, Manual therapy, and Re-evaluation  PLAN FOR NEXT SESSION: review and progress exercises; continue with postural correction and education; manual work, DN, modalities as indicated    [employer], PT 04/04/2023, 10:24 AM

## 2023-04-06 ENCOUNTER — Ambulatory Visit: Payer: BC Managed Care – PPO | Admitting: Rehabilitative and Restorative Service Providers"

## 2023-04-06 ENCOUNTER — Encounter: Payer: Self-pay | Admitting: Rehabilitative and Restorative Service Providers"

## 2023-04-06 DIAGNOSIS — M25512 Pain in left shoulder: Secondary | ICD-10-CM | POA: Diagnosis not present

## 2023-04-06 DIAGNOSIS — G8929 Other chronic pain: Secondary | ICD-10-CM

## 2023-04-06 DIAGNOSIS — R29898 Other symptoms and signs involving the musculoskeletal system: Secondary | ICD-10-CM | POA: Diagnosis not present

## 2023-04-06 DIAGNOSIS — M25511 Pain in right shoulder: Secondary | ICD-10-CM | POA: Diagnosis not present

## 2023-04-06 DIAGNOSIS — M6281 Muscle weakness (generalized): Secondary | ICD-10-CM

## 2023-04-06 NOTE — Therapy (Signed)
OUTPATIENT PHYSICAL THERAPY SHOULDER TREATMENT   Patient Name: Lori King MRN: [id number] DOB:10/31/1965, 57 y.o., female Today's Date: 04/06/2023  END OF SESSION:  PT End of Session - 04/06/23 0851     Visit Number 8    Number of Visits 24    Date for PT Re-Evaluation 06/07/23    Authorization Type [employer] [id number]    PT Start Time 0849    PT Stop Time 0937    PT Time Calculation (min) 48 min    Activity Tolerance Patient tolerated treatment well             Past Medical History:  Diagnosis Date   Adhesive capsulitis of right shoulder    Allergic rhinitis, seasonal    Anemia    Anxiety    MDD (major depressive disorder)    Wears contact lenses    Past Surgical History:  Procedure Laterality Date   BREAST BIOPSY Left 2010   x2 per pt benign   CESAREAN SECTION  X2  last one 02-28-2001   W/  BILATERAL TUBAL LIGATION w/ last c/s   COLONOSCOPY  11/01/2017   HYSTEROSCOPY W/ ENDOMETRIAL ABLATION  2019   LUMBAR LAMINECTOMY/DECOMPRESSION MICRODISCECTOMY  2013   L4  --- S1   SHOULDER ARTHROSCOPY Right 08/28/2020   Procedure: ARTHROSCOPY SHOULDER lysis of adhesions and manipulation under anesthesia, extensive debridement;  Surgeon: Yolonda Kida, MD;  Location: [facility];  Service: Orthopedics;  Laterality: Right;   There are no problems to display for this patient.   PCP: Dr Angelica Chessman  REFERRING PROVIDER: Dr Arlyce Harman  REFERRING DIAG: pain of Lt shoulder  THERAPY DIAG:  Acute pain of left shoulder  Muscle weakness (generalized)  Other symptoms and signs involving the musculoskeletal system  Chronic right shoulder pain  Rationale for Evaluation and Treatment: Rehabilitation  ONSET DATE: 10/24/22  SUBJECTIVE:                                                                                                                                                                                      SUBJECTIVE STATEMENT: Patient  reports that she had to stop taking the percocet due to sleep apnea. She is continuing to take the other meds. Shoulder was sore after last treatment but feels more mobile. Patient is continuing to work on exercises at home. She is sleeping better with new medication.     03/15/23 RE-EVAL: Patient underwent L shoulder surgery with extensive debridement; labral repair; biceps tendon release; removal of bone spurs 03/14/23.  She has minimal pain due to remaining effects of spinal block for surgery. Bulky dressing limits movement and the effect  of ice. Remains in the sling.   Cervical MRI shows degenerative changes C5/6. MD will send order for cervical spine.   Patient reports continued pain in the L UE symptoms. She has stiffness and tightness in the L shoulder and the shoulder is tighter. She does not have the results of the MRI. She is scheduled to see the orthopedist, Dr Aundria Rud, tomorrow 03/17/23. She does not know results of neck MRI. [name] reports that she is taking gabapentin 3 times a day;  the muscle relaxants every 6 hours; finished a steroid dose pack; percocet as needed. She started the antiinflammatory medication this morning. She has less of the sharp pain and can tell the medication is helping. She received a steroid injection with [place]. Pain in the L neck and shoulder area continues. She has tingling and numbness in the ring and little fingers and the thumb and now in the palm of her hand on an intermittent basis. She is having difficulty getting comfortable to sleep.    EVAL: Patient reports that she was lifting her luggage out of the overhead bin when she felt pain in the Lt shoulder and has had some pain in an intermittent basis. She had an injection in Lt shoulder 12/22/22 with some improvement but shoulder is stiff and painful.  Hand dominance: Right  PERTINENT HISTORY: Rt shoulder pain 3 yrs with shoulder surgery for labrum tear and biceps tendon; adhesive capsulitis; reactive  hypoglycemic disease; fatty liver  PAIN:  Are you having pain? Yes: NPRS scale: 5-6/10 biceps; in the night  6/10  Pain location: R shoulder/arm/shoulder blade area  Pain description: sharp; stabbing pain in biceps radiating to elbow with big reaching to the side  Aggravating factors: reaching overhead to the side and behind  Relieving factors: ice; TENs unit   PRECAUTIONS: None  WEIGHT BEARING RESTRICTIONS: No  FALLS:  Has patient fallen in last 6 months? No   PATIENT GOALS:get rid of the shoulder pain and increase ROM   NEXT MD VISIT: 03/29/23 (Dr Aundria Rud)   OBJECTIVE:   DIAGNOSTIC FINDINGS:  None available for shoulder; x-ray (-)   02/13/23 MRI Lt shoulder -  some joint space narrowing, subchondral sclerosis, subchondral cyst formation and osteophyte formation in the glenohumeral joint, indicative of osteoarthritis.  POSTURE: Patient presents with head forward posture with increased thoracic kyphosis; shoulders rounded and elevated; scapulae abducted and rotated along the thoracic spine; head of the humerus anterior in orientation.  CERVICAL ROM: 02/02/23 Flexion 43 tight pain Lt upper trap  Extension 48 tight ant/post neck R lateral flexion 36 tight L cervical  L lateral flexion 42 R rotation 71 L rotation 62 tight pain Lt cervical to upper trap    UPPER EXTREMITY ROM:   Active ROM Right eval Left eval Left  01/11/23 Left  02/02/23 Left(passive) 03/15/23 Left passive 03/29/23  AAROM  04/04/23  Shoulder flexion 160 112 130 107 pain  80 125 138  Shoulder extension 59 25 48 31 pain  NT NT   Shoulder abduction (scaption) 168 112 133 92 pain  NT 84 92  Shoulder adduction         Shoulder internal rotation T7 Thumb to waist Thumb  T12 Hand lateral hip pain  NT neutral   Shoulder external rotation (in scapular plane) 95 85 93 45 pain  NT 70 75  Elbow flexion     120 WNL   Elbow extension     0 WFL   Wrist flexion  Wrist extension         Wrist ulnar deviation          Wrist radial deviation         Wrist pronation         Wrist supination         (Blank rows = not tested) PASSIVE ROM - supine flexion 152; scaption 147; ER 90  UPPER EXTREMITY MMT: strength not assessed - moves Lt UE well against gravity; limited by pain  MMT Right eval Left eval  Shoulder flexion    Shoulder extension    Shoulder abduction    Shoulder adduction    Shoulder internal rotation    Shoulder external rotation    Middle trapezius    Lower trapezius    Elbow flexion    Elbow extension    Wrist flexion    Wrist extension    Wrist ulnar deviation    Wrist radial deviation    Wrist pronation    Wrist supination    Grip strength (lbs)    (Blank rows = not tested)  JOINT MOBILITY TESTING:  Significant joint tightness Lt shoulder in all planes  02/02/23: Significant joint tightness Lt shoulder in all planes 03/15/23: NT  UPPER LIMB TENSION TEST:  Positive L with patient unable to achieve 90 deg shoulder abduction for testing. UE in ~ 60 deg abduction with elbow extension produced radicular symptoms into Lt UE   PALPATION:  Muscular tightness Lt shoulder through the upper trap; pecs; leveator; teres 02/02/23: muscular tightness Lt shoulder girdle in upper trap; pecs; leveator; rhomboids; teres 02/09/23: muscular tightness Lt cervical and upper trap area through the shoulder girdle including pecs; leveator; rhomboids; teres 02/15/23: persistent tightness L cervical and upper trap area  03/15/23: muscular tightness L shoulder girdle    [facility] Adult PT Treatment:                                                DATE: 04/06/2023 Therapeutic Exercise: Pulley  Flexion, ab/add in scaption Standing with noodle: Doorway 2 lower positions  Row red TB x 5 Wall slide L UE x 3  Chin tucks 5x10" Scap  squeezes Shoulder pendulums Chest lift  Shoulder flexion stretch at counter (AAROM walk out/in) Prone  Shoulder extension 3 sec x 5 Axial extension with scap squeeze 3 sec x 5   Manual Therapy: Skilled palpation to assess response to DN and manual work Psychiatrist  Treatment instructions: Expect mild to moderate muscle soreness. S/S of pneumothorax if dry needled over a lung field, and to seek immediate medical attention should they occur. Patient verbalized understanding of these instructions and education.  Patient Consent Given: Yes Education handout provided: Previously provided Muscles treated: L lateral cervical; upper trap; infraspinatus   Electrical stimulation performed: yes Parameters: mAmp current intensity adjusted to patient tolerance Treatment response/outcome: decreased palpable tightness and decreased radicular symptoms L UE STM bilat, L > R,  cervical musculature; L shoulder girdle Scapular mobilization GH mobs  Seated GH mobilization with pillow case Supine shoulder PROM flexion; scaption; ER in scapular plane  Kinesotaping  Inhibition of upper trap 1 strip of tape 70% pull  Modalities: Ice packs L shoulder complex x15 Cervical moist heat x15  [facility] Adult PT Treatment:  DATE: 04/04/2023 Therapeutic Exercise: Pulley  Flexion, ab/add in scaption Standing with noodle: Chin tucks 5x10" Scap  squeezes Shoulder pendulums Chest lift  Shoulder flexion stretch at counter (AAROM walk out/in) Prone  Shoulder extension 3 sec x 5 Axial extension with scap squeeze 3 sec x 5  Manual Therapy: Skilled palpation to assess response to DN and manual work Psychiatrist  Treatment instructions: Expect mild to moderate muscle soreness. S/S of pneumothorax if dry needled over a lung field, and to seek immediate medical attention should they occur. Patient verbalized understanding of these instructions and education.  Patient Consent Given: Yes Education handout provided: Previously provided Muscles treated: L lateral cervical; upper trap; infraspinatus   Electrical stimulation  performed: yes Parameters: mAmp current intensity adjusted to patient tolerance Treatment response/outcome: decreased palpable tightness and decreased radicular symptoms L UE STM bilat, L > R,  cervical musculature; L shoulder girdle Scapular mobilization GH mobs  Seated GH mobilization with pillow case Supine shoulder PROM flexion; scaption; ER in scapular plane  Kinesotaping  Inhibition of upper trap 1 strip of tape 70% pull  Modalities: Ice packs L shoulder complex x15 Cervical moist heat x15  [facility] Adult PT Treatment:                                                DATE: 03/31/2023 Therapeutic Exercise: Pulley  Trial of flexion, ab/add in scaption Standing with noodle: Chin tucks 5x10" Scap  squeezes Shoulder pendulums Shoulder flexion stretch at counter (AAROM walk out/in) Manual Therapy: Skilled palpation to assess response to DN and manual work Psychiatrist  Treatment instructions: Expect mild to moderate muscle soreness. S/S of pneumothorax if dry needled over a lung field, and to seek immediate medical attention should they occur. Patient verbalized understanding of these instructions and education.  Patient Consent Given: Yes Education handout provided: Previously provided Muscles treated: L lateral cervical; upper trap  Electrical stimulation performed: No Parameters: N/A Treatment response/outcome: decreased palpable tightness and decreased radicular symptoms L UE STM bilat, L > R,  cervical musculature; L shoulder girdle Scapular mobilization GH mobs  Seated GH mobilization with pillow case Supine shoulder PROM flexion; scaption; ER in scapular plane  Modalities: Ice packs L shoulder complex x15 Cervical moist heat x15   [facility] Adult PT Treatment:                                                DATE: 03/29/2023 Therapeutic Exercise: Pulley  Trial of flexion, ab/add in scaption Standing with noodle: Chin tucks 5x10" Scap  squeezes Shoulder  pendulums Shoulder flexion stretch at counter (AAROM walk out/in) Manual Therapy: STM bilat cervical musculature; L shoulder girdle Scapular mobilization Gentle GH mobs  Seated scap MWM (pillow case)  Supine shoulder PROM flexion; scaption; ER in scapular plane  Modalities: Ice packs L shoulder complex x15 Cervical moist heat x15   [facility] Adult PT Treatment:                                                DATE: 03/24/2023 Therapeutic Exercise: Shoulder flexion stretch  at counter (AAROM walk out/in) Standing with noodle: Chin tucks 5x10" Scap  squeezes Shoulder pendulums Manual Therapy: Seated scap MWM (pillow case) --> STM traps/LS, rhomboids Supine shoulder PROM flexion  Modalities: Ice packs L shoulder complex x15 Cervical moist heat x15    [facility] Adult PT Treatment:                                                DATE: 03/22/2023 Therapeutic Exercise: Seated: Elbow ext/flex  Wrist circles Standing with noodle: Scap  squeezes Chin tucks 5x10" Shoulder pendulums Shoulder flexion stretch at counter (AAROM walk out/in) Manual Therapy: Seated scap MWM (pillow case) --> STM L UT/LS Supine shoulder PROM flexion --> STM pecs --> scap mobs Modalities: Vaso L shoulder, 34 degrees,no compression x10  Cervical moist heat x10   PATIENT EDUCATION: Education details: POC; HEP Person educated: Patient Education method: Programmer, multimedia, Demonstration, Tactile cues, Verbal cues, and Handouts Education comprehension: verbalized understanding, returned demonstration, verbal cues required, tactile cues required, and needs further education  HOME EXERCISE PROGRAM: (from treatment prior to surgery 03/14/23)  E-mailed to: [email or web address]   Access Code: [email or web address] URL: [email or web address] Date: 01/13/2023 Prepared by: Corlis Leak  Exercises - Seated Shoulder Flexion AAROM with Pulley Behind  - 2 x daily - 7 x weekly - 1 sets - 10 reps - 10 sec  hold - Seated Shoulder  Scaption AAROM with Pulley at Side  - 2 x daily - 7 x weekly - 1 sets - 10 reps - 10sec  hold - Seated Cervical Retraction  - 2 x daily - 7 x weekly - 1-2 sets - 5-10 reps - 10 sec  hold - Seated Scapular Retraction  - 2 x daily - 7 x weekly - 1-2 sets - 10 reps - 10 sec  hold - Standing Scapular Retraction  - 3 x daily - 7 x weekly - 1 sets - 10 reps - 10 sec  hold - Shoulder External Rotation and Scapular Retraction  - 3 x daily - 7 x weekly - 1 sets - 10 reps - 3-5 sec   hold - Seated Shoulder W  - 2 x daily - 7 x weekly - 1 sets - 10 reps - 3 sec  hold - Shoulder External Rotation and Scapular Retraction with Resistance  - 2 x daily - 7 x weekly - 1 sets - 10 reps - 3-5 sec  hold - Shoulder W - External Rotation with Resistance  - 2 x daily - 7 x weekly - 1-2 sets - 10 reps - 3 sec  hold - Standing Bilateral Low Shoulder Row with Anchored Resistance  - 2 x daily - 7 x weekly - 1-3 sets - 10 reps - 2-3 sec  hold - Drawing Bow  - 1 x daily - 7 x weekly - 1 sets - 10 reps - 3 sec  hold - Prone Scapular Retraction  - 2 x daily - 7 x weekly - 1 sets - 5-10 reps - 3-5 sec  hold - Cat Cow  - 2 x daily - 7 x weekly - 1 sets - 5-10 reps - 3-5 sec  hold - Cat Cow to Child's Pose  - 2 x daily - 7 x weekly - 1 sets - 3 reps - 20-30 sec  hold  ASSESSMENT:  CLINICAL IMPRESSION: Patient reports some increased pain with  change in meds but she is still sleeping better and feels she is gaining motion in the shoulder. She tolerated dry needling with good response to initial treatment and again today. Patient is working on [employer]. Note some improving muscular tightness through L shoulder girdle with persistent tightness in [facility] joint with decreased mobility and stability in the L scapulothoracic range. Exercises continued within protocol guidelines.   Re-eval: 03/15/23: patient returns post surgery 03/14/23 for L shoulder debridement; labral repair; biceps tendon release; removal of bone spurs. She presents with L UE  in sling with bulky dressing L shoulder. Patient has some pain but this is minimal due to lasting effects of spinal block. She has poor posture and alignment; limited cervical and L UE mobility; limited functional activity tolerance with no lifting allowed L UE; pain on a constant basis. Patient will benefit from PT to address problems identified.    03/07/23: Patient reports continued pain which is variable in intensity. She was seen by MD and has had a cervical MRI but she does not have results and will not see MD until 03/17/23. Patient has continued radicular UE symptoms that seem cervical in nature. Shoulder ROM is decreased and patient is unable to tolerate PROM/stretching for L shoulder. We will continue with treatment as tolerated and patient schedules. She is continuing with meds and exercises as tolerated from HEP. She continues to have cervical involvement of L UE symptoms as well as shoulder pathology. She has positive ULTT and muscular tightness to palpation through the L cervical and upper trap/clavicular area. Responds favorably to intervention through the L cervical spine but decreased symptoms are temporary. Continue axial extension in supine and neural mobilization as pt tolerates. MRI shows arthritis in L shoulder but no RC pathology.   OBJECTIVE IMPAIRMENTS: Lt shoulder pain which started with lifting overhead her luggage when travelling. She has had gradually increasing pain and stiffness in the Lt shoulder. She received an injection ~ 1-2 weeks ago with some improvement. Patient presents with poor posture and alignment; limited A and PROM Lt shoulder; functional weakness due to pain; decreased functional activities and pain on a daily basis    GOALS: Goals reviewed with patient? Yes  SHORT TERM GOALS: Target date: 04/26/2023  Independent in initial HEP  Baseline: Goal status: INITIAL  2.  Increase AROM shoulder flexion and abduction to 100-110 degrees  Baseline:  Goal status:  INITIAL   LONG TERM GOALS: Target date: 06/07/2023  Decrease pain in the Lt shoulder by 75-100% allowing patient to return to normal functional activities  Baseline:  Goal status: INITIAL  2.  AROM Lt shoulder WFL's and pain free  Baseline:  Goal status: INITIAL  3.  Patient reports return to normal functional activities including lifting grandson with minimal to no increase in Lt shoulder pain  Baseline:  Goal status: INITIAL  4.  Independent in HEP  Baseline:  Goal status: INITIAL  PLAN:  PT FREQUENCY: 2x/week  PT DURATION: 12 weeks  PLANNED INTERVENTIONS: Therapeutic exercises, Therapeutic activity, Neuromuscular re-education, Patient/Family education, Self Care, Joint mobilization, Aquatic Therapy, Dry Needling, Electrical stimulation, Spinal mobilization, Cryotherapy, Moist heat, Taping, Vasopneumatic device, Ultrasound, Ionotophoresis 4mg /ml Dexamethasone, Manual therapy, and Re-evaluation  PLAN FOR NEXT SESSION: review and progress exercises; continue with postural correction and education; manual work, DN, modalities as indicated    [employer], PT 04/06/2023, 8:52 AM

## 2023-04-07 ENCOUNTER — Ambulatory Visit: Payer: BC Managed Care – PPO | Admitting: Rehabilitative and Restorative Service Providers"

## 2023-04-12 ENCOUNTER — Ambulatory Visit: Payer: BC Managed Care – PPO | Admitting: Rehabilitative and Restorative Service Providers"

## 2023-04-12 ENCOUNTER — Encounter: Payer: Self-pay | Admitting: Rehabilitative and Restorative Service Providers"

## 2023-04-12 DIAGNOSIS — M6281 Muscle weakness (generalized): Secondary | ICD-10-CM

## 2023-04-12 DIAGNOSIS — G8929 Other chronic pain: Secondary | ICD-10-CM | POA: Diagnosis not present

## 2023-04-12 DIAGNOSIS — M25511 Pain in right shoulder: Secondary | ICD-10-CM | POA: Diagnosis not present

## 2023-04-12 DIAGNOSIS — R29898 Other symptoms and signs involving the musculoskeletal system: Secondary | ICD-10-CM

## 2023-04-12 DIAGNOSIS — M25512 Pain in left shoulder: Secondary | ICD-10-CM

## 2023-04-12 NOTE — Therapy (Signed)
OUTPATIENT PHYSICAL THERAPY SHOULDER TREATMENT   Patient Name: Lori King MRN: [id number] DOB:1966-06-16, 57 y.o., female Today's Date: 04/12/2023  END OF SESSION:  PT End of Session - 04/12/23 1016     Visit Number 9    Number of Visits 24    Date for PT Re-Evaluation 06/07/23    Authorization Type [employer] [id number]    PT Start Time 1015    PT Stop Time 1105    PT Time Calculation (min) 50 min    Activity Tolerance Patient tolerated treatment well             Past Medical History:  Diagnosis Date   Adhesive capsulitis of right shoulder    Allergic rhinitis, seasonal    Anemia    Anxiety    MDD (major depressive disorder)    Wears contact lenses    Past Surgical History:  Procedure Laterality Date   BREAST BIOPSY Left 2010   x2 per pt benign   CESAREAN SECTION  X2  last one 02-28-2001   W/  BILATERAL TUBAL LIGATION w/ last c/s   COLONOSCOPY  11/01/2017   HYSTEROSCOPY W/ ENDOMETRIAL ABLATION  2019   LUMBAR LAMINECTOMY/DECOMPRESSION MICRODISCECTOMY  2013   L4  --- S1   SHOULDER ARTHROSCOPY Right 08/28/2020   Procedure: ARTHROSCOPY SHOULDER lysis of adhesions and manipulation under anesthesia, extensive debridement;  Surgeon: Yolonda Kida, MD;  Location: [facility] [place];  Service: Orthopedics;  Laterality: Right;   There are no problems to display for this patient.   PCP: Dr Angelica Chessman  REFERRING PROVIDER: Dr Arlyce Harman  REFERRING DIAG: pain of Lt shoulder  THERAPY DIAG:  Acute pain of left shoulder  Muscle weakness (generalized)  Other symptoms and signs involving the musculoskeletal system  Chronic right shoulder pain  Rationale for Evaluation and Treatment: Rehabilitation  ONSET DATE: 10/24/22  SUBJECTIVE:                                                                                                                                                                                      SUBJECTIVE STATEMENT: Patient  reports that she had increased pain following last PT and again over the weekend lasting for a day or so. She was able to work in the water over the weekend and continues to work on her exercises at home on a daily basis. She is sleeping 2-3 hours before awakening due to pain.   03/15/23 RE-EVAL: Patient underwent L shoulder surgery with extensive debridement; labral repair; biceps tendon release; removal of bone spurs 03/14/23.  She has minimal pain due to remaining effects of spinal block for surgery. Bulky dressing  limits movement and the effect of ice. Remains in the sling.   Cervical MRI shows degenerative changes C5/6. MD will send order for cervical spine.   Patient reports continued pain in the L UE symptoms. She has stiffness and tightness in the L shoulder and the shoulder is tighter. She does not have the results of the MRI. She is scheduled to see the orthopedist, Dr Aundria Rud, tomorrow 03/17/23. She does not know results of neck MRI. [name] reports that she is taking gabapentin 3 times a day;  the muscle relaxants every 6 hours; finished a steroid dose pack; percocet as needed. She started the antiinflammatory medication this morning. She has less of the sharp pain and can tell the medication is helping. She received a steroid injection with [place]. Pain in the L neck and shoulder area continues. She has tingling and numbness in the ring and little fingers and the thumb and now in the palm of her hand on an intermittent basis. She is having difficulty getting comfortable to sleep.    EVAL: Patient reports that she was lifting her luggage out of the overhead bin when she felt pain in the Lt shoulder and has had some pain in an intermittent basis. She had an injection in Lt shoulder 12/22/22 with some improvement but shoulder is stiff and painful.  Hand dominance: Right  PERTINENT HISTORY: Rt shoulder pain 3 yrs with shoulder surgery for labrum tear and biceps tendon; adhesive capsulitis; reactive  hypoglycemic disease; fatty liver  PAIN:  Are you having pain? Yes: NPRS scale: 5-6/10 biceps; in the night  6/10  Pain location: R shoulder/arm/shoulder blade area  Pain description: sharp; stabbing pain in biceps radiating to elbow with big reaching to the side  Aggravating factors: reaching overhead to the side and behind  Relieving factors: ice; TENs unit   PRECAUTIONS: None  WEIGHT BEARING RESTRICTIONS: No  FALLS:  Has patient fallen in last 6 months? No   PATIENT GOALS:get rid of the shoulder pain and increase ROM   NEXT MD VISIT: 03/29/23 (Dr Aundria Rud)   OBJECTIVE:   DIAGNOSTIC FINDINGS:  None available for shoulder; x-ray (-)   02/13/23 MRI Lt shoulder -  some joint space narrowing, subchondral sclerosis, subchondral cyst formation and osteophyte formation in the glenohumeral joint, indicative of osteoarthritis.  POSTURE: Patient presents with head forward posture with increased thoracic kyphosis; shoulders rounded and elevated; scapulae abducted and rotated along the thoracic spine; head of the humerus anterior in orientation.  CERVICAL ROM: 02/02/23 Flexion 43 tight pain Lt upper trap  Extension 48 tight ant/post neck R lateral flexion 36 tight L cervical  L lateral flexion 42 R rotation 71 L rotation 62 tight pain Lt cervical to upper trap    UPPER EXTREMITY ROM:   Active ROM Right eval Left eval Left  01/11/23 Left  02/02/23 Left(passive) 03/15/23 Left passive 03/29/23  AAROM  04/04/23  Shoulder flexion 160 112 130 107 pain  80 125 138  Shoulder extension 59 25 48 31 pain  NT NT   Shoulder abduction (scaption) 168 112 133 92 pain  NT 84 92  Shoulder adduction         Shoulder internal rotation T7 Thumb to waist Thumb  T12 Hand lateral hip pain  NT neutral   Shoulder external rotation (in scapular plane) 95 85 93 45 pain  NT 70 75  Elbow flexion     120 WNL   Elbow extension     0 WFL   Wrist  flexion         Wrist extension         Wrist ulnar deviation          Wrist radial deviation         Wrist pronation         Wrist supination         (Blank rows = not tested) PASSIVE ROM - supine flexion 152; scaption 147; ER 90  UPPER EXTREMITY MMT: strength not assessed - moves Lt UE well against gravity; limited by pain  MMT Right eval Left eval  Shoulder flexion    Shoulder extension    Shoulder abduction    Shoulder adduction    Shoulder internal rotation    Shoulder external rotation    Middle trapezius    Lower trapezius    Elbow flexion    Elbow extension    Wrist flexion    Wrist extension    Wrist ulnar deviation    Wrist radial deviation    Wrist pronation    Wrist supination    Grip strength (lbs)    (Blank rows = not tested)  JOINT MOBILITY TESTING:  Significant joint tightness Lt shoulder in all planes  02/02/23: Significant joint tightness Lt shoulder in all planes 03/15/23: NT  UPPER LIMB TENSION TEST:  Positive L with patient unable to achieve 90 deg shoulder abduction for testing. UE in ~ 60 deg abduction with elbow extension produced radicular symptoms into Lt UE   PALPATION:  Muscular tightness Lt shoulder through the upper trap; pecs; leveator; teres 02/02/23: muscular tightness Lt shoulder girdle in upper trap; pecs; leveator; rhomboids; teres 02/09/23: muscular tightness Lt cervical and upper trap area through the shoulder girdle including pecs; leveator; rhomboids; teres 02/15/23: persistent tightness L cervical and upper trap area  03/15/23: muscular tightness L shoulder girdle    [facility] Adult PT Treatment:                                                DATE: 04/12/2023 Therapeutic Exercise: Pulley  Flexion, ab/add in scaption Standing  Doorway 2 lower positions  Row with noodle red TB x 5 Wall slide L UE x 3  Chin tucks 5x10" Scap  squeezes Shoulder pendulums Chest lift  Shoulder flexion stretch at counter (AAROM walk out/in) Prone  Shoulder extension 3 sec x 5 Axial extension with scap squeeze 3 sec x 5   Manual Therapy: Soft tissue mobilization and trigger point work through the cervical musculature; into L shoulder girdle  Scapular mobilization GH mobs  Supine shoulder PROM flexion; scaption; ER in scapular plane  Modalities: Moist heat L shoulder complex  Cervical moist heat   [facility] Adult PT Treatment:                                                DATE: 04/06/2023 Therapeutic Exercise: Pulley  Flexion, ab/add in scaption Standing with noodle: Doorway 2 lower positions  Row red TB x 5 Wall slide L UE x 3  Chin tucks 5x10" Scap  squeezes Shoulder pendulums Chest lift  Shoulder flexion stretch at counter (AAROM walk out/in) Prone  Shoulder extension 3 sec x 5 Axial extension with scap squeeze 3 sec  x 5  Manual Therapy: Skilled palpation to assess response to DN and manual work Psychiatrist  Treatment instructions: Expect mild to moderate muscle soreness. S/S of pneumothorax if dry needled over a lung field, and to seek immediate medical attention should they occur. Patient verbalized understanding of these instructions and education.  Patient Consent Given: Yes Education handout provided: Previously provided Muscles treated: L lateral cervical; upper trap; infraspinatus   Electrical stimulation performed: yes Parameters: mAmp current intensity adjusted to patient tolerance Treatment response/outcome: decreased palpable tightness and decreased radicular symptoms L UE STM bilat, L > R,  cervical musculature; L shoulder girdle Scapular mobilization GH mobs  Seated GH mobilization with pillow case Supine shoulder PROM flexion; scaption; ER in scapular plane  Kinesotaping  Inhibition of upper trap 1 strip of tape 70% pull  Modalities: Ice packs L shoulder complex x15 Cervical moist heat x15   PATIENT EDUCATION: Education details: POC; HEP Person educated: Patient Education method: Programmer, multimedia, Demonstration, Tactile cues, Verbal cues, and Handouts Education  comprehension: verbalized understanding, returned demonstration, verbal cues required, tactile cues required, and needs further education  HOME EXERCISE PROGRAM: (from treatment prior to surgery 03/14/23)  E-mailed to: [email or web address]   Access Code: [email or web address] URL: [email or web address] Date: 01/13/2023 Prepared by: Corlis Leak  Exercises - Seated Shoulder Flexion AAROM with Pulley Behind  - 2 x daily - 7 x weekly - 1 sets - 10 reps - 10 sec  hold - Seated Shoulder Scaption AAROM with Pulley at Side  - 2 x daily - 7 x weekly - 1 sets - 10 reps - 10sec  hold - Seated Cervical Retraction  - 2 x daily - 7 x weekly - 1-2 sets - 5-10 reps - 10 sec  hold - Seated Scapular Retraction  - 2 x daily - 7 x weekly - 1-2 sets - 10 reps - 10 sec  hold - Standing Scapular Retraction  - 3 x daily - 7 x weekly - 1 sets - 10 reps - 10 sec  hold - Shoulder External Rotation and Scapular Retraction  - 3 x daily - 7 x weekly - 1 sets - 10 reps - 3-5 sec   hold - Seated Shoulder W  - 2 x daily - 7 x weekly - 1 sets - 10 reps - 3 sec  hold - Shoulder External Rotation and Scapular Retraction with Resistance  - 2 x daily - 7 x weekly - 1 sets - 10 reps - 3-5 sec  hold - Shoulder W - External Rotation with Resistance  - 2 x daily - 7 x weekly - 1-2 sets - 10 reps - 3 sec  hold - Standing Bilateral Low Shoulder Row with Anchored Resistance  - 2 x daily - 7 x weekly - 1-3 sets - 10 reps - 2-3 sec  hold - Drawing Bow  - 1 x daily - 7 x weekly - 1 sets - 10 reps - 3 sec  hold - Prone Scapular Retraction  - 2 x daily - 7 x weekly - 1 sets - 5-10 reps - 3-5 sec  hold - Cat Cow  - 2 x daily - 7 x weekly - 1 sets - 5-10 reps - 3-5 sec  hold - Cat Cow to Child's Pose  - 2 x daily - 7 x weekly - 1 sets - 3 reps - 20-30 sec  hold  ASSESSMENT:  CLINICAL IMPRESSION: Patient reports some increased pain in L shoulder following last PT visit and  again Sunday after increased stretching intensity at home. She  continues to use medications as directed and is now sleeping 2-3 hours but awakens with L shoulder pain. Patient tolerates dry needling with good response. Manual work decreases muscular tightness and patient does tolerated PROM/stretching better. She continues to have tight but empty end feel. Note some improving muscular tightness through L shoulder girdle with persistent tightness in [facility] joint with decreased mobility and stability in the L scapulothoracic range. Patient is working on her exercises and ROM consistently at home. PROM ~ 140 deg flexion; 92 deg abduction in scapular plane; 70 deg ER in scapular plane    Re-eval: 03/15/23: patient returns post surgery 03/14/23 for L shoulder debridement; labral repair; biceps tendon release; removal of bone spurs. She presents with L UE in sling with bulky dressing L shoulder. Patient has some pain but this is minimal due to lasting effects of spinal block. She has poor posture and alignment; limited cervical and L UE mobility; limited functional activity tolerance with no lifting allowed L UE; pain on a constant basis. Patient will benefit from PT to address problems identified.    03/07/23: Patient reports continued pain which is variable in intensity. She was seen by MD and has had a cervical MRI but she does not have results and will not see MD until 03/17/23. Patient has continued radicular UE symptoms that seem cervical in nature. Shoulder ROM is decreased and patient is unable to tolerate PROM/stretching for L shoulder. We will continue with treatment as tolerated and patient schedules. She is continuing with meds and exercises as tolerated from HEP. She continues to have cervical involvement of L UE symptoms as well as shoulder pathology. She has positive ULTT and muscular tightness to palpation through the L cervical and upper trap/clavicular area. Responds favorably to intervention through the L cervical spine but decreased symptoms are temporary. Continue  axial extension in supine and neural mobilization as pt tolerates. MRI shows arthritis in L shoulder but no RC pathology.   OBJECTIVE IMPAIRMENTS: Lt shoulder pain which started with lifting overhead her luggage when travelling. She has had gradually increasing pain and stiffness in the Lt shoulder. She received an injection ~ 1-2 weeks ago with some improvement. Patient presents with poor posture and alignment; limited A and PROM Lt shoulder; functional weakness due to pain; decreased functional activities and pain on a daily basis    GOALS: Goals reviewed with patient? Yes  SHORT TERM GOALS: Target date: 04/26/2023  Independent in initial HEP  Baseline: Goal status: INITIAL  2.  Increase AROM shoulder flexion and abduction to 100-110 degrees  Baseline:  Goal status: INITIAL   LONG TERM GOALS: Target date: 06/07/2023  Decrease pain in the Lt shoulder by 75-100% allowing patient to return to normal functional activities  Baseline:  Goal status: INITIAL  2.  AROM Lt shoulder WFL's and pain free  Baseline:  Goal status: INITIAL  3.  Patient reports return to normal functional activities including lifting grandson with minimal to no increase in Lt shoulder pain  Baseline:  Goal status: INITIAL  4.  Independent in HEP  Baseline:  Goal status: INITIAL  PLAN:  PT FREQUENCY: 2x/week  PT DURATION: 12 weeks  PLANNED INTERVENTIONS: Therapeutic exercises, Therapeutic activity, Neuromuscular re-education, Patient/Family education, Self Care, Joint mobilization, Aquatic Therapy, Dry Needling, Electrical stimulation, Spinal mobilization, Cryotherapy, Moist heat, Taping, Vasopneumatic device, Ultrasound, Ionotophoresis 4mg /ml Dexamethasone, Manual therapy, and Re-evaluation  PLAN FOR NEXT SESSION: review and progress exercises; continue  with postural correction and education; manual work, DN, modalities as indicated    [employer], PT 04/12/2023, 10:17 AM

## 2023-04-13 DIAGNOSIS — M542 Cervicalgia: Secondary | ICD-10-CM | POA: Diagnosis not present

## 2023-04-13 DIAGNOSIS — M5412 Radiculopathy, cervical region: Secondary | ICD-10-CM | POA: Diagnosis not present

## 2023-04-14 ENCOUNTER — Ambulatory Visit: Payer: BC Managed Care – PPO | Admitting: Rehabilitative and Restorative Service Providers"

## 2023-04-14 ENCOUNTER — Encounter: Payer: Self-pay | Admitting: Rehabilitative and Restorative Service Providers"

## 2023-04-14 DIAGNOSIS — R29898 Other symptoms and signs involving the musculoskeletal system: Secondary | ICD-10-CM

## 2023-04-14 DIAGNOSIS — M6281 Muscle weakness (generalized): Secondary | ICD-10-CM | POA: Diagnosis not present

## 2023-04-14 DIAGNOSIS — M25511 Pain in right shoulder: Secondary | ICD-10-CM | POA: Diagnosis not present

## 2023-04-14 DIAGNOSIS — M25512 Pain in left shoulder: Secondary | ICD-10-CM | POA: Diagnosis not present

## 2023-04-14 DIAGNOSIS — G8929 Other chronic pain: Secondary | ICD-10-CM | POA: Diagnosis not present

## 2023-04-14 NOTE — Therapy (Signed)
OUTPATIENT PHYSICAL THERAPY SHOULDER TREATMENT   Patient Name: Lori King MRN: [id number] DOB:1966/05/08, 57 y.o., female Today's Date: 04/14/2023  END OF SESSION:  PT End of Session - 04/14/23 1011     Visit Number 10    Number of Visits 24    Date for PT Re-Evaluation 06/07/23    Authorization Type [employer] [id number]    PT Start Time 1011    PT Stop Time 1103    PT Time Calculation (min) 52 min    Activity Tolerance Patient tolerated treatment well             Past Medical History:  Diagnosis Date   Adhesive capsulitis of right shoulder    Allergic rhinitis, seasonal    Anemia    Anxiety    MDD (major depressive disorder)    Wears contact lenses    Past Surgical History:  Procedure Laterality Date   BREAST BIOPSY Left 2010   x2 per pt benign   CESAREAN SECTION  X2  last one 02-28-2001   W/  BILATERAL TUBAL LIGATION w/ last c/s   COLONOSCOPY  11/01/2017   HYSTEROSCOPY W/ ENDOMETRIAL ABLATION  2019   LUMBAR LAMINECTOMY/DECOMPRESSION MICRODISCECTOMY  2013   L4  --- S1   SHOULDER ARTHROSCOPY Right 08/28/2020   Procedure: ARTHROSCOPY SHOULDER lysis of adhesions and manipulation under anesthesia, extensive debridement;  Surgeon: Lori Kida, MD;  Location: [facility];  Service: Orthopedics;  Laterality: Right;   There are no problems to display for this patient.   PCP: Dr Lori King  REFERRING PROVIDER: Dr Lori King  REFERRING DIAG: pain of Lt shoulder  THERAPY DIAG:  Acute pain of left shoulder  Muscle weakness (generalized)  Other symptoms and signs involving the musculoskeletal system  Rationale for Evaluation and Treatment: Rehabilitation  ONSET DATE: 10/24/22  SUBJECTIVE:                                                                                                                                                                                      SUBJECTIVE STATEMENT: MD notes that Lori King is making  progress with L shoulder ROM. He feels the neck "is jacked up". Patient was referred to specialist for neck and will have a cortisone injection in neck on Wednesday. Patient reports that she had increased pain in neck and L arm following last PT visit.     03/15/23 RE-EVAL: Patient underwent L shoulder surgery with extensive debridement; labral repair; biceps tendon release; removal of bone spurs 03/14/23.  She has minimal pain due to remaining effects of spinal block for surgery. Bulky dressing limits movement and the effect of ice.  Remains in the sling.   Cervical MRI shows degenerative changes C5/6. MD will send order for cervical spine.   Patient reports continued pain in the L UE symptoms. She has stiffness and tightness in the L shoulder and the shoulder is tighter. She does not have the results of the MRI. She is scheduled to see the orthopedist, Dr Lori King, tomorrow 03/17/23. She does not know results of neck MRI. Lori King reports that she is taking gabapentin 3 times a day;  the muscle relaxants every 6 hours; finished a steroid dose pack; percocet as needed. She started the antiinflammatory medication this morning. She has less of the sharp pain and can tell the medication is helping. She received a steroid injection with [place]. Pain in the L neck and shoulder area continues. She has tingling and numbness in the ring and little fingers and the thumb and now in the palm of her hand on an intermittent basis. She is having difficulty getting comfortable to sleep.    EVAL: Patient reports that she was lifting her luggage out of the overhead bin when she felt pain in the Lt shoulder and has had some pain in an intermittent basis. She had an injection in Lt shoulder 12/22/22 with some improvement but shoulder is stiff and painful.  Hand dominance: Right  PERTINENT HISTORY: Rt shoulder pain 3 yrs with shoulder surgery for labrum tear and biceps tendon; adhesive capsulitis; reactive hypoglycemic disease;  fatty liver  PAIN:  Are you having pain? Yes: NPRS scale: 5-6/10 biceps; in the night  6/10  Pain location: R shoulder/arm/shoulder blade area  Pain description: sharp; stabbing pain in biceps radiating to elbow with big reaching to the side  Aggravating factors: reaching overhead to the side and behind  Relieving factors: ice; TENs unit   PRECAUTIONS: None  WEIGHT BEARING RESTRICTIONS: No  FALLS:  Has patient fallen in last 6 months? No   PATIENT GOALS:get rid of the shoulder pain and increase ROM   NEXT MD VISIT: 03/29/23 (Dr Lori King)   OBJECTIVE:   DIAGNOSTIC FINDINGS:  None available for shoulder; x-ray (-)   02/13/23 MRI Lt shoulder -  some joint space narrowing, subchondral sclerosis, subchondral cyst formation and osteophyte formation in the glenohumeral joint, indicative of osteoarthritis.  POSTURE: Patient presents with head forward posture with increased thoracic kyphosis; shoulders rounded and elevated; scapulae abducted and rotated along the thoracic spine; head of the humerus anterior in orientation.  CERVICAL ROM: 02/02/23 Flexion 43 tight pain Lt upper trap  Extension 48 tight ant/post neck R lateral flexion 36 tight L cervical  L lateral flexion 42 R rotation 71 L rotation 62 tight pain Lt cervical to upper trap    UPPER EXTREMITY ROM:   Active ROM Right eval Left eval Left  01/11/23 Left  02/02/23 Left(passive) 03/15/23 Left passive 03/29/23  AAROM  04/04/23  Shoulder flexion 160 112 130 107 pain  80 125 138  Shoulder extension 59 25 48 31 pain  NT NT   Shoulder abduction (scaption) 168 112 133 92 pain  NT 84 92  Shoulder adduction         Shoulder internal rotation T7 Thumb to waist Thumb  T12 Hand lateral hip pain  NT neutral   Shoulder external rotation (in scapular plane) 95 85 93 45 pain  NT 70 75  Elbow flexion     120 WNL   Elbow extension     0 WFL   Wrist flexion  Wrist extension         Wrist ulnar deviation         Wrist radial  deviation         Wrist pronation         Wrist supination         (Blank rows = not tested) PASSIVE ROM - supine flexion 152; scaption 147; ER 90   AAROM/PROM supine - 04/12/23: PROM ~ 140 deg flexion; 92 deg abduction in scapular plane; 70 deg ER in scapular plane    UPPER EXTREMITY MMT: strength not assessed - moves Lt UE well against gravity; limited by pain  MMT Right eval Left eval  Shoulder flexion    Shoulder extension    Shoulder abduction    Shoulder adduction    Shoulder internal rotation    Shoulder external rotation    Middle trapezius    Lower trapezius    Elbow flexion    Elbow extension    Wrist flexion    Wrist extension    Wrist ulnar deviation    Wrist radial deviation    Wrist pronation    Wrist supination    Grip strength (lbs)    (Blank rows = not tested)  JOINT MOBILITY TESTING:  Significant joint tightness Lt shoulder in all planes  02/02/23: Significant joint tightness Lt shoulder in all planes 03/15/23: NT  UPPER LIMB TENSION TEST:  Positive L with patient unable to achieve 90 deg shoulder abduction for testing. UE in ~ 60 deg abduction with elbow extension produced radicular symptoms into Lt UE   PALPATION:  Muscular tightness Lt shoulder through the upper trap; pecs; leveator; teres 02/02/23: muscular tightness Lt shoulder girdle in upper trap; pecs; leveator; rhomboids; teres 02/09/23: muscular tightness Lt cervical and upper trap area through the shoulder girdle including pecs; leveator; rhomboids; teres 02/15/23: persistent tightness L cervical and upper trap area  03/15/23: muscular tightness L shoulder girdle    [facility] Adult PT Treatment:                                                DATE: 04/14/2023 Therapeutic Exercise: Pulley  Flexion, ab/add in scaption Horizontal ab/adduction with pulley scap squeeze  Standing  Doorway 2 lower positions  Row with noodle red TB x 5 Wall slide L UE x 3  Chin tucks 5x10" Scap  squeezes Shoulder  pendulums Chest lift  Shoulder flexion stretch at counter (AAROM walk out/in) Prone  Shoulder extension 3 sec x 5 Axial extension with scap squeeze 3 sec x 5  Manual Therapy: Soft tissue mobilization and trigger point work through the cervical musculature; into L shoulder girdle  Scapular mobilization GH mobs  Prone shoulder PROM flexion; scaption; ER in scapular plane  Trigger Point Dry-Needling  Treatment instructions: Expect mild to moderate muscle soreness. S/S of pneumothorax if dry needled over a lung field, and to seek immediate medical attention should they occur. Patient verbalized understanding of these instructions and education.  Patient Consent Given: Yes Education handout provided: Previously provided Muscles treated: L infraspinatus; upper trap Electrical stimulation performed: Yes Parameters: mAmp current; intensity adjusted to patient tolerance  Treatment response/outcome: decreased palpable tightness   Modalities: Moist heat L shoulder complex; cervical   [facility] Adult PT Treatment:  DATE: 04/12/2023 Therapeutic Exercise: Pulley  Flexion, ab/add in scaption Standing  Doorway 2 lower positions  Row with noodle red TB x 5 Wall slide L UE x 3  Chin tucks 5x10" Scap  squeezes Shoulder pendulums Chest lift  Shoulder flexion stretch at counter (AAROM walk out/in) Prone  Shoulder extension 3 sec x 5 Axial extension with scap squeeze 3 sec x 5  Manual Therapy: Soft tissue mobilization and trigger point work through the cervical musculature; into L shoulder girdle  Scapular mobilization GH mobs  Supine shoulder PROM flexion; scaption; ER in scapular plane  Modalities: Moist heat L shoulder complex  Cervical moist heat   [facility] Adult PT Treatment:                                                DATE: 04/06/2023 Therapeutic Exercise: Pulley  Flexion, ab/add in scaption Standing with noodle: Doorway 2 lower positions   Row red TB x 5 Wall slide L UE x 3  Chin tucks 5x10" Scap  squeezes Shoulder pendulums Chest lift  Shoulder flexion stretch at counter (AAROM walk out/in) Prone  Shoulder extension 3 sec x 5 Axial extension with scap squeeze 3 sec x 5  Manual Therapy: Skilled palpation to assess response to DN and manual work Psychiatrist  Treatment instructions: Expect mild to moderate muscle soreness. S/S of pneumothorax if dry needled over a lung field, and to seek immediate medical attention should they occur. Patient verbalized understanding of these instructions and education.  Patient Consent Given: Yes Education handout provided: Previously provided Muscles treated: L lateral cervical; upper trap; infraspinatus   Electrical stimulation performed: yes Parameters: mAmp current intensity adjusted to patient tolerance Treatment response/outcome: decreased palpable tightness and decreased radicular symptoms L UE STM bilat, L > R,  cervical musculature; L shoulder girdle Scapular mobilization GH mobs  Seated GH mobilization with pillow case Supine shoulder PROM flexion; scaption; ER in scapular plane  Kinesotaping  Inhibition of upper trap 1 strip of tape 70% pull  Modalities: Ice packs L shoulder complex x15 Cervical moist heat x15   PATIENT EDUCATION: Education details: POC; HEP Person educated: Patient Education method: Programmer, multimedia, Demonstration, Tactile cues, Verbal cues, and Handouts Education comprehension: verbalized understanding, returned demonstration, verbal cues required, tactile cues required, and needs further education  HOME EXERCISE PROGRAM: (from treatment prior to surgery 03/14/23)  E-mailed to: [email or web address]   Access Code: [email or web address] URL: [email or web address] Date: 01/13/2023 Prepared by: Corlis Leak  Exercises - Seated Shoulder Flexion AAROM with Pulley Behind  - 2 x daily - 7 x weekly - 1 sets - 10 reps - 10 sec  hold - Seated  Shoulder Scaption AAROM with Pulley at Side  - 2 x daily - 7 x weekly - 1 sets - 10 reps - 10sec  hold - Seated Cervical Retraction  - 2 x daily - 7 x weekly - 1-2 sets - 5-10 reps - 10 sec  hold - Seated Scapular Retraction  - 2 x daily - 7 x weekly - 1-2 sets - 10 reps - 10 sec  hold - Standing Scapular Retraction  - 3 x daily - 7 x weekly - 1 sets - 10 reps - 10 sec  hold - Shoulder External Rotation and Scapular Retraction  - 3 x daily - 7 x weekly - 1 sets - 10 reps -  3-5 sec   hold - Seated Shoulder W  - 2 x daily - 7 x weekly - 1 sets - 10 reps - 3 sec  hold - Shoulder External Rotation and Scapular Retraction with Resistance  - 2 x daily - 7 x weekly - 1 sets - 10 reps - 3-5 sec  hold - Shoulder W - External Rotation with Resistance  - 2 x daily - 7 x weekly - 1-2 sets - 10 reps - 3 sec  hold - Standing Bilateral Low Shoulder Row with Anchored Resistance  - 2 x daily - 7 x weekly - 1-3 sets - 10 reps - 2-3 sec  hold - Drawing Bow  - 1 x daily - 7 x weekly - 1 sets - 10 reps - 3 sec  hold - Prone Scapular Retraction  - 2 x daily - 7 x weekly - 1 sets - 5-10 reps - 3-5 sec  hold - Cat Cow  - 2 x daily - 7 x weekly - 1 sets - 5-10 reps - 3-5 sec  hold - Cat Cow to Child's Pose  - 2 x daily - 7 x weekly - 1 sets - 3 reps - 20-30 sec  hold  ASSESSMENT:  CLINICAL IMPRESSION: Patient reports some increased pain in L shoulder/UE following last PT visit. She continues to use medications as directed and is now sleeping 2-3 hours but awakens with L shoulder pain. Patient tolerates dry needling with good response. Manual work decreases muscular tightness and patient does tolerated PROM/stretching better. She continues to have tight but empty end feel. Note some improving muscular tightness through L shoulder girdle with persistent tightness in [facility] joint with decreased mobility and stability in the L scapulothoracic range. Patient is working on her exercises and ROM consistently at home.    Re-eval:  03/15/23: patient returns post surgery 03/14/23 for L shoulder debridement; labral repair; biceps tendon release; removal of bone spurs. She presents with L UE in sling with bulky dressing L shoulder. Patient has some pain but this is minimal due to lasting effects of spinal block. She has poor posture and alignment; limited cervical and L UE mobility; limited functional activity tolerance with no lifting allowed L UE; pain on a constant basis. Patient will benefit from PT to address problems identified.    03/07/23: Patient reports continued pain which is variable in intensity. She was seen by MD and has had a cervical MRI but she does not have results and will not see MD until 03/17/23. Patient has continued radicular UE symptoms that seem cervical in nature. Shoulder ROM is decreased and patient is unable to tolerate PROM/stretching for L shoulder. We will continue with treatment as tolerated and patient schedules. She is continuing with meds and exercises as tolerated from HEP. She continues to have cervical involvement of L UE symptoms as well as shoulder pathology. She has positive ULTT and muscular tightness to palpation through the L cervical and upper trap/clavicular area. Responds favorably to intervention through the L cervical spine but decreased symptoms are temporary. Continue axial extension in supine and neural mobilization as pt tolerates. MRI shows arthritis in L shoulder but no RC pathology.   OBJECTIVE IMPAIRMENTS: Lt shoulder pain which started with lifting overhead her luggage when travelling. She has had gradually increasing pain and stiffness in the Lt shoulder. She received an injection ~ 1-2 weeks ago with some improvement. Patient presents with poor posture and alignment; limited A and PROM Lt shoulder; functional  weakness due to pain; decreased functional activities and pain on a daily basis    GOALS: Goals reviewed with patient? Yes  SHORT TERM GOALS: Target date:  04/26/2023  Independent in initial HEP  Baseline: Goal status: INITIAL  2.  Increase AROM shoulder flexion and abduction to 100-110 degrees  Baseline:  Goal status: INITIAL   LONG TERM GOALS: Target date: 06/07/2023  Decrease pain in the Lt shoulder by 75-100% allowing patient to return to normal functional activities  Baseline:  Goal status: INITIAL  2.  AROM Lt shoulder WFL's and pain free  Baseline:  Goal status: INITIAL  3.  Patient reports return to normal functional activities including lifting grandson with minimal to no increase in Lt shoulder pain  Baseline:  Goal status: INITIAL  4.  Independent in HEP  Baseline:  Goal status: INITIAL  PLAN:  PT FREQUENCY: 2x/week  PT DURATION: 12 weeks  PLANNED INTERVENTIONS: Therapeutic exercises, Therapeutic activity, Neuromuscular re-education, Patient/Family education, Self Care, Joint mobilization, Aquatic Therapy, Dry Needling, Electrical stimulation, Spinal mobilization, Cryotherapy, Moist heat, Taping, Vasopneumatic device, Ultrasound, Ionotophoresis 4mg /ml Dexamethasone, Manual therapy, and Re-evaluation  PLAN FOR NEXT SESSION: review and progress exercises; continue with postural correction and education; manual work, DN, modalities as indicated    [employer], PT 04/14/2023, 10:14 AM

## 2023-04-19 ENCOUNTER — Encounter: Payer: Self-pay | Admitting: Rehabilitative and Restorative Service Providers"

## 2023-04-19 ENCOUNTER — Ambulatory Visit: Payer: BC Managed Care – PPO | Admitting: Rehabilitative and Restorative Service Providers"

## 2023-04-19 DIAGNOSIS — M6281 Muscle weakness (generalized): Secondary | ICD-10-CM

## 2023-04-19 DIAGNOSIS — M25512 Pain in left shoulder: Secondary | ICD-10-CM

## 2023-04-19 DIAGNOSIS — R29898 Other symptoms and signs involving the musculoskeletal system: Secondary | ICD-10-CM

## 2023-04-19 DIAGNOSIS — G8929 Other chronic pain: Secondary | ICD-10-CM

## 2023-04-19 DIAGNOSIS — M25511 Pain in right shoulder: Secondary | ICD-10-CM | POA: Diagnosis not present

## 2023-04-19 NOTE — Therapy (Signed)
OUTPATIENT PHYSICAL THERAPY SHOULDER TREATMENT   Patient Name: Lori King MRN: [id number] DOB:06-May-1966, 57 y.o., female Today's Date: 04/19/2023  END OF SESSION:  PT End of Session - 04/19/23 1021     Visit Number 11    Number of Visits 24    Date for PT Re-Evaluation 06/07/23    Authorization Type [employer] [id number]    PT Start Time 1017    PT Stop Time 1105    PT Time Calculation (min) 48 min    Activity Tolerance Patient tolerated treatment well             Past Medical History:  Diagnosis Date   Adhesive capsulitis of right shoulder    Allergic rhinitis, seasonal    Anemia    Anxiety    MDD (major depressive disorder)    Wears contact lenses    Past Surgical History:  Procedure Laterality Date   BREAST BIOPSY Left 2010   x2 per pt benign   CESAREAN SECTION  X2  last one 02-28-2001   W/  BILATERAL TUBAL LIGATION w/ last c/s   COLONOSCOPY  11/01/2017   HYSTEROSCOPY W/ ENDOMETRIAL ABLATION  2019   LUMBAR LAMINECTOMY/DECOMPRESSION MICRODISCECTOMY  2013   L4  --- S1   SHOULDER ARTHROSCOPY Right 08/28/2020   Procedure: ARTHROSCOPY SHOULDER lysis of adhesions and manipulation under anesthesia, extensive debridement;  Surgeon: Yolonda Kida, MD;  Location: [facility];  Service: Orthopedics;  Laterality: Right;   There are no problems to display for this patient.   PCP: Dr Angelica Chessman  REFERRING PROVIDER: Dr Arlyce Harman  REFERRING DIAG: pain of Lt shoulder  THERAPY DIAG:  Acute pain of left shoulder  Muscle weakness (generalized)  Other symptoms and signs involving the musculoskeletal system  Chronic right shoulder pain  Rationale for Evaluation and Treatment: Rehabilitation  ONSET DATE: 10/24/22  SUBJECTIVE:                                                                                                                                                                                      SUBJECTIVE  STATEMENT: Hurting - nerve pain down her arm more so than shoulder. Has injection for cervical spine tomorrow.    03/15/23 RE-EVAL: Patient underwent L shoulder surgery with extensive debridement; labral repair; biceps tendon release; removal of bone spurs 03/14/23.  She has minimal pain due to remaining effects of spinal block for surgery. Bulky dressing limits movement and the effect of ice. Remains in the sling.   Cervical MRI shows degenerative changes C5/6. MD will send order for cervical spine.   Patient reports continued pain in the L UE symptoms.  She has stiffness and tightness in the L shoulder and the shoulder is tighter. She does not have the results of the MRI. She is scheduled to see the orthopedist, Dr Aundria Rud, tomorrow 03/17/23. She does not know results of neck MRI. [name] reports that she is taking gabapentin 3 times a day;  the muscle relaxants every 6 hours; finished a steroid dose pack; percocet as needed. She started the antiinflammatory medication this morning. She has less of the sharp pain and can tell the medication is helping. She received a steroid injection with [place]. Pain in the L neck and shoulder area continues. She has tingling and numbness in the ring and little fingers and the thumb and now in the palm of her hand on an intermittent basis. She is having difficulty getting comfortable to sleep.    EVAL: Patient reports that she was lifting her luggage out of the overhead bin when she felt pain in the Lt shoulder and has had some pain in an intermittent basis. She had an injection in Lt shoulder 12/22/22 with some improvement but shoulder is stiff and painful.  Hand dominance: Right  PERTINENT HISTORY: Rt shoulder pain 3 yrs with shoulder surgery for labrum tear and biceps tendon; adhesive capsulitis; reactive hypoglycemic disease; fatty liver  PAIN:  Are you having pain? Yes: NPRS scale: 5-6/10 biceps; in the night  6/10  Pain location: R shoulder/arm/shoulder blade area   Pain description: sharp; stabbing pain in biceps radiating to elbow with big reaching to the side  Aggravating factors: reaching overhead to the side and behind  Relieving factors: ice; TENs unit   PRECAUTIONS: None  WEIGHT BEARING RESTRICTIONS: No  FALLS:  Has patient fallen in last 6 months? No   PATIENT GOALS:get rid of the shoulder pain and increase ROM   NEXT MD VISIT: 03/29/23 (Dr Aundria Rud)   OBJECTIVE:   DIAGNOSTIC FINDINGS:  None available for shoulder; x-ray (-)   02/13/23 MRI Lt shoulder -  some joint space narrowing, subchondral sclerosis, subchondral cyst formation and osteophyte formation in the glenohumeral joint, indicative of osteoarthritis.  POSTURE: Patient presents with head forward posture with increased thoracic kyphosis; shoulders rounded and elevated; scapulae abducted and rotated along the thoracic spine; head of the humerus anterior in orientation.  CERVICAL ROM: 02/02/23 Flexion 43 tight pain Lt upper trap  Extension 48 tight ant/post neck R lateral flexion 36 tight L cervical  L lateral flexion 42 R rotation 71 L rotation 62 tight pain Lt cervical to upper trap    UPPER EXTREMITY ROM:   Active ROM Right eval Left eval Left  01/11/23 Left  02/02/23 Left(passive) 03/15/23 Left passive 03/29/23  AAROM  04/04/23  Shoulder flexion 160 112 130 107 pain  80 125 138  Shoulder extension 59 25 48 31 pain  NT NT   Shoulder abduction (scaption) 168 112 133 92 pain  NT 84 92  Shoulder adduction         Shoulder internal rotation T7 Thumb to waist Thumb  T12 Hand lateral hip pain  NT neutral   Shoulder external rotation (in scapular plane) 95 85 93 45 pain  NT 70 75  Elbow flexion     120 WNL   Elbow extension     0 WFL   Wrist flexion         Wrist extension         Wrist ulnar deviation         Wrist radial deviation  Wrist pronation         Wrist supination         (Blank rows = not tested) PASSIVE ROM - supine flexion 152; scaption 147; ER 90    AAROM/PROM supine - 04/19/23: PROM ~ 140 deg flexion; 92 deg abduction in scapular plane; 70 deg ER in scapular plane    UPPER EXTREMITY MMT: strength not assessed - moves Lt UE well against gravity; limited by pain  MMT Right eval Left eval  Shoulder flexion    Shoulder extension    Shoulder abduction    Shoulder adduction    Shoulder internal rotation    Shoulder external rotation    Middle trapezius    Lower trapezius    Elbow flexion    Elbow extension    Wrist flexion    Wrist extension    Wrist ulnar deviation    Wrist radial deviation    Wrist pronation    Wrist supination    Grip strength (lbs)    (Blank rows = not tested)  JOINT MOBILITY TESTING:  Significant joint tightness Lt shoulder in all planes  02/02/23: Significant joint tightness Lt shoulder in all planes 03/15/23: NT  UPPER LIMB TENSION TEST:  Positive L with patient unable to achieve 90 deg shoulder abduction for testing. UE in ~ 60 deg abduction with elbow extension produced radicular symptoms into Lt UE   PALPATION:  Muscular tightness Lt shoulder through the upper trap; pecs; leveator; teres 02/02/23: muscular tightness Lt shoulder girdle in upper trap; pecs; leveator; rhomboids; teres 02/09/23: muscular tightness Lt cervical and upper trap area through the shoulder girdle including pecs; leveator; rhomboids; teres 02/15/23: persistent tightness L cervical and upper trap area  03/15/23: muscular tightness L shoulder girdle    [facility] Adult PT Treatment:                                                DATE: 04/19/2023 Therapeutic Exercise: Pulley  Flexion, ab/add in scaption Horizontal ab/adduction with pulley scap squeeze  Standing  Doorway 2 lower positions  Row with noodle red TB x 5 Wall slide L UE x 3  Chin tucks 5x10" Scap  squeezes Shoulder pendulums Chest lift  Shoulder flexion stretch at counter (AAROM walk out/in) Prone  Shoulder extension 3 sec x 5 Axial extension with scap squeeze 3  sec x 5  Manual Therapy: Soft tissue mobilization and trigger point work through the cervical musculature; into L shoulder girdle  Scapular mobilization GH mobs  Prone shoulder PROM flexion; scaption; ER in scapular plane  Modalities: Moist heat L shoulder complex; cervical   [facility] Adult PT Treatment:                                                DATE: 04/14/2023 Therapeutic Exercise: Pulley  Flexion, ab/add in scaption Horizontal ab/adduction with pulley scap squeeze  Standing  Doorway 2 lower positions  Row with noodle red TB x 5 Wall slide L UE x 3  Chin tucks 5x10" Scap  squeezes Shoulder pendulums Chest lift  Shoulder flexion stretch at counter (AAROM walk out/in) Prone  Shoulder extension 3 sec x 5 Axial extension with scap squeeze 3 sec x 5  Manual  Therapy: Soft tissue mobilization and trigger point work through the cervical musculature; into L shoulder girdle  Scapular mobilization GH mobs  Prone shoulder PROM flexion; scaption; ER in scapular plane  Trigger Point Dry-Needling  Treatment instructions: Expect mild to moderate muscle soreness. S/S of pneumothorax if dry needled over a lung field, and to seek immediate medical attention should they occur. Patient verbalized understanding of these instructions and education.  Patient Consent Given: Yes Education handout provided: Previously provided Muscles treated: L infraspinatus; upper trap Electrical stimulation performed: Yes Parameters: mAmp current; intensity adjusted to patient tolerance  Treatment response/outcome: decreased palpable tightness   Modalities: Moist heat L shoulder complex; cervical   [facility] Adult PT Treatment:                                                DATE: 04/12/2023 Therapeutic Exercise: Pulley  Flexion, ab/add in scaption Standing  Doorway 2 lower positions  Row with noodle red TB x 5 Wall slide L UE x 3  Chin tucks 5x10" Scap  squeezes Shoulder pendulums Chest lift  Shoulder  flexion stretch at counter (AAROM walk out/in) Prone  Shoulder extension 3 sec x 5 Axial extension with scap squeeze 3 sec x 5  Manual Therapy: Soft tissue mobilization and trigger point work through the cervical musculature; into L shoulder girdle  Scapular mobilization GH mobs  Supine shoulder PROM flexion; scaption; ER in scapular plane  Modalities: Moist heat L shoulder complex  Cervical moist heat   [facility] Adult PT Treatment:                                                DATE: 04/06/2023 Therapeutic Exercise: Pulley  Flexion, ab/add in scaption Standing with noodle: Doorway 2 lower positions  Row red TB x 5 Wall slide L UE x 3  Chin tucks 5x10" Scap  squeezes Shoulder pendulums Chest lift  Shoulder flexion stretch at counter (AAROM walk out/in) Prone  Shoulder extension 3 sec x 5 Axial extension with scap squeeze 3 sec x 5  Manual Therapy: Skilled palpation to assess response to DN and manual work Psychiatrist  Treatment instructions: Expect mild to moderate muscle soreness. S/S of pneumothorax if dry needled over a lung field, and to seek immediate medical attention should they occur. Patient verbalized understanding of these instructions and education.  Patient Consent Given: Yes Education handout provided: Previously provided Muscles treated: L lateral cervical; upper trap; infraspinatus   Electrical stimulation performed: yes Parameters: mAmp current intensity adjusted to patient tolerance Treatment response/outcome: decreased palpable tightness and decreased radicular symptoms L UE STM bilat, L > R,  cervical musculature; L shoulder girdle Scapular mobilization GH mobs  Seated GH mobilization with pillow case Supine shoulder PROM flexion; scaption; ER in scapular plane  Kinesotaping  Inhibition of upper trap 1 strip of tape 70% pull  Modalities: Ice packs L shoulder complex x15 Cervical moist heat x15   PATIENT EDUCATION: Education details:  POC; HEP Person educated: Patient Education method: Programmer, multimedia, Demonstration, Tactile cues, Verbal cues, and Handouts Education comprehension: verbalized understanding, returned demonstration, verbal cues required, tactile cues required, and needs further education  HOME EXERCISE PROGRAM: (from treatment prior to surgery 03/14/23)  E-mailed to:  [email or web address]   Access Code: [email or web address] URL: [email or web address] Date: 01/13/2023 Prepared by: Corlis Leak  Exercises - Seated Shoulder Flexion AAROM with Pulley Behind  - 2 x daily - 7 x weekly - 1 sets - 10 reps - 10 sec  hold - Seated Shoulder Scaption AAROM with Pulley at Side  - 2 x daily - 7 x weekly - 1 sets - 10 reps - 10sec  hold - Seated Cervical Retraction  - 2 x daily - 7 x weekly - 1-2 sets - 5-10 reps - 10 sec  hold - Seated Scapular Retraction  - 2 x daily - 7 x weekly - 1-2 sets - 10 reps - 10 sec  hold - Standing Scapular Retraction  - 3 x daily - 7 x weekly - 1 sets - 10 reps - 10 sec  hold - Shoulder External Rotation and Scapular Retraction  - 3 x daily - 7 x weekly - 1 sets - 10 reps - 3-5 sec   hold - Seated Shoulder W  - 2 x daily - 7 x weekly - 1 sets - 10 reps - 3 sec  hold - Shoulder External Rotation and Scapular Retraction with Resistance  - 2 x daily - 7 x weekly - 1 sets - 10 reps - 3-5 sec  hold - Shoulder W - External Rotation with Resistance  - 2 x daily - 7 x weekly - 1-2 sets - 10 reps - 3 sec  hold - Standing Bilateral Low Shoulder Row with Anchored Resistance  - 2 x daily - 7 x weekly - 1-3 sets - 10 reps - 2-3 sec  hold - Drawing Bow  - 1 x daily - 7 x weekly - 1 sets - 10 reps - 3 sec  hold - Prone Scapular Retraction  - 2 x daily - 7 x weekly - 1 sets - 5-10 reps - 3-5 sec  hold - Cat Cow  - 2 x daily - 7 x weekly - 1 sets - 5-10 reps - 3-5 sec  hold - Cat Cow to Child's Pose  - 2 x daily - 7 x weekly - 1 sets - 3 reps - 20-30 sec  hold  ASSESSMENT:  CLINICAL IMPRESSION: Patient reports  persistent pain in L shoulder/UE. She continues to use medications as directed and is now sleeping 2-3 hours but awakens with L shoulder pain. Manual work decreases muscular tightness and patient does tolerated PROM/stretching better. She continues to have tight but empty end feel. Note some improving muscular tightness through L shoulder girdle with persistent tightness in [facility] joint with decreased mobility and stability in the L scapulothoracic range. Patient is working on her exercises and ROM consistently at home.    Re-eval: 03/15/23: patient returns post surgery 03/14/23 for L shoulder debridement; labral repair; biceps tendon release; removal of bone spurs. She presents with L UE in sling with bulky dressing L shoulder. Patient has some pain but this is minimal due to lasting effects of spinal block. She has poor posture and alignment; limited cervical and L UE mobility; limited functional activity tolerance with no lifting allowed L UE; pain on a constant basis. Patient will benefit from PT to address problems identified.    03/07/23: Patient reports continued pain which is variable in intensity. She was seen by MD and has had a cervical MRI but she does not have results and will not see MD until 03/17/23. Patient has continued radicular UE symptoms that seem cervical in nature.  Shoulder ROM is decreased and patient is unable to tolerate PROM/stretching for L shoulder. We will continue with treatment as tolerated and patient schedules. She is continuing with meds and exercises as tolerated from HEP. She continues to have cervical involvement of L UE symptoms as well as shoulder pathology. She has positive ULTT and muscular tightness to palpation through the L cervical and upper trap/clavicular area. Responds favorably to intervention through the L cervical spine but decreased symptoms are temporary. Continue axial extension in supine and neural mobilization as pt tolerates. MRI shows arthritis in L shoulder  but no RC pathology.   OBJECTIVE IMPAIRMENTS: Lt shoulder pain which started with lifting overhead her luggage when travelling. She has had gradually increasing pain and stiffness in the Lt shoulder. She received an injection ~ 1-2 weeks ago with some improvement. Patient presents with poor posture and alignment; limited A and PROM Lt shoulder; functional weakness due to pain; decreased functional activities and pain on a daily basis    GOALS: Goals reviewed with patient? Yes  SHORT TERM GOALS: Target date: 04/26/2023  Independent in initial HEP  Baseline: Goal status: INITIAL  2.  Increase AROM shoulder flexion and abduction to 100-110 degrees  Baseline:  Goal status: INITIAL   LONG TERM GOALS: Target date: 06/07/2023  Decrease pain in the Lt shoulder by 75-100% allowing patient to return to normal functional activities  Baseline:  Goal status: INITIAL  2.  AROM Lt shoulder WFL's and pain free  Baseline:  Goal status: INITIAL  3.  Patient reports return to normal functional activities including lifting grandson with minimal to no increase in Lt shoulder pain  Baseline:  Goal status: INITIAL  4.  Independent in HEP  Baseline:  Goal status: INITIAL  PLAN:  PT FREQUENCY: 2x/week  PT DURATION: 12 weeks  PLANNED INTERVENTIONS: Therapeutic exercises, Therapeutic activity, Neuromuscular re-education, Patient/Family education, Self Care, Joint mobilization, Aquatic Therapy, Dry Needling, Electrical stimulation, Spinal mobilization, Cryotherapy, Moist heat, Taping, Vasopneumatic device, Ultrasound, Ionotophoresis 4mg /ml Dexamethasone, Manual therapy, and Re-evaluation  PLAN FOR NEXT SESSION: review and progress exercises; continue with postural correction and education; manual work, DN, modalities as indicated    [employer], PT 04/19/2023, 10:21 AM

## 2023-04-20 DIAGNOSIS — M5412 Radiculopathy, cervical region: Secondary | ICD-10-CM | POA: Diagnosis not present

## 2023-04-21 ENCOUNTER — Encounter: Payer: Self-pay | Admitting: Rehabilitative and Restorative Service Providers"

## 2023-04-21 ENCOUNTER — Ambulatory Visit: Payer: BC Managed Care – PPO | Admitting: Rehabilitative and Restorative Service Providers"

## 2023-04-21 DIAGNOSIS — G8929 Other chronic pain: Secondary | ICD-10-CM | POA: Diagnosis not present

## 2023-04-21 DIAGNOSIS — M6281 Muscle weakness (generalized): Secondary | ICD-10-CM | POA: Diagnosis not present

## 2023-04-21 DIAGNOSIS — M25512 Pain in left shoulder: Secondary | ICD-10-CM | POA: Diagnosis not present

## 2023-04-21 DIAGNOSIS — R29898 Other symptoms and signs involving the musculoskeletal system: Secondary | ICD-10-CM

## 2023-04-21 DIAGNOSIS — M25511 Pain in right shoulder: Secondary | ICD-10-CM | POA: Diagnosis not present

## 2023-04-21 NOTE — Therapy (Signed)
OUTPATIENT PHYSICAL THERAPY SHOULDER TREATMENT   Patient Name: Lori King MRN: [id number] DOB:[birth year], 57 y.o., female Today's Date: 04/21/2023  END OF SESSION:  PT End of Session - 04/21/23 0927     Visit Number 12    Number of Visits 24    Date for PT Re-Evaluation 06/07/23    Authorization Type [employer] [id number]    PT Start Time 0926    PT Stop Time 1016    PT Time Calculation (min) 50 min    Activity Tolerance Patient tolerated treatment well             Past Medical History:  Diagnosis Date   Adhesive capsulitis of right shoulder    Allergic rhinitis, seasonal    Anemia    Anxiety    MDD (major depressive disorder)    Wears contact lenses    Past Surgical History:  Procedure Laterality Date   BREAST BIOPSY Left 2010   x2 per pt benign   CESAREAN SECTION  X2  last one 02-28-2001   W/  BILATERAL TUBAL LIGATION w/ last c/s   COLONOSCOPY  11/01/2017   HYSTEROSCOPY W/ ENDOMETRIAL ABLATION  2019   LUMBAR LAMINECTOMY/DECOMPRESSION MICRODISCECTOMY  2013   L4  --- S1   SHOULDER ARTHROSCOPY Right 08/28/2020   Procedure: ARTHROSCOPY SHOULDER lysis of adhesions and manipulation under anesthesia, extensive debridement;  Surgeon: Yolonda Kida, MD;  Location: [facility];  Service: Orthopedics;  Laterality: Right;   There are no problems to display for this patient.   PCP: Dr Angelica Chessman  REFERRING PROVIDER: Dr Arlyce Harman  REFERRING DIAG: pain of Lt shoulder  THERAPY DIAG:  Acute pain of left shoulder  Muscle weakness (generalized)  Other symptoms and signs involving the musculoskeletal system  Chronic right shoulder pain  Rationale for Evaluation and Treatment: Rehabilitation  ONSET DATE: 10/24/22  SUBJECTIVE:                                                                                                                                                                                      SUBJECTIVE  STATEMENT: Patient had ESI for L cervical spine. Less pain and tingling in the L UE and less pain in the L shoulder.    03/15/23 RE-EVAL: Patient underwent L shoulder surgery with extensive debridement; labral repair; biceps tendon release; removal of bone spurs 03/14/23.  She has minimal pain due to remaining effects of spinal block for surgery. Bulky dressing limits movement and the effect of ice. Remains in the sling.   Cervical MRI shows degenerative changes C5/6. MD will send order for cervical spine.   Patient reports continued pain  in the L UE symptoms. She has stiffness and tightness in the L shoulder and the shoulder is tighter. She does not have the results of the MRI. She is scheduled to see the orthopedist, Dr Aundria Rud, tomorrow 03/17/23. She does not know results of neck MRI. [name] reports that she is taking gabapentin 3 times a day;  the muscle relaxants every 6 hours; finished a steroid dose pack; percocet as needed. She started the antiinflammatory medication this morning. She has less of the sharp pain and can tell the medication is helping. She received a steroid injection with [place]. Pain in the L neck and shoulder area continues. She has tingling and numbness in the ring and little fingers and the thumb and now in the palm of her hand on an intermittent basis. She is having difficulty getting comfortable to sleep.    EVAL: Patient reports that she was lifting her luggage out of the overhead bin when she felt pain in the Lt shoulder and has had some pain in an intermittent basis. She had an injection in Lt shoulder 12/22/22 with some improvement but shoulder is stiff and painful.  Hand dominance: Right  PERTINENT HISTORY: Rt shoulder pain 3 yrs with shoulder surgery for labrum tear and biceps tendon; adhesive capsulitis; reactive hypoglycemic disease; fatty liver  PAIN:  Are you having pain? Yes: NPRS scale: 3/10 biceps; in the night  6/10  Pain location: R shoulder/arm/shoulder blade  area  Pain description: sharp; stabbing pain in biceps radiating to elbow with big reaching to the side  Aggravating factors: reaching overhead to the side and behind  Relieving factors: ice; TENs unit   PRECAUTIONS: None  WEIGHT BEARING RESTRICTIONS: No  FALLS:  Has patient fallen in last 6 months? No   PATIENT GOALS:get rid of the shoulder pain and increase ROM   NEXT MD VISIT: 03/29/23 (Dr Aundria Rud)   OBJECTIVE:   DIAGNOSTIC FINDINGS:  None available for shoulder; x-ray (-)   02/13/23 MRI Lt shoulder -  some joint space narrowing, subchondral sclerosis, subchondral cyst formation and osteophyte formation in the glenohumeral joint, indicative of osteoarthritis.  POSTURE: Patient presents with head forward posture with increased thoracic kyphosis; shoulders rounded and elevated; scapulae abducted and rotated along the thoracic spine; head of the humerus anterior in orientation.  CERVICAL ROM: 02/02/23 Flexion 43 tight pain Lt upper trap  Extension 48 tight ant/post neck R lateral flexion 36 tight L cervical  L lateral flexion 42 R rotation 71 L rotation 62 tight pain Lt cervical to upper trap    UPPER EXTREMITY ROM:   Active ROM Right eval Left eval Left  01/11/23 Left  02/02/23 Left(passive) 03/15/23 Left passive 03/29/23  AAROM  04/04/23  Shoulder flexion 160 112 130 107 pain  80 125 138  Shoulder extension 59 25 48 31 pain  NT NT   Shoulder abduction (scaption) 168 112 133 92 pain  NT 84 92  Shoulder adduction         Shoulder internal rotation T7 Thumb to waist Thumb  T12 Hand lateral hip pain  NT neutral   Shoulder external rotation (in scapular plane) 95 85 93 45 pain  NT 70 75  Elbow flexion     120 WNL   Elbow extension     0 WFL   Wrist flexion         Wrist extension         Wrist ulnar deviation         Wrist radial  deviation         Wrist pronation         Wrist supination         (Blank rows = not tested) PASSIVE ROM - supine flexion 152; scaption 147; ER  90   AAROM/PROM supine - 04/19/23: PROM ~ 140 deg flexion; 92 deg abduction in scapular plane; 70 deg ER in scapular plane    UPPER EXTREMITY MMT: strength not assessed - moves Lt UE well against gravity; limited by pain  MMT Right eval Left eval  Shoulder flexion    Shoulder extension    Shoulder abduction    Shoulder adduction    Shoulder internal rotation    Shoulder external rotation    Middle trapezius    Lower trapezius    Elbow flexion    Elbow extension    Wrist flexion    Wrist extension    Wrist ulnar deviation    Wrist radial deviation    Wrist pronation    Wrist supination    Grip strength (lbs)    (Blank rows = not tested)  JOINT MOBILITY TESTING:  Significant joint tightness Lt shoulder in all planes  02/02/23: Significant joint tightness Lt shoulder in all planes 03/15/23: NT  UPPER LIMB TENSION TEST:  Positive L with patient unable to achieve 90 deg shoulder abduction for testing. UE in ~ 60 deg abduction with elbow extension produced radicular symptoms into Lt UE   PALPATION:  Muscular tightness Lt shoulder through the upper trap; pecs; leveator; teres 02/02/23: muscular tightness Lt shoulder girdle in upper trap; pecs; leveator; rhomboids; teres 02/09/23: muscular tightness Lt cervical and upper trap area through the shoulder girdle including pecs; leveator; rhomboids; teres 02/15/23: persistent tightness L cervical and upper trap area  03/15/23: muscular tightness L shoulder girdle    [facility] Adult PT Treatment:                                                DATE: 04/21/2023 Therapeutic Exercise: Pulley  Flexion, ab/add in scaption Horizontal ab/adduction with pulley scap squeeze  Standing  Scap  squeezes Shoulder pendulums Chest lift  Shoulder flexion stretch at counter (AAROM walk out/in) Sitting  Initiation of deltoid 3 sec x 10  Prone  Shoulder extension 3 sec x 5 Axial extension with scap squeeze 3 sec x 5  Manual Therapy: Soft tissue  mobilization and trigger point work through the cervical musculature; into L shoulder girdle  Scapular mobilization GH mobs  Prone shoulder PROM flexion; scaption; ER in scapular plane  Modalities: Moist heat L shoulder complex; cervical   [facility] Adult PT Treatment:                                                DATE: 04/19/2023 Therapeutic Exercise: Pulley  Flexion, ab/add in scaption Horizontal ab/adduction with pulley scap squeeze  Standing  Doorway 2 lower positions  Row with noodle red TB x 5 Wall slide L UE x 3  Chin tucks 5x10" Scap  squeezes Shoulder pendulums Chest lift  Shoulder flexion stretch at counter (AAROM walk out/in) Prone  Shoulder extension 3 sec x 5 Axial extension with scap squeeze 3 sec x 5  Manual Therapy: Soft tissue  mobilization and trigger point work through the cervical musculature; into L shoulder girdle  Scapular mobilization GH mobs  Prone shoulder PROM flexion; scaption; ER in scapular plane  Modalities: Moist heat L shoulder complex; cervical   [facility] Adult PT Treatment:                                                DATE: 04/14/2023 Therapeutic Exercise: Pulley  Flexion, ab/add in scaption Horizontal ab/adduction with pulley scap squeeze  Standing  Doorway 2 lower positions  Row with noodle red TB x 5 Wall slide L UE x 3  Chin tucks 5x10" Scap  squeezes Shoulder pendulums Chest lift  Shoulder flexion stretch at counter (AAROM walk out/in) Prone  Shoulder extension 3 sec x 5 Axial extension with scap squeeze 3 sec x 5  Manual Therapy: Soft tissue mobilization and trigger point work through the cervical musculature; into L shoulder girdle  Scapular mobilization GH mobs  Prone shoulder PROM flexion; scaption; ER in scapular plane  Trigger Point Dry-Needling  Treatment instructions: Expect mild to moderate muscle soreness. S/S of pneumothorax if dry needled over a lung field, and to seek immediate medical attention should they occur.  Patient verbalized understanding of these instructions and education.  Patient Consent Given: Yes Education handout provided: Previously provided Muscles treated: L infraspinatus; upper trap Electrical stimulation performed: Yes Parameters: mAmp current; intensity adjusted to patient tolerance  Treatment response/outcome: decreased palpable tightness   Modalities: Moist heat L shoulder complex; cervical    PATIENT EDUCATION: Education details: POC; HEP Person educated: Patient Education method: Programmer, multimedia, Demonstration, Tactile cues, Verbal cues, and Handouts Education comprehension: verbalized understanding, returned demonstration, verbal cues required, tactile cues required, and needs further education  HOME EXERCISE PROGRAM: (from treatment prior to surgery 03/14/23)  E-mailed to: [email or web address]   Access Code: [email or web address] URL: [email or web address] Date: 01/13/2023 Prepared by: Corlis Leak  Exercises - Seated Shoulder Flexion AAROM with Pulley Behind  - 2 x daily - 7 x weekly - 1 sets - 10 reps - 10 sec  hold - Seated Shoulder Scaption AAROM with Pulley at Side  - 2 x daily - 7 x weekly - 1 sets - 10 reps - 10sec  hold - Seated Cervical Retraction  - 2 x daily - 7 x weekly - 1-2 sets - 5-10 reps - 10 sec  hold - Seated Scapular Retraction  - 2 x daily - 7 x weekly - 1-2 sets - 10 reps - 10 sec  hold - Standing Scapular Retraction  - 3 x daily - 7 x weekly - 1 sets - 10 reps - 10 sec  hold - Shoulder External Rotation and Scapular Retraction  - 3 x daily - 7 x weekly - 1 sets - 10 reps - 3-5 sec   hold - Seated Shoulder W  - 2 x daily - 7 x weekly - 1 sets - 10 reps - 3 sec  hold - Shoulder External Rotation and Scapular Retraction with Resistance  - 2 x daily - 7 x weekly - 1 sets - 10 reps - 3-5 sec  hold - Shoulder W - External Rotation with Resistance  - 2 x daily - 7 x weekly - 1-2 sets - 10 reps - 3 sec  hold - Standing Bilateral Low Shoulder Row with  Anchored Resistance  - 2 x daily -  7 x weekly - 1-3 sets - 10 reps - 2-3 sec  hold - Drawing Bow  - 1 x daily - 7 x weekly - 1 sets - 10 reps - 3 sec  hold - Prone Scapular Retraction  - 2 x daily - 7 x weekly - 1 sets - 5-10 reps - 3-5 sec  hold - Cat Cow  - 2 x daily - 7 x weekly - 1 sets - 5-10 reps - 3-5 sec  hold - Cat Cow to Child's Pose  - 2 x daily - 7 x weekly - 1 sets - 3 reps - 20-30 sec  hold  ASSESSMENT:  CLINICAL IMPRESSION: Patient reports decreasing pain in L shoulder/UE. ESI went well yesterday and she can see improvement in the L UE symptoms. Manual work decreases muscular tightness and patient does tolerated PROM/stretching better. She continues to have tight but empty end feel. Note some improving muscular tightness through L shoulder girdle with persistent tightness in [facility] joint with decreased mobility and stability in the L scapulothoracic range. Patient is working on her exercises and ROM at home.    Re-eval: 03/15/23: patient returns post surgery 03/14/23 for L shoulder debridement; labral repair; biceps tendon release; removal of bone spurs. She presents with L UE in sling with bulky dressing L shoulder. Patient has some pain but this is minimal due to lasting effects of spinal block. She has poor posture and alignment; limited cervical and L UE mobility; limited functional activity tolerance with no lifting allowed L UE; pain on a constant basis. Patient will benefit from PT to address problems identified.    03/07/23: Patient reports continued pain which is variable in intensity. She was seen by MD and has had a cervical MRI but she does not have results and will not see MD until 03/17/23. Patient has continued radicular UE symptoms that seem cervical in nature. Shoulder ROM is decreased and patient is unable to tolerate PROM/stretching for L shoulder. We will continue with treatment as tolerated and patient schedules. She is continuing with meds and exercises as tolerated from  HEP. She continues to have cervical involvement of L UE symptoms as well as shoulder pathology. She has positive ULTT and muscular tightness to palpation through the L cervical and upper trap/clavicular area. Responds favorably to intervention through the L cervical spine but decreased symptoms are temporary. Continue axial extension in supine and neural mobilization as pt tolerates. MRI shows arthritis in L shoulder but no RC pathology.   OBJECTIVE IMPAIRMENTS: Lt shoulder pain which started with lifting overhead her luggage when travelling. She has had gradually increasing pain and stiffness in the Lt shoulder. She received an injection ~ 1-2 weeks ago with some improvement. Patient presents with poor posture and alignment; limited A and PROM Lt shoulder; functional weakness due to pain; decreased functional activities and pain on a daily basis    GOALS: Goals reviewed with patient? Yes  SHORT TERM GOALS: Target date: 04/26/2023  Independent in initial HEP  Baseline: Goal status: INITIAL  2.  Increase AROM shoulder flexion and abduction to 100-110 degrees  Baseline:  Goal status: INITIAL   LONG TERM GOALS: Target date: 06/07/2023  Decrease pain in the Lt shoulder by 75-100% allowing patient to return to normal functional activities  Baseline:  Goal status: INITIAL  2.  AROM Lt shoulder WFL's and pain free  Baseline:  Goal status: INITIAL  3.  Patient reports return to normal functional activities including lifting grandson with minimal  to no increase in Lt shoulder pain  Baseline:  Goal status: INITIAL  4.  Independent in HEP  Baseline:  Goal status: INITIAL  PLAN:  PT FREQUENCY: 2x/week  PT DURATION: 12 weeks  PLANNED INTERVENTIONS: Therapeutic exercises, Therapeutic activity, Neuromuscular re-education, Patient/Family education, Self Care, Joint mobilization, Aquatic Therapy, Dry Needling, Electrical stimulation, Spinal mobilization, Cryotherapy, Moist heat, Taping,  Vasopneumatic device, Ultrasound, Ionotophoresis 4mg /ml Dexamethasone, Manual therapy, and Re-evaluation  PLAN FOR NEXT SESSION: review and progress exercises; continue with postural correction and education; manual work, DN, modalities as indicated    [employer], PT 04/21/2023, 9:27 AM

## 2023-04-25 ENCOUNTER — Ambulatory Visit: Payer: BC Managed Care – PPO | Admitting: Rehabilitative and Restorative Service Providers"

## 2023-04-25 ENCOUNTER — Encounter: Payer: Self-pay | Admitting: Rehabilitative and Restorative Service Providers"

## 2023-04-25 DIAGNOSIS — M6281 Muscle weakness (generalized): Secondary | ICD-10-CM

## 2023-04-25 DIAGNOSIS — R29898 Other symptoms and signs involving the musculoskeletal system: Secondary | ICD-10-CM | POA: Diagnosis not present

## 2023-04-25 DIAGNOSIS — M25512 Pain in left shoulder: Secondary | ICD-10-CM | POA: Diagnosis not present

## 2023-04-25 DIAGNOSIS — G8929 Other chronic pain: Secondary | ICD-10-CM

## 2023-04-25 DIAGNOSIS — M25511 Pain in right shoulder: Secondary | ICD-10-CM | POA: Diagnosis not present

## 2023-04-25 NOTE — Therapy (Signed)
OUTPATIENT PHYSICAL THERAPY SHOULDER TREATMENT   Patient Name: Lori King MRN: [id number] DOB:1965/09/13, 57 y.o., female Today's Date: 04/25/2023  END OF SESSION:  PT End of Session - 04/25/23 1709     Visit Number 13    Number of Visits 24    Date for PT Re-Evaluation 06/07/23    Authorization Type [employer] [id number]    PT Start Time 1400    PT Stop Time 1451    PT Time Calculation (min) 51 min    Activity Tolerance Patient tolerated treatment well             Past Medical History:  Diagnosis Date   Adhesive capsulitis of right shoulder    Allergic rhinitis, seasonal    Anemia    Anxiety    MDD (major depressive disorder)    Wears contact lenses    Past Surgical History:  Procedure Laterality Date   BREAST BIOPSY Left 2010   x2 per pt benign   CESAREAN SECTION  X2  last one 02-28-2001   W/  BILATERAL TUBAL LIGATION w/ last c/s   COLONOSCOPY  11/01/2017   HYSTEROSCOPY W/ ENDOMETRIAL ABLATION  2019   LUMBAR LAMINECTOMY/DECOMPRESSION MICRODISCECTOMY  2013   L4  --- S1   SHOULDER ARTHROSCOPY Right 08/28/2020   Procedure: ARTHROSCOPY SHOULDER lysis of adhesions and manipulation under anesthesia, extensive debridement;  Surgeon: Yolonda Kida, MD;  Location: [facility];  Service: Orthopedics;  Laterality: Right;   There are no problems to display for this patient.   PCP: Dr Angelica Chessman  REFERRING PROVIDER: Dr Arlyce Harman  REFERRING DIAG: pain of Lt shoulder  THERAPY DIAG:  Acute pain of left shoulder  Muscle weakness (generalized)  Other symptoms and signs involving the musculoskeletal system  Chronic right shoulder pain  Rationale for Evaluation and Treatment: Rehabilitation  ONSET DATE: 10/24/22  SUBJECTIVE:                                                                                                                                                                                      SUBJECTIVE  STATEMENT: Patient reports increased pain since last Friday. She has pain in the L posterior neck area but no radiating pain in the L UE. Shoulder feels tight. Patient had ESI for L cervical spine last week with good results including less pain and tingling in the L UE and less pain in the L shoulder.    03/15/23 RE-EVAL: Patient underwent L shoulder surgery with extensive debridement; labral repair; biceps tendon release; removal of bone spurs 03/14/23.  She has minimal pain due to remaining effects of spinal block for surgery. Bulky  dressing limits movement and the effect of ice. Remains in the sling.   Cervical MRI shows degenerative changes C5/6. MD will send order for cervical spine.   Patient reports continued pain in the L UE symptoms. She has stiffness and tightness in the L shoulder and the shoulder is tighter. She does not have the results of the MRI. She is scheduled to see the orthopedist, Dr Aundria Rud, tomorrow 03/17/23. She does not know results of neck MRI. [name] reports that she is taking gabapentin 3 times a day;  the muscle relaxants every 6 hours; finished a steroid dose pack; percocet as needed. She started the antiinflammatory medication this morning. She has less of the sharp pain and can tell the medication is helping. She received a steroid injection with [place]. Pain in the L neck and shoulder area continues. She has tingling and numbness in the ring and little fingers and the thumb and now in the palm of her hand on an intermittent basis. She is having difficulty getting comfortable to sleep.    EVAL: Patient reports that she was lifting her luggage out of the overhead bin when she felt pain in the Lt shoulder and has had some pain in an intermittent basis. She had an injection in Lt shoulder 12/22/22 with some improvement but shoulder is stiff and painful.  Hand dominance: Right  PERTINENT HISTORY: Rt shoulder pain 3 yrs with shoulder surgery for labrum tear and biceps tendon;  adhesive capsulitis; reactive hypoglycemic disease; fatty liver  PAIN:  Are you having pain? Yes: NPRS scale: 6/10 biceps; in the night  6/10  Pain location: R shoulder/arm/shoulder blade area  Pain description: sharp; stabbing pain in biceps radiating to elbow with big reaching to the side  Aggravating factors: reaching overhead to the side and behind  Relieving factors: ice; TENs unit   PRECAUTIONS: None  WEIGHT BEARING RESTRICTIONS: No  FALLS:  Has patient fallen in last 6 months? No   PATIENT GOALS:get rid of the shoulder pain and increase ROM   NEXT MD VISIT: 03/29/23 (Dr Aundria Rud)   OBJECTIVE:   DIAGNOSTIC FINDINGS:  None available for shoulder; x-ray (-)   02/13/23 MRI Lt shoulder -  some joint space narrowing, subchondral sclerosis, subchondral cyst formation and osteophyte formation in the glenohumeral joint, indicative of osteoarthritis.  POSTURE: Patient presents with head forward posture with increased thoracic kyphosis; shoulders rounded and elevated; scapulae abducted and rotated along the thoracic spine; head of the humerus anterior in orientation.  CERVICAL ROM: 02/02/23 Flexion 43 tight pain Lt upper trap  Extension 48 tight ant/post neck R lateral flexion 36 tight L cervical  L lateral flexion 42 R rotation 71 L rotation 62 tight pain Lt cervical to upper trap    UPPER EXTREMITY ROM:   Active ROM Right eval Left eval Left  01/11/23 Left  02/02/23 Left(passive) 03/15/23 Left passive 03/29/23  AAROM  04/04/23  Shoulder flexion 160 112 130 107 pain  80 125 138  Shoulder extension 59 25 48 31 pain  NT NT   Shoulder abduction (scaption) 168 112 133 92 pain  NT 84 92  Shoulder adduction         Shoulder internal rotation T7 Thumb to waist Thumb  T12 Hand lateral hip pain  NT neutral   Shoulder external rotation (in scapular plane) 95 85 93 45 pain  NT 70 75  Elbow flexion     120 WNL   Elbow extension     0 [facility]  Wrist flexion         Wrist extension          Wrist ulnar deviation         Wrist radial deviation         Wrist pronation         Wrist supination         (Blank rows = not tested) PASSIVE ROM - supine flexion 152; scaption 147; ER 90   AAROM/PROM supine - 04/19/23: PROM ~ 140 deg flexion; 92 deg abduction in scapular plane; 70 deg ER in scapular plane    UPPER EXTREMITY MMT: strength not assessed - moves Lt UE well against gravity; limited by pain  MMT Right eval Left eval  Shoulder flexion    Shoulder extension    Shoulder abduction    Shoulder adduction    Shoulder internal rotation    Shoulder external rotation    Middle trapezius    Lower trapezius    Elbow flexion    Elbow extension    Wrist flexion    Wrist extension    Wrist ulnar deviation    Wrist radial deviation    Wrist pronation    Wrist supination    Grip strength (lbs)    (Blank rows = not tested)  JOINT MOBILITY TESTING:  Significant joint tightness Lt shoulder in all planes  02/02/23: Significant joint tightness Lt shoulder in all planes 03/15/23: NT  UPPER LIMB TENSION TEST:  Positive L with patient unable to achieve 90 deg shoulder abduction for testing. UE in ~ 60 deg abduction with elbow extension produced radicular symptoms into Lt UE   PALPATION:  Muscular tightness Lt shoulder through the upper trap; pecs; leveator; teres 02/02/23: muscular tightness Lt shoulder girdle in upper trap; pecs; leveator; rhomboids; teres 02/09/23: muscular tightness Lt cervical and upper trap area through the shoulder girdle including pecs; leveator; rhomboids; teres 02/15/23: persistent tightness L cervical and upper trap area  03/15/23: muscular tightness L shoulder girdle   */26/24 - Observations:  Patient has poor movement quality with over activation of L upper trap with all active movement of L UE  [facility] Adult PT Treatment:                                                DATE: 04/25/2023 Therapeutic Exercise: Pulley  Flexion, ab/add in scaption Horizontal  ab/adduction with pulley scap squeeze  Standing  Scap  squeezes Shoulder pendulums Chest lift  Shoulder flexion stretch at counter (AAROM walk out/in) Sitting  Initiation of deltoid 3 sec x 10  Prone  Shoulder extension 3 sec x 5 Axial extension with scap squeeze 3 sec x 5  Neuromuscular re-education:  Working with patient in sitting with use of mirror and ball to improve smooth control and improve muscular activation with decreased use of L upper trap. Worked to facilitate posterior shoulder girdle musculature    Manual Therapy: Soft tissue mobilization and trigger point work through the shoulder girdle musculature upper trap and leveator as well as periscapular musculature  Scapular mobilization  Kinesotaping L shoulder Inhibition of upper trap  Anterior shoulder instability  Modalities: Moist heat L shoulder complex; cervical  [facility] Adult PT Treatment:  DATE: 04/21/2023 Therapeutic Exercise: Pulley  Flexion, ab/add in scaption Horizontal ab/adduction with pulley scap squeeze  Standing  Scap  squeezes Shoulder pendulums Chest lift  Shoulder flexion stretch at counter (AAROM walk out/in) Sitting  Initiation of deltoid 3 sec x 10  Prone  Shoulder extension 3 sec x 5 Axial extension with scap squeeze 3 sec x 5  Manual Therapy: Soft tissue mobilization and trigger point work through the cervical musculature; into L shoulder girdle  Scapular mobilization GH mobs  Prone shoulder PROM flexion; scaption; ER in scapular plane  Modalities: Moist heat L shoulder complex; cervical   [facility] Adult PT Treatment:                                                DATE: 04/19/2023 Therapeutic Exercise: Pulley  Flexion, ab/add in scaption Horizontal ab/adduction with pulley scap squeeze  Standing  Doorway 2 lower positions  Row with noodle red TB x 5 Wall slide L UE x 3  Chin tucks 5x10" Scap  squeezes Shoulder pendulums Chest lift   Shoulder flexion stretch at counter (AAROM walk out/in) Prone  Shoulder extension 3 sec x 5 Axial extension with scap squeeze 3 sec x 5  Manual Therapy: Soft tissue mobilization and trigger point work through the cervical musculature; into L shoulder girdle  Scapular mobilization GH mobs  Prone shoulder PROM flexion; scaption; ER in scapular plane  Modalities: Moist heat L shoulder complex; cervical   [facility] Adult PT Treatment:                                                DATE: 04/14/2023 Therapeutic Exercise: Pulley  Flexion, ab/add in scaption Horizontal ab/adduction with pulley scap squeeze  Standing  Doorway 2 lower positions  Row with noodle red TB x 5 Wall slide L UE x 3  Chin tucks 5x10" Scap  squeezes Shoulder pendulums Chest lift  Shoulder flexion stretch at counter (AAROM walk out/in) Prone  Shoulder extension 3 sec x 5 Axial extension with scap squeeze 3 sec x 5  Manual Therapy: Soft tissue mobilization and trigger point work through the cervical musculature; into L shoulder girdle  Scapular mobilization GH mobs  Prone shoulder PROM flexion; scaption; ER in scapular plane  Trigger Point Dry-Needling  Treatment instructions: Expect mild to moderate muscle soreness. S/S of pneumothorax if dry needled over a lung field, and to seek immediate medical attention should they occur. Patient verbalized understanding of these instructions and education.  Patient Consent Given: Yes Education handout provided: Previously provided Muscles treated: L infraspinatus; upper trap Electrical stimulation performed: Yes Parameters: mAmp current; intensity adjusted to patient tolerance  Treatment response/outcome: decreased palpable tightness   Modalities: Moist heat L shoulder complex; cervical    PATIENT EDUCATION: Education details: POC; HEP Person educated: Patient Education method: Programmer, multimedia, Demonstration, Tactile cues, Verbal cues, and Handouts Education  comprehension: verbalized understanding, returned demonstration, verbal cues required, tactile cues required, and needs further education  HOME EXERCISE PROGRAM: (from treatment prior to surgery 03/14/23)  E-mailed to: [email or web address]   Access Code: [email or web address] URL: [email or web address] Date: 01/13/2023 Prepared by: Corlis Leak  Exercises - Seated Shoulder Flexion AAROM with Pulley Behind  - 2 x daily - 7 x weekly -  1 sets - 10 reps - 10 sec  hold - Seated Shoulder Scaption AAROM with Pulley at Side  - 2 x daily - 7 x weekly - 1 sets - 10 reps - 10sec  hold - Seated Cervical Retraction  - 2 x daily - 7 x weekly - 1-2 sets - 5-10 reps - 10 sec  hold - Seated Scapular Retraction  - 2 x daily - 7 x weekly - 1-2 sets - 10 reps - 10 sec  hold - Standing Scapular Retraction  - 3 x daily - 7 x weekly - 1 sets - 10 reps - 10 sec  hold - Shoulder External Rotation and Scapular Retraction  - 3 x daily - 7 x weekly - 1 sets - 10 reps - 3-5 sec   hold - Seated Shoulder W  - 2 x daily - 7 x weekly - 1 sets - 10 reps - 3 sec  hold - Shoulder External Rotation and Scapular Retraction with Resistance  - 2 x daily - 7 x weekly - 1 sets - 10 reps - 3-5 sec  hold - Shoulder W - External Rotation with Resistance  - 2 x daily - 7 x weekly - 1-2 sets - 10 reps - 3 sec  hold - Standing Bilateral Low Shoulder Row with Anchored Resistance  - 2 x daily - 7 x weekly - 1-3 sets - 10 reps - 2-3 sec  hold - Drawing Bow  - 1 x daily - 7 x weekly - 1 sets - 10 reps - 3 sec  hold - Prone Scapular Retraction  - 2 x daily - 7 x weekly - 1 sets - 5-10 reps - 3-5 sec  hold - Cat Cow  - 2 x daily - 7 x weekly - 1 sets - 5-10 reps - 3-5 sec  hold - Cat Cow to Child's Pose  - 2 x daily - 7 x weekly - 1 sets - 3 reps - 20-30 sec  hold  ASSESSMENT:  CLINICAL IMPRESSION: Patient reports increased pain in L shoulder/UE pain in the past 4 days. Evaluation reveals excessive activation of L upper trap and leveator with  poor activation of posterior shoulder girdle musculature and postural stabilizers. Worked on Chief of Staff with mirror and ball for small range movements with good control. Followed by STM through the upper trap and posterior shoulder girdle musculature and kinesotaping to inhabit upper trap and improve scapular position.    04/21/23: ESI went well 04/20/23 and she can see improvement in the L UE symptoms. Manual work decreases muscular tightness and patient does tolerated PROM/stretching better. She continues to have tight but empty end feel. Note some improving muscular tightness through L shoulder girdle with persistent tightness in [facility] joint with decreased mobility and stability in the L scapulothoracic range. Patient is working on her exercises and ROM at home.    Re-eval: 03/15/23: patient returns post surgery 03/14/23 for L shoulder debridement; labral repair; biceps tendon release; removal of bone spurs. She presents with L UE in sling with bulky dressing L shoulder. Patient has some pain but this is minimal due to lasting effects of spinal block. She has poor posture and alignment; limited cervical and L UE mobility; limited functional activity tolerance with no lifting allowed L UE; pain on a constant basis. Patient will benefit from PT to address problems identified.    03/07/23: Patient reports continued pain which is variable in intensity. She was seen by MD and has  had a cervical MRI but she does not have results and will not see MD until 03/17/23. Patient has continued radicular UE symptoms that seem cervical in nature. Shoulder ROM is decreased and patient is unable to tolerate PROM/stretching for L shoulder. We will continue with treatment as tolerated and patient schedules. She is continuing with meds and exercises as tolerated from HEP. She continues to have cervical involvement of L UE symptoms as well as shoulder pathology. She has positive ULTT and muscular tightness to palpation  through the L cervical and upper trap/clavicular area. Responds favorably to intervention through the L cervical spine but decreased symptoms are temporary. Continue axial extension in supine and neural mobilization as pt tolerates. MRI shows arthritis in L shoulder but no RC pathology.   OBJECTIVE IMPAIRMENTS: Lt shoulder pain which started with lifting overhead her luggage when travelling. She has had gradually increasing pain and stiffness in the Lt shoulder. She received an injection ~ 1-2 weeks ago with some improvement. Patient presents with poor posture and alignment; limited A and PROM Lt shoulder; functional weakness due to pain; decreased functional activities and pain on a daily basis    GOALS: Goals reviewed with patient? Yes  SHORT TERM GOALS: Target date: 04/26/2023  Independent in initial HEP  Baseline: Goal status: INITIAL  2.  Increase AROM shoulder flexion and abduction to 100-110 degrees  Baseline:  Goal status: INITIAL   LONG TERM GOALS: Target date: 06/07/2023  Decrease pain in the Lt shoulder by 75-100% allowing patient to return to normal functional activities  Baseline:  Goal status: INITIAL  2.  AROM Lt shoulder WFL's and pain free  Baseline:  Goal status: INITIAL  3.  Patient reports return to normal functional activities including lifting grandson with minimal to no increase in Lt shoulder pain  Baseline:  Goal status: INITIAL  4.  Independent in HEP  Baseline:  Goal status: INITIAL  PLAN:  PT FREQUENCY: 2x/week  PT DURATION: 12 weeks  PLANNED INTERVENTIONS: Therapeutic exercises, Therapeutic activity, Neuromuscular re-education, Patient/Family education, Self Care, Joint mobilization, Aquatic Therapy, Dry Needling, Electrical stimulation, Spinal mobilization, Cryotherapy, Moist heat, Taping, Vasopneumatic device, Ultrasound, Ionotophoresis 4mg /ml Dexamethasone, Manual therapy, and Re-evaluation  PLAN FOR NEXT SESSION: review and progress  exercises; continue with postural correction and education; manual work, DN, modalities as indicated    [employer], PT 04/25/2023, 5:10 PM

## 2023-04-26 DIAGNOSIS — M159 Polyosteoarthritis, unspecified: Secondary | ICD-10-CM | POA: Diagnosis not present

## 2023-04-26 DIAGNOSIS — M7502 Adhesive capsulitis of left shoulder: Secondary | ICD-10-CM | POA: Diagnosis not present

## 2023-04-26 DIAGNOSIS — E161 Other hypoglycemia: Secondary | ICD-10-CM | POA: Diagnosis not present

## 2023-04-26 DIAGNOSIS — T63421A Toxic effect of venom of ants, accidental (unintentional), initial encounter: Secondary | ICD-10-CM | POA: Diagnosis not present

## 2023-04-28 ENCOUNTER — Encounter: Payer: Self-pay | Admitting: Rehabilitative and Restorative Service Providers"

## 2023-04-28 ENCOUNTER — Ambulatory Visit: Payer: BC Managed Care – PPO | Admitting: Rehabilitative and Restorative Service Providers"

## 2023-04-28 DIAGNOSIS — M25512 Pain in left shoulder: Secondary | ICD-10-CM | POA: Diagnosis not present

## 2023-04-28 DIAGNOSIS — G8929 Other chronic pain: Secondary | ICD-10-CM | POA: Diagnosis not present

## 2023-04-28 DIAGNOSIS — R29898 Other symptoms and signs involving the musculoskeletal system: Secondary | ICD-10-CM

## 2023-04-28 DIAGNOSIS — M25511 Pain in right shoulder: Secondary | ICD-10-CM | POA: Diagnosis not present

## 2023-04-28 DIAGNOSIS — M6281 Muscle weakness (generalized): Secondary | ICD-10-CM | POA: Diagnosis not present

## 2023-04-28 NOTE — Therapy (Signed)
OUTPATIENT PHYSICAL THERAPY SHOULDER TREATMENT   Patient Name: Lori King MRN: [id number] DOB:1965-09-05, 57 y.o., female Today's Date: 04/28/2023  END OF SESSION:  PT End of Session - 04/28/23 1015     Visit Number 14    Number of Visits 24    Date for PT Re-Evaluation 06/07/23    Authorization Type [employer] [id number]    PT Start Time 1014    PT Stop Time 1104    PT Time Calculation (min) 50 min    Activity Tolerance Patient tolerated treatment well             Past Medical History:  Diagnosis Date   Adhesive capsulitis of right shoulder    Allergic rhinitis, seasonal    Anemia    Anxiety    MDD (major depressive disorder)    Wears contact lenses    Past Surgical History:  Procedure Laterality Date   BREAST BIOPSY Left 2010   x2 per pt benign   CESAREAN SECTION  X2  last one 02-28-2001   W/  BILATERAL TUBAL LIGATION w/ last c/s   COLONOSCOPY  11/01/2017   HYSTEROSCOPY W/ ENDOMETRIAL ABLATION  2019   LUMBAR LAMINECTOMY/DECOMPRESSION MICRODISCECTOMY  2013   L4  --- S1   SHOULDER ARTHROSCOPY Right 08/28/2020   Procedure: ARTHROSCOPY SHOULDER lysis of adhesions and manipulation under anesthesia, extensive debridement;  Surgeon: Yolonda Kida, MD;  Location: [facility] [place];  Service: Orthopedics;  Laterality: Right;   There are no problems to display for this patient.   PCP: Dr Angelica Chessman  REFERRING PROVIDER: Dr Arlyce Harman  REFERRING DIAG: pain of Lt shoulder  THERAPY DIAG:  Acute pain of left shoulder  Muscle weakness (generalized)  Other symptoms and signs involving the musculoskeletal system  Chronic right shoulder pain  Rationale for Evaluation and Treatment: Rehabilitation  ONSET DATE: 10/24/22  SUBJECTIVE:                                                                                                                                                                                      SUBJECTIVE  STATEMENT: Patient reports that MD has changed meds to help with pain management. She has worked on the neuromuscular re-ed exercises. Tape did seem to help with the shoulder muscular tightness.  03/15/23 RE-EVAL: Patient underwent L shoulder surgery with extensive debridement; labral repair; biceps tendon release; removal of bone spurs 03/14/23.  She has minimal pain due to remaining effects of spinal block for surgery. Bulky dressing limits movement and the effect of ice. Remains in the sling.   Cervical MRI shows degenerative changes C5/6. MD will send order for cervical spine.  Patient reports continued pain in the L UE symptoms. She has stiffness and tightness in the L shoulder and the shoulder is tighter. She does not have the results of the MRI. She is scheduled to see the orthopedist, Dr Aundria Rud, tomorrow 03/17/23. She does not know results of neck MRI. [name] reports that she is taking gabapentin 3 times a day;  the muscle relaxants every 6 hours; finished a steroid dose pack; percocet as needed. She started the antiinflammatory medication this morning. She has less of the sharp pain and can tell the medication is helping. She received a steroid injection with [place]. Pain in the L neck and shoulder area continues. She has tingling and numbness in the ring and little fingers and the thumb and now in the palm of her hand on an intermittent basis. She is having difficulty getting comfortable to sleep.    EVAL: Patient reports that she was lifting her luggage out of the overhead bin when she felt pain in the Lt shoulder and has had some pain in an intermittent basis. She had an injection in Lt shoulder 12/22/22 with some improvement but shoulder is stiff and painful.  Hand dominance: Right  PERTINENT HISTORY: Rt shoulder pain 3 yrs with shoulder surgery for labrum tear and biceps tendon; adhesive capsulitis; reactive hypoglycemic disease; fatty liver  PAIN:  Are you having pain? Yes: NPRS scale: 4/10  shoulder/biceps; in the night  6/10  Pain location: R shoulder/arm/shoulder blade area  Pain description: sharp; stabbing pain in biceps radiating to elbow with big reaching to the side  Aggravating factors: reaching overhead to the side and behind  Relieving factors: ice; TENs unit   PRECAUTIONS: None  WEIGHT BEARING RESTRICTIONS: No  FALLS:  Has patient fallen in last 6 months? No   PATIENT GOALS:get rid of the shoulder pain and increase ROM   NEXT MD VISIT: 03/29/23 (Dr Aundria Rud)   OBJECTIVE:   DIAGNOSTIC FINDINGS:  None available for shoulder; x-ray (-)   02/13/23 MRI Lt shoulder -  some joint space narrowing, subchondral sclerosis, subchondral cyst formation and osteophyte formation in the glenohumeral joint, indicative of osteoarthritis.  POSTURE: Patient presents with head forward posture with increased thoracic kyphosis; shoulders rounded and elevated; scapulae abducted and rotated along the thoracic spine; head of the humerus anterior in orientation.  CERVICAL ROM: 02/02/23 Flexion 43 tight pain Lt upper trap  Extension 48 tight ant/post neck R lateral flexion 36 tight L cervical  L lateral flexion 42 R rotation 71 L rotation 62 tight pain Lt cervical to upper trap    UPPER EXTREMITY ROM:   Active ROM Right eval Left eval Left  01/11/23 Left  02/02/23 Left(passive) 03/15/23 Left passive 03/29/23  AAROM  04/04/23  Shoulder flexion 160 112 130 107 pain  80 125 138  Shoulder extension 59 25 48 31 pain  NT NT   Shoulder abduction (scaption) 168 112 133 92 pain  NT 84 92  Shoulder adduction         Shoulder internal rotation T7 Thumb to waist Thumb  T12 Hand lateral hip pain  NT neutral   Shoulder external rotation (in scapular plane) 95 85 93 45 pain  NT 70 75  Elbow flexion     120 WNL   Elbow extension     0 WFL   Wrist flexion         Wrist extension         Wrist ulnar deviation  Wrist radial deviation         Wrist pronation         Wrist supination          (Blank rows = not tested) PASSIVE ROM - supine flexion 152; scaption 147; ER 90   AAROM/PROM supine - 04/19/23: PROM ~ 140 deg flexion; 92 deg abduction in scapular plane; 70 deg ER in scapular plane    UPPER EXTREMITY MMT: strength not assessed - moves Lt UE well against gravity; limited by pain  MMT Right eval Left eval  Shoulder flexion    Shoulder extension    Shoulder abduction    Shoulder adduction    Shoulder internal rotation    Shoulder external rotation    Middle trapezius    Lower trapezius    Elbow flexion    Elbow extension    Wrist flexion    Wrist extension    Wrist ulnar deviation    Wrist radial deviation    Wrist pronation    Wrist supination    Grip strength (lbs)    (Blank rows = not tested)  JOINT MOBILITY TESTING:  Significant joint tightness Lt shoulder in all planes  02/02/23: Significant joint tightness Lt shoulder in all planes 03/15/23: NT  UPPER LIMB TENSION TEST:  Positive L with patient unable to achieve 90 deg shoulder abduction for testing. UE in ~ 60 deg abduction with elbow extension produced radicular symptoms into Lt UE   PALPATION:  Muscular tightness Lt shoulder through the upper trap; pecs; leveator; teres 02/02/23: muscular tightness Lt shoulder girdle in upper trap; pecs; leveator; rhomboids; teres 02/09/23: muscular tightness Lt cervical and upper trap area through the shoulder girdle including pecs; leveator; rhomboids; teres 02/15/23: persistent tightness L cervical and upper trap area  03/15/23: muscular tightness L shoulder girdle   */26/24 - Observations:  Patient has poor movement quality with over activation of L upper trap with all active movement of L UE  [facility] Adult PT Treatment:                                                DATE: 04/27/2023 Therapeutic Exercise: Pulley  Flexion, ab/add in scaption Horizontal ab/adduction with pulley scap squeeze  Standing  Scap  squeezes Shoulder pendulums Chest lift  Shoulder  flexion stretch at counter (AAROM walk out/in) Prone  Shoulder extension 3 sec x 5 Axial extension with scap squeeze 3 sec x 5  Neuromuscular re-education:  Working with patient in sitting - improve smooth control and improve muscular activation with decreased use of L upper trap with active assistive shoulder elevation. Worked to facilitate posterior shoulder girdle musculature    Manual Therapy: Working on PROM and stretching with PT providing inhibition of upper trap for range into elevation. Soft tissue mobilization and trigger point work through the shoulder girdle musculature upper trap and leveator as well as periscapular musculature  Scapular mobilization Trigger Point Dry-Needling  Treatment instructions: Expect mild to moderate muscle soreness. S/S of pneumothorax if dry needled over a lung field, and to seek immediate medical attention should they occur. Patient verbalized understanding of these instructions and education.  Patient Consent Given: Yes Education handout provided: Previously provided Muscles treated: L posterior cervical musculature; upper trap Electrical stimulation performed: Yes Parameters: mAmp current; intensity adjusted to patient tolerance  Treatment response/outcome: decreased palpable tightness  Kinesotaping L shoulder Inhibition of upper trap  Anterior shoulder instability  Modalities: Moist heat L shoulder complex; cervical  [facility] Adult PT Treatment:                                                DATE: 04/25/2023 Therapeutic Exercise: Pulley  Flexion, ab/add in scaption Horizontal ab/adduction with pulley scap squeeze  Standing  Scap  squeezes Shoulder pendulums Chest lift  Shoulder flexion stretch at counter (AAROM walk out/in) Sitting  Initiation of deltoid 3 sec x 10  Prone  Shoulder extension 3 sec x 5 Axial extension with scap squeeze 3 sec x 5  Neuromuscular re-education:  Working with patient in sitting with use of mirror and  ball to improve smooth control and improve muscular activation with decreased use of L upper trap. Worked to facilitate posterior shoulder girdle musculature    Manual Therapy: Soft tissue mobilization and trigger point work through the shoulder girdle musculature upper trap and leveator as well as periscapular musculature  Scapular mobilization  Kinesotaping L shoulder Inhibition of upper trap  Anterior shoulder instability  Modalities: Moist heat L shoulder complex; cervical  [facility] Adult PT Treatment:                                                DATE: 04/21/2023 Therapeutic Exercise: Pulley  Flexion, ab/add in scaption Horizontal ab/adduction with pulley scap squeeze  Standing  Scap  squeezes Shoulder pendulums Chest lift  Shoulder flexion stretch at counter (AAROM walk out/in) Sitting  Initiation of deltoid 3 sec x 10  Prone  Shoulder extension 3 sec x 5 Axial extension with scap squeeze 3 sec x 5  Manual Therapy: Soft tissue mobilization and trigger point work through the cervical musculature; into L shoulder girdle  Scapular mobilization GH mobs  Prone shoulder PROM flexion; scaption; ER in scapular plane  Modalities: Moist heat L shoulder complex; cervical   [facility] Adult PT Treatment:                                                DATE: 04/19/2023 Therapeutic Exercise: Pulley  Flexion, ab/add in scaption Horizontal ab/adduction with pulley scap squeeze  Standing  Doorway 2 lower positions  Row with noodle red TB x 5 Wall slide L UE x 3  Chin tucks 5x10" Scap  squeezes Shoulder pendulums Chest lift  Shoulder flexion stretch at counter (AAROM walk out/in) Prone  Shoulder extension 3 sec x 5 Axial extension with scap squeeze 3 sec x 5  Manual Therapy: Soft tissue mobilization and trigger point work through the cervical musculature; into L shoulder girdle  Scapular mobilization GH mobs  Prone shoulder PROM flexion; scaption; ER in scapular plane   Modalities: Moist heat L shoulder complex; cervical   [facility] Adult PT Treatment:                                                DATE: 04/14/2023 Therapeutic  Exercise: Pulley  Flexion, ab/add in scaption Horizontal ab/adduction with pulley scap squeeze  Standing  Doorway 2 lower positions  Row with noodle red TB x 5 Wall slide L UE x 3  Chin tucks 5x10" Scap  squeezes Shoulder pendulums Chest lift  Shoulder flexion stretch at counter (AAROM walk out/in) Prone  Shoulder extension 3 sec x 5 Axial extension with scap squeeze 3 sec x 5  Manual Therapy: Soft tissue mobilization and trigger point work through the cervical musculature; into L shoulder girdle  Scapular mobilization GH mobs  Prone shoulder PROM flexion; scaption; ER in scapular plane  Trigger Point Dry-Needling  Treatment instructions: Expect mild to moderate muscle soreness. S/S of pneumothorax if dry needled over a lung field, and to seek immediate medical attention should they occur. Patient verbalized understanding of these instructions and education.  Patient Consent Given: Yes Education handout provided: Previously provided Muscles treated: L infraspinatus; upper trap Electrical stimulation performed: Yes Parameters: mAmp current; intensity adjusted to patient tolerance  Treatment response/outcome: decreased palpable tightness   Modalities: Moist heat L shoulder complex; cervical    PATIENT EDUCATION: Education details: POC; HEP Person educated: Patient Education method: Programmer, multimedia, Demonstration, Tactile cues, Verbal cues, and Handouts Education comprehension: verbalized understanding, returned demonstration, verbal cues required, tactile cues required, and needs further education  HOME EXERCISE PROGRAM: (from treatment prior to surgery 03/14/23)  E-mailed to: [email or web address]   Access Code: [email or web address] URL: [email or web address] Date: 01/13/2023 Prepared by: Corlis Leak  Exercises -  Seated Shoulder Flexion AAROM with Pulley Behind  - 2 x daily - 7 x weekly - 1 sets - 10 reps - 10 sec  hold - Seated Shoulder Scaption AAROM with Pulley at Side  - 2 x daily - 7 x weekly - 1 sets - 10 reps - 10sec  hold - Seated Cervical Retraction  - 2 x daily - 7 x weekly - 1-2 sets - 5-10 reps - 10 sec  hold - Seated Scapular Retraction  - 2 x daily - 7 x weekly - 1-2 sets - 10 reps - 10 sec  hold - Standing Scapular Retraction  - 3 x daily - 7 x weekly - 1 sets - 10 reps - 10 sec  hold - Shoulder External Rotation and Scapular Retraction  - 3 x daily - 7 x weekly - 1 sets - 10 reps - 3-5 sec   hold - Seated Shoulder W  - 2 x daily - 7 x weekly - 1 sets - 10 reps - 3 sec  hold - Shoulder External Rotation and Scapular Retraction with Resistance  - 2 x daily - 7 x weekly - 1 sets - 10 reps - 3-5 sec  hold - Shoulder W - External Rotation with Resistance  - 2 x daily - 7 x weekly - 1-2 sets - 10 reps - 3 sec  hold - Standing Bilateral Low Shoulder Row with Anchored Resistance  - 2 x daily - 7 x weekly - 1-3 sets - 10 reps - 2-3 sec  hold - Drawing Bow  - 1 x daily - 7 x weekly - 1 sets - 10 reps - 3 sec  hold - Prone Scapular Retraction  - 2 x daily - 7 x weekly - 1 sets - 5-10 reps - 3-5 sec  hold - Cat Cow  - 2 x daily - 7 x weekly - 1 sets - 5-10 reps - 3-5 sec  hold - Cat Cow to Child's Pose  - 2  x daily - 7 x weekly - 1 sets - 3 reps - 20-30 sec  hold  ASSESSMENT:  CLINICAL IMPRESSION: Patient reports some decreased pain in L shoulder/UE painMD has changed medication which may be helping. Kinesotaping has decreased over activation of upper trap. She continues to have some discomfort in the L cervical area but decreased palpable tightness in the cervical and upper trap musculature. She is working on Engineer, petroleum re-ed exercises. Continued with DN and STM through the upper trap and posterior shoulder girdle musculature as well as kinesotaping to inhabit upper trap and improve scapular  position.    04/21/23: ESI went well 04/20/23 and she can see improvement in the L UE symptoms. Manual work decreases muscular tightness and patient does tolerated PROM/stretching better. She continues to have tight but empty end feel. Note some improving muscular tightness through L shoulder girdle with persistent tightness in [facility] joint with decreased mobility and stability in the L scapulothoracic range. Patient is working on her exercises and ROM at home.    Re-eval: 03/15/23: patient returns post surgery 03/14/23 for L shoulder debridement; labral repair; biceps tendon release; removal of bone spurs. She presents with L UE in sling with bulky dressing L shoulder. Patient has some pain but this is minimal due to lasting effects of spinal block. She has poor posture and alignment; limited cervical and L UE mobility; limited functional activity tolerance with no lifting allowed L UE; pain on a constant basis. Patient will benefit from PT to address problems identified.    03/07/23: Patient reports continued pain which is variable in intensity. She was seen by MD and has had a cervical MRI but she does not have results and will not see MD until 03/17/23. Patient has continued radicular UE symptoms that seem cervical in nature. Shoulder ROM is decreased and patient is unable to tolerate PROM/stretching for L shoulder. We will continue with treatment as tolerated and patient schedules. She is continuing with meds and exercises as tolerated from HEP. She continues to have cervical involvement of L UE symptoms as well as shoulder pathology. She has positive ULTT and muscular tightness to palpation through the L cervical and upper trap/clavicular area. Responds favorably to intervention through the L cervical spine but decreased symptoms are temporary. Continue axial extension in supine and neural mobilization as pt tolerates. MRI shows arthritis in L shoulder but no RC pathology.   OBJECTIVE IMPAIRMENTS: Lt shoulder  pain which started with lifting overhead her luggage when travelling. She has had gradually increasing pain and stiffness in the Lt shoulder. She received an injection ~ 1-2 weeks ago with some improvement. Patient presents with poor posture and alignment; limited A and PROM Lt shoulder; functional weakness due to pain; decreased functional activities and pain on a daily basis    GOALS: Goals reviewed with patient? Yes  SHORT TERM GOALS: Target date: 04/26/2023  Independent in initial HEP  Baseline: Goal status: INITIAL  2.  Increase AROM shoulder flexion and abduction to 100-110 degrees  Baseline:  Goal status: INITIAL   LONG TERM GOALS: Target date: 06/07/2023  Decrease pain in the Lt shoulder by 75-100% allowing patient to return to normal functional activities  Baseline:  Goal status: INITIAL  2.  AROM Lt shoulder WFL's and pain free  Baseline:  Goal status: INITIAL  3.  Patient reports return to normal functional activities including lifting grandson with minimal to no increase in Lt shoulder pain  Baseline:  Goal status: INITIAL  4.  Independent in HEP  Baseline:  Goal status: INITIAL  PLAN:  PT FREQUENCY: 2x/week  PT DURATION: 12 weeks  PLANNED INTERVENTIONS: Therapeutic exercises, Therapeutic activity, Neuromuscular re-education, Patient/Family education, Self Care, Joint mobilization, Aquatic Therapy, Dry Needling, Electrical stimulation, Spinal mobilization, Cryotherapy, Moist heat, Taping, Vasopneumatic device, Ultrasound, Ionotophoresis 4mg /ml Dexamethasone, Manual therapy, and Re-evaluation  PLAN FOR NEXT SESSION: review and progress exercises; continue with postural correction and education; manual work, DN, modalities as indicated    [employer], PT 04/28/2023, 10:15 AM

## 2023-05-03 ENCOUNTER — Ambulatory Visit
Payer: BC Managed Care – PPO | Attending: Orthopedic Surgery | Admitting: Rehabilitative and Restorative Service Providers"

## 2023-05-03 ENCOUNTER — Encounter: Payer: Self-pay | Admitting: Rehabilitative and Restorative Service Providers"

## 2023-05-03 DIAGNOSIS — M6281 Muscle weakness (generalized): Secondary | ICD-10-CM | POA: Diagnosis not present

## 2023-05-03 DIAGNOSIS — R29898 Other symptoms and signs involving the musculoskeletal system: Secondary | ICD-10-CM | POA: Diagnosis not present

## 2023-05-03 DIAGNOSIS — G8929 Other chronic pain: Secondary | ICD-10-CM | POA: Diagnosis not present

## 2023-05-03 DIAGNOSIS — M25511 Pain in right shoulder: Secondary | ICD-10-CM | POA: Diagnosis not present

## 2023-05-03 DIAGNOSIS — M25512 Pain in left shoulder: Secondary | ICD-10-CM | POA: Diagnosis not present

## 2023-05-03 NOTE — Therapy (Signed)
OUTPATIENT PHYSICAL THERAPY SHOULDER TREATMENT   Patient Name: Lori King MRN: [id number] DOB:April 16, 1966, 57 y.o., female Today's Date: 05/03/2023  END OF SESSION:  PT End of Session - 05/03/23 1334     Visit Number 15    Number of Visits 24    Date for PT Re-Evaluation 06/07/23    Authorization Type [employer] [id number]    PT Start Time 1315    PT Stop Time 1406    PT Time Calculation (min) 51 min    Activity Tolerance Patient tolerated treatment well             Past Medical History:  Diagnosis Date   Adhesive capsulitis of right shoulder    Allergic rhinitis, seasonal    Anemia    Anxiety    MDD (major depressive disorder)    Wears contact lenses    Past Surgical History:  Procedure Laterality Date   BREAST BIOPSY Left 2010   x2 per pt benign   CESAREAN SECTION  X2  last one 02-28-2001   W/  BILATERAL TUBAL LIGATION w/ last c/s   COLONOSCOPY  11/01/2017   HYSTEROSCOPY W/ ENDOMETRIAL ABLATION  2019   LUMBAR LAMINECTOMY/DECOMPRESSION MICRODISCECTOMY  2013   L4  --- S1   SHOULDER ARTHROSCOPY Right 08/28/2020   Procedure: ARTHROSCOPY SHOULDER lysis of adhesions and manipulation under anesthesia, extensive debridement;  Surgeon: Lori Kida, MD;  Location: [facility];  Service: Orthopedics;  Laterality: Right;   There are no problems to display for this patient.   PCP: Dr Lori King  REFERRING PROVIDER: Dr Lori King  REFERRING DIAG: pain of Lt shoulder  THERAPY DIAG:  Acute pain of left shoulder  Muscle weakness (generalized)  Other symptoms and signs involving the musculoskeletal system  Chronic right shoulder pain  Rationale for Evaluation and Treatment: Rehabilitation  ONSET DATE: 10/24/22  SUBJECTIVE:                                                                                                                                                                                      SUBJECTIVE STATEMENT: Patient  reports several good days following last visit. She has slept better and had less pain. She feels the treatment was helpful land the new meds have helped as well.  She is working on the neuromuscular re-ed exercises. Tape did seem to help with the shoulder muscular tightness. Took tape off yesterday.  03/15/23 RE-EVAL: Patient underwent L shoulder surgery with extensive debridement; labral repair; biceps tendon release; removal of bone spurs 03/14/23.  She has minimal pain due to remaining effects of spinal block for surgery. Bulky dressing limits movement and  the effect of ice. Remains in the sling.   Cervical MRI shows degenerative changes C5/6. MD will send order for cervical spine.   Patient reports continued pain in the L UE symptoms. She has stiffness and tightness in the L shoulder and the shoulder is tighter. She does not have the results of the MRI. She is scheduled to see the orthopedist, Dr Lori King, tomorrow 03/17/23. She does not know results of neck MRI. Lori King reports that she is taking gabapentin 3 times a day;  the muscle relaxants every 6 hours; finished a steroid dose pack; percocet as needed. She started the antiinflammatory medication this morning. She has less of the sharp pain and can tell the medication is helping. She received a steroid injection with [place]. Pain in the L neck and shoulder area continues. She has tingling and numbness in the ring and little fingers and the thumb and now in the palm of her hand on an intermittent basis. She is having difficulty getting comfortable to sleep.    EVAL: Patient reports that she was lifting her luggage out of the overhead bin when she felt pain in the Lt shoulder and has had some pain in an intermittent basis. She had an injection in Lt shoulder 12/22/22 with some improvement but shoulder is stiff and painful.  Hand dominance: Right  PERTINENT HISTORY: Rt shoulder pain 3 yrs with shoulder surgery for labrum tear and biceps tendon; adhesive  capsulitis; reactive hypoglycemic disease; fatty liver  PAIN:  Are you having pain? Yes: NPRS scale: 5/10 shoulder/biceps; in the night  6/10 (down to 2/10 over the weekend) Pain location: R shoulder/arm/shoulder blade area  Pain description: sharp; stabbing pain in biceps radiating to elbow with big reaching to the side  Aggravating factors: reaching overhead to the side and behind  Relieving factors: ice; TENs unit   PRECAUTIONS: None  WEIGHT BEARING RESTRICTIONS: No  FALLS:  Has patient fallen in last 6 months? No   PATIENT GOALS:get rid of the shoulder pain and increase ROM   NEXT MD VISIT: 03/29/23 (Dr Lori King)   OBJECTIVE:   DIAGNOSTIC FINDINGS:  None available for shoulder; x-ray (-)   02/13/23 MRI Lt shoulder -  some joint space narrowing, subchondral sclerosis, subchondral cyst formation and osteophyte formation in the glenohumeral joint, indicative of osteoarthritis.  POSTURE: Patient presents with head forward posture with increased thoracic kyphosis; shoulders rounded and elevated; scapulae abducted and rotated along the thoracic spine; head of the humerus anterior in orientation.  CERVICAL ROM: 02/02/23 Flexion 43 tight pain Lt upper trap  Extension 48 tight ant/post neck R lateral flexion 36 tight L cervical  L lateral flexion 42 R rotation 71 L rotation 62 tight pain Lt cervical to upper trap    UPPER EXTREMITY ROM:   Active ROM Right eval Left eval Left  01/11/23 Left  02/02/23 Left(passive) 03/15/23 Left passive 03/29/23  AAROM  04/04/23  Shoulder flexion 160 112 130 107 pain  80 125 138  Shoulder extension 59 25 48 31 pain  NT NT   Shoulder abduction (scaption) 168 112 133 92 pain  NT 84 92  Shoulder adduction         Shoulder internal rotation T7 Thumb to waist Thumb  T12 Hand lateral hip pain  NT neutral   Shoulder external rotation (in scapular plane) 95 85 93 45 pain  NT 70 75  Elbow flexion     120 WNL   Elbow extension     0 [facility]  Wrist flexion          Wrist extension         Wrist ulnar deviation         Wrist radial deviation         Wrist pronation         Wrist supination         (Blank rows = not tested) PASSIVE ROM - supine flexion 152; scaption 147; ER 90   AAROM/PROM supine - 04/19/23: PROM ~ 140 deg flexion; 92 deg abduction in scapular plane; 70 deg ER in scapular plane    UPPER EXTREMITY MMT: strength not assessed - moves Lt UE well against gravity; limited by pain  MMT Right eval Left eval  Shoulder flexion    Shoulder extension    Shoulder abduction    Shoulder adduction    Shoulder internal rotation    Shoulder external rotation    Middle trapezius    Lower trapezius    Elbow flexion    Elbow extension    Wrist flexion    Wrist extension    Wrist ulnar deviation    Wrist radial deviation    Wrist pronation    Wrist supination    Grip strength (lbs)    (Blank rows = not tested)  JOINT MOBILITY TESTING:  Significant joint tightness Lt shoulder in all planes  02/02/23: Significant joint tightness Lt shoulder in all planes 03/15/23: NT  UPPER LIMB TENSION TEST:  Positive L with patient unable to achieve 90 deg shoulder abduction for testing. UE in ~ 60 deg abduction with elbow extension produced radicular symptoms into Lt UE   PALPATION:  Muscular tightness Lt shoulder through the upper trap; pecs; leveator; teres 02/02/23: muscular tightness Lt shoulder girdle in upper trap; pecs; leveator; rhomboids; teres 02/09/23: muscular tightness Lt cervical and upper trap area through the shoulder girdle including pecs; leveator; rhomboids; teres 02/15/23: persistent tightness L cervical and upper trap area  03/15/23: muscular tightness L shoulder girdle   */26/24 - Observations:  Patient has poor movement quality with over activation of L upper trap with all active movement of L UE  [facility] Adult PT Treatment:                                                DATE: 05/03/2023 Therapeutic Exercise: Pulley  Flexion,  ab/add in scaption (PT holding upper trap to decrease scapular elevation) Horizontal ab/adduction with pulley scap squeeze  Standing  Scap  squeezes Shoulder pendulums Chest lift  Shoulder flexion stretch at counter (AAROM walk out/in) Prone  Shoulder extension 3 sec x 5 Axial extension with scap squeeze 3 sec x 5  Neuromuscular re-education:  Working with patient in sitting - improve smooth control and improve muscular activation with decreased use of L upper trap with active assistive shoulder elevation. Worked to facilitate posterior shoulder girdle musculature    Manual Therapy: Working on PROM and stretching with PT providing inhibition of upper trap for range into elevation. Soft tissue mobilization and trigger point work through the shoulder girdle musculature upper trap and leveator as well as periscapular musculature  Scapular mobilization Trigger Point Dry-Needling  Treatment instructions: Expect mild to moderate muscle soreness. S/S of pneumothorax if dry needled over a lung field, and to seek immediate medical attention should they occur. Patient verbalized understanding of these instructions and  education.  Patient Consent Given: Yes Education handout provided: Previously provided Muscles treated: L posterior cervical musculature; upper trap Electrical stimulation performed: Yes Parameters: mAmp current; intensity adjusted to patient tolerance  Treatment response/outcome: decreased palpable tightness    Kinesotaping L shoulder Inhibition of upper trap  Anterior shoulder instability  Modalities: Moist heat L shoulder complex; cervical  [facility] Adult PT Treatment:                                                DATE: 04/27/2023 Therapeutic Exercise: Pulley  Flexion, ab/add in scaption Horizontal ab/adduction with pulley scap squeeze  Standing  Scap  squeezes Shoulder pendulums Chest lift  Shoulder flexion stretch at counter (AAROM walk out/in) Prone  Shoulder  extension 3 sec x 5 Axial extension with scap squeeze 3 sec x 5  Neuromuscular re-education:  Working with patient in sitting - improve smooth control and improve muscular activation with decreased use of L upper trap with active assistive shoulder elevation. Worked to facilitate posterior shoulder girdle musculature    Manual Therapy: Working on PROM and stretching with PT providing inhibition of upper trap for range into elevation. Soft tissue mobilization and trigger point work through the shoulder girdle musculature upper trap and leveator as well as periscapular musculature  Scapular mobilization Trigger Point Dry-Needling  Treatment instructions: Expect mild to moderate muscle soreness. S/S of pneumothorax if dry needled over a lung field, and to seek immediate medical attention should they occur. Patient verbalized understanding of these instructions and education.  Patient Consent Given: Yes Education handout provided: Previously provided Muscles treated: L posterior cervical musculature; upper trap Electrical stimulation performed: Yes Parameters: mAmp current; intensity adjusted to patient tolerance  Treatment response/outcome: decreased palpable tightness    Kinesotaping L shoulder Inhibition of upper trap  Anterior shoulder instability  Modalities: Moist heat L shoulder complex; cervical   PATIENT EDUCATION: Education details: POC; HEP Person educated: Patient Education method: Programmer, multimedia, Demonstration, Tactile cues, Verbal cues, and Handouts Education comprehension: verbalized understanding, returned demonstration, verbal cues required, tactile cues required, and needs further education  HOME EXERCISE PROGRAM: (from treatment prior to surgery 03/14/23)  E-mailed to: [email or web address]   Access Code: [email or web address] URL: [email or web address] Date: 01/13/2023 Prepared by: Corlis Leak  Exercises - Seated Shoulder Flexion AAROM with Pulley Behind  - 2 x  daily - 7 x weekly - 1 sets - 10 reps - 10 sec  hold - Seated Shoulder Scaption AAROM with Pulley at Side  - 2 x daily - 7 x weekly - 1 sets - 10 reps - 10sec  hold - Seated Cervical Retraction  - 2 x daily - 7 x weekly - 1-2 sets - 5-10 reps - 10 sec  hold - Seated Scapular Retraction  - 2 x daily - 7 x weekly - 1-2 sets - 10 reps - 10 sec  hold - Standing Scapular Retraction  - 3 x daily - 7 x weekly - 1 sets - 10 reps - 10 sec  hold - Shoulder External Rotation and Scapular Retraction  - 3 x daily - 7 x weekly - 1 sets - 10 reps - 3-5 sec   hold - Seated Shoulder W  - 2 x daily - 7 x weekly - 1 sets - 10 reps - 3 sec  hold - Shoulder External Rotation and Scapular Retraction with Resistance  -  2 x daily - 7 x weekly - 1 sets - 10 reps - 3-5 sec  hold - Shoulder W - External Rotation with Resistance  - 2 x daily - 7 x weekly - 1-2 sets - 10 reps - 3 sec  hold - Standing Bilateral Low Shoulder Row with Anchored Resistance  - 2 x daily - 7 x weekly - 1-3 sets - 10 reps - 2-3 sec  hold - Drawing Bow  - 1 x daily - 7 x weekly - 1 sets - 10 reps - 3 sec  hold - Prone Scapular Retraction  - 2 x daily - 7 x weekly - 1 sets - 5-10 reps - 3-5 sec  hold - Cat Cow  - 2 x daily - 7 x weekly - 1 sets - 5-10 reps - 3-5 sec  hold - Cat Cow to Child's Pose  - 2 x daily - 7 x weekly - 1 sets - 3 reps - 20-30 sec  hold  ASSESSMENT:  CLINICAL IMPRESSION: Patient reports good response to last treatment and change in meds. She notes decreased pain in L shoulder/UE pain. Kinesotaping has decreased over activation of upper trap. She continues to have some discomfort in the L cervical area but decreased palpable tightness in the cervical and upper trap musculature. She is working on Engineer, petroleum re-ed exercises. Continued with DN and STM through the upper trap and posterior shoulder girdle musculature as well as kinesotaping to inhabit upper trap and improve scapular position.  Will add isometric step back and ER/IR at  next visit if tolerated  04/21/23: ESI went well 04/20/23 and she can see improvement in the L UE symptoms. Manual work decreases muscular tightness and patient does tolerated PROM/stretching better. She continues to have tight but empty end feel. Note some improving muscular tightness through L shoulder girdle with persistent tightness in [facility] joint with decreased mobility and stability in the L scapulothoracic range. Patient is working on her exercises and ROM at home.    Re-eval: 03/15/23: patient returns post surgery 03/14/23 for L shoulder debridement; labral repair; biceps tendon release; removal of bone spurs. She presents with L UE in sling with bulky dressing L shoulder. Patient has some pain but this is minimal due to lasting effects of spinal block. She has poor posture and alignment; limited cervical and L UE mobility; limited functional activity tolerance with no lifting allowed L UE; pain on a constant basis. Patient will benefit from PT to address problems identified.    03/07/23: Patient reports continued pain which is variable in intensity. She was seen by MD and has had a cervical MRI but she does not have results and will not see MD until 03/17/23. Patient has continued radicular UE symptoms that seem cervical in nature. Shoulder ROM is decreased and patient is unable to tolerate PROM/stretching for L shoulder. We will continue with treatment as tolerated and patient schedules. She is continuing with meds and exercises as tolerated from HEP. She continues to have cervical involvement of L UE symptoms as well as shoulder pathology. She has positive ULTT and muscular tightness to palpation through the L cervical and upper trap/clavicular area. Responds favorably to intervention through the L cervical spine but decreased symptoms are temporary. Continue axial extension in supine and neural mobilization as pt tolerates. MRI shows arthritis in L shoulder but no RC pathology.   OBJECTIVE IMPAIRMENTS:  Lt shoulder pain which started with lifting overhead her luggage when travelling. She has had gradually  increasing pain and stiffness in the Lt shoulder. She received an injection ~ 1-2 weeks ago with some improvement. Patient presents with poor posture and alignment; limited A and PROM Lt shoulder; functional weakness due to pain; decreased functional activities and pain on a daily basis    GOALS: Goals reviewed with patient? Yes  SHORT TERM GOALS: Target date: 04/26/2023  Independent in initial HEP  Baseline: Goal status: INITIAL  2.  Increase AROM shoulder flexion and abduction to 100-110 degrees  Baseline:  Goal status: INITIAL   LONG TERM GOALS: Target date: 06/07/2023  Decrease pain in the Lt shoulder by 75-100% allowing patient to return to normal functional activities  Baseline:  Goal status: INITIAL  2.  AROM Lt shoulder WFL's and pain free  Baseline:  Goal status: INITIAL  3.  Patient reports return to normal functional activities including lifting grandson with minimal to no increase in Lt shoulder pain  Baseline:  Goal status: INITIAL  4.  Independent in HEP  Baseline:  Goal status: INITIAL  PLAN:  PT FREQUENCY: 2x/week  PT DURATION: 12 weeks  PLANNED INTERVENTIONS: Therapeutic exercises, Therapeutic activity, Neuromuscular re-education, Patient/Family education, Self Care, Joint mobilization, Aquatic Therapy, Dry Needling, Electrical stimulation, Spinal mobilization, Cryotherapy, Moist heat, Taping, Vasopneumatic device, Ultrasound, Ionotophoresis 4mg /ml Dexamethasone, Manual therapy, and Re-evaluation  PLAN FOR NEXT SESSION: review and progress exercises; continue with postural correction and education; manual work, DN, modalities as indicated    [employer], PT 05/03/2023, 1:35 PM

## 2023-05-05 ENCOUNTER — Encounter: Payer: Self-pay | Admitting: Rehabilitative and Restorative Service Providers"

## 2023-05-05 ENCOUNTER — Ambulatory Visit: Payer: BC Managed Care – PPO | Admitting: Rehabilitative and Restorative Service Providers"

## 2023-05-05 DIAGNOSIS — M6281 Muscle weakness (generalized): Secondary | ICD-10-CM | POA: Diagnosis not present

## 2023-05-05 DIAGNOSIS — G8929 Other chronic pain: Secondary | ICD-10-CM | POA: Diagnosis not present

## 2023-05-05 DIAGNOSIS — M25512 Pain in left shoulder: Secondary | ICD-10-CM | POA: Diagnosis not present

## 2023-05-05 DIAGNOSIS — R29898 Other symptoms and signs involving the musculoskeletal system: Secondary | ICD-10-CM

## 2023-05-05 DIAGNOSIS — M25511 Pain in right shoulder: Secondary | ICD-10-CM | POA: Diagnosis not present

## 2023-05-05 NOTE — Therapy (Signed)
OUTPATIENT PHYSICAL THERAPY SHOULDER TREATMENT   Patient Name: Lori King MRN: [id number] DOB:12-06-1965, 57 y.o., female Today's Date: 05/05/2023  END OF SESSION:  PT End of Session - 05/05/23 0807     Visit Number 16    Number of Visits 24    Date for PT Re-Evaluation 06/07/23    Authorization Type [employer] [id number]    PT Start Time 0800    PT Stop Time 0853    PT Time Calculation (min) 53 min    Activity Tolerance Patient tolerated treatment well             Past Medical History:  Diagnosis Date   Adhesive capsulitis of right shoulder    Allergic rhinitis, seasonal    Anemia    Anxiety    MDD (major depressive disorder)    Wears contact lenses    Past Surgical History:  Procedure Laterality Date   BREAST BIOPSY Left 2010   x2 per pt benign   CESAREAN SECTION  X2  last one 02-28-2001   W/  BILATERAL TUBAL LIGATION w/ last c/s   COLONOSCOPY  11/01/2017   HYSTEROSCOPY W/ ENDOMETRIAL ABLATION  2019   LUMBAR LAMINECTOMY/DECOMPRESSION MICRODISCECTOMY  2013   L4  --- S1   SHOULDER ARTHROSCOPY Right 08/28/2020   Procedure: ARTHROSCOPY SHOULDER lysis of adhesions and manipulation under anesthesia, extensive debridement;  Surgeon: Lori Kida, MD;  Location: [facility] [place];  Service: Orthopedics;  Laterality: Right;   There are no problems to display for this patient.   PCP: Dr Lori King  REFERRING PROVIDER: Dr Lori King  REFERRING DIAG: pain of Lt shoulder  THERAPY DIAG:  Acute pain of left shoulder  Muscle weakness (generalized)  Other symptoms and signs involving the musculoskeletal system  Rationale for Evaluation and Treatment: Rehabilitation  ONSET DATE: 10/24/22  SUBJECTIVE:                                                                                                                                                                                      SUBJECTIVE STATEMENT: Patient reports increased tightness in  the L upper trap. She has ben working on her exercises. Some nights she is sleeping better with less pain. She feels the treatment is helpful and the new meds have helped as well.  She is working on the neuromuscular re-ed exercises. Tape helps with the shoulder muscular tightness. She is having some "really good days now and then will have some bad days".  03/15/23 RE-EVAL: Patient underwent L shoulder surgery with extensive debridement; labral repair; biceps tendon release; removal of bone spurs 03/14/23.  She has minimal pain due to  remaining effects of spinal block for surgery. Bulky dressing limits movement and the effect of ice. Remains in the sling.   Cervical MRI shows degenerative changes C5/6. MD will send order for cervical spine.   Patient reports continued pain in the L UE symptoms. She has stiffness and tightness in the L shoulder and the shoulder is tighter. She does not have the results of the MRI. She is scheduled to see the orthopedist, Dr Lori King, tomorrow 03/17/23. She does not know results of neck MRI. Lori King reports that she is taking gabapentin 3 times a day;  the muscle relaxants every 6 hours; finished a steroid dose pack; percocet as needed. She started the antiinflammatory medication this morning. She has less of the sharp pain and can tell the medication is helping. She received a steroid injection with [place]. Pain in the L neck and shoulder area continues. She has tingling and numbness in the ring and little fingers and the thumb and now in the palm of her hand on an intermittent basis. She is having difficulty getting comfortable to sleep.    EVAL: Patient reports that she was lifting her luggage out of the overhead bin when she felt pain in the Lt shoulder and has had some pain in an intermittent basis. She had an injection in Lt shoulder 12/22/22 with some improvement but shoulder is stiff and painful.  Hand dominance: Right  PERTINENT HISTORY: Rt shoulder pain 3 yrs with  shoulder surgery for labrum tear and biceps tendon; adhesive capsulitis; reactive hypoglycemic disease; fatty liver  PAIN:  Are you having pain? Yes: NPRS scale: 4/10 shoulder/biceps; in the night  5/10 (down to 2/10 over the weekend) Pain location: R shoulder/arm/shoulder blade area  Pain description: sharp; stabbing pain in biceps radiating to elbow with big reaching to the side  Aggravating factors: reaching overhead to the side and behind  Relieving factors: ice; TENs unit   PRECAUTIONS: None  WEIGHT BEARING RESTRICTIONS: No  FALLS:  Has patient fallen in last 6 months? No   PATIENT GOALS:get rid of the shoulder pain and increase ROM   NEXT MD VISIT: 03/29/23 (Dr Lori King)   OBJECTIVE:   DIAGNOSTIC FINDINGS:  None available for shoulder; x-ray (-)   02/13/23 MRI Lt shoulder -  some joint space narrowing, subchondral sclerosis, subchondral cyst formation and osteophyte formation in the glenohumeral joint, indicative of osteoarthritis.  POSTURE: Patient presents with head forward posture with increased thoracic kyphosis; shoulders rounded and elevated; scapulae abducted and rotated along the thoracic spine; head of the humerus anterior in orientation.  CERVICAL ROM: 02/02/23 Flexion 43 tight pain Lt upper trap  Extension 48 tight ant/post neck R lateral flexion 36 tight L cervical  L lateral flexion 42 R rotation 71 L rotation 62 tight pain Lt cervical to upper trap    UPPER EXTREMITY ROM:   Active ROM Right eval Left eval Left  01/11/23 Left  02/02/23 Left(passive) 03/15/23 Left passive 03/29/23  AAROM  04/04/23 AAROM 05/05/23  Shoulder flexion 160 112 130 107 pain  80 125 138 148  Shoulder extension 59 25 48 31 pain  NT NT    Shoulder abduction (scaption) 168 112 133 92 pain  NT 84 92 128  Shoulder adduction          Shoulder internal rotation T7 Thumb to waist Thumb  T12 Hand lateral hip pain  NT neutral    Shoulder external rotation (in scapular plane) 95 85 93 45 pain   NT 70 75  75  Elbow flexion     120 WNL    Elbow extension     0 WFL    Wrist flexion          Wrist extension          Wrist ulnar deviation          Wrist radial deviation          Wrist pronation          Wrist supination          (Blank rows = not tested) PASSIVE ROM - supine flexion 152; scaption 147; ER 90   AAROM/PROM supine - 04/19/23: PROM ~ 140 deg flexion; 92 deg abduction in scapular plane; 70 deg ER in scapular plane    UPPER EXTREMITY MMT: strength not assessed - moves Lt UE well against gravity; limited by pain  MMT Right eval Left eval  Shoulder flexion    Shoulder extension    Shoulder abduction    Shoulder adduction    Shoulder internal rotation    Shoulder external rotation    Middle trapezius    Lower trapezius    Elbow flexion    Elbow extension    Wrist flexion    Wrist extension    Wrist ulnar deviation    Wrist radial deviation    Wrist pronation    Wrist supination    Grip strength (lbs)    (Blank rows = not tested)  JOINT MOBILITY TESTING:  Significant joint tightness Lt shoulder in all planes  02/02/23: Significant joint tightness Lt shoulder in all planes 03/15/23: NT  UPPER LIMB TENSION TEST:  Positive L with patient unable to achieve 90 deg shoulder abduction for testing. UE in ~ 60 deg abduction with elbow extension produced radicular symptoms into Lt UE   PALPATION:  Muscular tightness Lt shoulder through the upper trap; pecs; leveator; teres 02/02/23: muscular tightness Lt shoulder girdle in upper trap; pecs; leveator; rhomboids; teres 02/09/23: muscular tightness Lt cervical and upper trap area through the shoulder girdle including pecs; leveator; rhomboids; teres 02/15/23: persistent tightness L cervical and upper trap area  03/15/23: muscular tightness L shoulder girdle   */26/24 - Observations:  Patient has poor movement quality with over activation of L upper trap with all active movement of L UE  [facility] Adult PT Treatment:                                                 DATE: 05/05/2023 Therapeutic Exercise: Pulley  Flexion, ab/add in scaption (PT holding upper trap to decrease scapular elevation) Horizontal ab/adduction with pulley scap squeeze  Standing  Scap  squeezes Shoulder pendulums Chest lift  Shoulder flexion stretch at counter (AAROM walk out/in) Prone  Shoulder extension 3 sec x 5 Axial extension with scap squeeze 3 sec x 5  Neuromuscular re-education:  Working with patient in sitting - improve smooth control and improve muscular activation with decreased use of L upper trap with active assistive shoulder elevation. Worked to facilitate posterior shoulder girdle musculature    Manual Therapy: Working on PROM and stretching with PT providing inhibition of upper trap for range into elevation. Soft tissue mobilization and trigger point work through the shoulder girdle musculature upper trap and leveator as well as periscapular musculature  Scapular mobilization Trigger Point Dry-Needling  Treatment instructions: Expect mild to  moderate muscle soreness. S/S of pneumothorax if dry needled over a lung field, and to seek immediate medical attention should they occur. Patient verbalized understanding of these instructions and education.  Patient Consent Given: Yes Education handout provided: Previously provided Muscles treated: L posterior cervical musculature; upper trap Electrical stimulation performed: Yes Parameters: mAmp current; intensity adjusted to patient tolerance  Treatment response/outcome: decreased palpable tightness    Kinesotaping L shoulder Inhibition of upper trap  Anterior shoulder instability  Modalities: Moist heat L shoulder complex; cervical   [facility] Adult PT Treatment:                                                DATE: 05/03/2023 Therapeutic Exercise: Pulley  Flexion, ab/add in scaption (PT holding upper trap to decrease scapular elevation) Horizontal ab/adduction with pulley scap  squeeze  Standing  Scap  squeezes Shoulder pendulums Chest lift  Shoulder flexion stretch at counter (AAROM walk out/in) Prone  Shoulder extension 3 sec x 5 Axial extension with scap squeeze 3 sec x 5  Neuromuscular re-education:  Working with patient in sitting - improve smooth control and improve muscular activation with decreased use of L upper trap with active assistive shoulder elevation. Worked to facilitate posterior shoulder girdle musculature    Manual Therapy: Working on PROM and stretching with PT providing inhibition of upper trap for range into elevation. Soft tissue mobilization and trigger point work through the shoulder girdle musculature upper trap and leveator as well as periscapular musculature  Scapular mobilization Trigger Point Dry-Needling  Treatment instructions: Expect mild to moderate muscle soreness. S/S of pneumothorax if dry needled over a lung field, and to seek immediate medical attention should they occur. Patient verbalized understanding of these instructions and education.  Patient Consent Given: Yes Education handout provided: Previously provided Muscles treated: L posterior cervical musculature; upper trap Electrical stimulation performed: Yes Parameters: mAmp current; intensity adjusted to patient tolerance  Treatment response/outcome: decreased palpable tightness    Kinesotaping L shoulder Inhibition of upper trap  Anterior shoulder instability  Modalities: Moist heat L shoulder complex; cervical   PATIENT EDUCATION: Education details: POC; HEP Person educated: Patient Education method: Programmer, multimedia, Demonstration, Tactile cues, Verbal cues, and Handouts Education comprehension: verbalized understanding, returned demonstration, verbal cues required, tactile cues required, and needs further education  HOME EXERCISE PROGRAM: (from treatment prior to surgery 03/14/23)  E-mailed to: [email or web address]   Access Code: [email or web address] URL:  [email or web address] Date: 01/13/2023 Prepared by: Corlis Leak  Exercises - Seated Shoulder Flexion AAROM with Pulley Behind  - 2 x daily - 7 x weekly - 1 sets - 10 reps - 10 sec  hold - Seated Shoulder Scaption AAROM with Pulley at Side  - 2 x daily - 7 x weekly - 1 sets - 10 reps - 10sec  hold - Seated Cervical Retraction  - 2 x daily - 7 x weekly - 1-2 sets - 5-10 reps - 10 sec  hold - Seated Scapular Retraction  - 2 x daily - 7 x weekly - 1-2 sets - 10 reps - 10 sec  hold - Standing Scapular Retraction  - 3 x daily - 7 x weekly - 1 sets - 10 reps - 10 sec  hold - Shoulder External Rotation and Scapular Retraction  - 3 x daily - 7 x weekly - 1 sets - 10 reps - 3-5 sec  hold - Seated Shoulder W  - 2 x daily - 7 x weekly - 1 sets - 10 reps - 3 sec  hold - Shoulder External Rotation and Scapular Retraction with Resistance  - 2 x daily - 7 x weekly - 1 sets - 10 reps - 3-5 sec  hold - Shoulder W - External Rotation with Resistance  - 2 x daily - 7 x weekly - 1-2 sets - 10 reps - 3 sec  hold - Standing Bilateral Low Shoulder Row with Anchored Resistance  - 2 x daily - 7 x weekly - 1-3 sets - 10 reps - 2-3 sec  hold - Drawing Bow  - 1 x daily - 7 x weekly - 1 sets - 10 reps - 3 sec  hold - Prone Scapular Retraction  - 2 x daily - 7 x weekly - 1 sets - 5-10 reps - 3-5 sec  hold - Cat Cow  - 2 x daily - 7 x weekly - 1 sets - 5-10 reps - 3-5 sec  hold - Cat Cow to Child's Pose  - 2 x daily - 7 x weekly - 1 sets - 3 reps - 20-30 sec  hold  ASSESSMENT:  CLINICAL IMPRESSION: Patient reports good response to treatment and change in meds. She has some good days and some bad days. She notes overall decreased pain in L shoulder/UE pain. Kinesotaping has decreased over activation of upper trap. She continues to have some discomfort in the L cervical area but decreased palpable tightness in the cervical and upper trap musculature. She is working on Engineer, petroleum re-ed exercises. Continued with  DN and STM through the upper trap and posterior shoulder girdle musculature as well as kinesotaping to inhabit upper trap and improve scapular position.     04/21/23: ESI went well 04/20/23 and she can see improvement in the L UE symptoms. Manual work decreases muscular tightness and patient does tolerated PROM/stretching better. She continues to have tight but empty end feel. Note some improving muscular tightness through L shoulder girdle with persistent tightness in [facility] joint with decreased mobility and stability in the L scapulothoracic range. Patient is working on her exercises and ROM at home.    Re-eval: 03/15/23: patient returns post surgery 03/14/23 for L shoulder debridement; labral repair; biceps tendon release; removal of bone spurs. She presents with L UE in sling with bulky dressing L shoulder. Patient has some pain but this is minimal due to lasting effects of spinal block. She has poor posture and alignment; limited cervical and L UE mobility; limited functional activity tolerance with no lifting allowed L UE; pain on a constant basis. Patient will benefit from PT to address problems identified.    03/07/23: Patient reports continued pain which is variable in intensity. She was seen by MD and has had a cervical MRI but she does not have results and will not see MD until 03/17/23. Patient has continued radicular UE symptoms that seem cervical in nature. Shoulder ROM is decreased and patient is unable to tolerate PROM/stretching for L shoulder. We will continue with treatment as tolerated and patient schedules. She is continuing with meds and exercises as tolerated from HEP. She continues to have cervical involvement of L UE symptoms as well as shoulder pathology. She has positive ULTT and muscular tightness to palpation through the L cervical and upper trap/clavicular area. Responds favorably to intervention through the L cervical spine but decreased symptoms are temporary. Continue axial extension  in supine and  neural mobilization as pt tolerates. MRI shows arthritis in L shoulder but no RC pathology.   OBJECTIVE IMPAIRMENTS: Lt shoulder pain which started with lifting overhead her luggage when travelling. She has had gradually increasing pain and stiffness in the Lt shoulder. She received an injection ~ 1-2 weeks ago with some improvement. Patient presents with poor posture and alignment; limited A and PROM Lt shoulder; functional weakness due to pain; decreased functional activities and pain on a daily basis    GOALS: Goals reviewed with patient? Yes  SHORT TERM GOALS: Target date: 04/26/2023  Independent in initial HEP  Baseline: Goal status: INITIAL  2.  Increase AROM shoulder flexion and abduction to 100-110 degrees  Baseline:  Goal status: INITIAL   LONG TERM GOALS: Target date: 06/07/2023  Decrease pain in the Lt shoulder by 75-100% allowing patient to return to normal functional activities  Baseline:  Goal status: INITIAL  2.  AROM Lt shoulder WFL's and pain free  Baseline:  Goal status: INITIAL  3.  Patient reports return to normal functional activities including lifting grandson with minimal to no increase in Lt shoulder pain  Baseline:  Goal status: INITIAL  4.  Independent in HEP  Baseline:  Goal status: INITIAL  PLAN:  PT FREQUENCY: 2x/week  PT DURATION: 12 weeks  PLANNED INTERVENTIONS: Therapeutic exercises, Therapeutic activity, Neuromuscular re-education, Patient/Family education, Self Care, Joint mobilization, Aquatic Therapy, Dry Needling, Electrical stimulation, Spinal mobilization, Cryotherapy, Moist heat, Taping, Vasopneumatic device, Ultrasound, Ionotophoresis 4mg /ml Dexamethasone, Manual therapy, and Re-evaluation  PLAN FOR NEXT SESSION: review and progress exercises; continue with postural correction and education; manual work, DN, modalities as indicated    [employer], PT 05/05/2023, 8:07 AM

## 2023-05-10 ENCOUNTER — Encounter: Payer: Self-pay | Admitting: Rehabilitative and Restorative Service Providers"

## 2023-05-10 ENCOUNTER — Ambulatory Visit: Payer: BC Managed Care – PPO | Admitting: Rehabilitative and Restorative Service Providers"

## 2023-05-10 DIAGNOSIS — M25511 Pain in right shoulder: Secondary | ICD-10-CM | POA: Diagnosis not present

## 2023-05-10 DIAGNOSIS — R29898 Other symptoms and signs involving the musculoskeletal system: Secondary | ICD-10-CM

## 2023-05-10 DIAGNOSIS — G8929 Other chronic pain: Secondary | ICD-10-CM

## 2023-05-10 DIAGNOSIS — M25512 Pain in left shoulder: Secondary | ICD-10-CM | POA: Diagnosis not present

## 2023-05-10 DIAGNOSIS — M6281 Muscle weakness (generalized): Secondary | ICD-10-CM | POA: Diagnosis not present

## 2023-05-10 NOTE — Therapy (Signed)
OUTPATIENT PHYSICAL THERAPY SHOULDER TREATMENT   Patient Name: Lori King MRN: [id number] DOB:1966/07/28, 57 y.o., female Today's Date: 05/10/2023  END OF SESSION:  PT End of Session - 05/10/23 1145     Visit Number 17    Number of Visits 24    Date for PT Re-Evaluation 06/07/23    Authorization Type [employer] [id number]    PT Start Time 1144    PT Stop Time 1234    PT Time Calculation (min) 50 min    Activity Tolerance Patient tolerated treatment well             Past Medical History:  Diagnosis Date   Adhesive capsulitis of right shoulder    Allergic rhinitis, seasonal    Anemia    Anxiety    MDD (major depressive disorder)    Wears contact lenses    Past Surgical History:  Procedure Laterality Date   BREAST BIOPSY Left 2010   x2 per pt benign   CESAREAN SECTION  X2  last one 02-28-2001   W/  BILATERAL TUBAL LIGATION w/ last c/s   COLONOSCOPY  11/01/2017   HYSTEROSCOPY W/ ENDOMETRIAL ABLATION  2019   LUMBAR LAMINECTOMY/DECOMPRESSION MICRODISCECTOMY  2013   L4  --- S1   SHOULDER ARTHROSCOPY Right 08/28/2020   Procedure: ARTHROSCOPY SHOULDER lysis of adhesions and manipulation under anesthesia, extensive debridement;  Surgeon: Yolonda Kida, MD;  Location: [facility];  Service: Orthopedics;  Laterality: Right;   There are no problems to display for this patient.   PCP: Dr Angelica Chessman  REFERRING PROVIDER: Dr Arlyce Harman  REFERRING DIAG: pain of Lt shoulder  THERAPY DIAG:  Acute pain of left shoulder  Muscle weakness (generalized)  Other symptoms and signs involving the musculoskeletal system  Chronic right shoulder pain  Rationale for Evaluation and Treatment: Rehabilitation  ONSET DATE: 10/24/22  SUBJECTIVE:                                                                                                                                                                                      SUBJECTIVE  STATEMENT: Patient reports that she has had 5 great days with less tightness and pain. She went swimming for 53 minutes yesterday and has had some soreness from the swimming. She has persistent L upper trap. She has been working on her exercises. Some nights she is sleeping better with less pain. She feels the treatment is helpful and the new meds have helped as well.  She is working on the neuromuscular re-ed exercises. Tape helps with the shoulder muscular tightness.   03/15/23 RE-EVAL: Patient underwent L shoulder surgery with extensive debridement;  labral repair; biceps tendon release; removal of bone spurs 03/14/23.  She has minimal pain due to remaining effects of spinal block for surgery. Bulky dressing limits movement and the effect of ice. Remains in the sling.   Cervical MRI shows degenerative changes C5/6. MD will send order for cervical spine.   Patient reports continued pain in the L UE symptoms. She has stiffness and tightness in the L shoulder and the shoulder is tighter. She does not have the results of the MRI. She is scheduled to see the orthopedist, Dr Aundria Rud, tomorrow 03/17/23. She does not know results of neck MRI. [name] reports that she is taking gabapentin 3 times a day;  the muscle relaxants every 6 hours; finished a steroid dose pack; percocet as needed. She started the antiinflammatory medication this morning. She has less of the sharp pain and can tell the medication is helping. She received a steroid injection with [place]. Pain in the L neck and shoulder area continues. She has tingling and numbness in the ring and little fingers and the thumb and now in the palm of her hand on an intermittent basis. She is having difficulty getting comfortable to sleep.    EVAL: Patient reports that she was lifting her luggage out of the overhead bin when she felt pain in the Lt shoulder and has had some pain in an intermittent basis. She had an injection in Lt shoulder 12/22/22 with some improvement  but shoulder is stiff and painful.  Hand dominance: Right  PERTINENT HISTORY: Rt shoulder pain 3 yrs with shoulder surgery for labrum tear and biceps tendon; adhesive capsulitis; reactive hypoglycemic disease; fatty liver  PAIN:  Are you having pain? Yes: NPRS scale: 4/10 shoulder/biceps; in the night  5/10 (down to 2/10 over the weekend) Pain location: R shoulder/arm/shoulder blade area  Pain description: sharp; stabbing pain in biceps radiating to elbow with big reaching to the side  Aggravating factors: reaching overhead to the side and behind  Relieving factors: ice; TENs unit   PRECAUTIONS: None  WEIGHT BEARING RESTRICTIONS: No  FALLS:  Has patient fallen in last 6 months? No   PATIENT GOALS:get rid of the shoulder pain and increase ROM   NEXT MD VISIT: 03/29/23 (Dr Aundria Rud)   OBJECTIVE:   DIAGNOSTIC FINDINGS:  None available for shoulder; x-ray (-)   02/13/23 MRI Lt shoulder -  some joint space narrowing, subchondral sclerosis, subchondral cyst formation and osteophyte formation in the glenohumeral joint, indicative of osteoarthritis.  POSTURE: Patient presents with head forward posture with increased thoracic kyphosis; shoulders rounded and elevated; scapulae abducted and rotated along the thoracic spine; head of the humerus anterior in orientation.  CERVICAL ROM: 02/02/23 Flexion 43 tight pain Lt upper trap  Extension 48 tight ant/post neck R lateral flexion 36 tight L cervical  L lateral flexion 42 R rotation 71 L rotation 62 tight pain Lt cervical to upper trap    UPPER EXTREMITY ROM:   Active ROM Right eval Left eval Left  01/11/23 Left  02/02/23 Left(passive) 03/15/23 Left passive 03/29/23  AAROM  04/04/23 AAROM 05/05/23  Shoulder flexion 160 112 130 107 pain  80 125 138 148  Shoulder extension 59 25 48 31 pain  NT NT    Shoulder abduction (scaption) 168 112 133 92 pain  NT 84 92 128  Shoulder adduction          Shoulder internal rotation T7 Thumb to waist  Thumb  T12 Hand lateral hip pain  NT neutral  Shoulder external rotation (in scapular plane) 95 85 93 45 pain  NT 70 75 75  Elbow flexion     120 WNL    Elbow extension     0 WFL    Wrist flexion          Wrist extension          Wrist ulnar deviation          Wrist radial deviation          Wrist pronation          Wrist supination          (Blank rows = not tested) PASSIVE ROM - supine flexion 152; scaption 147; ER 90   AAROM/PROM supine - 04/19/23: PROM ~ 140 deg flexion; 92 deg abduction in scapular plane; 70 deg ER in scapular plane    UPPER EXTREMITY MMT: strength not assessed - moves Lt UE well against gravity; limited by pain  MMT Right eval Left eval  Shoulder flexion    Shoulder extension    Shoulder abduction    Shoulder adduction    Shoulder internal rotation    Shoulder external rotation    Middle trapezius    Lower trapezius    Elbow flexion    Elbow extension    Wrist flexion    Wrist extension    Wrist ulnar deviation    Wrist radial deviation    Wrist pronation    Wrist supination    Grip strength (lbs)    (Blank rows = not tested)  JOINT MOBILITY TESTING:  Significant joint tightness Lt shoulder in all planes  02/02/23: Significant joint tightness Lt shoulder in all planes 03/15/23: NT  UPPER LIMB TENSION TEST:  Positive L with patient unable to achieve 90 deg shoulder abduction for testing. UE in ~ 60 deg abduction with elbow extension produced radicular symptoms into Lt UE   PALPATION:  Muscular tightness Lt shoulder through the upper trap; pecs; leveator; teres 02/02/23: muscular tightness Lt shoulder girdle in upper trap; pecs; leveator; rhomboids; teres 02/09/23: muscular tightness Lt cervical and upper trap area through the shoulder girdle including pecs; leveator; rhomboids; teres 02/15/23: persistent tightness L cervical and upper trap area  03/15/23: muscular tightness L shoulder girdle   */26/24 - Observations:  Patient has poor movement  quality with over activation of L upper trap with all active movement of L UE  [facility] Adult PT Treatment:                                                DATE: 05/10/2023 Therapeutic Exercise: Pulley  Flexion, ab/add in scaption (PT holding upper trap to decrease scapular elevation) Horizontal ab/adduction with pulley scap squeeze  Standing  Scap  squeezes Shoulder pendulums Chest lift  Shoulder flexion stretch at counter (AAROM walk out/in) Prone  Shoulder extension 3 sec x 5 Axial extension with scap squeeze 3 sec x 5  Neuromuscular re-education:  Working with patient in sitting - improve smooth control and improve muscular activation with decreased use of L upper trap with active assistive shoulder elevation. Worked to facilitate posterior shoulder girdle musculature    Manual Therapy: Working on PROM and stretching with PT providing inhibition of upper trap for range into elevation. Soft tissue mobilization and trigger point work through the shoulder girdle musculature upper trap and leveator as well  as periscapular musculature  Scapular mobilization Trigger Point Dry-Needling  Treatment instructions: Expect mild to moderate muscle soreness. S/S of pneumothorax if dry needled over a lung field, and to seek immediate medical attention should they occur. Patient verbalized understanding of these instructions and education.  Patient Consent Given: Yes Education handout provided: Previously provided Muscles treated: L scaleni; posterior cervical musculature Electrical stimulation performed: Yes Parameters: mAmp current; intensity adjusted to patient tolerance  Treatment response/outcome: decreased palpable tightness    Kinesotaping L shoulder Inhibition of upper trap  Anterior shoulder instability  Modalities: Moist heat L shoulder complex; cervical  [facility] Adult PT Treatment:                                                DATE: 05/05/2023 Therapeutic Exercise: Pulley  Flexion,  ab/add in scaption (PT holding upper trap to decrease scapular elevation) Horizontal ab/adduction with pulley scap squeeze  Standing  Scap  squeezes Shoulder pendulums Chest lift  Shoulder flexion stretch at counter (AAROM walk out/in) Prone  Shoulder extension 3 sec x 5 Axial extension with scap squeeze 3 sec x 5  Neuromuscular re-education:  Working with patient in sitting - improve smooth control and improve muscular activation with decreased use of L upper trap with active assistive shoulder elevation. Worked to facilitate posterior shoulder girdle musculature    Manual Therapy: Working on PROM and stretching with PT providing inhibition of upper trap for range into elevation. Soft tissue mobilization and trigger point work through the shoulder girdle musculature upper trap and leveator as well as periscapular musculature  Scapular mobilization Trigger Point Dry-Needling  Treatment instructions: Expect mild to moderate muscle soreness. S/S of pneumothorax if dry needled over a lung field, and to seek immediate medical attention should they occur. Patient verbalized understanding of these instructions and education.  Patient Consent Given: Yes Education handout provided: Previously provided Muscles treated: L posterior cervical musculature; upper trap Electrical stimulation performed: Yes Parameters: mAmp current; intensity adjusted to patient tolerance  Treatment response/outcome: decreased palpable tightness    Kinesotaping L shoulder Inhibition of upper trap  Anterior shoulder instability  Modalities: Moist heat L shoulder complex; cervical   PATIENT EDUCATION: Education details: POC; HEP Person educated: Patient Education method: Programmer, multimedia, Demonstration, Tactile cues, Verbal cues, and Handouts Education comprehension: verbalized understanding, returned demonstration, verbal cues required, tactile cues required, and needs further education  HOME EXERCISE PROGRAM:  (from treatment prior to surgery 03/14/23)  E-mailed to: [email or web address]   Access Code: [email or web address] URL: [email or web address] Date: 01/13/2023 Prepared by: Corlis Leak  Exercises - Seated Shoulder Flexion AAROM with Pulley Behind  - 2 x daily - 7 x weekly - 1 sets - 10 reps - 10 sec  hold - Seated Shoulder Scaption AAROM with Pulley at Side  - 2 x daily - 7 x weekly - 1 sets - 10 reps - 10sec  hold - Seated Cervical Retraction  - 2 x daily - 7 x weekly - 1-2 sets - 5-10 reps - 10 sec  hold - Seated Scapular Retraction  - 2 x daily - 7 x weekly - 1-2 sets - 10 reps - 10 sec  hold - Standing Scapular Retraction  - 3 x daily - 7 x weekly - 1 sets - 10 reps - 10 sec  hold - Shoulder External Rotation and Scapular Retraction  - 3 x daily -  7 x weekly - 1 sets - 10 reps - 3-5 sec   hold - Seated Shoulder W  - 2 x daily - 7 x weekly - 1 sets - 10 reps - 3 sec  hold - Shoulder External Rotation and Scapular Retraction with Resistance  - 2 x daily - 7 x weekly - 1 sets - 10 reps - 3-5 sec  hold - Shoulder W - External Rotation with Resistance  - 2 x daily - 7 x weekly - 1-2 sets - 10 reps - 3 sec  hold - Standing Bilateral Low Shoulder Row with Anchored Resistance  - 2 x daily - 7 x weekly - 1-3 sets - 10 reps - 2-3 sec  hold - Drawing Bow  - 1 x daily - 7 x weekly - 1 sets - 10 reps - 3 sec  hold - Prone Scapular Retraction  - 2 x daily - 7 x weekly - 1 sets - 5-10 reps - 3-5 sec  hold - Cat Cow  - 2 x daily - 7 x weekly - 1 sets - 5-10 reps - 3-5 sec  hold - Cat Cow to Child's Pose  - 2 x daily - 7 x weekly - 1 sets - 3 reps - 20-30 sec  hold  ASSESSMENT:  CLINICAL IMPRESSION: Patient continues to respond well to treatment and change in meds with 5 really good days since last visit. She notes overall decreased pain in L shoulder/UE pain. Kinesotaping has decreased over activation of upper trap. She continues to have some discomfort in the L cervical area but decreased palpable  tightness in the cervical and upper trap musculature. She is working on Engineer, petroleum re-ed exercises. Continued with DN and STM through the upper trap and posterior shoulder girdle musculature as well as kinesotaping to inhabit upper trap and improve scapular position.     04/21/23: ESI went well 04/20/23 and she can see improvement in the L UE symptoms. Manual work decreases muscular tightness and patient does tolerated PROM/stretching better. She continues to have tight but empty end feel. Note some improving muscular tightness through L shoulder girdle with persistent tightness in [facility] joint with decreased mobility and stability in the L scapulothoracic range. Patient is working on her exercises and ROM at home.    Re-eval: 03/15/23: patient returns post surgery 03/14/23 for L shoulder debridement; labral repair; biceps tendon release; removal of bone spurs. She presents with L UE in sling with bulky dressing L shoulder. Patient has some pain but this is minimal due to lasting effects of spinal block. She has poor posture and alignment; limited cervical and L UE mobility; limited functional activity tolerance with no lifting allowed L UE; pain on a constant basis. Patient will benefit from PT to address problems identified.    03/07/23: Patient reports continued pain which is variable in intensity. She was seen by MD and has had a cervical MRI but she does not have results and will not see MD until 03/17/23. Patient has continued radicular UE symptoms that seem cervical in nature. Shoulder ROM is decreased and patient is unable to tolerate PROM/stretching for L shoulder. We will continue with treatment as tolerated and patient schedules. She is continuing with meds and exercises as tolerated from HEP. She continues to have cervical involvement of L UE symptoms as well as shoulder pathology. She has positive ULTT and muscular tightness to palpation through the L cervical and upper trap/clavicular area. Responds  favorably to intervention through the  L cervical spine but decreased symptoms are temporary. Continue axial extension in supine and neural mobilization as pt tolerates. MRI shows arthritis in L shoulder but no RC pathology.   OBJECTIVE IMPAIRMENTS: Lt shoulder pain which started with lifting overhead her luggage when travelling. She has had gradually increasing pain and stiffness in the Lt shoulder. She received an injection ~ 1-2 weeks ago with some improvement. Patient presents with poor posture and alignment; limited A and PROM Lt shoulder; functional weakness due to pain; decreased functional activities and pain on a daily basis    GOALS: Goals reviewed with patient? Yes  SHORT TERM GOALS: Target date: 04/26/2023  Independent in initial HEP  Baseline: Goal status: INITIAL  2.  Increase AROM shoulder flexion and abduction to 100-110 degrees  Baseline:  Goal status: INITIAL   LONG TERM GOALS: Target date: 06/07/2023  Decrease pain in the Lt shoulder by 75-100% allowing patient to return to normal functional activities  Baseline:  Goal status: INITIAL  2.  AROM Lt shoulder WFL's and pain free  Baseline:  Goal status: INITIAL  3.  Patient reports return to normal functional activities including lifting grandson with minimal to no increase in Lt shoulder pain  Baseline:  Goal status: INITIAL  4.  Independent in HEP  Baseline:  Goal status: INITIAL  PLAN:  PT FREQUENCY: 2x/week  PT DURATION: 12 weeks  PLANNED INTERVENTIONS: Therapeutic exercises, Therapeutic activity, Neuromuscular re-education, Patient/Family education, Self Care, Joint mobilization, Aquatic Therapy, Dry Needling, Electrical stimulation, Spinal mobilization, Cryotherapy, Moist heat, Taping, Vasopneumatic device, Ultrasound, Ionotophoresis 4mg /ml Dexamethasone, Manual therapy, and Re-evaluation  PLAN FOR NEXT SESSION: review and progress exercises; continue with postural correction and education; manual  work, DN, modalities as indicated    [employer], PT 05/10/2023, 11:45 AM

## 2023-05-12 ENCOUNTER — Ambulatory Visit: Payer: BC Managed Care – PPO | Admitting: Rehabilitative and Restorative Service Providers"

## 2023-05-12 ENCOUNTER — Encounter: Payer: Self-pay | Admitting: Rehabilitative and Restorative Service Providers"

## 2023-05-12 DIAGNOSIS — F419 Anxiety disorder, unspecified: Secondary | ICD-10-CM | POA: Diagnosis not present

## 2023-05-12 DIAGNOSIS — R29898 Other symptoms and signs involving the musculoskeletal system: Secondary | ICD-10-CM

## 2023-05-12 DIAGNOSIS — R5383 Other fatigue: Secondary | ICD-10-CM | POA: Diagnosis not present

## 2023-05-12 DIAGNOSIS — M25512 Pain in left shoulder: Secondary | ICD-10-CM

## 2023-05-12 DIAGNOSIS — M6281 Muscle weakness (generalized): Secondary | ICD-10-CM

## 2023-05-12 DIAGNOSIS — M25511 Pain in right shoulder: Secondary | ICD-10-CM | POA: Diagnosis not present

## 2023-05-12 DIAGNOSIS — Z1329 Encounter for screening for other suspected endocrine disorder: Secondary | ICD-10-CM | POA: Diagnosis not present

## 2023-05-12 DIAGNOSIS — G8929 Other chronic pain: Secondary | ICD-10-CM | POA: Diagnosis not present

## 2023-05-12 DIAGNOSIS — R251 Tremor, unspecified: Secondary | ICD-10-CM | POA: Diagnosis not present

## 2023-05-12 NOTE — Therapy (Signed)
OUTPATIENT PHYSICAL THERAPY SHOULDER TREATMENT   Patient Name: Lori King MRN: [id number] DOB:05/06/1966, 57 y.o., female Today's Date: 05/12/2023  END OF SESSION:  PT End of Session - 05/12/23 1053     Visit Number 18    Number of Visits 24    Date for PT Re-Evaluation 06/07/23    Authorization Type [employer] [id number]    PT Start Time 1015    PT Stop Time 1104    PT Time Calculation (min) 49 min    Activity Tolerance Patient tolerated treatment well             Past Medical History:  Diagnosis Date   Adhesive capsulitis of right shoulder    Allergic rhinitis, seasonal    Anemia    Anxiety    MDD (major depressive disorder)    Wears contact lenses    Past Surgical History:  Procedure Laterality Date   BREAST BIOPSY Left 2010   x2 per pt benign   CESAREAN SECTION  X2  last one 02-28-2001   W/  BILATERAL TUBAL LIGATION w/ last c/s   COLONOSCOPY  11/01/2017   HYSTEROSCOPY W/ ENDOMETRIAL ABLATION  2019   LUMBAR LAMINECTOMY/DECOMPRESSION MICRODISCECTOMY  2013   L4  --- S1   SHOULDER ARTHROSCOPY Right 08/28/2020   Procedure: ARTHROSCOPY SHOULDER lysis of adhesions and manipulation under anesthesia, extensive debridement;  Surgeon: Yolonda Kida, MD;  Location: [facility];  Service: Orthopedics;  Laterality: Right;   There are no problems to display for this patient.   PCP: Dr Angelica Chessman  REFERRING PROVIDER: Dr Arlyce Harman  REFERRING DIAG: pain of Lt shoulder  THERAPY DIAG:  Acute pain of left shoulder  Muscle weakness (generalized)  Other symptoms and signs involving the musculoskeletal system  Chronic right shoulder pain  Rationale for Evaluation and Treatment: Rehabilitation  ONSET DATE: 10/24/22  SUBJECTIVE:                                                                                                                                                                                      SUBJECTIVE  STATEMENT: Patient reports that she has had a lot of soreness in the front and back of her shoulder since last visit. She has persistent tightness int L upper trap as well as increased tightness in the pecs. She has been working on her exercises. Tape helps with the shoulder muscular tightness.   03/15/23 RE-EVAL: Patient underwent L shoulder surgery with extensive debridement; labral repair; biceps tendon release; removal of bone spurs 03/14/23.  She has minimal pain due to remaining effects of spinal block for surgery. Bulky dressing limits movement and the  effect of ice. Remains in the sling.   Cervical MRI shows degenerative changes C5/6. MD will send order for cervical spine.   Patient reports continued pain in the L UE symptoms. She has stiffness and tightness in the L shoulder and the shoulder is tighter. She does not have the results of the MRI. She is scheduled to see the orthopedist, Dr Aundria Rud, tomorrow 03/17/23. She does not know results of neck MRI. [name] reports that she is taking gabapentin 3 times a day;  the muscle relaxants every 6 hours; finished a steroid dose pack; percocet as needed. She started the antiinflammatory medication this morning. She has less of the sharp pain and can tell the medication is helping. She received a steroid injection with [place]. Pain in the L neck and shoulder area continues. She has tingling and numbness in the ring and little fingers and the thumb and now in the palm of her hand on an intermittent basis. She is having difficulty getting comfortable to sleep.    EVAL: Patient reports that she was lifting her luggage out of the overhead bin when she felt pain in the Lt shoulder and has had some pain in an intermittent basis. She had an injection in Lt shoulder 12/22/22 with some improvement but shoulder is stiff and painful.  Hand dominance: Right  PERTINENT HISTORY: Rt shoulder pain 3 yrs with shoulder surgery for labrum tear and biceps tendon; adhesive  capsulitis; reactive hypoglycemic disease; fatty liver  PAIN:  Are you having pain? Yes: NPRS scale: 4/10 shoulder/biceps; in the night  5/10 (down to 2/10 over the weekend) Pain location: R shoulder/arm/shoulder blade area  Pain description: sharp; stabbing pain in biceps radiating to elbow with big reaching to the side  Aggravating factors: reaching overhead to the side and behind  Relieving factors: ice; TENs unit   PRECAUTIONS: None  WEIGHT BEARING RESTRICTIONS: No  FALLS:  Has patient fallen in last 6 months? No   PATIENT GOALS:get rid of the shoulder pain and increase ROM   NEXT MD VISIT: 03/29/23 (Dr Aundria Rud)   OBJECTIVE:   DIAGNOSTIC FINDINGS:  None available for shoulder; x-ray (-)   02/13/23 MRI Lt shoulder -  some joint space narrowing, subchondral sclerosis, subchondral cyst formation and osteophyte formation in the glenohumeral joint, indicative of osteoarthritis.  POSTURE: Patient presents with head forward posture with increased thoracic kyphosis; shoulders rounded and elevated; scapulae abducted and rotated along the thoracic spine; head of the humerus anterior in orientation.  CERVICAL ROM: 02/02/23 Flexion 43 tight pain Lt upper trap  Extension 48 tight ant/post neck R lateral flexion 36 tight L cervical  L lateral flexion 42 R rotation 71 L rotation 62 tight pain Lt cervical to upper trap    UPPER EXTREMITY ROM:   Active ROM Right eval Left eval Left  01/11/23 Left  02/02/23 Left(passive) 03/15/23 Left passive 03/29/23  AAROM  04/04/23 AAROM 05/05/23  Shoulder flexion 160 112 130 107 pain  80 125 138 148  Shoulder extension 59 25 48 31 pain  NT NT    Shoulder abduction (scaption) 168 112 133 92 pain  NT 84 92 128  Shoulder adduction          Shoulder internal rotation T7 Thumb to waist Thumb  T12 Hand lateral hip pain  NT neutral    Shoulder external rotation (in scapular plane) 95 85 93 45 pain  NT 70 75 75  Elbow flexion     120 WNL    Elbow  extension      0 WFL    Wrist flexion          Wrist extension          Wrist ulnar deviation          Wrist radial deviation          Wrist pronation          Wrist supination          (Blank rows = not tested) PASSIVE ROM - supine flexion 152; scaption 147; ER 90   AAROM/PROM supine - 04/19/23: PROM ~ 140 deg flexion; 92 deg abduction in scapular plane; 70 deg ER in scapular plane    UPPER EXTREMITY MMT: strength not assessed - moves Lt UE well against gravity; limited by pain  MMT Right eval Left eval  Shoulder flexion    Shoulder extension    Shoulder abduction    Shoulder adduction    Shoulder internal rotation    Shoulder external rotation    Middle trapezius    Lower trapezius    Elbow flexion    Elbow extension    Wrist flexion    Wrist extension    Wrist ulnar deviation    Wrist radial deviation    Wrist pronation    Wrist supination    Grip strength (lbs)    (Blank rows = not tested)  JOINT MOBILITY TESTING:  Significant joint tightness Lt shoulder in all planes  02/02/23: Significant joint tightness Lt shoulder in all planes 03/15/23: NT  UPPER LIMB TENSION TEST:  Positive L with patient unable to achieve 90 deg shoulder abduction for testing. UE in ~ 60 deg abduction with elbow extension produced radicular symptoms into Lt UE   PALPATION:  Muscular tightness Lt shoulder through the upper trap; pecs; leveator; teres 02/02/23: muscular tightness Lt shoulder girdle in upper trap; pecs; leveator; rhomboids; teres 02/09/23: muscular tightness Lt cervical and upper trap area through the shoulder girdle including pecs; leveator; rhomboids; teres 02/15/23: persistent tightness L cervical and upper trap area  03/15/23: muscular tightness L shoulder girdle   */26/24 - Observations:  Patient has poor movement quality with over activation of L upper trap with all active movement of L UE  [facility] Adult PT Treatment:                                                DATE:  05/12/2023 Therapeutic Exercise: Pulley  Flexion, ab/add in scaption (PT holding upper trap to decrease scapular elevation) Horizontal ab/adduction with pulley scap squeeze  Standing  Scap  squeezes Shoulder pendulums Chest lift  Shoulder flexion stretch at counter (AAROM walk out/in) Supine  T position for stretch through pecs and UE 10-20 sec x 3 Trunk rotation for stretch for pecs 20-30 sec x 4   Prone  Shoulder extension 3 sec x 5 Axial extension with scap squeeze 3 sec x 5  Neuromuscular re-education:  Working with patient in sitting - improve smooth control and improve muscular activation with decreased use of L upper trap with active assistive shoulder elevation. Worked to facilitate posterior shoulder girdle musculature    Manual Therapy: Working on PROM and stretching with PT providing inhibition of upper trap for range into elevation. Soft tissue mobilization and trigger point work through the shoulder girdle musculature upper trap and leveator as well as periscapular  musculature and pecs Scapular mobilization Trigger Point Dry-Needling  Treatment instructions: Expect mild to moderate muscle soreness. S/S of pneumothorax if dry needled over a lung field, and to seek immediate medical attention should they occur. Patient verbalized understanding of these instructions and education.  Patient Consent Given: Yes Education handout provided: Previously provided Muscles treated: L scaleni; posterior cervical musculature Electrical stimulation performed: Yes Parameters: mAmp current; intensity adjusted to patient tolerance  Treatment response/outcome: decreased palpable tightness    Kinesotaping L shoulder Inhibition of upper trap  Anterior shoulder instability  Modalities: Moist heat L shoulder complex; cervical  [facility] Adult PT Treatment:                                                DATE: 05/10/2023 Therapeutic Exercise: Pulley  Flexion, ab/add in scaption (PT holding  upper trap to decrease scapular elevation) Horizontal ab/adduction with pulley scap squeeze  Standing  Scap  squeezes Shoulder pendulums Chest lift  Shoulder flexion stretch at counter (AAROM walk out/in) Prone  Shoulder extension 3 sec x 5 Axial extension with scap squeeze 3 sec x 5  Neuromuscular re-education:  Working with patient in sitting - improve smooth control and improve muscular activation with decreased use of L upper trap with active assistive shoulder elevation. Worked to facilitate posterior shoulder girdle musculature    Manual Therapy: Working on PROM and stretching with PT providing inhibition of upper trap for range into elevation. Soft tissue mobilization and trigger point work through the shoulder girdle musculature upper trap and leveator as well as periscapular musculature  Scapular mobilization Trigger Point Dry-Needling  Treatment instructions: Expect mild to moderate muscle soreness. S/S of pneumothorax if dry needled over a lung field, and to seek immediate medical attention should they occur. Patient verbalized understanding of these instructions and education.  Patient Consent Given: Yes Education handout provided: Previously provided Muscles treated: L scaleni; posterior cervical musculature Electrical stimulation performed: Yes Parameters: mAmp current; intensity adjusted to patient tolerance  Treatment response/outcome: decreased palpable tightness    Kinesotaping L shoulder Inhibition of upper trap  Anterior shoulder instability  Modalities: Moist heat L shoulder complex; cervical  [facility] Adult PT Treatment:                                                DATE: 05/05/2023 Therapeutic Exercise: Pulley  Flexion, ab/add in scaption (PT holding upper trap to decrease scapular elevation) Horizontal ab/adduction with pulley scap squeeze  Standing  Scap  squeezes Shoulder pendulums Chest lift  Shoulder flexion stretch at counter (AAROM walk  out/in) Prone  Shoulder extension 3 sec x 5 Axial extension with scap squeeze 3 sec x 5  Neuromuscular re-education:  Working with patient in sitting - improve smooth control and improve muscular activation with decreased use of L upper trap with active assistive shoulder elevation. Worked to facilitate posterior shoulder girdle musculature    Manual Therapy: Working on PROM and stretching with PT providing inhibition of upper trap for range into elevation. Soft tissue mobilization and trigger point work through the shoulder girdle musculature upper trap and leveator as well as periscapular musculature  Scapular mobilization Trigger Point Dry-Needling  Treatment instructions: Expect mild to moderate muscle soreness. S/S of pneumothorax  if dry needled over a lung field, and to seek immediate medical attention should they occur. Patient verbalized understanding of these instructions and education.  Patient Consent Given: Yes Education handout provided: Previously provided Muscles treated: L posterior cervical musculature; upper trap Electrical stimulation performed: Yes Parameters: mAmp current; intensity adjusted to patient tolerance  Treatment response/outcome: decreased palpable tightness    Kinesotaping L shoulder Inhibition of upper trap  Anterior shoulder instability  Modalities: Moist heat L shoulder complex; cervical   PATIENT EDUCATION: Education details: POC; HEP Person educated: Patient Education method: Programmer, multimedia, Demonstration, Tactile cues, Verbal cues, and Handouts Education comprehension: verbalized understanding, returned demonstration, verbal cues required, tactile cues required, and needs further education  HOME EXERCISE PROGRAM: (from treatment prior to surgery 03/14/23)  E-mailed to: [email or web address]   Access Code: [email or web address] URL: [email or web address] Date: 01/13/2023 Prepared by: Corlis Leak  Exercises - Seated Shoulder Flexion AAROM  with Pulley Behind  - 2 x daily - 7 x weekly - 1 sets - 10 reps - 10 sec  hold - Seated Shoulder Scaption AAROM with Pulley at Side  - 2 x daily - 7 x weekly - 1 sets - 10 reps - 10sec  hold - Seated Cervical Retraction  - 2 x daily - 7 x weekly - 1-2 sets - 5-10 reps - 10 sec  hold - Seated Scapular Retraction  - 2 x daily - 7 x weekly - 1-2 sets - 10 reps - 10 sec  hold - Standing Scapular Retraction  - 3 x daily - 7 x weekly - 1 sets - 10 reps - 10 sec  hold - Shoulder External Rotation and Scapular Retraction  - 3 x daily - 7 x weekly - 1 sets - 10 reps - 3-5 sec   hold - Seated Shoulder W  - 2 x daily - 7 x weekly - 1 sets - 10 reps - 3 sec  hold - Shoulder External Rotation and Scapular Retraction with Resistance  - 2 x daily - 7 x weekly - 1 sets - 10 reps - 3-5 sec  hold - Shoulder W - External Rotation with Resistance  - 2 x daily - 7 x weekly - 1-2 sets - 10 reps - 3 sec  hold - Standing Bilateral Low Shoulder Row with Anchored Resistance  - 2 x daily - 7 x weekly - 1-3 sets - 10 reps - 2-3 sec  hold - Drawing Bow  - 1 x daily - 7 x weekly - 1 sets - 10 reps - 3 sec  hold - Prone Scapular Retraction  - 2 x daily - 7 x weekly - 1 sets - 5-10 reps - 3-5 sec  hold - Cat Cow  - 2 x daily - 7 x weekly - 1 sets - 5-10 reps - 3-5 sec  hold - Cat Cow to Child's Pose  - 2 x daily - 7 x weekly - 1 sets - 3 reps - 20-30 sec  hold  ASSESSMENT:  CLINICAL IMPRESSION: Patient presents with flare up of pain in the L shoulder in the past two days. Kinesotaping has decreased over activation of upper trap. She continues to have some discomfort in the L cervical area but decreased palpable tightness in the cervical and upper trap musculature. She is working on Engineer, petroleum re-ed exercises. Continued with DN and STM through the upper trap and posterior shoulder girdle musculature as well as kinesotaping to inhabit upper trap and improve scapular position.     04/21/23:  ESI went well 04/20/23 and she can see  improvement in the L UE symptoms. Manual work decreases muscular tightness and patient does tolerated PROM/stretching better. She continues to have tight but empty end feel. Note some improving muscular tightness through L shoulder girdle with persistent tightness in [facility] joint with decreased mobility and stability in the L scapulothoracic range. Patient is working on her exercises and ROM at home.    Re-eval: 03/15/23: patient returns post surgery 03/14/23 for L shoulder debridement; labral repair; biceps tendon release; removal of bone spurs. She presents with L UE in sling with bulky dressing L shoulder. Patient has some pain but this is minimal due to lasting effects of spinal block. She has poor posture and alignment; limited cervical and L UE mobility; limited functional activity tolerance with no lifting allowed L UE; pain on a constant basis. Patient will benefit from PT to address problems identified.    03/07/23: Patient reports continued pain which is variable in intensity. She was seen by MD and has had a cervical MRI but she does not have results and will not see MD until 03/17/23. Patient has continued radicular UE symptoms that seem cervical in nature. Shoulder ROM is decreased and patient is unable to tolerate PROM/stretching for L shoulder. We will continue with treatment as tolerated and patient schedules. She is continuing with meds and exercises as tolerated from HEP. She continues to have cervical involvement of L UE symptoms as well as shoulder pathology. She has positive ULTT and muscular tightness to palpation through the L cervical and upper trap/clavicular area. Responds favorably to intervention through the L cervical spine but decreased symptoms are temporary. Continue axial extension in supine and neural mobilization as pt tolerates. MRI shows arthritis in L shoulder but no RC pathology.   OBJECTIVE IMPAIRMENTS: Lt shoulder pain which started with lifting overhead her luggage when  travelling. She has had gradually increasing pain and stiffness in the Lt shoulder. She received an injection ~ 1-2 weeks ago with some improvement. Patient presents with poor posture and alignment; limited A and PROM Lt shoulder; functional weakness due to pain; decreased functional activities and pain on a daily basis    GOALS: Goals reviewed with patient? Yes  SHORT TERM GOALS: Target date: 04/26/2023  Independent in initial HEP  Baseline: Goal status: INITIAL  2.  Increase AROM shoulder flexion and abduction to 100-110 degrees  Baseline:  Goal status: INITIAL   LONG TERM GOALS: Target date: 06/07/2023  Decrease pain in the Lt shoulder by 75-100% allowing patient to return to normal functional activities  Baseline:  Goal status: INITIAL  2.  AROM Lt shoulder WFL's and pain free  Baseline:  Goal status: INITIAL  3.  Patient reports return to normal functional activities including lifting grandson with minimal to no increase in Lt shoulder pain  Baseline:  Goal status: INITIAL  4.  Independent in HEP  Baseline:  Goal status: INITIAL  PLAN:  PT FREQUENCY: 2x/week  PT DURATION: 12 weeks  PLANNED INTERVENTIONS: Therapeutic exercises, Therapeutic activity, Neuromuscular re-education, Patient/Family education, Self Care, Joint mobilization, Aquatic Therapy, Dry Needling, Electrical stimulation, Spinal mobilization, Cryotherapy, Moist heat, Taping, Vasopneumatic device, Ultrasound, Ionotophoresis 4mg /ml Dexamethasone, Manual therapy, and Re-evaluation  PLAN FOR NEXT SESSION: review and progress exercises; continue with postural correction and education; manual work, DN, modalities as indicated    [employer], PT 05/12/2023, 10:53 AM

## 2023-05-17 ENCOUNTER — Encounter: Payer: Self-pay | Admitting: Rehabilitative and Restorative Service Providers"

## 2023-05-17 ENCOUNTER — Ambulatory Visit: Payer: BC Managed Care – PPO | Admitting: Rehabilitative and Restorative Service Providers"

## 2023-05-17 DIAGNOSIS — R29898 Other symptoms and signs involving the musculoskeletal system: Secondary | ICD-10-CM

## 2023-05-17 DIAGNOSIS — G8929 Other chronic pain: Secondary | ICD-10-CM

## 2023-05-17 DIAGNOSIS — M25512 Pain in left shoulder: Secondary | ICD-10-CM | POA: Diagnosis not present

## 2023-05-17 DIAGNOSIS — M6281 Muscle weakness (generalized): Secondary | ICD-10-CM

## 2023-05-17 DIAGNOSIS — M25511 Pain in right shoulder: Secondary | ICD-10-CM | POA: Diagnosis not present

## 2023-05-17 NOTE — Therapy (Signed)
OUTPATIENT PHYSICAL THERAPY SHOULDER TREATMENT   Patient Name: Lori King MRN: [id number] DOB:1966/05/13, 57 y.o., female Today's Date: 05/17/2023  END OF SESSION:  PT End of Session - 05/17/23 1144     Visit Number 19    Number of Visits 24    Date for PT Re-Evaluation 06/07/23    Authorization Type [employer] [id number]    PT Start Time 1144    PT Stop Time 1234    PT Time Calculation (min) 50 min    Activity Tolerance Patient tolerated treatment well             Past Medical History:  Diagnosis Date   Adhesive capsulitis of right shoulder    Allergic rhinitis, seasonal    Anemia    Anxiety    MDD (major depressive disorder)    Wears contact lenses    Past Surgical History:  Procedure Laterality Date   BREAST BIOPSY Left 2010   x2 per pt benign   CESAREAN SECTION  X2  last one 02-28-2001   W/  BILATERAL TUBAL LIGATION w/ last c/s   COLONOSCOPY  11/01/2017   HYSTEROSCOPY W/ ENDOMETRIAL ABLATION  2019   LUMBAR LAMINECTOMY/DECOMPRESSION MICRODISCECTOMY  2013   L4  --- S1   SHOULDER ARTHROSCOPY Right 08/28/2020   Procedure: ARTHROSCOPY SHOULDER lysis of adhesions and manipulation under anesthesia, extensive debridement;  Surgeon: Yolonda Kida, MD;  Location: [facility];  Service: Orthopedics;  Laterality: Right;   There are no problems to display for this patient.   PCP: Dr Angelica Chessman  REFERRING PROVIDER: Dr Arlyce Harman  REFERRING DIAG: pain of Lt shoulder  THERAPY DIAG:  Acute pain of left shoulder  Muscle weakness (generalized)  Other symptoms and signs involving the musculoskeletal system  Chronic right shoulder pain  Rationale for Evaluation and Treatment: Rehabilitation  ONSET DATE: 10/24/22  SUBJECTIVE:                                                                                                                                                                                      SUBJECTIVE  STATEMENT: Patient reports that her shoulder is feeling better today and in the past few days. She is using the L UE for more functional activities. Notices some weakness in the L hand and fingers. She has persistent tightness int L upper trap as well as increased tightness in the pecs. Tape helps with the shoulder muscular tightness.   03/15/23 RE-EVAL: Patient underwent L shoulder surgery with extensive debridement; labral repair; biceps tendon release; removal of bone spurs 03/14/23.  She has minimal pain due to remaining effects of spinal block for surgery.  Bulky dressing limits movement and the effect of ice. Remains in the sling.   Cervical MRI shows degenerative changes C5/6. MD will send order for cervical spine.   Patient reports continued pain in the L UE symptoms. She has stiffness and tightness in the L shoulder and the shoulder is tighter. She does not have the results of the MRI. She is scheduled to see the orthopedist, Dr Aundria Rud, tomorrow 03/17/23. She does not know results of neck MRI. [name] reports that she is taking gabapentin 3 times a day;  the muscle relaxants every 6 hours; finished a steroid dose pack; percocet as needed. She started the antiinflammatory medication this morning. She has less of the sharp pain and can tell the medication is helping. She received a steroid injection with [place]. Pain in the L neck and shoulder area continues. She has tingling and numbness in the ring and little fingers and the thumb and now in the palm of her hand on an intermittent basis. She is having difficulty getting comfortable to sleep.    EVAL: Patient reports that she was lifting her luggage out of the overhead bin when she felt pain in the Lt shoulder and has had some pain in an intermittent basis. She had an injection in Lt shoulder 12/22/22 with some improvement but shoulder is stiff and painful.  Hand dominance: Right  PERTINENT HISTORY: Rt shoulder pain 3 yrs with shoulder surgery for labrum  tear and biceps tendon; adhesive capsulitis; reactive hypoglycemic disease; fatty liver  PAIN:  Are you having pain? Yes: NPRS scale: 4/10 shoulder/biceps; in the night  5/10 (down to 2/10 over the weekend) Pain location: R shoulder/arm/shoulder blade area  Pain description: sharp; stabbing pain in biceps radiating to elbow with big reaching to the side  Aggravating factors: reaching overhead to the side and behind  Relieving factors: ice; TENs unit   PRECAUTIONS: None  WEIGHT BEARING RESTRICTIONS: No  FALLS:  Has patient fallen in last 6 months? No   PATIENT GOALS:get rid of the shoulder pain and increase ROM   NEXT MD VISIT: 03/29/23 (Dr Aundria Rud)   OBJECTIVE:   DIAGNOSTIC FINDINGS:  None available for shoulder; x-ray (-)   02/13/23 MRI Lt shoulder -  some joint space narrowing, subchondral sclerosis, subchondral cyst formation and osteophyte formation in the glenohumeral joint, indicative of osteoarthritis.  POSTURE: Patient presents with head forward posture with increased thoracic kyphosis; shoulders rounded and elevated; scapulae abducted and rotated along the thoracic spine; head of the humerus anterior in orientation.  CERVICAL ROM: 02/02/23 Flexion 43 tight pain Lt upper trap  Extension 48 tight ant/post neck R lateral flexion 36 tight L cervical  L lateral flexion 42 R rotation 71 L rotation 62 tight pain Lt cervical to upper trap    UPPER EXTREMITY ROM:   Active ROM Right eval Left eval Left  01/11/23 Left  02/02/23 Left(passive) 03/15/23 Left passive 03/29/23  AAROM  04/04/23 AAROM 05/05/23  Shoulder flexion 160 112 130 107 pain  80 125 138 148  Shoulder extension 59 25 48 31 pain  NT NT    Shoulder abduction (scaption) 168 112 133 92 pain  NT 84 92 128  Shoulder adduction          Shoulder internal rotation T7 Thumb to waist Thumb  T12 Hand lateral hip pain  NT neutral    Shoulder external rotation (in scapular plane) 95 85 93 45 pain  NT 70 75 75  Elbow flexion  120 WNL    Elbow extension     0 WFL    Wrist flexion          Wrist extension          Wrist ulnar deviation          Wrist radial deviation          Wrist pronation          Wrist supination          (Blank rows = not tested) PASSIVE ROM - supine flexion 152; scaption 147; ER 90   AAROM/PROM supine - 04/19/23: PROM ~ 140 deg flexion; 92 deg abduction in scapular plane; 70 deg ER in scapular plane    UPPER EXTREMITY MMT: strength not assessed - moves Lt UE well against gravity; limited by pain  MMT Right eval Left eval  Shoulder flexion    Shoulder extension    Shoulder abduction    Shoulder adduction    Shoulder internal rotation    Shoulder external rotation    Middle trapezius    Lower trapezius    Elbow flexion    Elbow extension    Wrist flexion    Wrist extension    Wrist ulnar deviation    Wrist radial deviation    Wrist pronation    Wrist supination    Grip strength (lbs)    (Blank rows = not tested) 05/17/23:  Grip - R 37; L 18 Lateral pinch R 4.5; L 1.5 Three jaw: R 3.5; L .5      JOINT MOBILITY TESTING:  Significant joint tightness Lt shoulder in all planes  02/02/23: Significant joint tightness Lt shoulder in all planes 03/15/23: NT  UPPER LIMB TENSION TEST:  Positive L with patient unable to achieve 90 deg shoulder abduction for testing. UE in ~ 60 deg abduction with elbow extension produced radicular symptoms into Lt UE   PALPATION:  Muscular tightness Lt shoulder through the upper trap; pecs; leveator; teres 02/02/23: muscular tightness Lt shoulder girdle in upper trap; pecs; leveator; rhomboids; teres 02/09/23: muscular tightness Lt cervical and upper trap area through the shoulder girdle including pecs; leveator; rhomboids; teres 02/15/23: persistent tightness L cervical and upper trap area  03/15/23: muscular tightness L shoulder girdle   04/25/23 - Observations:  Patient has poor movement quality with over activation of L upper trap with all  active movement of L UE  [facility] Adult PT Treatment:                                                DATE: 05/17/2023 Therapeutic Exercise: Pulley  Flexion, ab/add in scaption (PT holding upper trap to decrease scapular elevation) Horizontal ab/adduction with pulley scap squeeze  Standing  Scap  squeezes Row isometric red TB 3 sec x 10  Row red TB 3 sec x 10 ER isometric red TB 3 sec x 10 IR isometric red TB 3 sec x 10  Chest lift/scap squeeze  Shoulder flexion stretch at wall Supine  T position for stretch through pecs and UE 10-20 sec x 3 Trunk rotation for stretch for pecs 20-30 sec x 4 Quadruped  Child's pose to stretch into shoulder flexion 20 sec x 3 Thread the needle 10 sec x 2 L  Prone  Shoulder extension 3 sec x 5 Axial extension with scap squeeze 3 sec x  5  Neuromuscular re-education:  Working with patient in sitting - improve smooth control and improve muscular activation with decreased use of L upper trap with active assistive shoulder elevation. Worked to facilitate posterior shoulder girdle musculature    Manual Therapy: Working on PROM and stretching with PT providing inhibition of upper trap for range into elevation. Soft tissue mobilization and trigger point work through the shoulder girdle musculature upper trap and leveator as well as periscapular musculature and pecs Scapular mobilization Trigger Point Dry-Needling  Treatment instructions: Expect mild to moderate muscle soreness. S/S of pneumothorax if dry needled over a lung field, and to seek immediate medical attention should they occur. Patient verbalized understanding of these instructions and education.  Patient Consent Given: Yes Education handout provided: Previously provided Muscles treated: L scaleni; posterior cervical musculature Electrical stimulation performed: Yes Parameters: mAmp current; intensity adjusted to patient tolerance  Treatment response/outcome: decreased palpable tightness     Kinesotaping L shoulder Inhibition of upper trap  Anterior shoulder instability  Modalities: Moist heat L shoulder complex; cervical  [facility] Adult PT Treatment:                                                DATE: 05/12/2023 Therapeutic Exercise: Pulley  Flexion, ab/add in scaption (PT holding upper trap to decrease scapular elevation) Horizontal ab/adduction with pulley scap squeeze  Standing  Scap  squeezes Shoulder pendulums Chest lift  Shoulder flexion stretch at counter (AAROM walk out/in) Supine  T position for stretch through pecs and UE 10-20 sec x 3 Trunk rotation for stretch for pecs 20-30 sec x 4   Prone  Shoulder extension 3 sec x 5 Axial extension with scap squeeze 3 sec x 5  Neuromuscular re-education:  Working with patient in sitting - improve smooth control and improve muscular activation with decreased use of L upper trap with active assistive shoulder elevation. Worked to facilitate posterior shoulder girdle musculature    Manual Therapy: Working on PROM and stretching with PT providing inhibition of upper trap for range into elevation. Soft tissue mobilization and trigger point work through the shoulder girdle musculature upper trap and leveator as well as periscapular musculature and pecs Scapular mobilization Trigger Point Dry-Needling  Treatment instructions: Expect mild to moderate muscle soreness. S/S of pneumothorax if dry needled over a lung field, and to seek immediate medical attention should they occur. Patient verbalized understanding of these instructions and education.  Patient Consent Given: Yes Education handout provided: Previously provided Muscles treated: L scaleni; posterior cervical musculature Electrical stimulation performed: Yes Parameters: mAmp current; intensity adjusted to patient tolerance  Treatment response/outcome: decreased palpable tightness    Kinesotaping L shoulder Inhibition of upper trap  Anterior shoulder  instability  Modalities: Moist heat L shoulder complex; cervical  [facility] Adult PT Treatment:                                                DATE: 05/10/2023 Therapeutic Exercise: Pulley  Flexion, ab/add in scaption (PT holding upper trap to decrease scapular elevation) Horizontal ab/adduction with pulley scap squeeze  Standing  Scap  squeezes Shoulder pendulums Chest lift  Shoulder flexion stretch at counter (AAROM walk out/in) Prone  Shoulder extension 3 sec x 5  Axial extension with scap squeeze 3 sec x 5  Neuromuscular re-education:  Working with patient in sitting - improve smooth control and improve muscular activation with decreased use of L upper trap with active assistive shoulder elevation. Worked to facilitate posterior shoulder girdle musculature    Manual Therapy: Working on PROM and stretching with PT providing inhibition of upper trap for range into elevation. Soft tissue mobilization and trigger point work through the shoulder girdle musculature upper trap and leveator as well as periscapular musculature  Scapular mobilization Trigger Point Dry-Needling  Treatment instructions: Expect mild to moderate muscle soreness. S/S of pneumothorax if dry needled over a lung field, and to seek immediate medical attention should they occur. Patient verbalized understanding of these instructions and education.  Patient Consent Given: Yes Education handout provided: Previously provided Muscles treated: L scaleni; posterior cervical musculature Electrical stimulation performed: Yes Parameters: mAmp current; intensity adjusted to patient tolerance  Treatment response/outcome: decreased palpable tightness    Kinesotaping L shoulder Inhibition of upper trap  Anterior shoulder instability  Modalities: Moist heat L shoulder complex; cervical   PATIENT EDUCATION: Education details: POC; HEP Person educated: Patient Education method: Programmer, multimedia, Demonstration, Tactile cues, Verbal  cues, and Handouts Education comprehension: verbalized understanding, returned demonstration, verbal cues required, tactile cues required, and needs further education  HOME EXERCISE PROGRAM: (from treatment prior to surgery 03/14/23)  E-mailed to: [email or web address]   Access Code: [email or web address] URL: [email or web address] Date: 01/13/2023 Prepared by: Corlis Leak  Exercises - Seated Shoulder Flexion AAROM with Pulley Behind  - 2 x daily - 7 x weekly - 1 sets - 10 reps - 10 sec  hold - Seated Shoulder Scaption AAROM with Pulley at Side  - 2 x daily - 7 x weekly - 1 sets - 10 reps - 10sec  hold - Seated Cervical Retraction  - 2 x daily - 7 x weekly - 1-2 sets - 5-10 reps - 10 sec  hold - Seated Scapular Retraction  - 2 x daily - 7 x weekly - 1-2 sets - 10 reps - 10 sec  hold - Standing Scapular Retraction  - 3 x daily - 7 x weekly - 1 sets - 10 reps - 10 sec  hold - Shoulder External Rotation and Scapular Retraction  - 3 x daily - 7 x weekly - 1 sets - 10 reps - 3-5 sec   hold - Seated Shoulder W  - 2 x daily - 7 x weekly - 1 sets - 10 reps - 3 sec  hold - Shoulder External Rotation and Scapular Retraction with Resistance  - 2 x daily - 7 x weekly - 1 sets - 10 reps - 3-5 sec  hold - Shoulder W - External Rotation with Resistance  - 2 x daily - 7 x weekly - 1-2 sets - 10 reps - 3 sec  hold - Standing Bilateral Low Shoulder Row with Anchored Resistance  - 2 x daily - 7 x weekly - 1-3 sets - 10 reps - 2-3 sec  hold - Drawing Bow  - 1 x daily - 7 x weekly - 1 sets - 10 reps - 3 sec  hold - Prone Scapular Retraction  - 2 x daily - 7 x weekly - 1 sets - 5-10 reps - 3-5 sec  hold - Cat Cow  - 2 x daily - 7 x weekly - 1 sets - 5-10 reps - 3-5 sec  hold - Cat Cow to Child's Pose  - 2 x daily -  7 x weekly - 1 sets - 3 reps - 20-30 sec  hold  ASSESSMENT:  CLINICAL IMPRESSION: Patient reports decreased pain in the L shoulder in the past few days. Notices weakness in L hand and fingers. Grip  and pinch testing reveals weakness in grip and pinch. Progressed resistive exercises and strengthening for grip and pinch. She continues to have some discomfort in the L cervical area but decreased palpable tightness in the cervical and upper trap musculature. She is working on Engineer, petroleum re-ed exercises. Continued with DN and STM through the upper trap and posterior shoulder girdle musculature as well as kinesotaping to inhabit upper trap and improve scapular position.     04/21/23: ESI went well 04/20/23 and she can see improvement in the L UE symptoms. Manual work decreases muscular tightness and patient does tolerated PROM/stretching better. She continues to have tight but empty end feel. Note some improving muscular tightness through L shoulder girdle with persistent tightness in [facility] joint with decreased mobility and stability in the L scapulothoracic range. Patient is working on her exercises and ROM at home.    Re-eval: 03/15/23: patient returns post surgery 03/14/23 for L shoulder debridement; labral repair; biceps tendon release; removal of bone spurs. She presents with L UE in sling with bulky dressing L shoulder. Patient has some pain but this is minimal due to lasting effects of spinal block. She has poor posture and alignment; limited cervical and L UE mobility; limited functional activity tolerance with no lifting allowed L UE; pain on a constant basis. Patient will benefit from PT to address problems identified.    03/07/23: Patient reports continued pain which is variable in intensity. She was seen by MD and has had a cervical MRI but she does not have results and will not see MD until 03/17/23. Patient has continued radicular UE symptoms that seem cervical in nature. Shoulder ROM is decreased and patient is unable to tolerate PROM/stretching for L shoulder. We will continue with treatment as tolerated and patient schedules. She is continuing with meds and exercises as tolerated from HEP. She  continues to have cervical involvement of L UE symptoms as well as shoulder pathology. She has positive ULTT and muscular tightness to palpation through the L cervical and upper trap/clavicular area. Responds favorably to intervention through the L cervical spine but decreased symptoms are temporary. Continue axial extension in supine and neural mobilization as pt tolerates. MRI shows arthritis in L shoulder but no RC pathology.   OBJECTIVE IMPAIRMENTS: Lt shoulder pain which started with lifting overhead her luggage when travelling. She has had gradually increasing pain and stiffness in the Lt shoulder. She received an injection ~ 1-2 weeks ago with some improvement. Patient presents with poor posture and alignment; limited A and PROM Lt shoulder; functional weakness due to pain; decreased functional activities and pain on a daily basis    GOALS: Goals reviewed with patient? Yes  SHORT TERM GOALS: Target date: 04/26/2023  Independent in initial HEP  Baseline: Goal status: INITIAL  2.  Increase AROM shoulder flexion and abduction to 100-110 degrees  Baseline:  Goal status: INITIAL   LONG TERM GOALS: Target date: 06/07/2023  Decrease pain in the Lt shoulder by 75-100% allowing patient to return to normal functional activities  Baseline:  Goal status: INITIAL  2.  AROM Lt shoulder WFL's and pain free  Baseline:  Goal status: INITIAL  3.  Patient reports return to normal functional activities including lifting grandson with minimal to no increase  in Lt shoulder pain  Baseline:  Goal status: INITIAL  4.  Independent in HEP  Baseline:  Goal status: INITIAL  PLAN:  PT FREQUENCY: 2x/week  PT DURATION: 12 weeks  PLANNED INTERVENTIONS: Therapeutic exercises, Therapeutic activity, Neuromuscular re-education, Patient/Family education, Self Care, Joint mobilization, Aquatic Therapy, Dry Needling, Electrical stimulation, Spinal mobilization, Cryotherapy, Moist heat, Taping, Vasopneumatic  device, Ultrasound, Ionotophoresis 4mg /ml Dexamethasone, Manual therapy, and Re-evaluation  PLAN FOR NEXT SESSION: review and progress exercises; continue with postural correction and education; manual work, DN, modalities as indicated    [employer], PT 05/17/2023, 11:45 AM

## 2023-05-19 ENCOUNTER — Encounter: Payer: BC Managed Care – PPO | Admitting: Rehabilitative and Restorative Service Providers"

## 2023-05-24 ENCOUNTER — Encounter: Payer: Self-pay | Admitting: Rehabilitative and Restorative Service Providers"

## 2023-05-24 ENCOUNTER — Ambulatory Visit: Payer: BC Managed Care – PPO | Admitting: Rehabilitative and Restorative Service Providers"

## 2023-05-24 DIAGNOSIS — M25511 Pain in right shoulder: Secondary | ICD-10-CM | POA: Diagnosis not present

## 2023-05-24 DIAGNOSIS — M25512 Pain in left shoulder: Secondary | ICD-10-CM

## 2023-05-24 DIAGNOSIS — R29898 Other symptoms and signs involving the musculoskeletal system: Secondary | ICD-10-CM

## 2023-05-24 DIAGNOSIS — M6281 Muscle weakness (generalized): Secondary | ICD-10-CM | POA: Diagnosis not present

## 2023-05-24 DIAGNOSIS — G8929 Other chronic pain: Secondary | ICD-10-CM | POA: Diagnosis not present

## 2023-05-24 NOTE — Therapy (Signed)
OUTPATIENT PHYSICAL THERAPY SHOULDER TREATMENT   Patient Name: Lori King MRN: [id number] DOB:1965-12-15, 57 y.o., female Today's Date: 05/24/2023  END OF SESSION:  PT End of Session - 05/24/23 1021     Visit Number 20    Number of Visits 24    Date for PT Re-Evaluation 06/07/23    Authorization Type [employer] [id number]    PT Start Time 1018    PT Stop Time 1108    PT Time Calculation (min) 50 min    Activity Tolerance Patient tolerated treatment well             Past Medical History:  Diagnosis Date   Adhesive capsulitis of right shoulder    Allergic rhinitis, seasonal    Anemia    Anxiety    MDD (major depressive disorder)    Wears contact lenses    Past Surgical History:  Procedure Laterality Date   BREAST BIOPSY Left 2010   x2 per pt benign   CESAREAN SECTION  X2  last one 02-28-2001   W/  BILATERAL TUBAL LIGATION w/ last c/s   COLONOSCOPY  11/01/2017   HYSTEROSCOPY W/ ENDOMETRIAL ABLATION  2019   LUMBAR LAMINECTOMY/DECOMPRESSION MICRODISCECTOMY  2013   L4  --- S1   SHOULDER ARTHROSCOPY Right 08/28/2020   Procedure: ARTHROSCOPY SHOULDER lysis of adhesions and manipulation under anesthesia, extensive debridement;  Surgeon: Lori Kida, MD;  Location: [facility];  Service: Orthopedics;  Laterality: Right;   There are no problems to display for this patient.   PCP: Dr Lori King  REFERRING PROVIDER: Dr Lori King  REFERRING DIAG: pain of Lt shoulder  THERAPY DIAG:  Acute pain of left shoulder  Muscle weakness (generalized)  Other symptoms and signs involving the musculoskeletal system  Chronic right shoulder pain  Rationale for Evaluation and Treatment: Rehabilitation  ONSET DATE: 10/24/22  SUBJECTIVE:                                                                                                                                                                                      SUBJECTIVE  STATEMENT: Patient reports that her shoulder is feeling better overall. She was out of town at [employer] over the weekend and did well. She took less medication and slept pretty well. Working on grip strengthening with some soreness. She continues to use the L UE for more functional activities. She has persistent tightness int L upper trap as well as increased tightness in the pecs. Tape helps with the shoulder muscular tightness.   03/15/23 RE-EVAL: Patient underwent L shoulder surgery with extensive debridement; labral repair; biceps tendon release; removal of bone spurs  03/14/23.  She has minimal pain due to remaining effects of spinal block for surgery. Bulky dressing limits movement and the effect of ice. Remains in the sling.   Cervical MRI shows degenerative changes C5/6. MD will send order for cervical spine.   Patient reports continued pain in the L UE symptoms. She has stiffness and tightness in the L shoulder and the shoulder is tighter. She does not have the results of the MRI. She is scheduled to see the orthopedist, Dr Lori King, tomorrow 03/17/23. She does not know results of neck MRI. Lori King reports that she is taking gabapentin 3 times a day;  the muscle relaxants every 6 hours; finished a steroid dose pack; percocet as needed. She started the antiinflammatory medication this morning. She has less of the sharp pain and can tell the medication is helping. She received a steroid injection with [place]. Pain in the L neck and shoulder area continues. She has tingling and numbness in the ring and little fingers and the thumb and now in the palm of her hand on an intermittent basis. She is having difficulty getting comfortable to sleep.    EVAL: Patient reports that she was lifting her luggage out of the overhead bin when she felt pain in the Lt shoulder and has had some pain in an intermittent basis. She had an injection in Lt shoulder 12/22/22 with some improvement but shoulder is stiff and painful.   Hand dominance: Right  PERTINENT HISTORY: Rt shoulder pain 3 yrs with shoulder surgery for labrum tear and biceps tendon; adhesive capsulitis; reactive hypoglycemic disease; fatty liver  PAIN:  Are you having pain? Yes: NPRS scale: 3/10 shoulder/biceps; in the night  5/10 (down to 2/10 over the weekend) Pain location: R shoulder/arm/shoulder blade area  Pain description: sharp; stabbing pain in biceps radiating to elbow with big reaching to the side  Aggravating factors: reaching overhead to the side and behind  Relieving factors: ice; TENs unit   PRECAUTIONS: None  WEIGHT BEARING RESTRICTIONS: No  FALLS:  Has patient fallen in last 6 months? No   PATIENT GOALS:get rid of the shoulder pain and increase ROM   NEXT MD VISIT: 03/29/23 (Dr Lori King)   OBJECTIVE:   DIAGNOSTIC FINDINGS:  None available for shoulder; x-ray (-)   02/13/23 MRI Lt shoulder -  some joint space narrowing, subchondral sclerosis, subchondral cyst formation and osteophyte formation in the glenohumeral joint, indicative of osteoarthritis.  POSTURE: Patient presents with head forward posture with increased thoracic kyphosis; shoulders rounded and elevated; scapulae abducted and rotated along the thoracic spine; head of the humerus anterior in orientation.  CERVICAL ROM: 02/02/23 Flexion 43 tight pain Lt upper trap  Extension 48 tight ant/post neck R lateral flexion 36 tight L cervical  L lateral flexion 42 R rotation 71 L rotation 62 tight pain Lt cervical to upper trap    UPPER EXTREMITY ROM:   Active ROM Right eval Left eval Left  01/11/23 Left  02/02/23 Left(passive) 03/15/23 Left passive 03/29/23  AAROM  04/04/23 AAROM 05/05/23  Shoulder flexion 160 112 130 107 pain  80 125 138 148  Shoulder extension 59 25 48 31 pain  NT NT    Shoulder abduction (scaption) 168 112 133 92 pain  NT 84 92 128  Shoulder adduction          Shoulder internal rotation T7 Thumb to waist Thumb  T12 Hand lateral hip pain  NT  neutral    Shoulder external rotation (in scapular plane) 95  85 93 45 pain  NT 70 75 75  Elbow flexion     120 WNL    Elbow extension     0 WFL    Wrist flexion          Wrist extension          Wrist ulnar deviation          Wrist radial deviation          Wrist pronation          Wrist supination          (Blank rows = not tested) PASSIVE ROM - supine flexion 152; scaption 147; ER 90   AAROM/PROM supine - 04/19/23: PROM ~ 140 deg flexion; 92 deg abduction in scapular plane; 70 deg ER in scapular plane    UPPER EXTREMITY MMT: strength not assessed - moves Lt UE well against gravity; limited by pain  MMT Right eval Left eval  Shoulder flexion    Shoulder extension    Shoulder abduction    Shoulder adduction    Shoulder internal rotation    Shoulder external rotation    Middle trapezius    Lower trapezius    Elbow flexion    Elbow extension    Wrist flexion    Wrist extension    Wrist ulnar deviation    Wrist radial deviation    Wrist pronation    Wrist supination    Grip strength (lbs)    (Blank rows = not tested) 05/17/23:  Grip - R 37; L 18 Lateral pinch R 4.5; L 1.5 Three jaw: R 3.5; L .5      JOINT MOBILITY TESTING:  Significant joint tightness Lt shoulder in all planes  02/02/23: Significant joint tightness Lt shoulder in all planes 03/15/23: NT  UPPER LIMB TENSION TEST:  Positive L with patient unable to achieve 90 deg shoulder abduction for testing. UE in ~ 60 deg abduction with elbow extension produced radicular symptoms into Lt UE   PALPATION:  Muscular tightness Lt shoulder through the upper trap; pecs; leveator; teres 02/02/23: muscular tightness Lt shoulder girdle in upper trap; pecs; leveator; rhomboids; teres 02/09/23: muscular tightness Lt cervical and upper trap area through the shoulder girdle including pecs; leveator; rhomboids; teres 02/15/23: persistent tightness L cervical and upper trap area  03/15/23: muscular tightness L shoulder girdle    04/25/23 - Observations:  Patient has poor movement quality with over activation of L upper trap with all active movement of L UE  [facility] Adult PT Treatment:                                                DATE: 05/24/2023 Therapeutic Exercise: Pulley  Flexion (PT holding upper trap to decrease scapular elevation last few reps) Horizontal ab/adduction with pulley scap squeeze  Standing  Scap  squeezes Row isometric red TB 3 sec x 10  Row red TB 3 sec x 10 ER isometric red TB 3 sec x 10 ER red TB 3 sec x 10  IR isometric red TB 3 sec x 10  IR red TB 3 sec x 10  Chest lift/scap squeeze  Corner stretch mid range position  Supine HEP T position for stretch through pecs and UE 10-20 sec x 3 Trunk rotation for stretch for pecs 20-30 sec x 4 Quadruped HEP  Child's pose  to stretch into shoulder flexion 20 sec x 3 Thread the needle 10 sec x 2 L  Prone HEP  Shoulder extension 3 sec x 5 Axial extension with scap squeeze 3 sec x 5  Neuromuscular re-education:  Patient working in sitting - improve smooth control and improve muscular activation with decreased use of L upper trap with active assistive shoulder elevation. Working to facilitate posterior shoulder girdle musculature    Manual Therapy: Working on PROM and stretching with PT providing inhibition of upper trap for range into elevation. Soft tissue mobilization and trigger point work through the shoulder girdle musculature upper trap and leveator as well as periscapular musculature and pecs Scapular mobilization Trigger Point Dry-Needling  Treatment instructions: Expect mild to moderate muscle soreness. S/S of pneumothorax if dry needled over a lung field, and to seek immediate medical attention should they occur. Patient verbalized understanding of these instructions and education.  Patient Consent Given: Yes Education handout provided: Previously provided Muscles treated: L scaleni; biceps tendon Electrical stimulation performed:  no  Parameters: n/a  Treatment response/outcome: decreased palpable tightness    Kinesotaping L shoulder (as needed) Inhibition of upper trap  Anterior shoulder instability  Modalities: ice L shoulder complex; cervical   [facility] Adult PT Treatment:                                                DATE: 05/17/2023 Therapeutic Exercise: Pulley  Flexion, ab/add in scaption (PT holding upper trap to decrease scapular elevation) Horizontal ab/adduction with pulley scap squeeze  Standing  Scap  squeezes Row isometric red TB 3 sec x 10  Row red TB 3 sec x 10 ER isometric red TB 3 sec x 10 IR isometric red TB 3 sec x 10  Chest lift/scap squeeze  Shoulder flexion stretch at wall Supine  T position for stretch through pecs and UE 10-20 sec x 3 Trunk rotation for stretch for pecs 20-30 sec x 4 Quadruped  Child's pose to stretch into shoulder flexion 20 sec x 3 Thread the needle 10 sec x 2 L  Prone  Shoulder extension 3 sec x 5 Axial extension with scap squeeze 3 sec x 5  Neuromuscular re-education:  Working with patient in sitting - improve smooth control and improve muscular activation with decreased use of L upper trap with active assistive shoulder elevation. Worked to facilitate posterior shoulder girdle musculature    Manual Therapy: Working on PROM and stretching with PT providing inhibition of upper trap for range into elevation. Soft tissue mobilization and trigger point work through the shoulder girdle musculature upper trap and leveator as well as periscapular musculature and pecs Scapular mobilization Trigger Point Dry-Needling  Treatment instructions: Expect mild to moderate muscle soreness. S/S of pneumothorax if dry needled over a lung field, and to seek immediate medical attention should they occur. Patient verbalized understanding of these instructions and education.  Patient Consent Given: Yes Education handout provided: Previously provided Muscles treated: L scaleni;  posterior cervical musculature Electrical stimulation performed: Yes Parameters: mAmp current; intensity adjusted to patient tolerance  Treatment response/outcome: decreased palpable tightness    Kinesotaping L shoulder Inhibition of upper trap  Anterior shoulder instability  Modalities: Moist heat L shoulder complex; cervical  PATIENT EDUCATION: Education details: POC; HEP Person educated: Patient Education method: Explanation, Demonstration, Tactile cues, Verbal cues, and Handouts Education comprehension: verbalized understanding,  returned demonstration, verbal cues required, tactile cues required, and needs further education  HOME EXERCISE PROGRAM: (from treatment prior to surgery 03/14/23)  E-mailed to: [email or web address]   Access Code: [email or web address] URL: [email or web address] Date: 01/13/2023 Prepared by: Corlis Leak  Exercises - Seated Shoulder Flexion AAROM with Pulley Behind  - 2 x daily - 7 x weekly - 1 sets - 10 reps - 10 sec  hold - Seated Shoulder Scaption AAROM with Pulley at Side  - 2 x daily - 7 x weekly - 1 sets - 10 reps - 10sec  hold - Seated Cervical Retraction  - 2 x daily - 7 x weekly - 1-2 sets - 5-10 reps - 10 sec  hold - Seated Scapular Retraction  - 2 x daily - 7 x weekly - 1-2 sets - 10 reps - 10 sec  hold - Standing Scapular Retraction  - 3 x daily - 7 x weekly - 1 sets - 10 reps - 10 sec  hold - Shoulder External Rotation and Scapular Retraction  - 3 x daily - 7 x weekly - 1 sets - 10 reps - 3-5 sec   hold - Seated Shoulder W  - 2 x daily - 7 x weekly - 1 sets - 10 reps - 3 sec  hold - Shoulder External Rotation and Scapular Retraction with Resistance  - 2 x daily - 7 x weekly - 1 sets - 10 reps - 3-5 sec  hold - Shoulder W - External Rotation with Resistance  - 2 x daily - 7 x weekly - 1-2 sets - 10 reps - 3 sec  hold - Standing Bilateral Low Shoulder Row with Anchored Resistance  - 2 x daily - 7 x weekly - 1-3 sets - 10 reps - 2-3 sec  hold -  Drawing Bow  - 1 x daily - 7 x weekly - 1 sets - 10 reps - 3 sec  hold - Prone Scapular Retraction  - 2 x daily - 7 x weekly - 1 sets - 5-10 reps - 3-5 sec  hold - Cat Cow  - 2 x daily - 7 x weekly - 1 sets - 5-10 reps - 3-5 sec  hold - Cat Cow to Child's Pose  - 2 x daily - 7 x weekly - 1 sets - 3 reps - 20-30 sec  hold  ASSESSMENT:  CLINICAL IMPRESSION: Patient reports decreased pain in the L shoulder in the past few days. She is working on L hand and finger exercises to address weakness in L hand and fingers. Progressed with strengthening L UE. Note decreased palpable tightness in the L cervical and upper trap musculature. She is working on Engineer, petroleum re-ed exercises at home. Continued with DN and STM through the upper trap and posterior shoulder girdle musculature.     04/21/23: ESI went well 04/20/23 and she can see improvement in the L UE symptoms. Manual work decreases muscular tightness and patient does tolerated PROM/stretching better. She continues to have tight but empty end feel. Note some improving muscular tightness through L shoulder girdle with persistent tightness in [facility] joint with decreased mobility and stability in the L scapulothoracic range. Patient is working on her exercises and ROM at home.    Re-eval: 03/15/23: patient returns post surgery 03/14/23 for L shoulder debridement; labral repair; biceps tendon release; removal of bone spurs. She presents with L UE in sling with bulky dressing L shoulder. Patient has some pain but this is minimal due to lasting effects  of spinal block. She has poor posture and alignment; limited cervical and L UE mobility; limited functional activity tolerance with no lifting allowed L UE; pain on a constant basis. Patient will benefit from PT to address problems identified.    03/07/23: Patient reports continued pain which is variable in intensity. She was seen by MD and has had a cervical MRI but she does not have results and will not see MD until  03/17/23. Patient has continued radicular UE symptoms that seem cervical in nature. Shoulder ROM is decreased and patient is unable to tolerate PROM/stretching for L shoulder. We will continue with treatment as tolerated and patient schedules. She is continuing with meds and exercises as tolerated from HEP. She continues to have cervical involvement of L UE symptoms as well as shoulder pathology. She has positive ULTT and muscular tightness to palpation through the L cervical and upper trap/clavicular area. Responds favorably to intervention through the L cervical spine but decreased symptoms are temporary. Continue axial extension in supine and neural mobilization as pt tolerates. MRI shows arthritis in L shoulder but no RC pathology.   OBJECTIVE IMPAIRMENTS: Lt shoulder pain which started with lifting overhead her luggage when travelling. She has had gradually increasing pain and stiffness in the Lt shoulder. She received an injection ~ 1-2 weeks ago with some improvement. Patient presents with poor posture and alignment; limited A and PROM Lt shoulder; functional weakness due to pain; decreased functional activities and pain on a daily basis    GOALS: Goals reviewed with patient? Yes  SHORT TERM GOALS: Target date: 04/26/2023  Independent in initial HEP  Baseline: Goal status: INITIAL  2.  Increase AROM shoulder flexion and abduction to 100-110 degrees  Baseline:  Goal status: INITIAL   LONG TERM GOALS: Target date: 06/07/2023  Decrease pain in the Lt shoulder by 75-100% allowing patient to return to normal functional activities  Baseline:  Goal status: INITIAL  2.  AROM Lt shoulder WFL's and pain free  Baseline:  Goal status: INITIAL  3.  Patient reports return to normal functional activities including lifting grandson with minimal to no increase in Lt shoulder pain  Baseline:  Goal status: INITIAL  4.  Independent in HEP  Baseline:  Goal status: INITIAL  PLAN:  PT FREQUENCY:  2x/week  PT DURATION: 12 weeks  PLANNED INTERVENTIONS: Therapeutic exercises, Therapeutic activity, Neuromuscular re-education, Patient/Family education, Self Care, Joint mobilization, Aquatic Therapy, Dry Needling, Electrical stimulation, Spinal mobilization, Cryotherapy, Moist heat, Taping, Vasopneumatic device, Ultrasound, Ionotophoresis 4mg /ml Dexamethasone, Manual therapy, and Re-evaluation  PLAN FOR NEXT SESSION: review and progress exercises; continue with postural correction and education; manual work, DN, modalities as indicated    [employer], PT 05/24/2023, 10:22 AM

## 2023-05-26 ENCOUNTER — Ambulatory Visit: Payer: BC Managed Care – PPO | Admitting: Rehabilitative and Restorative Service Providers"

## 2023-05-26 ENCOUNTER — Encounter: Payer: Self-pay | Admitting: Rehabilitative and Restorative Service Providers"

## 2023-05-26 DIAGNOSIS — M25512 Pain in left shoulder: Secondary | ICD-10-CM

## 2023-05-26 DIAGNOSIS — M6281 Muscle weakness (generalized): Secondary | ICD-10-CM

## 2023-05-26 DIAGNOSIS — R29898 Other symptoms and signs involving the musculoskeletal system: Secondary | ICD-10-CM

## 2023-05-26 DIAGNOSIS — G8929 Other chronic pain: Secondary | ICD-10-CM | POA: Diagnosis not present

## 2023-05-26 DIAGNOSIS — M25511 Pain in right shoulder: Secondary | ICD-10-CM | POA: Diagnosis not present

## 2023-05-26 NOTE — Therapy (Signed)
OUTPATIENT PHYSICAL THERAPY SHOULDER TREATMENT   Patient Name: Lori King MRN: [id number] DOB:[birth year], 57 y.o., female Today's Date: 05/26/2023  END OF SESSION:  PT End of Session - 05/26/23 1313     Visit Number 21    Number of Visits 24    Date for PT Re-Evaluation 06/07/23    Authorization Type [employer] [id number]    PT Start Time 1312    PT Stop Time 1401    PT Time Calculation (min) 49 min             Past Medical History:  Diagnosis Date   Adhesive capsulitis of right shoulder    Allergic rhinitis, seasonal    Anemia    Anxiety    MDD (major depressive disorder)    Wears contact lenses    Past Surgical History:  Procedure Laterality Date   BREAST BIOPSY Left 2010   x2 per pt benign   CESAREAN SECTION  X2  last one 02-28-2001   W/  BILATERAL TUBAL LIGATION w/ last c/s   COLONOSCOPY  11/01/2017   HYSTEROSCOPY W/ ENDOMETRIAL ABLATION  2019   LUMBAR LAMINECTOMY/DECOMPRESSION MICRODISCECTOMY  2013   L4  --- S1   SHOULDER ARTHROSCOPY Right 08/28/2020   Procedure: ARTHROSCOPY SHOULDER lysis of adhesions and manipulation under anesthesia, extensive debridement;  Surgeon: Yolonda Kida, MD;  Location: [facility];  Service: Orthopedics;  Laterality: Right;   There are no problems to display for this patient.   PCP: Dr Angelica Chessman  REFERRING PROVIDER: Dr Arlyce Harman  REFERRING DIAG: pain of Lt shoulder  THERAPY DIAG:  Acute pain of left shoulder  Muscle weakness (generalized)  Other symptoms and signs involving the musculoskeletal system  Chronic right shoulder pain  Rationale for Evaluation and Treatment: Rehabilitation  ONSET DATE: 10/24/22  SUBJECTIVE:                                                                                                                                                                                      SUBJECTIVE STATEMENT: Patient reports that her shoulder continues with less pain and  increased movement. She is taking less medication and sleeping better. She continues to use the L UE for more functional activities. She has persistent tightness int L upper trap as well as increased tightness in the pecs.    03/15/23 RE-EVAL: Patient underwent L shoulder surgery with extensive debridement; labral repair; biceps tendon release; removal of bone spurs 03/14/23.  She has minimal pain due to remaining effects of spinal block for surgery. Bulky dressing limits movement and the effect of ice. Remains in the sling.   Cervical MRI shows  degenerative changes C5/6. MD will send order for cervical spine.   Patient reports continued pain in the L UE symptoms. She has stiffness and tightness in the L shoulder and the shoulder is tighter. She does not have the results of the MRI. She is scheduled to see the orthopedist, Dr Aundria Rud, tomorrow 03/17/23. She does not know results of neck MRI. [name] reports that she is taking gabapentin 3 times a day;  the muscle relaxants every 6 hours; finished a steroid dose pack; percocet as needed. She started the antiinflammatory medication this morning. She has less of the sharp pain and can tell the medication is helping. She received a steroid injection with [place]. Pain in the L neck and shoulder area continues. She has tingling and numbness in the ring and little fingers and the thumb and now in the palm of her hand on an intermittent basis. She is having difficulty getting comfortable to sleep.    EVAL: Patient reports that she was lifting her luggage out of the overhead bin when she felt pain in the Lt shoulder and has had some pain in an intermittent basis. She had an injection in Lt shoulder 12/22/22 with some improvement but shoulder is stiff and painful.  Hand dominance: Right  PERTINENT HISTORY: Rt shoulder pain 3 yrs with shoulder surgery for labrum tear and biceps tendon; adhesive capsulitis; reactive hypoglycemic disease; fatty liver  PAIN:  Are you  having pain? Yes: NPRS scale: 3/10 shoulder/biceps; in the night  5/10 (down to 2/10 over the weekend) Pain location: R shoulder/arm/shoulder blade area  Pain description: sharp; stabbing pain in biceps radiating to elbow with big reaching to the side  Aggravating factors: reaching overhead to the side and behind  Relieving factors: ice; TENs unit   PRECAUTIONS: None  WEIGHT BEARING RESTRICTIONS: No  FALLS:  Has patient fallen in last 6 months? No   PATIENT GOALS:get rid of the shoulder pain and increase ROM   NEXT MD VISIT: 03/29/23 (Dr Aundria Rud)   OBJECTIVE:   DIAGNOSTIC FINDINGS:  None available for shoulder; x-ray (-)   02/13/23 MRI Lt shoulder -  some joint space narrowing, subchondral sclerosis, subchondral cyst formation and osteophyte formation in the glenohumeral joint, indicative of osteoarthritis.  POSTURE: Patient presents with head forward posture with increased thoracic kyphosis; shoulders rounded and elevated; scapulae abducted and rotated along the thoracic spine; head of the humerus anterior in orientation.  CERVICAL ROM: 02/02/23 Flexion 43 tight pain Lt upper trap  Extension 48 tight ant/post neck R lateral flexion 36 tight L cervical  L lateral flexion 42 R rotation 71 L rotation 62 tight pain Lt cervical to upper trap    UPPER EXTREMITY ROM:   Active ROM Right eval Left eval Left  01/11/23 Left  02/02/23 Left(passive) 03/15/23 Left passive 03/29/23  AAROM  04/04/23 AAROM 05/05/23  Shoulder flexion 160 112 130 107 pain  80 125 138 148  Shoulder extension 59 25 48 31 pain  NT NT    Shoulder abduction (scaption) 168 112 133 92 pain  NT 84 92 128  Shoulder adduction          Shoulder internal rotation T7 Thumb to waist Thumb  T12 Hand lateral hip pain  NT neutral    Shoulder external rotation (in scapular plane) 95 85 93 45 pain  NT 70 75 75  Elbow flexion     120 WNL    Elbow extension     0 WFL    Wrist flexion  Wrist extension          Wrist  ulnar deviation          Wrist radial deviation          Wrist pronation          Wrist supination          (Blank rows = not tested) PASSIVE ROM - supine flexion 152; scaption 147; ER 90   AAROM/PROM supine - 04/19/23: PROM ~ 140 deg flexion; 92 deg abduction in scapular plane; 70 deg ER in scapular plane    UPPER EXTREMITY MMT: strength not assessed - moves Lt UE well against gravity; limited by pain  MMT Right eval Left eval  Shoulder flexion    Shoulder extension    Shoulder abduction    Shoulder adduction    Shoulder internal rotation    Shoulder external rotation    Middle trapezius    Lower trapezius    Elbow flexion    Elbow extension    Wrist flexion    Wrist extension    Wrist ulnar deviation    Wrist radial deviation    Wrist pronation    Wrist supination    Grip strength (lbs)    (Blank rows = not tested) 05/17/23:  Grip - R 37; L 18 Lateral pinch R 4.5; L 1.5 Three jaw: R 3.5; L .5     05/26/23:  Grip - R 37; L 26 Lateral pinch R 4.5; L 2 Three jaw: R 3.5; L .6      JOINT MOBILITY TESTING:  Significant joint tightness Lt shoulder in all planes  02/02/23: Significant joint tightness Lt shoulder in all planes 03/15/23: NT  UPPER LIMB TENSION TEST:  Positive L with patient unable to achieve 90 deg shoulder abduction for testing. UE in ~ 60 deg abduction with elbow extension produced radicular symptoms into Lt UE   PALPATION:  Muscular tightness Lt shoulder through the upper trap; pecs; leveator; teres 02/02/23: muscular tightness Lt shoulder girdle in upper trap; pecs; leveator; rhomboids; teres 02/09/23: muscular tightness Lt cervical and upper trap area through the shoulder girdle including pecs; leveator; rhomboids; teres 02/15/23: persistent tightness L cervical and upper trap area  03/15/23: muscular tightness L shoulder girdle   04/25/23 - Observations:  Patient has poor movement quality with over activation of L upper trap with all active movement of L  UE  [facility] Adult PT Treatment:                                                DATE: 05/24/2023 Therapeutic Exercise: Pulley  Flexion (PT holding upper trap to decrease scapular elevation last few reps) Horizontal ab/adduction with pulley scap squeeze  Standing  Scap  squeezes Row isometric red TB 3 sec x 10  Row red TB 3 sec x 10 ER isometric red TB 3 sec x 10 ER red TB 3 sec x 10  IR isometric red TB 3 sec x 10  IR red TB 3 sec x 10  Chest lift/scap squeeze  Corner stretch low,mid, high range position  Supine HEP T position for stretch through pecs and UE 10-20 sec x 3 Trunk rotation for stretch for pecs 20-30 sec x 4 Quadruped HEP  Cat cow x 5  Child's pose to stretch into shoulder flexion 20 sec x 3 Thread the needle  10 sec x 2 L  Prone  Shoulder extension 3 sec x 2 Axial extension with scap squeeze 3 sec x 6  Neuromuscular re-education:  Patient working in sitting - improve smooth control and improve muscular activation with decreased use of L upper trap with active assistive shoulder elevation. Working to facilitate posterior shoulder girdle musculature    Manual Therapy: Working on PROM and stretching with PT providing inhibition of upper trap for range into elevation. Soft tissue mobilization and trigger point work through the shoulder girdle musculature upper trap and leveator as well as periscapular musculature and pecs Scapular mobilization Trigger Point Dry-Needling  Treatment instructions: Expect mild to moderate muscle soreness. S/S of pneumothorax if dry needled over a lung field, and to seek immediate medical attention should they occur. Patient verbalized understanding of these instructions and education.  Patient Consent Given: Yes Education handout provided: Previously provided Muscles treated: L cervical scaleni/paraspinal area  Electrical stimulation performed: no  Parameters: n/a  Treatment response/outcome: decreased palpable tightness    Kinesotaping  L shoulder (as needed) Inhibition of upper trap  Anterior shoulder instability  Modalities: MH L shoulder complex; cervical   [facility] Adult PT Treatment:                                                DATE: 05/17/2023 Therapeutic Exercise: Pulley  Flexion, ab/add in scaption (PT holding upper trap to decrease scapular elevation) Horizontal ab/adduction with pulley scap squeeze  Standing  Scap  squeezes Row isometric red TB 3 sec x 10  Row red TB 3 sec x 10 ER isometric red TB 3 sec x 10 IR isometric red TB 3 sec x 10  Chest lift/scap squeeze  Shoulder flexion stretch at wall Supine  T position for stretch through pecs and UE 10-20 sec x 3 Trunk rotation for stretch for pecs 20-30 sec x 4 Quadruped  Child's pose to stretch into shoulder flexion 20 sec x 3 Thread the needle 10 sec x 2 L  Prone  Shoulder extension 3 sec x 5 Axial extension with scap squeeze 3 sec x 5  Neuromuscular re-education:  Working with patient in sitting - improve smooth control and improve muscular activation with decreased use of L upper trap with active assistive shoulder elevation. Worked to facilitate posterior shoulder girdle musculature    Manual Therapy: Working on PROM and stretching with PT providing inhibition of upper trap for range into elevation. Soft tissue mobilization and trigger point work through the shoulder girdle musculature upper trap and leveator as well as periscapular musculature and pecs Scapular mobilization Trigger Point Dry-Needling  Treatment instructions: Expect mild to moderate muscle soreness. S/S of pneumothorax if dry needled over a lung field, and to seek immediate medical attention should they occur. Patient verbalized understanding of these instructions and education.  Patient Consent Given: Yes Education handout provided: Previously provided Muscles treated: L scaleni; posterior cervical musculature Electrical stimulation performed: Yes Parameters: mAmp current;  intensity adjusted to patient tolerance  Treatment response/outcome: decreased palpable tightness    Kinesotaping L shoulder Inhibition of upper trap  Anterior shoulder instability  Modalities: Moist heat L shoulder complex; cervical  PATIENT EDUCATION: Education details: POC; HEP Person educated: Patient Education method: Programmer, multimedia, Demonstration, Tactile cues, Verbal cues, and Handouts Education comprehension: verbalized understanding, returned demonstration, verbal cues required, tactile cues required, and needs further education  HOME EXERCISE PROGRAM: (from treatment prior to surgery 03/14/23)  E-mailed to: [email or web address]   Access Code: [email or web address] URL: [email or web address] Date: 01/13/2023 Prepared by: Corlis Leak  Exercises - Seated Shoulder Flexion AAROM with Pulley Behind  - 2 x daily - 7 x weekly - 1 sets - 10 reps - 10 sec  hold - Seated Shoulder Scaption AAROM with Pulley at Side  - 2 x daily - 7 x weekly - 1 sets - 10 reps - 10sec  hold - Seated Cervical Retraction  - 2 x daily - 7 x weekly - 1-2 sets - 5-10 reps - 10 sec  hold - Seated Scapular Retraction  - 2 x daily - 7 x weekly - 1-2 sets - 10 reps - 10 sec  hold - Standing Scapular Retraction  - 3 x daily - 7 x weekly - 1 sets - 10 reps - 10 sec  hold - Shoulder External Rotation and Scapular Retraction  - 3 x daily - 7 x weekly - 1 sets - 10 reps - 3-5 sec   hold - Seated Shoulder W  - 2 x daily - 7 x weekly - 1 sets - 10 reps - 3 sec  hold - Shoulder External Rotation and Scapular Retraction with Resistance  - 2 x daily - 7 x weekly - 1 sets - 10 reps - 3-5 sec  hold - Shoulder W - External Rotation with Resistance  - 2 x daily - 7 x weekly - 1-2 sets - 10 reps - 3 sec  hold - Standing Bilateral Low Shoulder Row with Anchored Resistance  - 2 x daily - 7 x weekly - 1-3 sets - 10 reps - 2-3 sec  hold - Drawing Bow  - 1 x daily - 7 x weekly - 1 sets - 10 reps - 3 sec  hold - Prone Scapular  Retraction  - 2 x daily - 7 x weekly - 1 sets - 5-10 reps - 3-5 sec  hold - Cat Cow  - 2 x daily - 7 x weekly - 1 sets - 5-10 reps - 3-5 sec  hold - Cat Cow to Child's Pose  - 2 x daily - 7 x weekly - 1 sets - 3 reps - 20-30 sec  hold  ASSESSMENT:  CLINICAL IMPRESSION: Patient reports decreased pain in the L shoulder, aking less medication and sleeping better. She is working on L hand and finger exercises to address weakness in L hand and fingers. Note increase in grip strength with measurement today.  Progressed with strengthening L UE. Note decreased palpable tightness in the L cervical and upper trap musculature. She is working on Engineer, petroleum re-ed exercises at home. Continued with DN and STM through the upper trap and posterior shoulder girdle musculature.     04/21/23: ESI went well 04/20/23 and she can see improvement in the L UE symptoms. Manual work decreases muscular tightness and patient does tolerated PROM/stretching better. She continues to have tight but empty end feel. Note some improving muscular tightness through L shoulder girdle with persistent tightness in [employer] joint with decreased mobility and stability in the L scapulothoracic range. Patient is working on her exercises and ROM at home.    Re-eval: 03/15/23: patient returns post surgery 03/14/23 for L shoulder debridement; labral repair; biceps tendon release; removal of bone spurs. She presents with L UE in sling with bulky dressing L shoulder. Patient has some pain but this is minimal due to lasting effects of spinal block.  She has poor posture and alignment; limited cervical and L UE mobility; limited functional activity tolerance with no lifting allowed L UE; pain on a constant basis. Patient will benefit from PT to address problems identified.    03/07/23: Patient reports continued pain which is variable in intensity. She was seen by MD and has had a cervical MRI but she does not have results and will not see MD until 03/17/23.  Patient has continued radicular UE symptoms that seem cervical in nature. Shoulder ROM is decreased and patient is unable to tolerate PROM/stretching for L shoulder. We will continue with treatment as tolerated and patient schedules. She is continuing with meds and exercises as tolerated from HEP. She continues to have cervical involvement of L UE symptoms as well as shoulder pathology. She has positive ULTT and muscular tightness to palpation through the L cervical and upper trap/clavicular area. Responds favorably to intervention through the L cervical spine but decreased symptoms are temporary. Continue axial extension in supine and neural mobilization as pt tolerates. MRI shows arthritis in L shoulder but no RC pathology.   OBJECTIVE IMPAIRMENTS: Lt shoulder pain which started with lifting overhead her luggage when travelling. She has had gradually increasing pain and stiffness in the Lt shoulder. She received an injection ~ 1-2 weeks ago with some improvement. Patient presents with poor posture and alignment; limited A and PROM Lt shoulder; functional weakness due to pain; decreased functional activities and pain on a daily basis    GOALS: Goals reviewed with patient? Yes  SHORT TERM GOALS: Target date: 04/26/2023  Independent in initial HEP  Baseline: Goal status: INITIAL  2.  Increase AROM shoulder flexion and abduction to 100-110 degrees  Baseline:  Goal status: INITIAL   LONG TERM GOALS: Target date: 06/07/2023  Decrease pain in the Lt shoulder by 75-100% allowing patient to return to normal functional activities  Baseline:  Goal status: INITIAL  2.  AROM Lt shoulder WFL's and pain free  Baseline:  Goal status: INITIAL  3.  Patient reports return to normal functional activities including lifting grandson with minimal to no increase in Lt shoulder pain  Baseline:  Goal status: INITIAL  4.  Independent in HEP  Baseline:  Goal status: INITIAL  PLAN:  PT FREQUENCY:  2x/week  PT DURATION: 12 weeks  PLANNED INTERVENTIONS: Therapeutic exercises, Therapeutic activity, Neuromuscular re-education, Patient/Family education, Self Care, Joint mobilization, Aquatic Therapy, Dry Needling, Electrical stimulation, Spinal mobilization, Cryotherapy, Moist heat, Taping, Vasopneumatic device, Ultrasound, Ionotophoresis 4mg /ml Dexamethasone, Manual therapy, and Re-evaluation  PLAN FOR NEXT SESSION: review and progress exercises; continue with postural correction and education; manual work, DN, modalities as indicated    [employer], PT 05/26/2023, 1:40 PM

## 2023-05-30 ENCOUNTER — Ambulatory Visit: Payer: BC Managed Care – PPO | Admitting: Rehabilitative and Restorative Service Providers"

## 2023-05-30 ENCOUNTER — Encounter: Payer: Self-pay | Admitting: Rehabilitative and Restorative Service Providers"

## 2023-05-30 DIAGNOSIS — G8929 Other chronic pain: Secondary | ICD-10-CM

## 2023-05-30 DIAGNOSIS — M25512 Pain in left shoulder: Secondary | ICD-10-CM

## 2023-05-30 DIAGNOSIS — R29898 Other symptoms and signs involving the musculoskeletal system: Secondary | ICD-10-CM

## 2023-05-30 DIAGNOSIS — M25511 Pain in right shoulder: Secondary | ICD-10-CM | POA: Diagnosis not present

## 2023-05-30 DIAGNOSIS — M6281 Muscle weakness (generalized): Secondary | ICD-10-CM

## 2023-05-30 NOTE — Therapy (Signed)
OUTPATIENT PHYSICAL THERAPY SHOULDER TREATMENT   Patient Name: Lori King MRN: [id number] DOB:06-04-1966, 57 y.o., female Today's Date: 05/30/2023  END OF SESSION:  PT End of Session - 05/30/23 1019     Visit Number 22    Number of Visits 24    Date for PT Re-Evaluation 06/07/23    Authorization Type [employer] [id number]    PT Start Time 1015    PT Stop Time 1104    PT Time Calculation (min) 49 min    Activity Tolerance Patient tolerated treatment well             Past Medical History:  Diagnosis Date   Adhesive capsulitis of right shoulder    Allergic rhinitis, seasonal    Anemia    Anxiety    MDD (major depressive disorder)    Wears contact lenses    Past Surgical History:  Procedure Laterality Date   BREAST BIOPSY Left 2010   x2 per pt benign   CESAREAN SECTION  X2  last one 02-28-2001   W/  BILATERAL TUBAL LIGATION w/ last c/s   COLONOSCOPY  11/01/2017   HYSTEROSCOPY W/ ENDOMETRIAL ABLATION  2019   LUMBAR LAMINECTOMY/DECOMPRESSION MICRODISCECTOMY  2013   L4  --- S1   SHOULDER ARTHROSCOPY Right 08/28/2020   Procedure: ARTHROSCOPY SHOULDER lysis of adhesions and manipulation under anesthesia, extensive debridement;  Surgeon: Yolonda Kida, MD;  Location: [facility];  Service: Orthopedics;  Laterality: Right;   There are no problems to display for this patient.   PCP: Dr Angelica Chessman  REFERRING PROVIDER: Dr Arlyce Harman  REFERRING DIAG: pain of Lt shoulder  THERAPY DIAG:  Acute pain of left shoulder  Muscle weakness (generalized)  Other symptoms and signs involving the musculoskeletal system  Chronic right shoulder pain  Rationale for Evaluation and Treatment: Rehabilitation  ONSET DATE: 10/24/22  SUBJECTIVE:                                                                                                                                                                                      SUBJECTIVE  STATEMENT: Patient reports that her shoulder continues to improve with less pain and increased movement. She is taking less medication and sleeping better. She continues to use the L UE for more functional activities. She has persistent tightness int L cervical area as well as increased tightness in the pecs.    03/15/23 RE-EVAL: Patient underwent L shoulder surgery with extensive debridement; labral repair; biceps tendon release; removal of bone spurs 03/14/23.  She has minimal pain due to remaining effects of spinal block for surgery. Bulky dressing limits movement and the effect  of ice. Remains in the sling.   Cervical MRI shows degenerative changes C5/6. MD will send order for cervical spine.   Patient reports continued pain in the L UE symptoms. She has stiffness and tightness in the L shoulder and the shoulder is tighter. She does not have the results of the MRI. She is scheduled to see the orthopedist, Dr Aundria Rud, tomorrow 03/17/23. She does not know results of neck MRI. [name] reports that she is taking gabapentin 3 times a day;  the muscle relaxants every 6 hours; finished a steroid dose pack; percocet as needed. She started the antiinflammatory medication this morning. She has less of the sharp pain and can tell the medication is helping. She received a steroid injection with [place]. Pain in the L neck and shoulder area continues. She has tingling and numbness in the ring and little fingers and the thumb and now in the palm of her hand on an intermittent basis. She is having difficulty getting comfortable to sleep.    EVAL: Patient reports that she was lifting her luggage out of the overhead bin when she felt pain in the Lt shoulder and has had some pain in an intermittent basis. She had an injection in Lt shoulder 12/22/22 with some improvement but shoulder is stiff and painful.  Hand dominance: Right  PERTINENT HISTORY: Rt shoulder pain 3 yrs with shoulder surgery for labrum tear and biceps tendon;  adhesive capsulitis; reactive hypoglycemic disease; fatty liver  PAIN:  Are you having pain? Yes: NPRS scale: 1/10 shoulder/biceps Pain location: R shoulder/arm/shoulder blade area  Pain description: sharp; stabbing pain in biceps radiating to elbow with big reaching to the side  Aggravating factors: reaching overhead to the side and behind  Relieving factors: ice; TENs unit   PRECAUTIONS: None  WEIGHT BEARING RESTRICTIONS: No  FALLS:  Has patient fallen in last 6 months? No   PATIENT GOALS:get rid of the shoulder pain and increase ROM   NEXT MD VISIT: 03/29/23 (Dr Aundria Rud)   OBJECTIVE:   DIAGNOSTIC FINDINGS:  None available for shoulder; x-ray (-)   02/13/23 MRI Lt shoulder -  some joint space narrowing, subchondral sclerosis, subchondral cyst formation and osteophyte formation in the glenohumeral joint, indicative of osteoarthritis.  POSTURE: Patient presents with head forward posture with increased thoracic kyphosis; shoulders rounded and elevated; scapulae abducted and rotated along the thoracic spine; head of the humerus anterior in orientation.  CERVICAL ROM: 02/02/23 Flexion 43 tight pain Lt upper trap  Extension 48 tight ant/post neck R lateral flexion 36 tight L cervical  L lateral flexion 42 R rotation 71 L rotation 62 tight pain Lt cervical to upper trap    UPPER EXTREMITY ROM:   Active ROM Right eval Left eval Left  01/11/23 Left  02/02/23 Left(passive) 03/15/23 Left passive 03/29/23  AAROM  04/04/23 AAROM 05/05/23  Shoulder flexion 160 112 130 107 pain  80 125 138 148  Shoulder extension 59 25 48 31 pain  NT NT    Shoulder abduction (scaption) 168 112 133 92 pain  NT 84 92 128  Shoulder adduction          Shoulder internal rotation T7 Thumb to waist Thumb  T12 Hand lateral hip pain  NT neutral    Shoulder external rotation (in scapular plane) 95 85 93 45 pain  NT 70 75 75  Elbow flexion     120 WNL    Elbow extension     0 WFL    Wrist flexion  Wrist  extension          Wrist ulnar deviation          Wrist radial deviation          Wrist pronation          Wrist supination          (Blank rows = not tested) PASSIVE ROM - supine flexion 152; scaption 147; ER 90   AAROM/PROM supine - 04/19/23: PROM ~ 140 deg flexion; 92 deg abduction in scapular plane; 70 deg ER in scapular plane    UPPER EXTREMITY MMT: strength not assessed - moves Lt UE well against gravity; limited by pain  MMT Right eval Left eval  Shoulder flexion    Shoulder extension    Shoulder abduction    Shoulder adduction    Shoulder internal rotation    Shoulder external rotation    Middle trapezius    Lower trapezius    Elbow flexion    Elbow extension    Wrist flexion    Wrist extension    Wrist ulnar deviation    Wrist radial deviation    Wrist pronation    Wrist supination    Grip strength (lbs)    (Blank rows = not tested) 05/17/23:  Grip - R 37; L 18 Lateral pinch R 4.5; L 1.5 Three jaw: R 3.5; L .5     05/26/23:  Grip - R 37; L 26 Lateral pinch R 4.5; L 2 Three jaw: R 3.5; L .6      JOINT MOBILITY TESTING:  Significant joint tightness Lt shoulder in all planes  02/02/23: Significant joint tightness Lt shoulder in all planes 03/15/23: NT  UPPER LIMB TENSION TEST:  Positive L with patient unable to achieve 90 deg shoulder abduction for testing. UE in ~ 60 deg abduction with elbow extension produced radicular symptoms into Lt UE   PALPATION:  Muscular tightness Lt shoulder through the upper trap; pecs; leveator; teres 02/02/23: muscular tightness Lt shoulder girdle in upper trap; pecs; leveator; rhomboids; teres 02/09/23: muscular tightness Lt cervical and upper trap area through the shoulder girdle including pecs; leveator; rhomboids; teres 02/15/23: persistent tightness L cervical and upper trap area  03/15/23: muscular tightness L shoulder girdle   04/25/23 - Observations:  Patient has poor movement quality with over activation of L upper trap  with all active movement of L UE  [facility] Adult PT Treatment:                                                DATE: 05/30/2023 Therapeutic Exercise: Pulley  Flexion (PT holding upper trap to decrease scapular elevation last few reps) Horizontal ab/adduction with pulley scap squeeze  Standing (HEP) Scap  squeezes Row isometric red TB 3 sec x 10  Row red TB 3 sec x 10 ER isometric red TB 3 sec x 10 ER red TB 3 sec x 10  IR isometric red TB 3 sec x 10  IR red TB 3 sec x 10  Chest lift/scap squeeze  In clinic  Shoulder horizontal abduction stretch at wall 20 sec x 3  Corner stretch low,mid, high range position  Supine HEP T position for stretch through pecs and UE 10-20 sec x 3 Trunk rotation for stretch for pecs 20-30 sec x 4 Quadruped   Cat cow x 5  Child's pose to stretch into shoulder flexion 20 sec x 3 Thread the needle 10 sec x 2 L  Prone  Shoulder extension 3 sec x 2 Axial extension with scap squeeze 3 sec x 6  Neuromuscular re-education:  Patient working in sitting - improve smooth control and improve muscular activation with decreased use of L upper trap with active assistive shoulder elevation. Working to facilitate posterior shoulder girdle musculature   Manual Therapy: Working on PROM and stretching with PT providing inhibition of upper trap for range into elevation. Soft tissue mobilization and trigger point work through the shoulder girdle musculature upper trap and leveator as well as periscapular musculature and pecs Scapular mobilization Trigger Point Dry-Needling  Treatment instructions: Expect mild to moderate muscle soreness. S/S of pneumothorax if dry needled over a lung field, and to seek immediate medical attention should they occur. Patient verbalized understanding of these instructions and education.  Patient Consent Given: Yes Education handout provided: Previously provided Muscles treated: L cervical scaleni/paraspinal area  Electrical stimulation  performed: no  Parameters: n/a  Treatment response/outcome: decreased palpable tightness   [facility] Adult PT Treatment:                                                DATE: 05/24/2023 Therapeutic Exercise: Pulley  Flexion (PT holding upper trap to decrease scapular elevation last few reps) Horizontal ab/adduction with pulley scap squeeze  Standing  Scap  squeezes Row isometric red TB 3 sec x 10  Row red TB 3 sec x 10 ER isometric red TB 3 sec x 10 ER red TB 3 sec x 10  IR isometric red TB 3 sec x 10  IR red TB 3 sec x 10  Chest lift/scap squeeze  Corner stretch low,mid, high range position  Supine HEP T position for stretch through pecs and UE 10-20 sec x 3 Trunk rotation for stretch for pecs 20-30 sec x 4 Quadruped HEP  Cat cow x 5  Child's pose to stretch into shoulder flexion 20 sec x 3 Thread the needle 10 sec x 2 L  Prone  Shoulder extension 3 sec x 2 Axial extension with scap squeeze 3 sec x 6  Neuromuscular re-education:  Patient working in sitting - improve smooth control and improve muscular activation with decreased use of L upper trap with active assistive shoulder elevation. Working to facilitate posterior shoulder girdle musculature    Manual Therapy: Working on PROM and stretching with PT providing inhibition of upper trap for range into elevation. Soft tissue mobilization and trigger point work through the shoulder girdle musculature upper trap and leveator as well as periscapular musculature and pecs Scapular mobilization Trigger Point Dry-Needling  Treatment instructions: Expect mild to moderate muscle soreness. S/S of pneumothorax if dry needled over a lung field, and to seek immediate medical attention should they occur. Patient verbalized understanding of these instructions and education.  Patient Consent Given: Yes Education handout provided: Previously provided Muscles treated: L cervical scaleni/paraspinal area  Electrical stimulation performed: no   Parameters: n/a  Treatment response/outcome: decreased palpable tightness    Kinesotaping L shoulder (as needed) Inhibition of upper trap  Anterior shoulder instability  Modalities: MH L shoulder complex; cervical   PATIENT EDUCATION: Education details: POC; HEP Person educated: Patient Education method: Explanation, Demonstration, Tactile cues, Verbal cues, and Handouts Education comprehension: verbalized understanding, returned demonstration,  verbal cues required, tactile cues required, and needs further education  HOME EXERCISE PROGRAM: (from treatment prior to surgery 03/14/23)  E-mailed to: [email or web address]   Access Code: [email or web address] URL: [email or web address] Date: 01/13/2023 Prepared by: Corlis Leak  Exercises - Seated Shoulder Flexion AAROM with Pulley Behind  - 2 x daily - 7 x weekly - 1 sets - 10 reps - 10 sec  hold - Seated Shoulder Scaption AAROM with Pulley at Side  - 2 x daily - 7 x weekly - 1 sets - 10 reps - 10sec  hold - Seated Cervical Retraction  - 2 x daily - 7 x weekly - 1-2 sets - 5-10 reps - 10 sec  hold - Seated Scapular Retraction  - 2 x daily - 7 x weekly - 1-2 sets - 10 reps - 10 sec  hold - Standing Scapular Retraction  - 3 x daily - 7 x weekly - 1 sets - 10 reps - 10 sec  hold - Shoulder External Rotation and Scapular Retraction  - 3 x daily - 7 x weekly - 1 sets - 10 reps - 3-5 sec   hold - Seated Shoulder W  - 2 x daily - 7 x weekly - 1 sets - 10 reps - 3 sec  hold - Shoulder External Rotation and Scapular Retraction with Resistance  - 2 x daily - 7 x weekly - 1 sets - 10 reps - 3-5 sec  hold - Shoulder W - External Rotation with Resistance  - 2 x daily - 7 x weekly - 1-2 sets - 10 reps - 3 sec  hold - Standing Bilateral Low Shoulder Row with Anchored Resistance  - 2 x daily - 7 x weekly - 1-3 sets - 10 reps - 2-3 sec  hold - Drawing Bow  - 1 x daily - 7 x weekly - 1 sets - 10 reps - 3 sec  hold - Prone Scapular Retraction  - 2 x daily  - 7 x weekly - 1 sets - 5-10 reps - 3-5 sec  hold - Cat Cow  - 2 x daily - 7 x weekly - 1 sets - 5-10 reps - 3-5 sec  hold - Cat Cow to Child's Pose  - 2 x daily - 7 x weekly - 1 sets - 3 reps - 20-30 sec  hold  ASSESSMENT:  CLINICAL IMPRESSION: Progressing well with decreased pain in the L shoulder, aking less medication and sleeping better. She is working on L hand and finger exercises at home to address weakness in L hand and fingers. Progressed with strengthening L UE. Note decreased palpable tightness in the L cervical and upper trap musculature. She is working on Engineer, petroleum re-ed exercises at home. Continued with DN and STM through the upper trap and posterior shoulder girdle musculature.     04/21/23: ESI went well 04/20/23 and she can see improvement in the L UE symptoms. Manual work decreases muscular tightness and patient does tolerated PROM/stretching better. She continues to have tight but empty end feel. Note some improving muscular tightness through L shoulder girdle with persistent tightness in [facility] joint with decreased mobility and stability in the L scapulothoracic range. Patient is working on her exercises and ROM at home.    Re-eval: 03/15/23: patient returns post surgery 03/14/23 for L shoulder debridement; labral repair; biceps tendon release; removal of bone spurs. She presents with L UE in sling with bulky dressing L shoulder. Patient has some pain but this is minimal due to  lasting effects of spinal block. She has poor posture and alignment; limited cervical and L UE mobility; limited functional activity tolerance with no lifting allowed L UE; pain on a constant basis. Patient will benefit from PT to address problems identified.    03/07/23: Patient reports continued pain which is variable in intensity. She was seen by MD and has had a cervical MRI but she does not have results and will not see MD until 03/17/23. Patient has continued radicular UE symptoms that seem cervical in  nature. Shoulder ROM is decreased and patient is unable to tolerate PROM/stretching for L shoulder. We will continue with treatment as tolerated and patient schedules. She is continuing with meds and exercises as tolerated from HEP. She continues to have cervical involvement of L UE symptoms as well as shoulder pathology. She has positive ULTT and muscular tightness to palpation through the L cervical and upper trap/clavicular area. Responds favorably to intervention through the L cervical spine but decreased symptoms are temporary. Continue axial extension in supine and neural mobilization as pt tolerates. MRI shows arthritis in L shoulder but no RC pathology.   OBJECTIVE IMPAIRMENTS: Lt shoulder pain which started with lifting overhead her luggage when travelling. She has had gradually increasing pain and stiffness in the Lt shoulder. She received an injection ~ 1-2 weeks ago with some improvement. Patient presents with poor posture and alignment; limited A and PROM Lt shoulder; functional weakness due to pain; decreased functional activities and pain on a daily basis    GOALS: Goals reviewed with patient? Yes  SHORT TERM GOALS: Target date: 04/26/2023  Independent in initial HEP  Baseline: Goal status: INITIAL  2.  Increase AROM shoulder flexion and abduction to 100-110 degrees  Baseline:  Goal status: INITIAL   LONG TERM GOALS: Target date: 06/07/2023  Decrease pain in the Lt shoulder by 75-100% allowing patient to return to normal functional activities  Baseline:  Goal status: INITIAL  2.  AROM Lt shoulder WFL's and pain free  Baseline:  Goal status: INITIAL  3.  Patient reports return to normal functional activities including lifting grandson with minimal to no increase in Lt shoulder pain  Baseline:  Goal status: INITIAL  4.  Independent in HEP  Baseline:  Goal status: INITIAL  PLAN:  PT FREQUENCY: 2x/week  PT DURATION: 12 weeks  PLANNED INTERVENTIONS: Therapeutic  exercises, Therapeutic activity, Neuromuscular re-education, Patient/Family education, Self Care, Joint mobilization, Aquatic Therapy, Dry Needling, Electrical stimulation, Spinal mobilization, Cryotherapy, Moist heat, Taping, Vasopneumatic device, Ultrasound, Ionotophoresis 4mg /ml Dexamethasone, Manual therapy, and Re-evaluation  PLAN FOR NEXT SESSION: review and progress exercises; continue with postural correction and education; manual work, DN, modalities as indicated    [employer], PT 05/30/2023, 10:21 AM

## 2023-05-31 DIAGNOSIS — E161 Other hypoglycemia: Secondary | ICD-10-CM | POA: Diagnosis not present

## 2023-05-31 DIAGNOSIS — E2740 Unspecified adrenocortical insufficiency: Secondary | ICD-10-CM | POA: Diagnosis not present

## 2023-06-01 DIAGNOSIS — T63421A Toxic effect of venom of ants, accidental (unintentional), initial encounter: Secondary | ICD-10-CM | POA: Diagnosis not present

## 2023-06-01 DIAGNOSIS — T7840XA Allergy, unspecified, initial encounter: Secondary | ICD-10-CM | POA: Diagnosis not present

## 2023-06-02 ENCOUNTER — Encounter: Payer: Self-pay | Admitting: Rehabilitative and Restorative Service Providers"

## 2023-06-02 ENCOUNTER — Ambulatory Visit
Payer: BC Managed Care – PPO | Attending: Orthopedic Surgery | Admitting: Rehabilitative and Restorative Service Providers"

## 2023-06-02 DIAGNOSIS — M25512 Pain in left shoulder: Secondary | ICD-10-CM | POA: Diagnosis not present

## 2023-06-02 DIAGNOSIS — G8929 Other chronic pain: Secondary | ICD-10-CM | POA: Insufficient documentation

## 2023-06-02 DIAGNOSIS — M25511 Pain in right shoulder: Secondary | ICD-10-CM | POA: Diagnosis not present

## 2023-06-02 DIAGNOSIS — R29898 Other symptoms and signs involving the musculoskeletal system: Secondary | ICD-10-CM | POA: Diagnosis not present

## 2023-06-02 DIAGNOSIS — M6281 Muscle weakness (generalized): Secondary | ICD-10-CM | POA: Diagnosis not present

## 2023-06-02 NOTE — Therapy (Signed)
OUTPATIENT PHYSICAL THERAPY SHOULDER TREATMENT   Patient Name: Lori King MRN: [id number] DOB:07/19/1966, 57 y.o., female Today's Date: 06/02/2023  END OF SESSION:  PT End of Session - 06/02/23 1110     Visit Number 23    Number of Visits 24    Date for PT Re-Evaluation 06/07/23    Authorization Type [employer] [id number]    PT Start Time 1100    PT Stop Time 1150    PT Time Calculation (min) 50 min    Activity Tolerance Patient tolerated treatment well             Past Medical History:  Diagnosis Date   Adhesive capsulitis of right shoulder    Allergic rhinitis, seasonal    Anemia    Anxiety    MDD (major depressive disorder)    Wears contact lenses    Past Surgical History:  Procedure Laterality Date   BREAST BIOPSY Left 2010   x2 per pt benign   CESAREAN SECTION  X2  last one 02-28-2001   W/  BILATERAL TUBAL LIGATION w/ last c/s   COLONOSCOPY  11/01/2017   HYSTEROSCOPY W/ ENDOMETRIAL ABLATION  2019   LUMBAR LAMINECTOMY/DECOMPRESSION MICRODISCECTOMY  2013   L4  --- S1   SHOULDER ARTHROSCOPY Right 08/28/2020   Procedure: ARTHROSCOPY SHOULDER lysis of adhesions and manipulation under anesthesia, extensive debridement;  Surgeon: Yolonda Kida, MD;  Location: [facility] [place];  Service: Orthopedics;  Laterality: Right;   There are no problems to display for this patient.   PCP: Dr Angelica Chessman  REFERRING PROVIDER: Dr Arlyce Harman  REFERRING DIAG: pain of Lt shoulder  THERAPY DIAG:  Acute pain of left shoulder  Muscle weakness (generalized)  Other symptoms and signs involving the musculoskeletal system  Chronic right shoulder pain  Rationale for Evaluation and Treatment: Rehabilitation  ONSET DATE: 10/24/22  SUBJECTIVE:                                                                                                                                                                                      SUBJECTIVE  STATEMENT: Patient reports that her shoulder continues to improve with less pain and increased movement. She is taking less medication and sleeping better. She continues to use the L UE for more functional activities. She has persistent tightness int L cervical area as well as increased tightness in the pecs.    03/15/23 RE-EVAL: Patient underwent L shoulder surgery with extensive debridement; labral repair; biceps tendon release; removal of bone spurs 03/14/23.  She has minimal pain due to remaining effects of spinal block for surgery. Bulky dressing limits movement and the effect  of ice. Remains in the sling.   Cervical MRI shows degenerative changes C5/6. MD will send order for cervical spine.   Patient reports continued pain in the L UE symptoms. She has stiffness and tightness in the L shoulder and the shoulder is tighter. She does not have the results of the MRI. She is scheduled to see the orthopedist, Dr Aundria Rud, tomorrow 03/17/23. She does not know results of neck MRI. [name] reports that she is taking gabapentin 3 times a day;  the muscle relaxants every 6 hours; finished a steroid dose pack; percocet as needed. She started the antiinflammatory medication this morning. She has less of the sharp pain and can tell the medication is helping. She received a steroid injection with [place]. Pain in the L neck and shoulder area continues. She has tingling and numbness in the ring and little fingers and the thumb and now in the palm of her hand on an intermittent basis. She is having difficulty getting comfortable to sleep.    EVAL: Patient reports that she was lifting her luggage out of the overhead bin when she felt pain in the Lt shoulder and has had some pain in an intermittent basis. She had an injection in Lt shoulder 12/22/22 with some improvement but shoulder is stiff and painful.  Hand dominance: Right  PERTINENT HISTORY: Rt shoulder pain 3 yrs with shoulder surgery for labrum tear and biceps tendon;  adhesive capsulitis; reactive hypoglycemic disease; fatty liver  PAIN:  Are you having pain? Yes: NPRS scale: 1/10 shoulder/biceps Pain location: R shoulder/arm/shoulder blade area  Pain description: sharp; stabbing pain in biceps radiating to elbow with big reaching to the side  Aggravating factors: reaching overhead to the side and behind  Relieving factors: ice; TENs unit   PRECAUTIONS: None  WEIGHT BEARING RESTRICTIONS: No  FALLS:  Has patient fallen in last 6 months? No   PATIENT GOALS:get rid of the shoulder pain and increase ROM   NEXT MD VISIT: 03/29/23 (Dr Aundria Rud)   OBJECTIVE:   DIAGNOSTIC FINDINGS:  None available for shoulder; x-ray (-)   02/13/23 MRI Lt shoulder -  some joint space narrowing, subchondral sclerosis, subchondral cyst formation and osteophyte formation in the glenohumeral joint, indicative of osteoarthritis.  POSTURE: Patient presents with head forward posture with increased thoracic kyphosis; shoulders rounded and elevated; scapulae abducted and rotated along the thoracic spine; head of the humerus anterior in orientation.  CERVICAL ROM: 02/02/23 Flexion 43 tight pain Lt upper trap  Extension 48 tight ant/post neck R lateral flexion 36 tight L cervical  L lateral flexion 42 R rotation 71 L rotation 62 tight pain Lt cervical to upper trap    UPPER EXTREMITY ROM:   Active ROM Right eval Left eval Left  01/11/23 Left  02/02/23 Left(passive) 03/15/23 Left passive 03/29/23  AAROM  04/04/23 AAROM 05/05/23  Shoulder flexion 160 112 130 107 pain  80 125 138 148  Shoulder extension 59 25 48 31 pain  NT NT    Shoulder abduction (scaption) 168 112 133 92 pain  NT 84 92 128  Shoulder adduction          Shoulder internal rotation T7 Thumb to waist Thumb  T12 Hand lateral hip pain  NT neutral    Shoulder external rotation (in scapular plane) 95 85 93 45 pain  NT 70 75 75  Elbow flexion     120 WNL    Elbow extension     0 WFL    Wrist flexion  Wrist  extension          Wrist ulnar deviation          Wrist radial deviation          Wrist pronation          Wrist supination          (Blank rows = not tested) PASSIVE ROM - supine flexion 152; scaption 147; ER 90   AAROM/PROM supine - 04/19/23: PROM ~ 140 deg flexion; 92 deg abduction in scapular plane; 70 deg ER in scapular plane    UPPER EXTREMITY MMT: strength not assessed - moves Lt UE well against gravity; limited by pain  MMT Right eval Left eval  Shoulder flexion    Shoulder extension    Shoulder abduction    Shoulder adduction    Shoulder internal rotation    Shoulder external rotation    Middle trapezius    Lower trapezius    Elbow flexion    Elbow extension    Wrist flexion    Wrist extension    Wrist ulnar deviation    Wrist radial deviation    Wrist pronation    Wrist supination    Grip strength (lbs)    (Blank rows = not tested) 05/17/23:  Grip - R 37; L 18 Lateral pinch R 4.5; L 1.5 Three jaw: R 3.5; L .5     05/26/23:  Grip - R 37; L 26 Lateral pinch R 4.5; L 2 Three jaw: R 3.5; L .6      JOINT MOBILITY TESTING:  Significant joint tightness Lt shoulder in all planes  02/02/23: Significant joint tightness Lt shoulder in all planes 03/15/23: NT  UPPER LIMB TENSION TEST:  Positive L with patient unable to achieve 90 deg shoulder abduction for testing. UE in ~ 60 deg abduction with elbow extension produced radicular symptoms into Lt UE   PALPATION:  Muscular tightness Lt shoulder through the upper trap; pecs; leveator; teres 02/02/23: muscular tightness Lt shoulder girdle in upper trap; pecs; leveator; rhomboids; teres 02/09/23: muscular tightness Lt cervical and upper trap area through the shoulder girdle including pecs; leveator; rhomboids; teres 02/15/23: persistent tightness L cervical and upper trap area  03/15/23: muscular tightness L shoulder girdle   04/25/23 - Observations:  Patient has poor movement quality with over activation of L upper trap  with all active movement of L UE 06/02/23: good improvement in quality of movement L UE with increased activation and control of posterior shoulders girdle                                            DATE: 06/02/2023 Therapeutic Exercise: Pulley  Flexion (PT holding upper trap to decrease scapular elevation last few reps) Horizontal ab/adduction with pulley scap squeeze  Standing Scap  squeezes Row isometric green progressing to blue TB 3 sec x 10  Row blue TB 3 sec x 10 ER isometric green TB 3 sec x 10 ER red TB 3 sec x 10  IR isometric red TB 3 sec x 10  IR red TB 3 sec x 10  Chest lift/scap squeeze  Shoulder horizontal abduction stretch at wall 20 sec x 3  Corner stretch low,mid, high range position  Supine (HEP) T position for stretch through pecs and UE 10-20 sec x 3 Trunk rotation for stretch for pecs 20-30 sec x 4  Quadruped (HEP) Cat cow x 5  Child's pose to stretch into shoulder flexion 20 sec x 3 Thread the needle 10 sec x 2 L  Prone (HEP)  Shoulder extension 3 sec x 2 Axial extension with scap squeeze 3 sec x 6  Neuromuscular re-education: (HEP)  Patient working in sitting - improve smooth control and improve muscular activation with decreased use of L upper trap with active assistive shoulder elevation. Working to facilitate posterior shoulder girdle musculature   Manual Therapy: Working on PROM and stretching with PT providing inhibition of upper trap for range into elevation focus on shoulder extension  Soft tissue mobilization and trigger point work through the shoulder girdle musculature upper trap and leveator; biceps; periscapular musculature; Scientist, forensic Dry-Needling  Treatment instructions: Expect mild to moderate muscle soreness. Patient verbalized understanding of these instructions and education. Patient Consent Given: Yes Education handout provided: Previously provided Muscles treated: L cervical scaleni/paraspinal area   Electrical stimulation performed: no  Parameters: n/a  Treatment response/outcome: decreased palpable tightness    [facility] Adult PT Treatment:                                                DATE: 05/30/2023 Therapeutic Exercise: Pulley  Flexion (PT holding upper trap to decrease scapular elevation last few reps) Horizontal ab/adduction with pulley scap squeeze  Standing (HEP) Scap  squeezes Row isometric red TB 3 sec x 10  Row red TB 3 sec x 10 ER isometric red TB 3 sec x 10 ER red TB 3 sec x 10  IR isometric red TB 3 sec x 10  IR red TB 3 sec x 10  Chest lift/scap squeeze  In clinic  Shoulder horizontal abduction stretch at wall 20 sec x 3  Corner stretch low,mid, high range position  Supine HEP T position for stretch through pecs and UE 10-20 sec x 3 Trunk rotation for stretch for pecs 20-30 sec x 4 Quadruped   Cat cow x 5  Child's pose to stretch into shoulder flexion 20 sec x 3 Thread the needle 10 sec x 2 L  Prone  Shoulder extension 3 sec x 2 Axial extension with scap squeeze 3 sec x 6  Neuromuscular re-education:  Patient working in sitting - improve smooth control and improve muscular activation with decreased use of L upper trap with active assistive shoulder elevation. Working to facilitate posterior shoulder girdle musculature   Manual Therapy: Working on PROM and stretching with PT providing inhibition of upper trap for range into elevation. Soft tissue mobilization and trigger point work through the shoulder girdle musculature upper trap and leveator as well as periscapular musculature and pecs Scapular mobilization Trigger Point Dry-Needling  Treatment instructions: Expect mild to moderate muscle soreness. S/S of pneumothorax if dry needled over a lung field, and to seek immediate medical attention should they occur. Patient verbalized understanding of these instructions and education.  Patient Consent Given: Yes Education handout provided: Previously  provided Muscles treated: L cervical scaleni/paraspinal area  Electrical stimulation performed: no  Parameters: n/a  Treatment response/outcome: decreased palpable tightness    PATIENT EDUCATION: Education details: POC; HEP Person educated: Patient Education method: Explanation, Demonstration, Tactile cues, Verbal cues, and Handouts Education comprehension: verbalized understanding, returned demonstration, verbal cues required, tactile cues required, and needs further education  HOME EXERCISE PROGRAM: (from treatment prior  to surgery 03/14/23)  E-mailed to: [email or web address]   Access Code: [email or web address] URL: [email or web address] Date: 01/13/2023 Prepared by: Corlis Leak  Exercises - Seated Shoulder Flexion AAROM with Pulley Behind  - 2 x daily - 7 x weekly - 1 sets - 10 reps - 10 sec  hold - Seated Shoulder Scaption AAROM with Pulley at Side  - 2 x daily - 7 x weekly - 1 sets - 10 reps - 10sec  hold - Seated Cervical Retraction  - 2 x daily - 7 x weekly - 1-2 sets - 5-10 reps - 10 sec  hold - Seated Scapular Retraction  - 2 x daily - 7 x weekly - 1-2 sets - 10 reps - 10 sec  hold - Standing Scapular Retraction  - 3 x daily - 7 x weekly - 1 sets - 10 reps - 10 sec  hold - Shoulder External Rotation and Scapular Retraction  - 3 x daily - 7 x weekly - 1 sets - 10 reps - 3-5 sec   hold - Seated Shoulder W  - 2 x daily - 7 x weekly - 1 sets - 10 reps - 3 sec  hold - Shoulder External Rotation and Scapular Retraction with Resistance  - 2 x daily - 7 x weekly - 1 sets - 10 reps - 3-5 sec  hold - Shoulder W - External Rotation with Resistance  - 2 x daily - 7 x weekly - 1-2 sets - 10 reps - 3 sec  hold - Standing Bilateral Low Shoulder Row with Anchored Resistance  - 2 x daily - 7 x weekly - 1-3 sets - 10 reps - 2-3 sec  hold - Drawing Bow  - 1 x daily - 7 x weekly - 1 sets - 10 reps - 3 sec  hold - Prone Scapular Retraction  - 2 x daily - 7 x weekly - 1 sets - 5-10 reps - 3-5 sec   hold - Cat Cow  - 2 x daily - 7 x weekly - 1 sets - 5-10 reps - 3-5 sec  hold - Cat Cow to Child's Pose  - 2 x daily - 7 x weekly - 1 sets - 3 reps - 20-30 sec  hold  ASSESSMENT:  CLINICAL IMPRESSION: Progressing well with decreased pain in the L shoulder, patient is taking less medication and sleeping better. She is working on L hand and finger exercises at home to address weakness in L hand and fingers. Progressed with strengthening L UE increasing resistive bands for clinic and home. Note decreased palpable tightness in the L cervical and upper trap musculature. [name] has continued tightness through the L biceps area addressed with manual work and passive stretch into shoulder extension with elbow flexed. She is working on neuromuscular re-ed exercises at home with excellent improvement in scapular control and positions for active movement/use of L UE. Continued with DN L cervical spine and STM through the cervical musculature, upper trap and biceps musculature. Recertification and MD note at next visit    04/21/23: ESI went well 04/20/23 and she can see improvement in the L UE symptoms. Manual work decreases muscular tightness and patient does tolerated PROM/stretching better. She continues to have tight but empty end feel. Note some improving muscular tightness through L shoulder girdle with persistent tightness in [facility] joint with decreased mobility and stability in the L scapulothoracic range. Patient is working on her exercises and ROM at home.    Re-eval: 03/15/23: patient returns post  surgery 03/14/23 for L shoulder debridement; labral repair; biceps tendon release; removal of bone spurs. She presents with L UE in sling with bulky dressing L shoulder. Patient has some pain but this is minimal due to lasting effects of spinal block. She has poor posture and alignment; limited cervical and L UE mobility; limited functional activity tolerance with no lifting allowed L UE; pain on a constant basis.  Patient will benefit from PT to address problems identified.    03/07/23: Patient reports continued pain which is variable in intensity. She was seen by MD and has had a cervical MRI but she does not have results and will not see MD until 03/17/23. Patient has continued radicular UE symptoms that seem cervical in nature. Shoulder ROM is decreased and patient is unable to tolerate PROM/stretching for L shoulder. We will continue with treatment as tolerated and patient schedules. She is continuing with meds and exercises as tolerated from HEP. She continues to have cervical involvement of L UE symptoms as well as shoulder pathology. She has positive ULTT and muscular tightness to palpation through the L cervical and upper trap/clavicular area. Responds favorably to intervention through the L cervical spine but decreased symptoms are temporary. Continue axial extension in supine and neural mobilization as pt tolerates. MRI shows arthritis in L shoulder but no RC pathology.   OBJECTIVE IMPAIRMENTS: Lt shoulder pain which started with lifting overhead her luggage when travelling. She has had gradually increasing pain and stiffness in the Lt shoulder. She received an injection ~ 1-2 weeks ago with some improvement. Patient presents with poor posture and alignment; limited A and PROM Lt shoulder; functional weakness due to pain; decreased functional activities and pain on a daily basis    GOALS: Goals reviewed with patient? Yes  SHORT TERM GOALS: Target date: 04/26/2023  Independent in initial HEP  Baseline: Goal status: met  2.  Increase AROM shoulder flexion and abduction to 100-110 degrees  Baseline:  Goal status: met   LONG TERM GOALS: Target date: 06/07/2023  Decrease pain in the Lt shoulder by 75-100% allowing patient to return to normal functional activities  Baseline:  Goal status: INITIAL  2.  AROM Lt shoulder WFL's and pain free  Baseline:  Goal status: INITIAL  3.  Patient reports  return to normal functional activities including lifting grandson with minimal to no increase in Lt shoulder pain  Baseline:  Goal status: INITIAL  4.  Independent in HEP  Baseline:  Goal status: INITIAL  PLAN:  PT FREQUENCY: 2x/week  PT DURATION: 12 weeks  PLANNED INTERVENTIONS: Therapeutic exercises, Therapeutic activity, Neuromuscular re-education, Patient/Family education, Self Care, Joint mobilization, Aquatic Therapy, Dry Needling, Electrical stimulation, Spinal mobilization, Cryotherapy, Moist heat, Taping, Vasopneumatic device, Ultrasound, Ionotophoresis 4mg /ml Dexamethasone, Manual therapy, and Re-evaluation  PLAN FOR NEXT SESSION: review and progress exercises; continue with postural correction and education; manual work, DN, modalities as indicated    [employer], PT 06/02/2023, 11:48 AM

## 2023-06-06 ENCOUNTER — Encounter: Payer: BC Managed Care – PPO | Admitting: Rehabilitative and Restorative Service Providers"

## 2023-06-07 ENCOUNTER — Ambulatory Visit: Payer: BC Managed Care – PPO | Admitting: Rehabilitative and Restorative Service Providers"

## 2023-06-07 ENCOUNTER — Encounter: Payer: Self-pay | Admitting: Rehabilitative and Restorative Service Providers"

## 2023-06-07 DIAGNOSIS — R29898 Other symptoms and signs involving the musculoskeletal system: Secondary | ICD-10-CM

## 2023-06-07 DIAGNOSIS — M25512 Pain in left shoulder: Secondary | ICD-10-CM

## 2023-06-07 DIAGNOSIS — M6281 Muscle weakness (generalized): Secondary | ICD-10-CM

## 2023-06-07 DIAGNOSIS — G8929 Other chronic pain: Secondary | ICD-10-CM

## 2023-06-07 DIAGNOSIS — M25511 Pain in right shoulder: Secondary | ICD-10-CM | POA: Diagnosis not present

## 2023-06-07 NOTE — Therapy (Signed)
OUTPATIENT PHYSICAL THERAPY SHOULDER TREATMENT RE-CERTIFICATION AND MD PROGRESS NOTE   Patient Name: Lori King MRN: [id number] DOB:06/14/1966, 57 y.o., female Today's Date: 06/07/2023  END OF SESSION:  PT End of Session - 06/07/23 1022     Visit Number 24    Number of Visits 34    Date for PT Re-Evaluation 08/16/23    Authorization Type [employer] [id number]    PT Start Time 1015    PT Stop Time 1108    PT Time Calculation (min) 53 min    Activity Tolerance Patient tolerated treatment well             Past Medical History:  Diagnosis Date   Adhesive capsulitis of right shoulder    Allergic rhinitis, seasonal    Anemia    Anxiety    MDD (major depressive disorder)    Wears contact lenses    Past Surgical History:  Procedure Laterality Date   BREAST BIOPSY Left 2010   x2 per pt benign   CESAREAN SECTION  X2  last one 02-28-2001   W/  BILATERAL TUBAL LIGATION w/ last c/s   COLONOSCOPY  11/01/2017   HYSTEROSCOPY W/ ENDOMETRIAL ABLATION  2019   LUMBAR LAMINECTOMY/DECOMPRESSION MICRODISCECTOMY  2013   L4  --- S1   SHOULDER ARTHROSCOPY Right 08/28/2020   Procedure: ARTHROSCOPY SHOULDER lysis of adhesions and manipulation under anesthesia, extensive debridement;  Surgeon: Yolonda Kida, MD;  Location: [facility];  Service: Orthopedics;  Laterality: Right;   There are no problems to display for this patient.   PCP: Dr Jolyn Nap Alphonse Guild  REFERRING PROVIDER: Dr Arlyce Harman  REFERRING DIAG: pain of Lt shoulder  THERAPY DIAG:  Acute pain of left shoulder - Plan: PT plan of care cert/re-cert  Muscle weakness (generalized) - Plan: PT plan of care cert/re-cert  Other symptoms and signs involving the musculoskeletal system - Plan: PT plan of care cert/re-cert  Chronic right shoulder pain - Plan: PT plan of care cert/re-cert  Rationale for Evaluation and Treatment: Rehabilitation  ONSET DATE: 10/24/22  SUBJECTIVE:                                                                                                                                                                                       SUBJECTIVE STATEMENT: Patient reports that her shoulder continues to improve with less pain and increased mobility and functional activities. She is taking less medication and sleeping better. She has persistent tightness in the L neck area as well as increased tightness in the pecs.    03/15/23 RE-EVAL: Patient underwent L shoulder surgery with extensive debridement; labral repair; biceps tendon release;  removal of bone spurs 03/14/23.  She has minimal pain due to remaining effects of spinal block for surgery. Bulky dressing limits movement and the effect of ice. Remains in the sling.   Cervical MRI shows degenerative changes C5/6. MD will send order for cervical spine.   Patient reports continued pain in the L UE symptoms. She has stiffness and tightness in the L shoulder and the shoulder is tighter. She does not have the results of the MRI. She is scheduled to see the orthopedist, Dr Aundria Rud, tomorrow 03/17/23. She does not know results of neck MRI. [name] reports that she is taking gabapentin 3 times a day;  the muscle relaxants every 6 hours; finished a steroid dose pack; percocet as needed. She started the antiinflammatory medication this morning. She has less of the sharp pain and can tell the medication is helping. She received a steroid injection with [place]. Pain in the L neck and shoulder area continues. She has tingling and numbness in the ring and little fingers and the thumb and now in the palm of her hand on an intermittent basis. She is having difficulty getting comfortable to sleep.    EVAL: Patient reports that she was lifting her luggage out of the overhead bin when she felt pain in the Lt shoulder and has had some pain in an intermittent basis. She had an injection in Lt shoulder 12/22/22 with some improvement but shoulder is stiff and  painful.  Hand dominance: Right  PERTINENT HISTORY: Rt shoulder pain 3 yrs with shoulder surgery for labrum tear and biceps tendon; adhesive capsulitis; reactive hypoglycemic disease; fatty liver  PAIN:  Are you having pain? Yes: NPRS scale: 2/10 shoulder/biceps Pain location: R shoulder/arm/shoulder blade area  Pain description: sharp; stabbing pain in biceps radiating to elbow with big reaching to the side  Aggravating factors: reaching overhead to the side and behind  Relieving factors: ice; TENs unit   PRECAUTIONS: None  WEIGHT BEARING RESTRICTIONS: No  FALLS:  Has patient fallen in last 6 months? No   PATIENT GOALS:get rid of the shoulder pain and increase ROM   NEXT MD VISIT: 03/29/23 (Dr Aundria Rud)   OBJECTIVE:   DIAGNOSTIC FINDINGS:  None available for shoulder; x-ray (-)   02/13/23 MRI Lt shoulder -  some joint space narrowing, subchondral sclerosis, subchondral cyst formation and osteophyte formation in the glenohumeral joint, indicative of osteoarthritis.  POSTURE: Patient presents with head forward posture with increased thoracic kyphosis; shoulders rounded and elevated; scapulae abducted and rotated along the thoracic spine; head of the humerus anterior in orientation.  CERVICAL ROM: 02/02/23 Flexion 43 tight pain Lt upper trap  Extension 48 tight ant/post neck R lateral flexion 36 tight L cervical  L lateral flexion 42 R rotation 71 L rotation 62 tight pain Lt cervical to upper trap     CERVICAL ROM: 06/07/23 Flexion 48 tight pain Lt upper trap  Extension 51 tight post neck R lateral flexion 34 tight L cervical  L lateral flexion 34 tight  R rotation 64 tight  L rotation 62 tight   UPPER EXTREMITY ROM:   Active ROM Right eval Left eval Left  01/11/23 Left  02/02/23 Left(passive) 03/15/23 Left passive 03/29/23  AAROM  04/04/23 AAROM 05/05/23 AAROM  06/07/23  Shoulder flexion 160 112 130 107 pain  80 125 138 148 150  Shoulder extension 59 25 48 31 pain  NT  NT   57  Shoulder abduction (scaption) 168 112 133 92 pain  NT 84 92  128 145  Shoulder adduction           Shoulder internal rotation T7 Thumb to waist Thumb  T12 Hand lateral hip pain  NT neutral   Hand to waist   Shoulder external rotation (in scapular plane) 95 85 93 45 pain  NT 70 75 75 70  Elbow flexion     120 WNL     Elbow extension     0 WFL     Wrist flexion           Wrist extension           Wrist ulnar deviation           Wrist radial deviation           Wrist pronation           Wrist supination           (Blank rows = not tested) PASSIVE ROM - supine flexion 152; scaption 147; ER 90   AAROM/PROM supine - 04/19/23: PROM ~ 140 deg flexion; 92 deg abduction in scapular plane; 70 deg ER in scapular plane    UPPER EXTREMITY MMT: strength not assessed - moves Lt UE well against gravity; limited by pain  MMT Right eval Left eval  Shoulder flexion    Shoulder extension    Shoulder abduction    Shoulder adduction    Shoulder internal rotation    Shoulder external rotation    Middle trapezius    Lower trapezius    Elbow flexion    Elbow extension    Wrist flexion    Wrist extension    Wrist ulnar deviation    Wrist radial deviation    Wrist pronation    Wrist supination    Grip strength (lbs)    (Blank rows = not tested) 05/17/23:  Grip - R 37; L 18 Lateral pinch R 4.5; L 1.5 Three jaw: R 3.5; L .5     05/26/23:  Grip - R 37; L 26 Lateral pinch R 4.5; L 2 Three jaw: R 3.5; L .6     06/07/23:  Grip - R 37; L 33 Lateral pinch R 4.5; L 4 Three jaw: R 3.5; L 3.5     JOINT MOBILITY TESTING:  Significant joint tightness Lt shoulder in all planes  02/02/23: Significant joint tightness Lt shoulder in all planes 03/15/23: NT  UPPER LIMB TENSION TEST:  Positive L with patient unable to achieve 90 deg shoulder abduction for testing. UE in ~ 60 deg abduction with elbow extension produced radicular symptoms into Lt UE   PALPATION:  Muscular tightness Lt shoulder  through the upper trap; pecs; leveator; teres 02/02/23: muscular tightness Lt shoulder girdle in upper trap; pecs; leveator; rhomboids; teres 02/09/23: muscular tightness Lt cervical and upper trap area through the shoulder girdle including pecs; leveator; rhomboids; teres 02/15/23: persistent tightness L cervical and upper trap area  03/15/23: muscular tightness L shoulder girdle  06/07/23: persistent muscular tightness L cervical area into upper trap and biceps   04/25/23 - Observations:  Patient has poor movement quality with over activation of L upper trap with all active movement of L UE  06/07/23: good improvement in quality of movement L UE with increased activation and control of posterior shoulders girdle   Treatment  DATE: 06/07/2023 Reassessment and measurements for MD progress note today  Therapeutic Exercise: Pulley  Flexion (PT holding upper trap to decrease scapular elevation last few reps) Horizontal ab/adduction with pulley scap squeeze  Standing  Biceps stretch at table 30 sec x 3  Chest lift/scap squeeze  Shoulder horizontal abduction stretch at wall 20 sec x 3  Corner stretch low,mid, high range position  Sitting  Biceps stretch hands behind back 20 sec x 2  Supine  T position for stretch through pecs and UE 10-20 sec x 3 Trunk rotation for stretch for pecs 20-30 sec x 4 Hands behind head for stretch into ER  Neuromuscular re-education: (HEP)  Patient working in sitting - improve smooth control and improve muscular activation with decreased use of L upper trap with active assistive shoulder elevation. Working to facilitate posterior shoulder girdle musculature   Manual Therapy: Working on PROM and stretching with PT providing inhibition of upper trap for range into elevation focus on shoulder extension  Soft tissue mobilization and trigger point work through the shoulder girdle musculature upper trap and leveator; biceps;  periscapular musculature; pecs Scapular mobilization                                            DATE: 06/02/2023 Therapeutic Exercise: Pulley  Flexion (PT holding upper trap to decrease scapular elevation last few reps) Horizontal ab/adduction with pulley scap squeeze  Standing Scap  squeezes Row isometric green progressing to blue TB 3 sec x 10  Row blue TB 3 sec x 10 ER isometric green TB 3 sec x 10 ER red TB 3 sec x 10  IR isometric red TB 3 sec x 10  IR red TB 3 sec x 10  Chest lift/scap squeeze  Shoulder horizontal abduction stretch at wall 20 sec x 3  Corner stretch low,mid, high range position  Supine (HEP) T position for stretch through pecs and UE 10-20 sec x 3 Trunk rotation for stretch for pecs 20-30 sec x 4 Quadruped (HEP) Cat cow x 5  Child's pose to stretch into shoulder flexion 20 sec x 3 Thread the needle 10 sec x 2 L  Prone (HEP)  Shoulder extension 3 sec x 2 Axial extension with scap squeeze 3 sec x 6  Neuromuscular re-education: (HEP)  Patient working in sitting - improve smooth control and improve muscular activation with decreased use of L upper trap with active assistive shoulder elevation. Working to facilitate posterior shoulder girdle musculature   Manual Therapy: Working on PROM and stretching with PT providing inhibition of upper trap for range into elevation focus on shoulder extension  Soft tissue mobilization and trigger point work through the shoulder girdle musculature upper trap and leveator; biceps; periscapular musculature; Optometrist   PATIENT EDUCATION: Education details: POC; HEP Person educated: Patient Education method: Programmer, multimedia, Facilities manager, Actor cues, Verbal cues, and Handouts Education comprehension: verbalized understanding, returned demonstration, verbal cues required, tactile cues required, and needs further education  HOME EXERCISE PROGRAM: (from treatment prior to surgery 03/14/23)  E-mailed to:  [email or web address]   Access Code: [email or web address] URL: [email or web address] Date: 01/13/2023 Prepared by: Corlis Leak  Exercises - Seated Shoulder Flexion AAROM with Pulley Behind  - 2 x daily - 7 x weekly - 1 sets - 10 reps - 10 sec  hold - Seated Shoulder Scaption AAROM with Pulley at Side  - 2 x  daily - 7 x weekly - 1 sets - 10 reps - 10sec  hold - Seated Cervical Retraction  - 2 x daily - 7 x weekly - 1-2 sets - 5-10 reps - 10 sec  hold - Seated Scapular Retraction  - 2 x daily - 7 x weekly - 1-2 sets - 10 reps - 10 sec  hold - Standing Scapular Retraction  - 3 x daily - 7 x weekly - 1 sets - 10 reps - 10 sec  hold - Shoulder External Rotation and Scapular Retraction  - 3 x daily - 7 x weekly - 1 sets - 10 reps - 3-5 sec   hold - Seated Shoulder W  - 2 x daily - 7 x weekly - 1 sets - 10 reps - 3 sec  hold - Shoulder External Rotation and Scapular Retraction with Resistance  - 2 x daily - 7 x weekly - 1 sets - 10 reps - 3-5 sec  hold - Shoulder W - External Rotation with Resistance  - 2 x daily - 7 x weekly - 1-2 sets - 10 reps - 3 sec  hold - Standing Bilateral Low Shoulder Row with Anchored Resistance  - 2 x daily - 7 x weekly - 1-3 sets - 10 reps - 2-3 sec  hold - Drawing Bow  - 1 x daily - 7 x weekly - 1 sets - 10 reps - 3 sec  hold - Prone Scapular Retraction  - 2 x daily - 7 x weekly - 1 sets - 5-10 reps - 3-5 sec  hold - Cat Cow  - 2 x daily - 7 x weekly - 1 sets - 5-10 reps - 3-5 sec  hold - Cat Cow to Child's Pose  - 2 x daily - 7 x weekly - 1 sets - 3 reps - 20-30 sec  hold  ASSESSMENT:  CLINICAL IMPRESSION: Progressing well with decreased pain in the L shoulder, patient is taking less medication and sleeping better. She is working on L hand and finger exercises at home to address weakness in L hand and fingers with good gains in grip and pinch strength. Progressed with strengthening L UE increasing resistive bands for clinic and home. Note decreased palpable tightness  in the L cervical and upper trap musculature. She is working on neuromuscular re-ed exercises with excellent improvement in scapular control and positions for active movement/use of L UE. Continued with DN L cervical spine and STM through the cervical musculature, upper trap and biceps musculature as needed. Progressing well toward stated goals of therapy. Patient will benwfit from continued treatment to achieve maximum rehab potential.                 04/21/23: ESI went well 04/20/23 and she can see improvement in the L UE symptoms. Manual work decreases muscular tightness and patient does tolerated PROM/stretching better. She continues to have tight but empty end feel. Note some improving muscular tightness through L shoulder girdle with persistent tightness in [facility] joint with decreased mobility and stability in the L scapulothoracic range. Patient is working on her exercises and ROM at home.    Re-eval: 03/15/23: patient returns post surgery 03/14/23 for L shoulder debridement; labral repair; biceps tendon release; removal of bone spurs. She presents with L UE in sling with bulky dressing L shoulder. Patient has some pain but this is minimal due to lasting effects of spinal block. She has poor posture and alignment; limited cervical and L UE  mobility; limited functional activity tolerance with no lifting allowed L UE; pain on a constant basis. Patient will benefit from PT to address problems identified.    03/07/23: Patient reports continued pain which is variable in intensity. She was seen by MD and has had a cervical MRI but she does not have results and will not see MD until 03/17/23. Patient has continued radicular UE symptoms that seem cervical in nature. Shoulder ROM is decreased and patient is unable to tolerate PROM/stretching for L shoulder. We will continue with treatment as tolerated and patient schedules. She is continuing with meds and exercises as tolerated from HEP. She continues to have cervical  involvement of L UE symptoms as well as shoulder pathology. She has positive ULTT and muscular tightness to palpation through the L cervical and upper trap/clavicular area. Responds favorably to intervention through the L cervical spine but decreased symptoms are temporary. Continue axial extension in supine and neural mobilization as pt tolerates. MRI shows arthritis in L shoulder but no RC pathology.   OBJECTIVE IMPAIRMENTS: Lt shoulder pain which started with lifting overhead her luggage when travelling. She has had gradually increasing pain and stiffness in the Lt shoulder. She received an injection ~ 1-2 weeks ago with some improvement. Patient presents with poor posture and alignment; limited A and PROM Lt shoulder; functional weakness due to pain; decreased functional activities and pain on a daily basis    GOALS: Goals reviewed with patient? Yes  SHORT TERM GOALS: Target date: 04/26/2023  Independent in initial HEP  Baseline: Goal status: met  2.  Increase AROM shoulder flexion and abduction to 100-110 degrees  Baseline:  Goal status: met   LONG TERM GOALS: Target date: 08/16/2023  Decrease pain in the Lt shoulder by 75-100% allowing patient to return to normal functional activities  Baseline: pain ~ 60% improved Goal status: no going   2.  AROM Lt shoulder WFL's and pain free  Baseline:  Goal status: on going  3.  Patient reports return to normal functional activities including lifting grandson with minimal to no increase in Lt shoulder pain  Baseline:  Goal status: on going   4.  Independent in HEP  Baseline:  Goal status: on going   PLAN:  PT FREQUENCY: 1-2x/week  PT DURATION: 10 weeks  PLANNED INTERVENTIONS: Therapeutic exercises, Therapeutic activity, Neuromuscular re-education, Patient/Family education, Self Care, Joint mobilization, Aquatic Therapy, Dry Needling, Electrical stimulation, Spinal mobilization, Cryotherapy, Moist heat, Taping, Vasopneumatic  device, Ultrasound, Ionotophoresis 4mg /ml Dexamethasone, Manual therapy, and Re-evaluation  PLAN FOR NEXT SESSION: review and progress exercises; continue with postural correction and education; manual work, DN, modalities as indicated    [employer], PT 06/07/2023, 12:33 PM

## 2023-06-08 ENCOUNTER — Ambulatory Visit: Payer: BC Managed Care – PPO

## 2023-06-08 DIAGNOSIS — R29898 Other symptoms and signs involving the musculoskeletal system: Secondary | ICD-10-CM

## 2023-06-08 DIAGNOSIS — M25511 Pain in right shoulder: Secondary | ICD-10-CM | POA: Diagnosis not present

## 2023-06-08 DIAGNOSIS — M25512 Pain in left shoulder: Secondary | ICD-10-CM | POA: Diagnosis not present

## 2023-06-08 DIAGNOSIS — M6281 Muscle weakness (generalized): Secondary | ICD-10-CM | POA: Diagnosis not present

## 2023-06-08 DIAGNOSIS — G8929 Other chronic pain: Secondary | ICD-10-CM | POA: Diagnosis not present

## 2023-06-08 NOTE — Therapy (Signed)
OUTPATIENT PHYSICAL THERAPY SHOULDER TREATMENT   Patient Name: Lori King MRN: [id number] DOB:11-Jul-1966, 57 y.o., female Today's Date: 06/08/2023  END OF SESSION:  PT End of Session - 06/08/23 1013     Visit Number 25    Number of Visits 34    Date for PT Re-Evaluation 08/16/23    Authorization Type [employer] [id number]    PT Start Time 1015    PT Stop Time 1115    PT Time Calculation (min) 60 min    Activity Tolerance Patient tolerated treatment well    Behavior During Therapy WFL for tasks assessed/performed             Past Medical History:  Diagnosis Date   Adhesive capsulitis of right shoulder    Allergic rhinitis, seasonal    Anemia    Anxiety    MDD (major depressive disorder)    Wears contact lenses    Past Surgical History:  Procedure Laterality Date   BREAST BIOPSY Left 2010   x2 per pt benign   CESAREAN SECTION  X2  last one 02-28-2001   W/  BILATERAL TUBAL LIGATION w/ last c/s   COLONOSCOPY  11/01/2017   HYSTEROSCOPY W/ ENDOMETRIAL ABLATION  2019   LUMBAR LAMINECTOMY/DECOMPRESSION MICRODISCECTOMY  2013   L4  --- S1   SHOULDER ARTHROSCOPY Right 08/28/2020   Procedure: ARTHROSCOPY SHOULDER lysis of adhesions and manipulation under anesthesia, extensive debridement;  Surgeon: Yolonda Kida, MD;  Location: [facility] [place];  Service: Orthopedics;  Laterality: Right;   There are no problems to display for this patient.   PCP: Dr Angelica Chessman  REFERRING PROVIDER: Dr Arlyce Harman  REFERRING DIAG: pain of Lt shoulder  THERAPY DIAG:  Acute pain of left shoulder  Muscle weakness (generalized)  Other symptoms and signs involving the musculoskeletal system  Chronic right shoulder pain  Rationale for Evaluation and Treatment: Rehabilitation  ONSET DATE: 10/24/22  SUBJECTIVE:                                                                                                                                                                                       SUBJECTIVE STATEMENT: Patient reports her neck is sore today from testing done at previous PT appointment, states shoulder is feeling pretty good today.   03/15/23 RE-EVAL: Patient underwent L shoulder surgery with extensive debridement; labral repair; biceps tendon release; removal of bone spurs 03/14/23.  She has minimal pain due to remaining effects of spinal block for surgery. Bulky dressing limits movement and the effect of ice. Remains in the sling.   Cervical MRI shows degenerative changes C5/6. MD will send order for  cervical spine.   Hand dominance: Right  PERTINENT HISTORY: Rt shoulder pain 3 yrs with shoulder surgery for labrum tear and biceps tendon; adhesive capsulitis; reactive hypoglycemic disease; fatty liver  PAIN:  Are you having pain? Yes: NPRS scale: 2/10 shoulder/biceps Pain location: R shoulder/arm/shoulder blade area  Pain description: sharp; stabbing pain in biceps radiating to elbow with big reaching to the side  Aggravating factors: reaching overhead to the side and behind  Relieving factors: ice; TENs unit   PRECAUTIONS: None  WEIGHT BEARING RESTRICTIONS: No  FALLS:  Has patient fallen in last 6 months? No   PATIENT GOALS:get rid of the shoulder pain and increase ROM   NEXT MD VISIT: 06/09/23  OBJECTIVE:   DIAGNOSTIC FINDINGS:  None available for shoulder; x-ray (-)   02/13/23 MRI Lt shoulder -  some joint space narrowing, subchondral sclerosis, subchondral cyst formation and osteophyte formation in the glenohumeral joint, indicative of osteoarthritis.  POSTURE: Patient presents with head forward posture with increased thoracic kyphosis; shoulders rounded and elevated; scapulae abducted and rotated along the thoracic spine; head of the humerus anterior in orientation.  CERVICAL ROM: 02/02/23 Flexion 43 tight pain Lt upper trap  Extension 48 tight ant/post neck R lateral flexion 36 tight L cervical  L lateral flexion  42 R rotation 71 L rotation 62 tight pain Lt cervical to upper trap     CERVICAL ROM: 06/07/23 Flexion 48 tight pain Lt upper trap  Extension 51 tight post neck R lateral flexion 34 tight L cervical  L lateral flexion 34 tight  R rotation 64 tight  L rotation 62 tight   UPPER EXTREMITY ROM:   Active ROM Right eval Left eval Left  01/11/23 Left  02/02/23 Left(passive) 03/15/23 Left passive 03/29/23  AAROM  04/04/23 AAROM 05/05/23 AAROM  06/07/23  Shoulder flexion 160 112 130 107 pain  80 125 138 148 150  Shoulder extension 59 25 48 31 pain  NT NT   57  Shoulder abduction (scaption) 168 112 133 92 pain  NT 84 92 128 145  Shoulder adduction           Shoulder internal rotation T7 Thumb to waist Thumb  T12 Hand lateral hip pain  NT neutral   Hand to waist   Shoulder external rotation (in scapular plane) 95 85 93 45 pain  NT 70 75 75 70  Elbow flexion     120 WNL     Elbow extension     0 WFL     Wrist flexion           Wrist extension           Wrist ulnar deviation           Wrist radial deviation           Wrist pronation           Wrist supination           (Blank rows = not tested) PASSIVE ROM - supine flexion 152; scaption 147; ER 90   AAROM/PROM supine - 04/19/23: PROM ~ 140 deg flexion; 92 deg abduction in scapular plane; 70 deg ER in scapular plane    UPPER EXTREMITY MMT: strength not assessed - moves Lt UE well against gravity; limited by pain  MMT Right eval Left eval  Shoulder flexion    Shoulder extension    Shoulder abduction    Shoulder adduction    Shoulder internal rotation    Shoulder external rotation  Middle trapezius    Lower trapezius    Elbow flexion    Elbow extension    Wrist flexion    Wrist extension    Wrist ulnar deviation    Wrist radial deviation    Wrist pronation    Wrist supination    Grip strength (lbs)    (Blank rows = not tested) 05/17/23:  Grip - R 37; L 18 Lateral pinch R 4.5; L 1.5 Three jaw: R 3.5; L .5     05/26/23:   Grip - R 37; L 26 Lateral pinch R 4.5; L 2 Three jaw: R 3.5; L .6     06/07/23:  Grip - R 37; L 33 Lateral pinch R 4.5; L 4 Three jaw: R 3.5; L 3.5     JOINT MOBILITY TESTING:  Significant joint tightness Lt shoulder in all planes  02/02/23: Significant joint tightness Lt shoulder in all planes 03/15/23: NT  UPPER LIMB TENSION TEST:  Positive L with patient unable to achieve 90 deg shoulder abduction for testing. UE in ~ 60 deg abduction with elbow extension produced radicular symptoms into Lt UE   PALPATION:  Muscular tightness Lt shoulder through the upper trap; pecs; leveator; teres 02/02/23: muscular tightness Lt shoulder girdle in upper trap; pecs; leveator; rhomboids; teres 02/09/23: muscular tightness Lt cervical and upper trap area through the shoulder girdle including pecs; leveator; rhomboids; teres 02/15/23: persistent tightness L cervical and upper trap area  03/15/23: muscular tightness L shoulder girdle  06/07/23: persistent muscular tightness L cervical area into upper trap and biceps   04/25/23 - Observations:  Patient has poor movement quality with over activation of L upper trap with all active movement of L UE  06/07/23: good improvement in quality of movement L UE with increased activation and control of posterior shoulders girdle    [facility] Adult PT Treatment:                                                DATE: 06/08/2023 Therapeutic Exercise: Pulleys: flexion, scaption, horiz abd/add Self-massage with soft spiky ball --> L UT, rhomboids, mid traps Standing bicep stretch at table --> added lateral head tilt Sitting on palm --> UT stretch x30" Modified chest expansion (palms on wall, standing forward over feet) Supine elbow lifts 2#DB --> ribcage stabilization --> elbow circles Manual Therapy: Seated STM/TPR (L) UT/LS --> pin & stretch UT Modalities: Moist heat shoulder/neck x 15     Treatment                                          DATE:  06/07/2023 Reassessment and measurements for MD progress note today  Therapeutic Exercise: Pulley  Flexion (PT holding upper trap to decrease scapular elevation last few reps) Horizontal ab/adduction with pulley scap squeeze  Standing  Biceps stretch at table 30 sec x 3  Chest lift/scap squeeze  Shoulder horizontal abduction stretch at wall 20 sec x 3  Corner stretch low,mid, high range position  Sitting  Biceps stretch hands behind back 20 sec x 2  Supine  T position for stretch through pecs and UE 10-20 sec x 3 Trunk rotation for stretch for pecs 20-30 sec x 4 Hands behind head for stretch into ER  Neuromuscular  re-education: (HEP)  Patient working in sitting - improve smooth control and improve muscular activation with decreased use of L upper trap with active assistive shoulder elevation. Working to facilitate posterior shoulder girdle musculature   Manual Therapy: Working on PROM and stretching with PT providing inhibition of upper trap for range into elevation focus on shoulder extension  Soft tissue mobilization and trigger point work through the shoulder girdle musculature upper trap and leveator; biceps; periscapular musculature; pecs Scapular mobilization                                              DATE: 06/02/2023 Therapeutic Exercise: Pulley  Flexion (PT holding upper trap to decrease scapular elevation last few reps) Horizontal ab/adduction with pulley scap squeeze  Standing Scap  squeezes Row isometric green progressing to blue TB 3 sec x 10  Row blue TB 3 sec x 10 ER isometric green TB 3 sec x 10 ER red TB 3 sec x 10  IR isometric red TB 3 sec x 10  IR red TB 3 sec x 10  Chest lift/scap squeeze  Shoulder horizontal abduction stretch at wall 20 sec x 3  Corner stretch low,mid, high range position  Supine (HEP) T position for stretch through pecs and UE 10-20 sec x 3 Trunk rotation for stretch for pecs 20-30 sec x 4 Quadruped (HEP) Cat cow x 5  Child's pose  to stretch into shoulder flexion 20 sec x 3 Thread the needle 10 sec x 2 L  Prone (HEP)  Shoulder extension 3 sec x 2 Axial extension with scap squeeze 3 sec x 6  Neuromuscular re-education: (HEP)  Patient working in sitting - improve smooth control and improve muscular activation with decreased use of L upper trap with active assistive shoulder elevation. Working to facilitate posterior shoulder girdle musculature   Manual Therapy: Working on PROM and stretching with PT providing inhibition of upper trap for range into elevation focus on shoulder extension  Soft tissue mobilization and trigger point work through the shoulder girdle musculature upper trap and leveator; biceps; periscapular musculature; Optometrist   PATIENT EDUCATION: Education details: POC; HEP Person educated: Patient Education method: Programmer, multimedia, Facilities manager, Actor cues, Verbal cues, and Handouts Education comprehension: verbalized understanding, returned demonstration, verbal cues required, tactile cues required, and needs further education  HOME EXERCISE PROGRAM: (from treatment prior to surgery 03/14/23)  E-mailed to: [email or web address]   Access Code: [email or web address] URL: [email or web address] Date: 01/13/2023 Prepared by: Corlis Leak  Exercises - Seated Shoulder Flexion AAROM with Pulley Behind  - 2 x daily - 7 x weekly - 1 sets - 10 reps - 10 sec  hold - Seated Shoulder Scaption AAROM with Pulley at Side  - 2 x daily - 7 x weekly - 1 sets - 10 reps - 10sec  hold - Seated Cervical Retraction  - 2 x daily - 7 x weekly - 1-2 sets - 5-10 reps - 10 sec  hold - Seated Scapular Retraction  - 2 x daily - 7 x weekly - 1-2 sets - 10 reps - 10 sec  hold - Standing Scapular Retraction  - 3 x daily - 7 x weekly - 1 sets - 10 reps - 10 sec  hold - Shoulder External Rotation and Scapular Retraction  - 3 x daily - 7 x weekly - 1 sets - 10 reps - 3-5  sec   hold - Seated Shoulder W  - 2 x daily - 7 x  weekly - 1 sets - 10 reps - 3 sec  hold - Shoulder External Rotation and Scapular Retraction with Resistance  - 2 x daily - 7 x weekly - 1 sets - 10 reps - 3-5 sec  hold - Shoulder W - External Rotation with Resistance  - 2 x daily - 7 x weekly - 1-2 sets - 10 reps - 3 sec  hold - Standing Bilateral Low Shoulder Row with Anchored Resistance  - 2 x daily - 7 x weekly - 1-3 sets - 10 reps - 2-3 sec  hold - Drawing Bow  - 1 x daily - 7 x weekly - 1 sets - 10 reps - 3 sec  hold - Prone Scapular Retraction  - 2 x daily - 7 x weekly - 1 sets - 5-10 reps - 3-5 sec  hold - Cat Cow  - 2 x daily - 7 x weekly - 1 sets - 5-10 reps - 3-5 sec  hold - Cat Cow to Child's Pose  - 2 x daily - 7 x weekly - 1 sets - 3 reps - 20-30 sec  hold  ASSESSMENT:  CLINICAL IMPRESSION: Patient instructed in self-myofascial release with ball in standing, targeting upper trapezius and medial border of scapular region. Postural exercises continued, incorporating pilates-based exercises with tactile cueing to improve shoulder stabilization.              04/21/23: ESI went well 04/20/23 and she can see improvement in the L UE symptoms. Manual work decreases muscular tightness and patient does tolerated PROM/stretching better. She continues to have tight but empty end feel. Note some improving muscular tightness through L shoulder girdle with persistent tightness in [facility] joint with decreased mobility and stability in the L scapulothoracic range. Patient is working on her exercises and ROM at home.   OBJECTIVE IMPAIRMENTS: Lt shoulder pain which started with lifting overhead her luggage when travelling. She has had gradually increasing pain and stiffness in the Lt shoulder. She received an injection ~ 1-2 weeks ago with some improvement. Patient presents with poor posture and alignment; limited A and PROM Lt shoulder; functional weakness due to pain; decreased functional activities and pain on a daily basis    GOALS: Goals reviewed with  patient? Yes  SHORT TERM GOALS: Target date: 04/26/2023  Independent in initial HEP  Baseline: Goal status: met  2.  Increase AROM shoulder flexion and abduction to 100-110 degrees  Baseline:  Goal status: met   LONG TERM GOALS: Target date: 08/16/2023  Decrease pain in the Lt shoulder by 75-100% allowing patient to return to normal functional activities  Baseline: pain ~ 60% improved Goal status: no going   2.  AROM Lt shoulder WFL's and pain free  Baseline:  Goal status: on going  3.  Patient reports return to normal functional activities including lifting grandson with minimal to no increase in Lt shoulder pain  Baseline:  Goal status: on going   4.  Independent in HEP  Baseline:  Goal status: on going   PLAN:  PT FREQUENCY: 1-2x/week  PT DURATION: 10 weeks  PLANNED INTERVENTIONS: Therapeutic exercises, Therapeutic activity, Neuromuscular re-education, Patient/Family education, Self Care, Joint mobilization, Aquatic Therapy, Dry Needling, Electrical stimulation, Spinal mobilization, Cryotherapy, Moist heat, Taping, Vasopneumatic device, Ultrasound, Ionotophoresis 4mg /ml Dexamethasone, Manual therapy, and Re-evaluation  PLAN FOR NEXT SESSION: review and progress exercises; continue with postural correction and  education; manual work, DN, modalities as indicated    Sanjuana Mae, PTA 06/08/2023, 11:34 AM

## 2023-06-14 ENCOUNTER — Encounter: Payer: Self-pay | Admitting: Rehabilitative and Restorative Service Providers"

## 2023-06-14 ENCOUNTER — Ambulatory Visit: Payer: BC Managed Care – PPO | Admitting: Rehabilitative and Restorative Service Providers"

## 2023-06-14 DIAGNOSIS — M25512 Pain in left shoulder: Secondary | ICD-10-CM

## 2023-06-14 DIAGNOSIS — G8929 Other chronic pain: Secondary | ICD-10-CM | POA: Diagnosis not present

## 2023-06-14 DIAGNOSIS — R29898 Other symptoms and signs involving the musculoskeletal system: Secondary | ICD-10-CM | POA: Diagnosis not present

## 2023-06-14 DIAGNOSIS — M25511 Pain in right shoulder: Secondary | ICD-10-CM | POA: Diagnosis not present

## 2023-06-14 DIAGNOSIS — M6281 Muscle weakness (generalized): Secondary | ICD-10-CM

## 2023-06-14 NOTE — Therapy (Signed)
OUTPATIENT PHYSICAL THERAPY SHOULDER TREATMENT   Patient Name: Lori King MRN: [id number] DOB:1965-11-19, 57 y.o., female Today's Date: 06/14/2023  END OF SESSION:  PT End of Session - 06/14/23 1145     Visit Number 26    Number of Visits 34    Date for PT Re-Evaluation 08/16/23    Authorization Type [employer] [id number]    PT Start Time 1145    PT Stop Time 1234    PT Time Calculation (min) 49 min    Activity Tolerance Patient tolerated treatment well             Past Medical History:  Diagnosis Date   Adhesive capsulitis of right shoulder    Allergic rhinitis, seasonal    Anemia    Anxiety    MDD (major depressive disorder)    Wears contact lenses    Past Surgical History:  Procedure Laterality Date   BREAST BIOPSY Left 2010   x2 per pt benign   CESAREAN SECTION  X2  last one 02-28-2001   W/  BILATERAL TUBAL LIGATION w/ last c/s   COLONOSCOPY  11/01/2017   HYSTEROSCOPY W/ ENDOMETRIAL ABLATION  2019   LUMBAR LAMINECTOMY/DECOMPRESSION MICRODISCECTOMY  2013   L4  --- S1   SHOULDER ARTHROSCOPY Right 08/28/2020   Procedure: ARTHROSCOPY SHOULDER lysis of adhesions and manipulation under anesthesia, extensive debridement;  Surgeon: Yolonda Kida, MD;  Location: [facility];  Service: Orthopedics;  Laterality: Right;   There are no problems to display for this patient.   PCP: Dr Angelica Chessman  REFERRING PROVIDER: Dr Arlyce Harman  REFERRING DIAG: pain of Lt shoulder  THERAPY DIAG:  Acute pain of left shoulder  Muscle weakness (generalized)  Other symptoms and signs involving the musculoskeletal system  Chronic right shoulder pain  Rationale for Evaluation and Treatment: Rehabilitation  ONSET DATE: 10/24/22  SUBJECTIVE:                                                                                                                                                                                      SUBJECTIVE STATEMENT: MD  was pleased with her progress and would like for her to continue with PT for several more weeks. Patient reports that she is noticing improvement in her mobility and use of L UE. Has continued to decrease meds gradually.   03/15/23 RE-EVAL: Patient underwent L shoulder surgery with extensive debridement; labral repair; biceps tendon release; removal of bone spurs 03/14/23.  She has minimal pain due to remaining effects of spinal block for surgery. Bulky dressing limits movement and the effect of ice. Remains in the sling.   Cervical MRI  shows degenerative changes C5/6. MD will send order for cervical spine.   Hand dominance: Right  PERTINENT HISTORY: Rt shoulder pain 3 yrs with shoulder surgery for labrum tear and biceps tendon; adhesive capsulitis; reactive hypoglycemic disease; fatty liver  PAIN:  Are you having pain? Yes: NPRS scale: 0/10 shoulder/biceps Pain location: R shoulder/arm/shoulder blade area  Pain description: sharp; aching; stabbing pain in biceps radiating to elbow with big reaching to the side  Aggravating factors: reaching overhead to the side and behind  Relieving factors: ice; TENs unit   PRECAUTIONS: None  WEIGHT BEARING RESTRICTIONS: No  FALLS:  Has patient fallen in last 6 months? No   PATIENT GOALS:get rid of the shoulder pain and increase ROM   NEXT MD VISIT: 06/09/23  OBJECTIVE:   DIAGNOSTIC FINDINGS:  None available for shoulder; x-ray (-)   02/13/23 MRI Lt shoulder -  some joint space narrowing, subchondral sclerosis, subchondral cyst formation and osteophyte formation in the glenohumeral joint, indicative of osteoarthritis.  POSTURE: Patient presents with head forward posture with increased thoracic kyphosis; shoulders rounded and elevated; scapulae abducted and rotated along the thoracic spine; head of the humerus anterior in orientation.  CERVICAL ROM: 02/02/23 Flexion 43 tight pain Lt upper trap  Extension 48 tight ant/post neck R lateral  flexion 36 tight L cervical  L lateral flexion 42 R rotation 71 L rotation 62 tight pain Lt cervical to upper trap     CERVICAL ROM: 06/07/23 Flexion 48 tight pain Lt upper trap  Extension 51 tight post neck R lateral flexion 34 tight L cervical  L lateral flexion 34 tight  R rotation 64 tight  L rotation 62 tight   UPPER EXTREMITY ROM:   Active ROM Right eval Left eval Left  01/11/23 Left  02/02/23 Left(passive) 03/15/23 Left passive 03/29/23  AAROM  04/04/23 AAROM 05/05/23 AAROM  06/07/23  Shoulder flexion 160 112 130 107 pain  80 125 138 148 150  Shoulder extension 59 25 48 31 pain  NT NT   57  Shoulder abduction (scaption) 168 112 133 92 pain  NT 84 92 128 145  Shoulder adduction           Shoulder internal rotation T7 Thumb to waist Thumb  T12 Hand lateral hip pain  NT neutral   Hand to waist   Shoulder external rotation (in scapular plane) 95 85 93 45 pain  NT 70 75 75 70  Elbow flexion     120 WNL     Elbow extension     0 WFL     Wrist flexion           Wrist extension           Wrist ulnar deviation           Wrist radial deviation           Wrist pronation           Wrist supination           (Blank rows = not tested) PASSIVE ROM - supine flexion 152; scaption 147; ER 90   AAROM/PROM supine - 04/19/23: PROM ~ 140 deg flexion; 92 deg abduction in scapular plane; 70 deg ER in scapular plane    UPPER EXTREMITY MMT: strength not assessed - moves Lt UE well against gravity; limited by pain  MMT Right eval Left eval  Shoulder flexion    Shoulder extension    Shoulder abduction    Shoulder adduction  Shoulder internal rotation    Shoulder external rotation    Middle trapezius    Lower trapezius    Elbow flexion    Elbow extension    Wrist flexion    Wrist extension    Wrist ulnar deviation    Wrist radial deviation    Wrist pronation    Wrist supination    Grip strength (lbs)    (Blank rows = not tested) 05/17/23:  Grip - R 37; L 18 Lateral pinch R 4.5;  L 1.5 Three jaw: R 3.5; L .5     05/26/23:  Grip - R 37; L 26 Lateral pinch R 4.5; L 2 Three jaw: R 3.5; L .6     06/07/23:  Grip - R 37; L 33 Lateral pinch R 4.5; L 4 Three jaw: R 3.5; L 3.5     JOINT MOBILITY TESTING:  Significant joint tightness Lt shoulder in all planes  02/02/23: Significant joint tightness Lt shoulder in all planes 03/15/23: NT  UPPER LIMB TENSION TEST:  Positive L with patient unable to achieve 90 deg shoulder abduction for testing. UE in ~ 60 deg abduction with elbow extension produced radicular symptoms into Lt UE   PALPATION:  Muscular tightness Lt shoulder through the upper trap; pecs; leveator; teres 02/02/23: muscular tightness Lt shoulder girdle in upper trap; pecs; leveator; rhomboids; teres 02/09/23: muscular tightness Lt cervical and upper trap area through the shoulder girdle including pecs; leveator; rhomboids; teres 02/15/23: persistent tightness L cervical and upper trap area  03/15/23: muscular tightness L shoulder girdle  06/07/23: persistent muscular tightness L cervical area into upper trap and biceps   04/25/23 - Observations:  Patient has poor movement quality with over activation of L upper trap with all active movement of L UE  06/07/23: good improvement in quality of movement L UE with increased activation and control of posterior shoulders girdle  Treatment                                          DATE: 06/14/2023 Therapeutic Exercise: Pulley  Flexion (PT holding upper trap to decrease scapular elevation last few reps) Horizontal ab/adduction with pulley scap squeeze  Standing  Biceps stretch at table 30 sec x 3  Chest lift/scap squeeze  Shoulder horizontal abduction stretch at wall 20 sec x 3  Corner stretch low, mid, high range position 10-20 sec x 2  Supine  T position for stretch through pecs and UE 10-20 sec x 3 Trunk rotation for stretch for pecs 20-30 sec x 4 Hands behind head for stretch into ER  Neuromuscular re-education:  (HEP)  Patient working in sitting - improve smooth control and improve muscular activation with decreased use of L upper trap with active assistive shoulder elevation. Working to facilitate posterior shoulder girdle musculature   Manual Therapy: Soft tissue mobilization and trigger point work through L cervical musculature and shoulder girdle including  upper trap and leveator; biceps; periscapular musculature; pecs PROM and stretching shoulder flexion; scaption; extension; ER  Scapular mobilization  [facility] Adult PT Treatment:                                                DATE: 06/08/2023 Therapeutic Exercise: Pulleys: flexion, scaption, horiz abd/add Self-massage  with soft spiky ball --> L UT, rhomboids, mid traps Standing bicep stretch at table --> added lateral head tilt Sitting on palm --> UT stretch x30" Modified chest expansion (palms on wall, standing forward over feet) Supine elbow lifts 2#DB --> ribcage stabilization --> elbow circles Manual Therapy: Seated STM/TPR (L) UT/LS --> pin & stretch UT Modalities: Moist heat shoulder/neck x 15   Treatment                                          DATE: 06/07/2023 Reassessment and measurements for MD progress note today  Therapeutic Exercise: Pulley  Flexion (PT holding upper trap to decrease scapular elevation last few reps) Horizontal ab/adduction with pulley scap squeeze  Standing  Biceps stretch at table 30 sec x 3  Chest lift/scap squeeze  Shoulder horizontal abduction stretch at wall 20 sec x 3  Corner stretch low,mid, high range position  Sitting  Biceps stretch hands behind back 20 sec x 2  Supine  T position for stretch through pecs and UE 10-20 sec x 3 Trunk rotation for stretch for pecs 20-30 sec x 4 Hands behind head for stretch into ER  Neuromuscular re-education: (HEP)  Patient working in sitting - improve smooth control and improve muscular activation with decreased use of L upper trap with active assistive  shoulder elevation. Working to facilitate posterior shoulder girdle musculature   Manual Therapy: Working on PROM and stretching with PT providing inhibition of upper trap for range into elevation focus on shoulder extension  Soft tissue mobilization and trigger point work through the shoulder girdle musculature upper trap and leveator; biceps; periscapular musculature; pecs Scapular mobilization                                              DATE: 06/02/2023 Therapeutic Exercise: Pulley  Flexion (PT holding upper trap to decrease scapular elevation last few reps) Horizontal ab/adduction with pulley scap squeeze  Standing Scap  squeezes Row isometric green progressing to blue TB 3 sec x 10  Row blue TB 3 sec x 10 ER isometric green TB 3 sec x 10 ER red TB 3 sec x 10  IR isometric red TB 3 sec x 10  IR red TB 3 sec x 10  Chest lift/scap squeeze  Shoulder horizontal abduction stretch at wall 20 sec x 3  Corner stretch low,mid, high range position  Supine (HEP) T position for stretch through pecs and UE 10-20 sec x 3 Trunk rotation for stretch for pecs 20-30 sec x 4 Quadruped (HEP) Cat cow x 5  Child's pose to stretch into shoulder flexion 20 sec x 3 Thread the needle 10 sec x 2 L  Prone (HEP)  Shoulder extension 3 sec x 2 Axial extension with scap squeeze 3 sec x 6  Neuromuscular re-education: (HEP)  Patient working in sitting - improve smooth control and improve muscular activation with decreased use of L upper trap with active assistive shoulder elevation. Working to facilitate posterior shoulder girdle musculature   Manual Therapy: Working on PROM and stretching with PT providing inhibition of upper trap for range into elevation focus on shoulder extension  Soft tissue mobilization and trigger point work through the shoulder girdle musculature upper trap and leveator; biceps; periscapular musculature; pecs  Scapular mobilization   PATIENT EDUCATION: Education details: POC;  HEP Person educated: Patient Education method: Explanation, Demonstration, Tactile cues, Verbal cues, and Handouts Education comprehension: verbalized understanding, returned demonstration, verbal cues required, tactile cues required, and needs further education  HOME EXERCISE PROGRAM: (from treatment prior to surgery 03/14/23)  E-mailed to: [email or web address]   Access Code: [email or web address] URL: [email or web address] Date: 01/13/2023 Prepared by: Corlis Leak  Exercises - Seated Shoulder Flexion AAROM with Pulley Behind  - 2 x daily - 7 x weekly - 1 sets - 10 reps - 10 sec  hold - Seated Shoulder Scaption AAROM with Pulley at Side  - 2 x daily - 7 x weekly - 1 sets - 10 reps - 10sec  hold - Seated Cervical Retraction  - 2 x daily - 7 x weekly - 1-2 sets - 5-10 reps - 10 sec  hold - Seated Scapular Retraction  - 2 x daily - 7 x weekly - 1-2 sets - 10 reps - 10 sec  hold - Standing Scapular Retraction  - 3 x daily - 7 x weekly - 1 sets - 10 reps - 10 sec  hold - Shoulder External Rotation and Scapular Retraction  - 3 x daily - 7 x weekly - 1 sets - 10 reps - 3-5 sec   hold - Seated Shoulder W  - 2 x daily - 7 x weekly - 1 sets - 10 reps - 3 sec  hold - Shoulder External Rotation and Scapular Retraction with Resistance  - 2 x daily - 7 x weekly - 1 sets - 10 reps - 3-5 sec  hold - Shoulder W - External Rotation with Resistance  - 2 x daily - 7 x weekly - 1-2 sets - 10 reps - 3 sec  hold - Standing Bilateral Low Shoulder Row with Anchored Resistance  - 2 x daily - 7 x weekly - 1-3 sets - 10 reps - 2-3 sec  hold - Drawing Bow  - 1 x daily - 7 x weekly - 1 sets - 10 reps - 3 sec  hold - Prone Scapular Retraction  - 2 x daily - 7 x weekly - 1 sets - 5-10 reps - 3-5 sec  hold - Cat Cow  - 2 x daily - 7 x weekly - 1 sets - 5-10 reps - 3-5 sec  hold - Cat Cow to Child's Pose  - 2 x daily - 7 x weekly - 1 sets - 3 reps - 20-30 sec  hold  ASSESSMENT:  CLINICAL IMPRESSION: Continued  improvement in shoulder mobility/ROM with manual work and PROM/stretching. Will try decreasing treatment to 1x/wk and continue focus on ROM and strength.              04/21/23: ESI went well 04/20/23 and she can see improvement in the L UE symptoms. Manual work decreases muscular tightness and patient does tolerated PROM/stretching better. She continues to have tight but empty end feel. Note some improving muscular tightness through L shoulder girdle with persistent tightness in [facility] joint with decreased mobility and stability in the L scapulothoracic range. Patient is working on her exercises and ROM at home.   OBJECTIVE IMPAIRMENTS: Lt shoulder pain which started with lifting overhead her luggage when travelling. She has had gradually increasing pain and stiffness in the Lt shoulder. She received an injection ~ 1-2 weeks ago with some improvement. Patient presents with poor posture and alignment; limited A and PROM Lt shoulder; functional weakness due to pain;  decreased functional activities and pain on a daily basis    GOALS: Goals reviewed with patient? Yes  SHORT TERM GOALS: Target date: 04/26/2023  Independent in initial HEP  Baseline: Goal status: met  2.  Increase AROM shoulder flexion and abduction to 100-110 degrees  Baseline:  Goal status: met   LONG TERM GOALS: Target date: 08/16/2023  Decrease pain in the Lt shoulder by 75-100% allowing patient to return to normal functional activities  Baseline: pain ~ 60% improved Goal status: no going   2.  AROM Lt shoulder WFL's and pain free  Baseline:  Goal status: on going  3.  Patient reports return to normal functional activities including lifting grandson with minimal to no increase in Lt shoulder pain  Baseline:  Goal status: on going   4.  Independent in HEP  Baseline:  Goal status: on going   PLAN:  PT FREQUENCY: 1-2x/week  PT DURATION: 10 weeks  PLANNED INTERVENTIONS: Therapeutic exercises, Therapeutic activity,  Neuromuscular re-education, Patient/Family education, Self Care, Joint mobilization, Aquatic Therapy, Dry Needling, Electrical stimulation, Spinal mobilization, Cryotherapy, Moist heat, Taping, Vasopneumatic device, Ultrasound, Ionotophoresis 4mg /ml Dexamethasone, Manual therapy, and Re-evaluation  PLAN FOR NEXT SESSION: review and progress exercises; continue with postural correction and education; manual work, DN, modalities as indicated    [employer], PT 06/14/2023, 11:46 AM

## 2023-06-16 ENCOUNTER — Encounter: Payer: Self-pay | Admitting: Rehabilitative and Restorative Service Providers"

## 2023-06-16 ENCOUNTER — Ambulatory Visit: Payer: BC Managed Care – PPO | Admitting: Rehabilitative and Restorative Service Providers"

## 2023-06-16 DIAGNOSIS — M25512 Pain in left shoulder: Secondary | ICD-10-CM

## 2023-06-16 DIAGNOSIS — M6281 Muscle weakness (generalized): Secondary | ICD-10-CM | POA: Diagnosis not present

## 2023-06-16 DIAGNOSIS — R29898 Other symptoms and signs involving the musculoskeletal system: Secondary | ICD-10-CM | POA: Diagnosis not present

## 2023-06-16 DIAGNOSIS — M25511 Pain in right shoulder: Secondary | ICD-10-CM | POA: Diagnosis not present

## 2023-06-16 DIAGNOSIS — G8929 Other chronic pain: Secondary | ICD-10-CM | POA: Diagnosis not present

## 2023-06-16 NOTE — Therapy (Signed)
OUTPATIENT PHYSICAL THERAPY SHOULDER TREATMENT   Patient Name: Lori King MRN: [id number] DOB:[birth year], 57 y.o., female Today's Date: 06/16/2023  END OF SESSION:  PT End of Session - 06/16/23 1017     Visit Number 27    Number of Visits 34    Date for PT Re-Evaluation 08/16/23    Authorization Type [employer] [id number]    PT Start Time 1016    PT Stop Time 1104    PT Time Calculation (min) 48 min    Activity Tolerance Patient tolerated treatment well             Past Medical History:  Diagnosis Date   Adhesive capsulitis of right shoulder    Allergic rhinitis, seasonal    Anemia    Anxiety    MDD (major depressive disorder)    Wears contact lenses    Past Surgical History:  Procedure Laterality Date   BREAST BIOPSY Left 2010   x2 per pt benign   CESAREAN SECTION  X2  last one 02-28-2001   W/  BILATERAL TUBAL LIGATION w/ last c/s   COLONOSCOPY  11/01/2017   HYSTEROSCOPY W/ ENDOMETRIAL ABLATION  2019   LUMBAR LAMINECTOMY/DECOMPRESSION MICRODISCECTOMY  2013   L4  --- S1   SHOULDER ARTHROSCOPY Right 08/28/2020   Procedure: ARTHROSCOPY SHOULDER lysis of adhesions and manipulation under anesthesia, extensive debridement;  Surgeon: Yolonda Kida, MD;  Location: [facility];  Service: Orthopedics;  Laterality: Right;   There are no problems to display for this patient.   PCP: Dr Angelica Chessman  REFERRING PROVIDER: Dr Arlyce Harman  REFERRING DIAG: pain of Lt shoulder  THERAPY DIAG:  Acute pain of left shoulder  Muscle weakness (generalized)  Other symptoms and signs involving the musculoskeletal system  Chronic right shoulder pain  Rationale for Evaluation and Treatment: Rehabilitation  ONSET DATE: 10/24/22  SUBJECTIVE:                                                                                                                                                                                      SUBJECTIVE  STATEMENT: Patient reports that her mom is hospitalized following a fall due to diverticulitis. Patient reports that she slept on her shoulder a little last night and awoke with some discomfort but it is OK. She has been working Scientist, physiological and yoga stretches.   03/15/23 RE-EVAL: Patient underwent L shoulder surgery with extensive debridement; labral repair; biceps tendon release; removal of bone spurs 03/14/23.  She has minimal pain due to remaining effects of spinal block for surgery. Bulky dressing limits movement and the effect of ice. Remains in the sling.  Cervical MRI shows degenerative changes C5/6. MD will send order for cervical spine.   Hand dominance: Right  PERTINENT HISTORY: Rt shoulder pain 3 yrs with shoulder surgery for labrum tear and biceps tendon; adhesive capsulitis; reactive hypoglycemic disease; fatty liver  PAIN:  Are you having pain? Yes: NPRS scale: 1/10 shoulder/biceps Pain location: R shoulder/arm/shoulder blade area  Pain description: sharp; aching; stabbing pain in biceps radiating to elbow with big reaching to the side  Aggravating factors: reaching overhead to the side and behind  Relieving factors: ice; TENs unit   PRECAUTIONS: None  WEIGHT BEARING RESTRICTIONS: No  FALLS:  Has patient fallen in last 6 months? No   PATIENT GOALS:get rid of the shoulder pain and increase ROM   NEXT MD VISIT: 06/09/23  OBJECTIVE:   DIAGNOSTIC FINDINGS:  None available for shoulder; x-ray (-)   02/13/23 MRI Lt shoulder -  some joint space narrowing, subchondral sclerosis, subchondral cyst formation and osteophyte formation in the glenohumeral joint, indicative of osteoarthritis.  POSTURE: Patient presents with head forward posture with increased thoracic kyphosis; shoulders rounded and elevated; scapulae abducted and rotated along the thoracic spine; head of the humerus anterior in orientation.  CERVICAL ROM: 02/02/23 Flexion 43 tight pain Lt upper trap   Extension 48 tight ant/post neck R lateral flexion 36 tight L cervical  L lateral flexion 42 R rotation 71 L rotation 62 tight pain Lt cervical to upper trap     CERVICAL ROM: 06/07/23 Flexion 48 tight pain Lt upper trap  Extension 51 tight post neck R lateral flexion 34 tight L cervical  L lateral flexion 34 tight  R rotation 64 tight  L rotation 62 tight   UPPER EXTREMITY ROM:   Active ROM Right eval Left eval Left  01/11/23 Left  02/02/23 Left(passive) 03/15/23 Left passive 03/29/23  AAROM  04/04/23 AAROM 05/05/23 AAROM  06/07/23  Shoulder flexion 160 112 130 107 pain  80 125 138 148 150  Shoulder extension 59 25 48 31 pain  NT NT   57  Shoulder abduction (scaption) 168 112 133 92 pain  NT 84 92 128 145  Shoulder adduction           Shoulder internal rotation T7 Thumb to waist Thumb  T12 Hand lateral hip pain  NT neutral   Hand to waist   Shoulder external rotation (in scapular plane) 95 85 93 45 pain  NT 70 75 75 70  Elbow flexion     120 WNL     Elbow extension     0 WFL     Wrist flexion           Wrist extension           Wrist ulnar deviation           Wrist radial deviation           Wrist pronation           Wrist supination           (Blank rows = not tested) PASSIVE ROM - supine flexion 152; scaption 147; ER 90   AAROM/PROM supine - 04/19/23: PROM ~ 140 deg flexion; 92 deg abduction in scapular plane; 70 deg ER in scapular plane    UPPER EXTREMITY MMT: strength not assessed - moves Lt UE well against gravity; limited by pain  MMT Right eval Left eval  Shoulder flexion    Shoulder extension    Shoulder abduction    Shoulder adduction  Shoulder internal rotation    Shoulder external rotation    Middle trapezius    Lower trapezius    Elbow flexion    Elbow extension    Wrist flexion    Wrist extension    Wrist ulnar deviation    Wrist radial deviation    Wrist pronation    Wrist supination    Grip strength (lbs)    (Blank rows = not  tested) 05/17/23:  Grip - R 37; L 18 Lateral pinch R 4.5; L 1.5 Three jaw: R 3.5; L .5     05/26/23:  Grip - R 37; L 26 Lateral pinch R 4.5; L 2 Three jaw: R 3.5; L .6     06/07/23:  Grip - R 37; L 33 Lateral pinch R 4.5; L 4 Three jaw: R 3.5; L 3.5     JOINT MOBILITY TESTING:  Significant joint tightness Lt shoulder in all planes  02/02/23: Significant joint tightness Lt shoulder in all planes 03/15/23: NT  UPPER LIMB TENSION TEST:  Positive L with patient unable to achieve 90 deg shoulder abduction for testing. UE in ~ 60 deg abduction with elbow extension produced radicular symptoms into Lt UE   PALPATION:  Muscular tightness Lt shoulder through the upper trap; pecs; leveator; teres 02/02/23: muscular tightness Lt shoulder girdle in upper trap; pecs; leveator; rhomboids; teres 02/09/23: muscular tightness Lt cervical and upper trap area through the shoulder girdle including pecs; leveator; rhomboids; teres 02/15/23: persistent tightness L cervical and upper trap area  03/15/23: muscular tightness L shoulder girdle  06/07/23: persistent muscular tightness L cervical area into upper trap and biceps   04/25/23 - Observations:  Patient has poor movement quality with over activation of L upper trap with all active movement of L UE  06/07/23: good improvement in quality of movement L UE with increased activation and control of posterior shoulders girdle  Treatment                                          DATE: 06/16/2023 Therapeutic Exercise: Pulley  Flexion (PT holding upper trap to decrease scapular elevation last few reps) Horizontal ab/adduction with pulley scap squeeze  Standing  Biceps stretch at table 30 sec x 3  Corner stretch low, mid, high range position 10-20 sec x 2  Doorway stretch 30 sec low middle high x 2  Chest lift/scap squeeze  Shoulder horizontal abduction stretch at wall 20 sec x 3   Sidelying  ER 2# DB x 10 x 2  Supine  Hands behind head for stretch into ER   Neuromuscular re-education: (HEP)  Patient working in standing - improve smooth control and improve muscular activation with decreased use of L upper trap with active assistive shoulder elevation. Working to facilitate posterior shoulder girdle musculature   Manual Therapy: Soft tissue mobilization and trigger point work through L cervical musculature and shoulder girdle including  upper trap and leveator; biceps; periscapular musculature; pecs PROM and stretching shoulder flexion; scaption; extension; ER  Scapular mobilization; joint mobs in PA and inferior planes   Treatment                                          DATE: 06/14/2023 Therapeutic Exercise: Pulley  Flexion (PT holding upper trap to  decrease scapular elevation last few reps) Horizontal ab/adduction with pulley scap squeeze  Standing  Biceps stretch at table 30 sec x 3  Chest lift/scap squeeze  Shoulder horizontal abduction stretch at wall 20 sec x 3  Corner stretch low, mid, high range position 10-20 sec x 2  Supine  T position for stretch through pecs and UE 10-20 sec x 3 Trunk rotation for stretch for pecs 20-30 sec x 4 Hands behind head for stretch into ER  Neuromuscular re-education: (HEP)  Patient working in sitting - improve smooth control and improve muscular activation with decreased use of L upper trap with active assistive shoulder elevation. Working to facilitate posterior shoulder girdle musculature   Manual Therapy: Soft tissue mobilization and trigger point work through L cervical musculature and shoulder girdle including  upper trap and leveator; biceps; periscapular musculature; pecs PROM and stretching shoulder flexion; scaption; extension; ER  Scapular mobilization  [facility] Adult PT Treatment:                                                DATE: 06/08/2023 Therapeutic Exercise: Pulleys: flexion, scaption, horiz abd/add Self-massage with soft spiky ball --> L UT, rhomboids, mid traps Standing bicep  stretch at table --> added lateral head tilt Sitting on palm --> UT stretch x30" Modified chest expansion (palms on wall, standing forward over feet) Supine elbow lifts 2#DB --> ribcage stabilization --> elbow circles Manual Therapy: Seated STM/TPR (L) UT/LS --> pin & stretch UT Modalities: Moist heat shoulder/neck x 10 min    PATIENT EDUCATION: Education details: POC; HEP Person educated: Patient Education method: Programmer, multimedia, Demonstration, Tactile cues, Verbal cues, and Handouts Education comprehension: verbalized understanding, returned demonstration, verbal cues required, tactile cues required, and needs further education  HOME EXERCISE PROGRAM: (from treatment prior to surgery 03/14/23)  E-mailed to: [email or web address]   Access Code: [email or web address] URL: [email or web address] Date: 01/13/2023 Prepared by: Corlis Leak  Exercises - Seated Shoulder Flexion AAROM with Pulley Behind  - 2 x daily - 7 x weekly - 1 sets - 10 reps - 10 sec  hold - Seated Shoulder Scaption AAROM with Pulley at Side  - 2 x daily - 7 x weekly - 1 sets - 10 reps - 10sec  hold - Seated Cervical Retraction  - 2 x daily - 7 x weekly - 1-2 sets - 5-10 reps - 10 sec  hold - Seated Scapular Retraction  - 2 x daily - 7 x weekly - 1-2 sets - 10 reps - 10 sec  hold - Standing Scapular Retraction  - 3 x daily - 7 x weekly - 1 sets - 10 reps - 10 sec  hold - Shoulder External Rotation and Scapular Retraction  - 3 x daily - 7 x weekly - 1 sets - 10 reps - 3-5 sec   hold - Seated Shoulder W  - 2 x daily - 7 x weekly - 1 sets - 10 reps - 3 sec  hold - Shoulder External Rotation and Scapular Retraction with Resistance  - 2 x daily - 7 x weekly - 1 sets - 10 reps - 3-5 sec  hold - Shoulder W - External Rotation with Resistance  - 2 x daily - 7 x weekly - 1-2 sets - 10 reps - 3 sec  hold - Standing Bilateral Low Shoulder Row with Anchored Resistance  -  2 x daily - 7 x weekly - 1-3 sets - 10 reps - 2-3 sec  hold -  Drawing Bow  - 1 x daily - 7 x weekly - 1 sets - 10 reps - 3 sec  hold - Prone Scapular Retraction  - 2 x daily - 7 x weekly - 1 sets - 5-10 reps - 3-5 sec  hold - Cat Cow  - 2 x daily - 7 x weekly - 1 sets - 5-10 reps - 3-5 sec  hold - Cat Cow to Child's Pose  - 2 x daily - 7 x weekly - 1 sets - 3 reps - 20-30 sec  hold  ASSESSMENT:  CLINICAL IMPRESSION: Patient continues with joint tightness and shoulder weakness. Good gains in ER with joint mobs and PROM. Continued improvement in shoulder mobility/ROM with manual work and PROM/stretching. Will decrease treatment to 1x/wk and continue focus on ROM and strength.              04/21/23: ESI went well 04/20/23 and she can see improvement in the L UE symptoms. Manual work decreases muscular tightness and patient does tolerated PROM/stretching better. She continues to have tight but empty end feel. Note some improving muscular tightness through L shoulder girdle with persistent tightness in [facility] joint with decreased mobility and stability in the L scapulothoracic range. Patient is working on her exercises and ROM at home.   OBJECTIVE IMPAIRMENTS: Lt shoulder pain which started with lifting overhead her luggage when travelling. She has had gradually increasing pain and stiffness in the Lt shoulder. She received an injection ~ 1-2 weeks ago with some improvement. Patient presents with poor posture and alignment; limited A and PROM Lt shoulder; functional weakness due to pain; decreased functional activities and pain on a daily basis    GOALS: Goals reviewed with patient? Yes  SHORT TERM GOALS: Target date: 04/26/2023  Independent in initial HEP  Baseline: Goal status: met  2.  Increase AROM shoulder flexion and abduction to 100-110 degrees  Baseline:  Goal status: met   LONG TERM GOALS: Target date: 08/16/2023  Decrease pain in the Lt shoulder by 75-100% allowing patient to return to normal functional activities  Baseline: pain ~ 60%  improved Goal status: no going   2.  AROM Lt shoulder WFL's and pain free  Baseline:  Goal status: on going  3.  Patient reports return to normal functional activities including lifting grandson with minimal to no increase in Lt shoulder pain  Baseline:  Goal status: on going   4.  Independent in HEP  Baseline:  Goal status: on going   PLAN:  PT FREQUENCY: 1-2x/week  PT DURATION: 10 weeks  PLANNED INTERVENTIONS: Therapeutic exercises, Therapeutic activity, Neuromuscular re-education, Patient/Family education, Self Care, Joint mobilization, Aquatic Therapy, Dry Needling, Electrical stimulation, Spinal mobilization, Cryotherapy, Moist heat, Taping, Vasopneumatic device, Ultrasound, Ionotophoresis 4mg /ml Dexamethasone, Manual therapy, and Re-evaluation  PLAN FOR NEXT SESSION: review and progress exercises; continue with postural correction and education; manual work, DN, modalities as indicated    [employer], PT 06/16/2023, 10:18 AM

## 2023-06-21 ENCOUNTER — Encounter: Payer: Self-pay | Admitting: Rehabilitative and Restorative Service Providers"

## 2023-06-21 ENCOUNTER — Ambulatory Visit: Payer: BC Managed Care – PPO | Admitting: Rehabilitative and Restorative Service Providers"

## 2023-06-21 DIAGNOSIS — M6281 Muscle weakness (generalized): Secondary | ICD-10-CM

## 2023-06-21 DIAGNOSIS — G8929 Other chronic pain: Secondary | ICD-10-CM

## 2023-06-21 DIAGNOSIS — M25512 Pain in left shoulder: Secondary | ICD-10-CM | POA: Diagnosis not present

## 2023-06-21 DIAGNOSIS — R29898 Other symptoms and signs involving the musculoskeletal system: Secondary | ICD-10-CM

## 2023-06-21 DIAGNOSIS — M25511 Pain in right shoulder: Secondary | ICD-10-CM | POA: Diagnosis not present

## 2023-06-21 NOTE — Therapy (Signed)
OUTPATIENT PHYSICAL THERAPY SHOULDER TREATMENT   Patient Name: Lori King MRN: [id number] DOB:15-Nov-1965, 57 y.o., female Today's Date: 06/21/2023  END OF SESSION:  PT End of Session - 06/21/23 1303     Visit Number 28    Number of Visits 34    Date for PT Re-Evaluation 08/16/23    Authorization Type [employer] [id number]    PT Start Time 1300    PT Stop Time 1350    PT Time Calculation (min) 50 min    Activity Tolerance Patient tolerated treatment well             Past Medical History:  Diagnosis Date   Adhesive capsulitis of right shoulder    Allergic rhinitis, seasonal    Anemia    Anxiety    MDD (major depressive disorder)    Wears contact lenses    Past Surgical History:  Procedure Laterality Date   BREAST BIOPSY Left 2010   x2 per pt benign   CESAREAN SECTION  X2  last one 02-28-2001   W/  BILATERAL TUBAL LIGATION w/ last c/s   COLONOSCOPY  11/01/2017   HYSTEROSCOPY W/ ENDOMETRIAL ABLATION  2019   LUMBAR LAMINECTOMY/DECOMPRESSION MICRODISCECTOMY  2013   L4  --- S1   SHOULDER ARTHROSCOPY Right 08/28/2020   Procedure: ARTHROSCOPY SHOULDER lysis of adhesions and manipulation under anesthesia, extensive debridement;  Surgeon: Yolonda Kida, MD;  Location: [facility] [facility] [place];  Service: Orthopedics;  Laterality: Right;   There are no problems to display for this patient.   PCP: Dr Angelica Chessman  REFERRING PROVIDER: Dr Arlyce Harman  REFERRING DIAG: pain of Lt shoulder  THERAPY DIAG:  Acute pain of left shoulder  Muscle weakness (generalized)  Other symptoms and signs involving the musculoskeletal system  Chronic right shoulder pain  Rationale for Evaluation and Treatment: Rehabilitation  ONSET DATE: 10/24/22  SUBJECTIVE:                                                                                                                                                                                      SUBJECTIVE  STATEMENT: Patient reports that she is sore from the doorway stretches. Feels that he strength is coming along nicely. Mom is home from the hospital and doing okay. She has been working Scientist, physiological and yoga stretches.   03/15/23 RE-EVAL: Patient underwent L shoulder surgery with extensive debridement; labral repair; biceps tendon release; removal of bone spurs 03/14/23.  She has minimal pain due to remaining effects of spinal block for surgery. Bulky dressing limits movement and the effect of ice. Remains in the sling.   Cervical MRI shows degenerative changes C5/6.  MD will send order for cervical spine.   Hand dominance: Right  PERTINENT HISTORY: Rt shoulder pain 3 yrs with shoulder surgery for labrum tear and biceps tendon; adhesive capsulitis; reactive hypoglycemic disease; fatty liver  PAIN:  Are you having pain? Yes: NPRS scale: 0-1/10 shoulder/biceps Pain location: R shoulder/arm/shoulder blade area  Pain description: sharp; aching; stabbing pain in biceps radiating to elbow with big reaching to the side  Aggravating factors: reaching overhead to the side and behind  Relieving factors: ice; TENs unit   PRECAUTIONS: None  WEIGHT BEARING RESTRICTIONS: No  FALLS:  Has patient fallen in last 6 months? No   PATIENT GOALS:get rid of the shoulder pain and increase ROM   NEXT MD VISIT: 06/09/23  OBJECTIVE:   DIAGNOSTIC FINDINGS:  None available for shoulder; x-ray (-)   02/13/23 MRI Lt shoulder -  some joint space narrowing, subchondral sclerosis, subchondral cyst formation and osteophyte formation in the glenohumeral joint, indicative of osteoarthritis.  POSTURE: Patient presents with head forward posture with increased thoracic kyphosis; shoulders rounded and elevated; scapulae abducted and rotated along the thoracic spine; head of the humerus anterior in orientation.  CERVICAL ROM: 02/02/23 Flexion 43 tight pain Lt upper trap  Extension 48 tight ant/post neck R lateral  flexion 36 tight L cervical  L lateral flexion 42 R rotation 71 L rotation 62 tight pain Lt cervical to upper trap     CERVICAL ROM: 06/07/23 Flexion 48 tight pain Lt upper trap  Extension 51 tight post neck R lateral flexion 34 tight L cervical  L lateral flexion 34 tight  R rotation 64 tight  L rotation 62 tight   UPPER EXTREMITY ROM:   Active ROM Right eval Left eval Left  01/11/23 Left  02/02/23 Left(passive) 03/15/23 Left passive 03/29/23  AAROM  04/04/23 AAROM 05/05/23 AAROM  06/07/23  Shoulder flexion 160 112 130 107 pain  80 125 138 148 150  Shoulder extension 59 25 48 31 pain  NT NT   57  Shoulder abduction (scaption) 168 112 133 92 pain  NT 84 92 128 145  Shoulder adduction           Shoulder internal rotation T7 Thumb to waist Thumb  T12 Hand lateral hip pain  NT neutral   Hand to waist   Shoulder external rotation (in scapular plane) 95 85 93 45 pain  NT 70 75 75 70  Elbow flexion     120 WNL     Elbow extension     0 WFL     Wrist flexion           Wrist extension           Wrist ulnar deviation           Wrist radial deviation           Wrist pronation           Wrist supination           (Blank rows = not tested) PASSIVE ROM - supine flexion 152; scaption 147; ER 90   AAROM/PROM supine - 04/19/23: PROM ~ 140 deg flexion; 92 deg abduction in scapular plane; 70 deg ER in scapular plane    UPPER EXTREMITY MMT: strength not assessed - moves Lt UE well against gravity; limited by pain  MMT Right eval Left eval  Shoulder flexion    Shoulder extension    Shoulder abduction    Shoulder adduction    Shoulder internal rotation  Shoulder external rotation    Middle trapezius    Lower trapezius    Elbow flexion    Elbow extension    Wrist flexion    Wrist extension    Wrist ulnar deviation    Wrist radial deviation    Wrist pronation    Wrist supination    Grip strength (lbs)    (Blank rows = not tested) 05/17/23:  Grip - R 37; L 18 Lateral pinch R 4.5;  L 1.5 Three jaw: R 3.5; L .5     05/26/23:  Grip - R 37; L 26 Lateral pinch R 4.5; L 2 Three jaw: R 3.5; L .6     06/07/23:  Grip - R 37; L 33 Lateral pinch R 4.5; L 4 Three jaw: R 3.5; L 3.5     JOINT MOBILITY TESTING:  Significant joint tightness Lt shoulder in all planes  02/02/23: Significant joint tightness Lt shoulder in all planes 03/15/23: NT  UPPER LIMB TENSION TEST:  Positive L with patient unable to achieve 90 deg shoulder abduction for testing. UE in ~ 60 deg abduction with elbow extension produced radicular symptoms into Lt UE   PALPATION:  Muscular tightness Lt shoulder through the upper trap; pecs; leveator; teres 02/02/23: muscular tightness Lt shoulder girdle in upper trap; pecs; leveator; rhomboids; teres 02/09/23: muscular tightness Lt cervical and upper trap area through the shoulder girdle including pecs; leveator; rhomboids; teres 02/15/23: persistent tightness L cervical and upper trap area  03/15/23: muscular tightness L shoulder girdle  06/07/23: persistent muscular tightness L cervical area into upper trap and biceps   04/25/23 - Observations:  Patient has poor movement quality with over activation of L upper trap with all active movement of L UE  06/07/23: good improvement in quality of movement L UE with increased activation and control of posterior shoulders girdle  Treatment                                          DATE: 06/21/2023 Therapeutic Exercise: Pulley  Flexion (PT holding upper trap to decrease scapular elevation last few reps) Horizontal ab/adduction with pulley scap squeeze  Standing  Biceps stretch at table 30 sec x 3  Corner stretch low, mid, high range position 10-20 sec x 2  Doorway stretch 30 sec low middle high x 2  Chest lift/scap squeeze  Shoulder horizontal abduction stretch at wall 20 sec x 3  Supine  Hands behind head for stretch into ER  Neuromuscular re-education: (HEP)  Patient working in standing - improve smooth control and  improve muscular activation with decreased use of L upper trap with active assistive shoulder elevation. Working to facilitate posterior shoulder girdle musculature   Manual Therapy: Soft tissue mobilization and trigger point work through L cervical musculature and shoulder girdle including  upper trap and leveator; biceps; periscapular musculature; pecs PROM and stretching shoulder flexion; scaption; extension; ER  Scapular mobilization; glenohumeral joint mobs in PA and inferior planes    Treatment                                          DATE: 06/16/2023 Therapeutic Exercise: Pulley  Flexion (PT holding upper trap to decrease scapular elevation last few reps) Horizontal ab/adduction with pulley scap squeeze  Standing  Biceps stretch at table 30 sec x 3  Corner stretch low, mid, high range position 10-20 sec x 2  Doorway stretch 30 sec low middle high x 2  Chest lift/scap squeeze  Shoulder horizontal abduction stretch at wall 20 sec x 3   Sidelying  ER 2# DB x 10 x 2  Supine  Hands behind head for stretch into ER  Neuromuscular re-education: (HEP)  Patient working in standing - improve smooth control and improve muscular activation with decreased use of L upper trap with active assistive shoulder elevation. Working to facilitate posterior shoulder girdle musculature   Manual Therapy: Soft tissue mobilization and trigger point work through L cervical musculature and shoulder girdle including  upper trap and leveator; biceps; periscapular musculature; pecs PROM and stretching shoulder flexion; scaption; extension; ER  Scapular mobilization; joint mobs in PA and inferior planes   Treatment                                          DATE: 06/14/2023 Therapeutic Exercise: Pulley  Flexion (PT holding upper trap to decrease scapular elevation last few reps) Horizontal ab/adduction with pulley scap squeeze  Standing  Biceps stretch at table 30 sec x 3  Chest lift/scap squeeze   Shoulder horizontal abduction stretch at wall 20 sec x 3  Corner stretch low, mid, high range position 10-20 sec x 2  Supine  T position for stretch through pecs and UE 10-20 sec x 3 Trunk rotation for stretch for pecs 20-30 sec x 4 Hands behind head for stretch into ER  Neuromuscular re-education: (HEP)  Patient working in sitting - improve smooth control and improve muscular activation with decreased use of L upper trap with active assistive shoulder elevation. Working to facilitate posterior shoulder girdle musculature   Manual Therapy: Soft tissue mobilization and trigger point work through L cervical musculature and shoulder girdle including  upper trap and leveator; biceps; periscapular musculature; pecs PROM and stretching shoulder flexion; scaption; extension; ER  Scapular mobilization  [facility] Adult PT Treatment:                                                DATE: 06/08/2023 Therapeutic Exercise: Pulleys: flexion, scaption, horiz abd/add Self-massage with soft spiky ball --> L UT, rhomboids, mid traps Standing bicep stretch at table --> added lateral head tilt Sitting on palm --> UT stretch x30" Modified chest expansion (palms on wall, standing forward over feet) Supine elbow lifts 2#DB --> ribcage stabilization --> elbow circles Manual Therapy: Seated STM/TPR (L) UT/LS --> pin & stretch UT Modalities: Moist heat shoulder/neck x 10 min    PATIENT EDUCATION: Education details: POC; HEP Person educated: Patient Education method: Programmer, multimedia, Demonstration, Tactile cues, Verbal cues, and Handouts Education comprehension: verbalized understanding, returned demonstration, verbal cues required, tactile cues required, and needs further education  HOME EXERCISE PROGRAM: (from treatment prior to surgery 03/14/23)  E-mailed to: [email or web address]   Access Code: [email or web address] URL: [email or web address] Date: 06/21/2023 Prepared by: Corlis Leak  Exercises - Seated  Shoulder Flexion AAROM with Pulley Behind  - 2 x daily - 7 x weekly - 1 sets - 10 reps - 10 sec  hold - Seated Shoulder Scaption AAROM with Pulley at Side  - 2 x daily -  7 x weekly - 1 sets - 10 reps - 10sec  hold - Seated Cervical Retraction  - 2 x daily - 7 x weekly - 1-2 sets - 5-10 reps - 10 sec  hold - Seated Scapular Retraction  - 2 x daily - 7 x weekly - 1-2 sets - 10 reps - 10 sec  hold - Standing Scapular Retraction  - 3 x daily - 7 x weekly - 1 sets - 10 reps - 10 sec  hold - Shoulder External Rotation and Scapular Retraction  - 3 x daily - 7 x weekly - 1 sets - 10 reps - 3-5 sec   hold - Seated Shoulder W  - 2 x daily - 7 x weekly - 1 sets - 10 reps - 3 sec  hold - Shoulder External Rotation and Scapular Retraction with Resistance  - 2 x daily - 7 x weekly - 1 sets - 10 reps - 3-5 sec  hold - Shoulder W - External Rotation with Resistance  - 2 x daily - 7 x weekly - 1-2 sets - 10 reps - 3 sec  hold - Standing Bilateral Low Shoulder Row with Anchored Resistance  - 2 x daily - 7 x weekly - 1-3 sets - 10 reps - 2-3 sec  hold - Drawing Bow  - 1 x daily - 7 x weekly - 1 sets - 10 reps - 3 sec  hold - Prone Scapular Retraction  - 2 x daily - 7 x weekly - 1 sets - 5-10 reps - 3-5 sec  hold - Cat Cow  - 2 x daily - 7 x weekly - 1 sets - 5-10 reps - 3-5 sec  hold - Cat Cow to Child's Pose  - 2 x daily - 7 x weekly - 1 sets - 3 reps - 20-30 sec  hold - Shoulder Flexion Serratus Activation with Resistance  - 1 x daily - 7 x weekly - 1 sets - 10 reps - 3-5 sec  hold - Wall Clock with Theraband  - 1 x daily - 7 x weekly - 1 sets - 10 reps - 2-3 sec  hold  ASSESSMENT:  CLINICAL IMPRESSION: Patient is progressing well with shoulder rehab. Added strengthening exercises and continues to work on pec stretching. She continues to have some joint tightness and shoulder weakness. Good gains in ER with joint mobs and PROM. Continued improvement in shoulder mobility/ROM with manual work and PROM/stretching.  Will decrease treatment to 1x/wk and continue focus on ROM and strength.              04/21/23: ESI went well 04/20/23 and she can see improvement in the L UE symptoms. Manual work decreases muscular tightness and patient does tolerated PROM/stretching better. She continues to have tight but empty end feel. Note some improving muscular tightness through L shoulder girdle with persistent tightness in [facility] joint with decreased mobility and stability in the L scapulothoracic range. Patient is working on her exercises and ROM at home.   OBJECTIVE IMPAIRMENTS: Lt shoulder pain which started with lifting overhead her luggage when travelling. She has had gradually increasing pain and stiffness in the Lt shoulder. She received an injection ~ 1-2 weeks ago with some improvement. Patient presents with poor posture and alignment; limited A and PROM Lt shoulder; functional weakness due to pain; decreased functional activities and pain on a daily basis    GOALS: Goals reviewed with patient? Yes  SHORT TERM GOALS: Target date: 04/26/2023  Independent in initial HEP  Baseline: Goal status: met  2.  Increase AROM shoulder flexion and abduction to 100-110 degrees  Baseline:  Goal status: met   LONG TERM GOALS: Target date: 08/16/2023  Decrease pain in the Lt shoulder by 75-100% allowing patient to return to normal functional activities  Baseline: pain ~ 60% improved Goal status: no going   2.  AROM Lt shoulder WFL's and pain free  Baseline:  Goal status: on going  3.  Patient reports return to normal functional activities including lifting grandson with minimal to no increase in Lt shoulder pain  Baseline:  Goal status: on going   4.  Independent in HEP  Baseline:  Goal status: on going   PLAN:  PT FREQUENCY: 1-2x/week  PT DURATION: 10 weeks  PLANNED INTERVENTIONS: Therapeutic exercises, Therapeutic activity, Neuromuscular re-education, Patient/Family education, Self Care, Joint mobilization,  Aquatic Therapy, Dry Needling, Electrical stimulation, Spinal mobilization, Cryotherapy, Moist heat, Taping, Vasopneumatic device, Ultrasound, Ionotophoresis 4mg /ml Dexamethasone, Manual therapy, and Re-evaluation  PLAN FOR NEXT SESSION: review and progress exercises; continue with postural correction and education; manual work, DN, modalities as indicated    [employer], PT 06/21/2023, 1:03 PM

## 2023-06-22 ENCOUNTER — Other Ambulatory Visit: Payer: Self-pay | Admitting: Obstetrics and Gynecology

## 2023-06-22 DIAGNOSIS — Z1231 Encounter for screening mammogram for malignant neoplasm of breast: Secondary | ICD-10-CM

## 2023-06-28 ENCOUNTER — Encounter: Payer: Self-pay | Admitting: Rehabilitative and Restorative Service Providers"

## 2023-06-28 ENCOUNTER — Ambulatory Visit: Payer: BC Managed Care – PPO | Admitting: Rehabilitative and Restorative Service Providers"

## 2023-06-28 DIAGNOSIS — M25511 Pain in right shoulder: Secondary | ICD-10-CM | POA: Diagnosis not present

## 2023-06-28 DIAGNOSIS — M25512 Pain in left shoulder: Secondary | ICD-10-CM | POA: Diagnosis not present

## 2023-06-28 DIAGNOSIS — G8929 Other chronic pain: Secondary | ICD-10-CM | POA: Diagnosis not present

## 2023-06-28 DIAGNOSIS — M6281 Muscle weakness (generalized): Secondary | ICD-10-CM

## 2023-06-28 DIAGNOSIS — R29898 Other symptoms and signs involving the musculoskeletal system: Secondary | ICD-10-CM | POA: Diagnosis not present

## 2023-06-28 NOTE — Therapy (Signed)
OUTPATIENT PHYSICAL THERAPY SHOULDER TREATMENT   Patient Name: Lori King MRN: [id number] DOB:1966-06-06, 57 y.o., female Today's Date: 06/28/2023  END OF SESSION:  PT End of Session - 06/28/23 1233     Visit Number 29    Number of Visits 34    Date for PT Re-Evaluation 08/16/23    Authorization Type [employer] [id number]    PT Start Time 1145    PT Stop Time 1240    PT Time Calculation (min) 55 min    Activity Tolerance Patient tolerated treatment well             Past Medical History:  Diagnosis Date   Adhesive capsulitis of right shoulder    Allergic rhinitis, seasonal    Anemia    Anxiety    MDD (major depressive disorder)    Wears contact lenses    Past Surgical History:  Procedure Laterality Date   BREAST BIOPSY Left 2010   x2 per pt benign   CESAREAN SECTION  X2  last one 02-28-2001   W/  BILATERAL TUBAL LIGATION w/ last c/s   COLONOSCOPY  11/01/2017   HYSTEROSCOPY W/ ENDOMETRIAL ABLATION  2019   LUMBAR LAMINECTOMY/DECOMPRESSION MICRODISCECTOMY  2013   L4  --- S1   SHOULDER ARTHROSCOPY Right 08/28/2020   Procedure: ARTHROSCOPY SHOULDER lysis of adhesions and manipulation under anesthesia, extensive debridement;  Surgeon: Yolonda Kida, MD;  Location: [facility] [place];  Service: Orthopedics;  Laterality: Right;   There are no problems to display for this patient.   PCP: Dr Angelica Chessman  REFERRING PROVIDER: Dr Arlyce Harman  REFERRING DIAG: pain of Lt shoulder  THERAPY DIAG:  Acute pain of left shoulder  Muscle weakness (generalized)  Other symptoms and signs involving the musculoskeletal system  Chronic right shoulder pain  Rationale for Evaluation and Treatment: Rehabilitation  ONSET DATE: 10/24/22  SUBJECTIVE:                                                                                                                                                                                      SUBJECTIVE  STATEMENT: Patient reports that her shoulder is doing better. She has stopped the tramadol and is pleased with her progress. She is sore through the neck area from boating over the weekend. Working on exercises especially doorway stretches. Feels that he strength is coming along nicely. Plans to return to water exercises tomorrow. Sees MD next week.   03/15/23 RE-EVAL: Patient underwent L shoulder surgery with extensive debridement; labral repair; biceps tendon release; removal of bone spurs 03/14/23.  She has minimal pain due to remaining effects of spinal block for surgery. Bulky  dressing limits movement and the effect of ice. Remains in the sling.   Cervical MRI shows degenerative changes C5/6. MD will send order for cervical spine.   Hand dominance: Right  PERTINENT HISTORY: Rt shoulder pain 3 yrs with shoulder surgery for labrum tear and biceps tendon; adhesive capsulitis; reactive hypoglycemic disease; fatty liver  PAIN:  Are you having pain? Yes: NPRS scale: 0-1/10 shoulder/biceps Pain location: R shoulder/arm/shoulder blade area  Pain description: sharp; aching; stabbing pain in biceps radiating to elbow with big reaching to the side  Aggravating factors: reaching overhead to the side and behind  Relieving factors: ice; TENs unit   PRECAUTIONS: None  WEIGHT BEARING RESTRICTIONS: No  FALLS:  Has patient fallen in last 6 months? No   PATIENT GOALS:get rid of the shoulder pain and increase ROM   NEXT MD VISIT: 06/09/23  OBJECTIVE:   DIAGNOSTIC FINDINGS:  None available for shoulder; x-ray (-)   02/13/23 MRI Lt shoulder -  some joint space narrowing, subchondral sclerosis, subchondral cyst formation and osteophyte formation in the glenohumeral joint, indicative of osteoarthritis.  POSTURE: Patient presents with head forward posture with increased thoracic kyphosis; shoulders rounded and elevated; scapulae abducted and rotated along the thoracic spine; head of the humerus  anterior in orientation.  CERVICAL ROM: 02/02/23 Flexion 43 tight pain Lt upper trap  Extension 48 tight ant/post neck R lateral flexion 36 tight L cervical  L lateral flexion 42 R rotation 71 L rotation 62 tight pain Lt cervical to upper trap     CERVICAL ROM: 06/07/23 Flexion 48 tight pain Lt upper trap  Extension 51 tight post neck R lateral flexion 34 tight L cervical  L lateral flexion 34 tight  R rotation 64 tight  L rotation 62 tight   UPPER EXTREMITY ROM:   Active ROM Right eval Left eval Left  01/11/23 Left  02/02/23 Left(passive) 03/15/23 Left passive 03/29/23  AAROM  04/04/23 AAROM 05/05/23 AAROM  06/07/23  Shoulder flexion 160 112 130 107 pain  80 125 138 148 150  Shoulder extension 59 25 48 31 pain  NT NT   57  Shoulder abduction (scaption) 168 112 133 92 pain  NT 84 92 128 145  Shoulder adduction           Shoulder internal rotation T7 Thumb to waist Thumb  T12 Hand lateral hip pain  NT neutral   Hand to waist   Shoulder external rotation (in scapular plane) 95 85 93 45 pain  NT 70 75 75 70  Elbow flexion     120 WNL     Elbow extension     0 WFL     Wrist flexion           Wrist extension           Wrist ulnar deviation           Wrist radial deviation           Wrist pronation           Wrist supination           (Blank rows = not tested) PASSIVE ROM - supine flexion 152; scaption 147; ER 90   AAROM/PROM supine - 04/19/23: PROM ~ 140 deg flexion; 92 deg abduction in scapular plane; 70 deg ER in scapular plane    UPPER EXTREMITY MMT: strength not assessed - moves Lt UE well against gravity; limited by pain  MMT Right eval Left eval  Shoulder flexion  Shoulder extension    Shoulder abduction    Shoulder adduction    Shoulder internal rotation    Shoulder external rotation    Middle trapezius    Lower trapezius    Elbow flexion    Elbow extension    Wrist flexion    Wrist extension    Wrist ulnar deviation    Wrist radial deviation    Wrist  pronation    Wrist supination    Grip strength (lbs)    (Blank rows = not tested) 05/17/23:  Grip - R 37; L 18 Lateral pinch R 4.5; L 1.5 Three jaw: R 3.5; L .5     05/26/23:  Grip - R 37; L 26 Lateral pinch R 4.5; L 2 Three jaw: R 3.5; L .6     06/07/23:  Grip - R 37; L 33 Lateral pinch R 4.5; L 4 Three jaw: R 3.5; L 3.5     JOINT MOBILITY TESTING:  Significant joint tightness Lt shoulder in all planes  02/02/23: Significant joint tightness Lt shoulder in all planes 03/15/23: NT  UPPER LIMB TENSION TEST:  Positive L with patient unable to achieve 90 deg shoulder abduction for testing. UE in ~ 60 deg abduction with elbow extension produced radicular symptoms into Lt UE   PALPATION:  Muscular tightness Lt shoulder through the upper trap; pecs; leveator; teres 02/02/23: muscular tightness Lt shoulder girdle in upper trap; pecs; leveator; rhomboids; teres 02/09/23: muscular tightness Lt cervical and upper trap area through the shoulder girdle including pecs; leveator; rhomboids; teres 02/15/23: persistent tightness L cervical and upper trap area  03/15/23: muscular tightness L shoulder girdle  06/07/23: persistent muscular tightness L cervical area into upper trap and biceps   04/25/23 - Observations:  Patient has poor movement quality with over activation of L upper trap with all active movement of L UE  06/07/23: good improvement in quality of movement L UE with increased activation and control of posterior shoulders girdle  Treatment                                          DATE: 06/28/2023 Therapeutic Exercise: Pulley  Flexion (PT holding upper trap to decrease scapular elevation last few reps) Horizontal ab/adduction with pulley scap squeeze  Standing  Biceps stretch at table 30 sec x 3  Corner stretch low, mid, high range position 10-20 sec x 2  Doorway stretch 30 sec low middle high x 2  Chest lift/scap squeeze  Shoulder horizontal abduction stretch at wall 20 sec x 3   Supine  Hands behind head for stretch into ER  Neuromuscular re-education: (HEP)  Patient working in standing - improve smooth control and improve muscular activation with decreased use of L upper trap with active assistive shoulder elevation. Working to facilitate posterior shoulder girdle musculature   Manual Therapy: Soft tissue mobilization and trigger point work through L cervical musculature and shoulder girdle including upper trap and leveator; biceps; periscapular musculature; pecs - patient prone and supine  PROM and stretching shoulder flexion; ER  - cervical ROM patient in supine Scapular mobilization; glenohumeral joint mobs in PA and inferior planes    Treatment  DATE: 06/21/2023 Therapeutic Exercise: Pulley  Flexion (PT holding upper trap to decrease scapular elevation last few reps) Horizontal ab/adduction with pulley scap squeeze  Standing  Biceps stretch at table 30 sec x 3  Corner stretch low, mid, high range position 10-20 sec x 2  Doorway stretch 30 sec low middle high x 2  Chest lift/scap squeeze  Shoulder horizontal abduction stretch at wall 20 sec x 3  Supine  Hands behind head for stretch into ER  Neuromuscular re-education: (HEP)  Patient working in standing - improve smooth control and improve muscular activation with decreased use of L upper trap with active assistive shoulder elevation. Working to facilitate posterior shoulder girdle musculature   Manual Therapy: Soft tissue mobilization and trigger point work through L cervical musculature and shoulder girdle including  upper trap and leveator; biceps; periscapular musculature; pecs PROM and stretching shoulder flexion; scaption; extension; ER  Scapular mobilization; glenohumeral joint mobs in PA and inferior planes    Treatment                                          DATE: 06/16/2023 Therapeutic Exercise: Pulley  Flexion (PT holding upper trap to decrease  scapular elevation last few reps) Horizontal ab/adduction with pulley scap squeeze  Standing  Biceps stretch at table 30 sec x 3  Corner stretch low, mid, high range position 10-20 sec x 2  Doorway stretch 30 sec low middle high x 2  Chest lift/scap squeeze  Shoulder horizontal abduction stretch at wall 20 sec x 3   Sidelying  ER 2# DB x 10 x 2  Supine  Hands behind head for stretch into ER  Neuromuscular re-education: (HEP)  Patient working in standing - improve smooth control and improve muscular activation with decreased use of L upper trap with active assistive shoulder elevation. Working to facilitate posterior shoulder girdle musculature   Manual Therapy: Soft tissue mobilization and trigger point work through L cervical musculature and shoulder girdle including  upper trap and leveator; biceps; periscapular musculature; pecs PROM and stretching shoulder flexion; scaption; extension; ER  Scapular mobilization; joint mobs in PA and inferior planes   Treatment                                          DATE: 06/14/2023 Therapeutic Exercise: Pulley  Flexion (PT holding upper trap to decrease scapular elevation last few reps) Horizontal ab/adduction with pulley scap squeeze  Standing  Biceps stretch at table 30 sec x 3  Chest lift/scap squeeze  Shoulder horizontal abduction stretch at wall 20 sec x 3  Corner stretch low, mid, high range position 10-20 sec x 2  Supine  T position for stretch through pecs and UE 10-20 sec x 3 Trunk rotation for stretch for pecs 20-30 sec x 4 Hands behind head for stretch into ER  Neuromuscular re-education: (HEP)  Patient working in sitting - improve smooth control and improve muscular activation with decreased use of L upper trap with active assistive shoulder elevation. Working to facilitate posterior shoulder girdle musculature   Manual Therapy: Soft tissue mobilization and trigger point work through L cervical musculature and shoulder girdle  including  upper trap and leveator; biceps; periscapular musculature; pecs PROM and stretching shoulder flexion; scaption; extension; ER  Scapular mobilization  [facility] Adult PT Treatment:                                                DATE: 06/08/2023 Therapeutic Exercise: Pulleys: flexion, scaption, horiz abd/add Self-massage with soft spiky ball --> L UT, rhomboids, mid traps Standing bicep stretch at table --> added lateral head tilt Sitting on palm --> UT stretch x30" Modified chest expansion (palms on wall, standing forward over feet) Supine elbow lifts 2#DB --> ribcage stabilization --> elbow circles Manual Therapy: Seated STM/TPR (L) UT/LS --> pin & stretch UT Modalities: Moist heat shoulder/neck x 10 min    PATIENT EDUCATION: Education details: POC; HEP Person educated: Patient Education method: Programmer, multimedia, Demonstration, Tactile cues, Verbal cues, and Handouts Education comprehension: verbalized understanding, returned demonstration, verbal cues required, tactile cues required, and needs further education  HOME EXERCISE PROGRAM: (from treatment prior to surgery 03/14/23)  E-mailed to: [email or web address]   Access Code: [email or web address] URL: [email or web address] Date: 06/21/2023 Prepared by: Corlis Leak  Exercises - Seated Shoulder Flexion AAROM with Pulley Behind  - 2 x daily - 7 x weekly - 1 sets - 10 reps - 10 sec  hold - Seated Shoulder Scaption AAROM with Pulley at Side  - 2 x daily - 7 x weekly - 1 sets - 10 reps - 10sec  hold - Seated Cervical Retraction  - 2 x daily - 7 x weekly - 1-2 sets - 5-10 reps - 10 sec  hold - Seated Scapular Retraction  - 2 x daily - 7 x weekly - 1-2 sets - 10 reps - 10 sec  hold - Standing Scapular Retraction  - 3 x daily - 7 x weekly - 1 sets - 10 reps - 10 sec  hold - Shoulder External Rotation and Scapular Retraction  - 3 x daily - 7 x weekly - 1 sets - 10 reps - 3-5 sec   hold - Seated Shoulder W  - 2 x daily - 7 x weekly - 1  sets - 10 reps - 3 sec  hold - Shoulder External Rotation and Scapular Retraction with Resistance  - 2 x daily - 7 x weekly - 1 sets - 10 reps - 3-5 sec  hold - Shoulder W - External Rotation with Resistance  - 2 x daily - 7 x weekly - 1-2 sets - 10 reps - 3 sec  hold - Standing Bilateral Low Shoulder Row with Anchored Resistance  - 2 x daily - 7 x weekly - 1-3 sets - 10 reps - 2-3 sec  hold - Drawing Bow  - 1 x daily - 7 x weekly - 1 sets - 10 reps - 3 sec  hold - Prone Scapular Retraction  - 2 x daily - 7 x weekly - 1 sets - 5-10 reps - 3-5 sec  hold - Cat Cow  - 2 x daily - 7 x weekly - 1 sets - 5-10 reps - 3-5 sec  hold - Cat Cow to Child's Pose  - 2 x daily - 7 x weekly - 1 sets - 3 reps - 20-30 sec  hold - Shoulder Flexion Serratus Activation with Resistance  - 1 x daily - 7 x weekly - 1 sets - 10 reps - 3-5 sec  hold - Wall Clock with Theraband  - 1 x daily - 7 x weekly - 1 sets -  10 reps - 2-3 sec  hold  ASSESSMENT:  CLINICAL IMPRESSION: Patient is progressing well with shoulder rehab. More focus today on STM and PROM for cervical musculature due to increased tightness in cervical spine. She continues to have some joint tightness and shoulder weakness. Good gains in ER with joint mobs and PROM. Continued improvement in shoulder mobility/ROM with manual work and PROM/stretching. Will decrease treatment to 1x/wk and continue focus on ROM and strength. Note to MD at next visit.              04/21/23: ESI went well 04/20/23 and she can see improvement in the L UE symptoms. Manual work decreases muscular tightness and patient does tolerated PROM/stretching better. She continues to have tight but empty end feel. Note some improving muscular tightness through L shoulder girdle with persistent tightness in [facility] joint with decreased mobility and stability in the L scapulothoracic range. Patient is working on her exercises and ROM at home.   OBJECTIVE IMPAIRMENTS: Lt shoulder pain which started with  lifting overhead her luggage when travelling. She has had gradually increasing pain and stiffness in the Lt shoulder. She received an injection ~ 1-2 weeks ago with some improvement. Patient presents with poor posture and alignment; limited A and PROM Lt shoulder; functional weakness due to pain; decreased functional activities and pain on a daily basis    GOALS: Goals reviewed with patient? Yes  SHORT TERM GOALS: Target date: 04/26/2023  Independent in initial HEP  Baseline: Goal status: met  2.  Increase AROM shoulder flexion and abduction to 100-110 degrees  Baseline:  Goal status: met   LONG TERM GOALS: Target date: 08/16/2023  Decrease pain in the Lt shoulder by 75-100% allowing patient to return to normal functional activities  Baseline: pain ~ 60% improved Goal status: no going   2.  AROM Lt shoulder WFL's and pain free  Baseline:  Goal status: on going  3.  Patient reports return to normal functional activities including lifting grandson with minimal to no increase in Lt shoulder pain  Baseline:  Goal status: on going   4.  Independent in HEP  Baseline:  Goal status: on going   PLAN:  PT FREQUENCY: 1-2x/week  PT DURATION: 10 weeks  PLANNED INTERVENTIONS: Therapeutic exercises, Therapeutic activity, Neuromuscular re-education, Patient/Family education, Self Care, Joint mobilization, Aquatic Therapy, Dry Needling, Electrical stimulation, Spinal mobilization, Cryotherapy, Moist heat, Taping, Vasopneumatic device, Ultrasound, Ionotophoresis 4mg /ml Dexamethasone, Manual therapy, and Re-evaluation  PLAN FOR NEXT SESSION: review and progress exercises; continue with postural correction and education; manual work, DN, modalities as indicated    [employer], PT 06/28/2023, 12:39 PM

## 2023-07-05 ENCOUNTER — Ambulatory Visit
Payer: BC Managed Care – PPO | Attending: Orthopedic Surgery | Admitting: Rehabilitative and Restorative Service Providers"

## 2023-07-05 ENCOUNTER — Encounter: Payer: Self-pay | Admitting: Rehabilitative and Restorative Service Providers"

## 2023-07-05 DIAGNOSIS — M25511 Pain in right shoulder: Secondary | ICD-10-CM | POA: Insufficient documentation

## 2023-07-05 DIAGNOSIS — M25512 Pain in left shoulder: Secondary | ICD-10-CM | POA: Insufficient documentation

## 2023-07-05 DIAGNOSIS — M6281 Muscle weakness (generalized): Secondary | ICD-10-CM | POA: Diagnosis not present

## 2023-07-05 DIAGNOSIS — G8929 Other chronic pain: Secondary | ICD-10-CM | POA: Insufficient documentation

## 2023-07-05 DIAGNOSIS — R29898 Other symptoms and signs involving the musculoskeletal system: Secondary | ICD-10-CM | POA: Insufficient documentation

## 2023-07-05 NOTE — Therapy (Addendum)
OUTPATIENT PHYSICAL THERAPY SHOULDER TREATMENT   Patient Name: Lori King MRN: [id number] DOB:[birth year], 57 y.o., female Today's Date: 07/05/2023  END OF SESSION:  PT End of Session - 07/05/23 1019     Visit Number 30    Number of Visits 34    Date for PT Re-Evaluation 08/16/23    Authorization Type [employer] [id number]    PT Start Time 1017    PT Stop Time 1109    PT Time Calculation (min) 52 min    Activity Tolerance Patient tolerated treatment well             Past Medical History:  Diagnosis Date   Adhesive capsulitis of right shoulder    Allergic rhinitis, seasonal    Anemia    Anxiety    MDD (major depressive disorder)    Wears contact lenses    Past Surgical History:  Procedure Laterality Date   BREAST BIOPSY Left 2010   x2 per pt benign   CESAREAN SECTION  X2  last one 02-28-2001   W/  BILATERAL TUBAL LIGATION w/ last c/s   COLONOSCOPY  11/01/2017   HYSTEROSCOPY W/ ENDOMETRIAL ABLATION  2019   LUMBAR LAMINECTOMY/DECOMPRESSION MICRODISCECTOMY  2013   L4  --- S1   SHOULDER ARTHROSCOPY Right 08/28/2020   Procedure: ARTHROSCOPY SHOULDER lysis of adhesions and manipulation under anesthesia, extensive debridement;  Surgeon: Yolonda Kida, MD;  Location: [facility];  Service: Orthopedics;  Laterality: Right;   There are no problems to display for this patient.   PCP: Dr Angelica Chessman  REFERRING PROVIDER: Dr Arlyce Harman  REFERRING DIAG: pain of Lt shoulder  THERAPY DIAG:  Acute pain of left shoulder  Muscle weakness (generalized)  Other symptoms and signs involving the musculoskeletal system  Chronic right shoulder pain  Rationale for Evaluation and Treatment: Rehabilitation  ONSET DATE: 10/24/22  SUBJECTIVE:                                                                                                                                                                                      SUBJECTIVE  STATEMENT: Patient reports continued improvement in the L shoulder. She slept through the night for the entire night for the first time since March. Decreasing meds and can tell she is making progress. She is having no pain in the shoulder and only tightness in the neck. Working on exercises especially doorway stretches. Feels that he strength is coming along nicely. She is doing some exercises in the water about twice a week and that feels good. Sees MD Thursday.   03/15/23 RE-EVAL: Patient underwent L shoulder surgery with extensive debridement; labral repair;  biceps tendon release; removal of bone spurs 03/14/23.  She has minimal pain due to remaining effects of spinal block for surgery. Bulky dressing limits movement and the effect of ice. Remains in the sling.   Cervical MRI shows degenerative changes C5/6. MD will send order for cervical spine.   Hand dominance: Right  PERTINENT HISTORY: Rt shoulder pain 3 yrs with shoulder surgery for labrum tear and biceps tendon; adhesive capsulitis; reactive hypoglycemic disease; fatty liver  PAIN:  Are you having pain? Yes: NPRS scale: 0-1/10 shoulder/biceps Pain location: R shoulder/arm/shoulder blade area  Pain description: sharp; aching; stabbing pain in biceps radiating to elbow with big reaching to the side  Aggravating factors: reaching overhead to the side and behind  Relieving factors: ice; TENs unit   PRECAUTIONS: None  WEIGHT BEARING RESTRICTIONS: No  FALLS:  Has patient fallen in last 6 months? No   PATIENT GOALS:get rid of the shoulder pain and increase ROM   NEXT MD VISIT: 06/09/23  OBJECTIVE:   DIAGNOSTIC FINDINGS:  None available for shoulder; x-ray (-)   02/13/23 MRI Lt shoulder -  some joint space narrowing, subchondral sclerosis, subchondral cyst formation and osteophyte formation in the glenohumeral joint, indicative of osteoarthritis.  POSTURE: Patient presents with head forward posture with increased thoracic  kyphosis; shoulders rounded and elevated; scapulae abducted and rotated along the thoracic spine; head of the humerus anterior in orientation.  CERVICAL ROM: 02/02/23 Flexion 43 tight pain Lt upper trap  Extension 48 tight ant/post neck R lateral flexion 36 tight L cervical  L lateral flexion 42 R rotation 71 L rotation 62 tight pain Lt cervical to upper trap     CERVICAL ROM: 06/07/23 Flexion 48 tight pain Lt upper trap  Extension 51 tight post neck R lateral flexion 34 tight L cervical  L lateral flexion 34 tight  R rotation 64 tight  L rotation 62 tight   UPPER EXTREMITY ROM:   Active ROM Right eval Left eval Left  01/11/23 Left  02/02/23 Left(passive) 03/15/23 Left passive 03/29/23  AAROM  04/04/23 AAROM  06/07/23 AROM  Standing  07/04/23 PROM Supine  07/04/23  Shoulder flexion 160 112 130 107 pain  80 125 138 150 146 158  Shoulder extension 59 25 48 31 pain  NT NT  57 50 60  Shoulder abduction (scaption) 168 112 133 92 pain  NT 84 92 145 140 151  Shoulder adduction            Shoulder internal rotation T7 Thumb to waist Thumb  T12 Hand lateral hip pain  NT neutral  Hand to waist  Thumb L1   Shoulder external rotation (in scapular plane) 95 85 93 45 pain  NT 70 75 70 67 75  Elbow flexion     120 WNL      Elbow extension     0 WFL      Wrist flexion            Wrist extension            Wrist ulnar deviation            Wrist radial deviation            Wrist pronation            Wrist supination            (Blank rows = not tested) PASSIVE ROM - supine flexion 152; scaption 147; ER 90   AAROM/PROM supine - 04/19/23: PROM ~  140 deg flexion; 92 deg abduction in scapular plane; 70 deg ER in scapular plane    UPPER EXTREMITY MMT: initial evaluation - strength not assessed - moves Lt UE well against gravity; limited by pain  MMT Right 07/04/23 Left 07/04/23  Shoulder flexion 5 5-  Shoulder extension 5 5  Shoulder abduction 5 4+  Shoulder adduction    Shoulder internal  rotation 5 5-  Shoulder external rotation 5 4-  Middle trapezius    Lower trapezius    Elbow flexion    Elbow extension    Wrist flexion    Wrist extension    Wrist ulnar deviation    Wrist radial deviation    Wrist pronation    Wrist supination    Grip strength (lbs)    (Blank rows = not tested) 05/17/23:  Grip - R 37; L 18 Lateral pinch R 4.5; L 1.5 Three jaw: R 3.5; L .5     05/26/23:  Grip - R 37; L 26 Lateral pinch R 4.5; L 2 Three jaw: R 3.5; L .6     06/07/23:  Grip - R 37; L 33 Lateral pinch R 4.5; L 4 Three jaw: R 3.5; L 3.5    06/07/23:  Grip - R 37; L 37 Lateral pinch R 5; L4.5 Three jaw: R 5; L 5    JOINT MOBILITY TESTING:  Significant joint tightness Lt shoulder in all planes  02/02/23: Significant joint tightness Lt shoulder in all planes 07/04/23: joint tightness end ranges L GH joint; significant improvement in scapular control with L shoulder movement   UPPER LIMB TENSION TEST:  Positive L with patient unable to achieve 90 deg shoulder abduction for testing. UE in ~ 60 deg abduction with elbow extension produced radicular symptoms into Lt UE   PALPATION:  Muscular tightness Lt shoulder through the upper trap; pecs; leveator; teres 02/02/23: muscular tightness Lt shoulder girdle in upper trap; pecs; leveator; rhomboids; teres 02/09/23: muscular tightness Lt cervical and upper trap area through the shoulder girdle including pecs; leveator; rhomboids; teres 02/15/23: persistent tightness L cervical and upper trap area  03/15/23: muscular tightness L shoulder girdle  06/07/23: persistent muscular tightness L cervical area into upper trap and biceps  07/04/23: continued tightness L cervical musculature into upper trap and biceps areas   Observations:  04/25/23 - Patient has poor movement quality with over activation of L upper trap with all active movement of L UE  06/07/23: good improvement in quality of movement L UE with increased activation and control of  posterior shoulders girdle  07/04/23 - significant improvement in movement quality with elevation of L UE; continued compensatory movement patterns noted with ER and elevation into ER position     Treatment                                          DATE: 07/05/2023 Therapeutic Exercise: Pulley  Flexion (PT holding upper trap to decrease scapular elevation last few reps) Horizontal ab/adduction with pulley scap squeeze  Standing  Biceps stretch at table 30 sec x 3  Corner stretch low, mid, high range position 10-20 sec x 2  Doorway stretch 30 sec low middle high x 2  Chest lift/scap squeeze  Shoulder horizontal abduction stretch at wall 20 sec x 3  Supine  Hands behind head for stretch into ER  Neuromuscular re-education: (HEP)  Patient working  in standing - improve smooth control and improve muscular activation with decreased use of L upper trap with active assistive shoulder elevation. Working to facilitate posterior shoulder girdle musculature   Manual Therapy: Soft tissue mobilization and trigger point work through L cervical musculature and shoulder girdle including upper trap and leveator; biceps; periscapular musculature; pecs - patient supine  PROM and stretching shoulder flexion; ER  - cervical ROM patient in supine Scapular mobilization; glenohumeral joint mobs in PA and inferior planes    Treatment                                          DATE: 06/28/2023 Therapeutic Exercise: Pulley  Flexion (PT holding upper trap to decrease scapular elevation last few reps) Horizontal ab/adduction with pulley scap squeeze  Standing  Biceps stretch at table 30 sec x 3  Corner stretch low, mid, high range position 10-20 sec x 2  Doorway stretch 30 sec low middle high x 2  Chest lift/scap squeeze  Shoulder horizontal abduction stretch at wall 20 sec x 3  Supine  Hands behind head for stretch into ER  Neuromuscular re-education: (HEP)  Patient working in standing - improve smooth  control and improve muscular activation with decreased use of L upper trap with active assistive shoulder elevation. Working to facilitate posterior shoulder girdle musculature   Manual Therapy: Soft tissue mobilization and trigger point work through L cervical musculature and shoulder girdle including upper trap and leveator; biceps; periscapular musculature; pecs - patient prone and supine  PROM and stretching shoulder flexion; ER  - cervical ROM patient in supine Scapular mobilization; glenohumeral joint mobs in PA and inferior planes    Treatment                                          DATE: 06/21/2023 Therapeutic Exercise: Pulley  Flexion (PT holding upper trap to decrease scapular elevation last few reps) Horizontal ab/adduction with pulley scap squeeze  Standing  Biceps stretch at table 30 sec x 3  Corner stretch low, mid, high range position 10-20 sec x 2  Doorway stretch 30 sec low middle high x 2  Chest lift/scap squeeze  Shoulder horizontal abduction stretch at wall 20 sec x 3  Supine  Hands behind head for stretch into ER  Neuromuscular re-education: (HEP)  Patient working in standing - improve smooth control and improve muscular activation with decreased use of L upper trap with active assistive shoulder elevation. Working to facilitate posterior shoulder girdle musculature   Manual Therapy: Soft tissue mobilization and trigger point work through L cervical musculature and shoulder girdle including  upper trap and leveator; biceps; periscapular musculature; pecs PROM and stretching shoulder flexion; scaption; extension; ER  Scapular mobilization; glenohumeral joint mobs in PA and inferior planes    Treatment                                          DATE: 06/16/2023 Therapeutic Exercise: Pulley  Flexion (PT holding upper trap to decrease scapular elevation last few reps) Horizontal ab/adduction with pulley scap squeeze  Standing  Biceps stretch at table 30 sec x 3   Corner stretch low, mid, high  range position 10-20 sec x 2  Doorway stretch 30 sec low middle high x 2  Chest lift/scap squeeze  Shoulder horizontal abduction stretch at wall 20 sec x 3   Sidelying  ER 2# DB x 10 x 2  Supine  Hands behind head for stretch into ER  Neuromuscular re-education: (HEP)  Patient working in standing - improve smooth control and improve muscular activation with decreased use of L upper trap with active assistive shoulder elevation. Working to facilitate posterior shoulder girdle musculature   Manual Therapy: Soft tissue mobilization and trigger point work through L cervical musculature and shoulder girdle including  upper trap and leveator; biceps; periscapular musculature; pecs PROM and stretching shoulder flexion; scaption; extension; ER  Scapular mobilization; joint mobs in PA and inferior planes   Treatment                                          DATE: 06/14/2023 Therapeutic Exercise: Pulley  Flexion (PT holding upper trap to decrease scapular elevation last few reps) Horizontal ab/adduction with pulley scap squeeze  Standing  Biceps stretch at table 30 sec x 3  Chest lift/scap squeeze  Shoulder horizontal abduction stretch at wall 20 sec x 3  Corner stretch low, mid, high range position 10-20 sec x 2  Supine  T position for stretch through pecs and UE 10-20 sec x 3 Trunk rotation for stretch for pecs 20-30 sec x 4 Hands behind head for stretch into ER  Neuromuscular re-education: (HEP)  Patient working in sitting - improve smooth control and improve muscular activation with decreased use of L upper trap with active assistive shoulder elevation. Working to facilitate posterior shoulder girdle musculature   Manual Therapy: Soft tissue mobilization and trigger point work through L cervical musculature and shoulder girdle including  upper trap and leveator; biceps; periscapular musculature; pecs PROM and stretching shoulder flexion; scaption;  extension; ER  Scapular mobilization  [facility] Adult PT Treatment:                                                DATE: 06/08/2023 Therapeutic Exercise: Pulleys: flexion, scaption, horiz abd/add Self-massage with soft spiky ball --> L UT, rhomboids, mid traps Standing bicep stretch at table --> added lateral head tilt Sitting on palm --> UT stretch x30" Modified chest expansion (palms on wall, standing forward over feet) Supine elbow lifts 2#DB --> ribcage stabilization --> elbow circles Manual Therapy: Seated STM/TPR (L) UT/LS --> pin & stretch UT Modalities: Moist heat shoulder/neck x 10 min    PATIENT EDUCATION: Education details: POC; HEP Person educated: Patient Education method: Programmer, multimedia, Demonstration, Tactile cues, Verbal cues, and Handouts Education comprehension: verbalized understanding, returned demonstration, verbal cues required, tactile cues required, and needs further education  HOME EXERCISE PROGRAM: (from treatment prior to surgery 03/14/23)  E-mailed to: [email or web address]   Access Code: [email or web address] URL: [email or web address] Date: 06/21/2023 Prepared by: Corlis Leak  Exercises - Seated Shoulder Flexion AAROM with Pulley Behind  - 2 x daily - 7 x weekly - 1 sets - 10 reps - 10 sec  hold - Seated Shoulder Scaption AAROM with Pulley at Side  - 2 x daily - 7 x weekly - 1 sets - 10 reps - 10sec  hold - Seated  Cervical Retraction  - 2 x daily - 7 x weekly - 1-2 sets - 5-10 reps - 10 sec  hold - Seated Scapular Retraction  - 2 x daily - 7 x weekly - 1-2 sets - 10 reps - 10 sec  hold - Standing Scapular Retraction  - 3 x daily - 7 x weekly - 1 sets - 10 reps - 10 sec  hold - Shoulder External Rotation and Scapular Retraction  - 3 x daily - 7 x weekly - 1 sets - 10 reps - 3-5 sec   hold - Seated Shoulder W  - 2 x daily - 7 x weekly - 1 sets - 10 reps - 3 sec  hold - Shoulder External Rotation and Scapular Retraction with Resistance  - 2 x daily - 7 x weekly -  1 sets - 10 reps - 3-5 sec  hold - Shoulder W - External Rotation with Resistance  - 2 x daily - 7 x weekly - 1-2 sets - 10 reps - 3 sec  hold - Standing Bilateral Low Shoulder Row with Anchored Resistance  - 2 x daily - 7 x weekly - 1-3 sets - 10 reps - 2-3 sec  hold - Drawing Bow  - 1 x daily - 7 x weekly - 1 sets - 10 reps - 3 sec  hold - Prone Scapular Retraction  - 2 x daily - 7 x weekly - 1 sets - 5-10 reps - 3-5 sec  hold - Cat Cow  - 2 x daily - 7 x weekly - 1 sets - 5-10 reps - 3-5 sec  hold - Cat Cow to Child's Pose  - 2 x daily - 7 x weekly - 1 sets - 3 reps - 20-30 sec  hold - Shoulder Flexion Serratus Activation with Resistance  - 1 x daily - 7 x weekly - 1 sets - 10 reps - 3-5 sec  hold - Wall Clock with Theraband  - 1 x daily - 7 x weekly - 1 sets - 10 reps - 2-3 sec  hold  ASSESSMENT:  CLINICAL IMPRESSION: Patient is progressing well with shoulder rehab. She demonstrates continued improvement in A/PROM; functional mobility and use of L UE. Strength is improved with weakness noted in L ER, abduction and to a lesser extent in flexion and IR. There is continued tightness in the L cervical and shoulder girdle musculature. She has decreased medications and reports improved sleep. Therapy is decreased to 1 time/week.  Note to MD.              04/21/23: ESI went well 04/20/23 and she can see improvement in the L UE symptoms. Manual work decreases muscular tightness and patient does tolerated PROM/stretching better. She continues to have tight but empty end feel. Note some improving muscular tightness through L shoulder girdle with persistent tightness in [facility] joint with decreased mobility and stability in the L scapulothoracic range. Patient is working on her exercises and ROM at home.   OBJECTIVE IMPAIRMENTS: Lt shoulder pain which started with lifting overhead her luggage when travelling. She has had gradually increasing pain and stiffness in the Lt shoulder. She received an injection ~ 1-2  weeks ago with some improvement. Patient presents with poor posture and alignment; limited A and PROM Lt shoulder; functional weakness due to pain; decreased functional activities and pain on a daily basis    GOALS: Goals reviewed with patient? Yes  SHORT TERM GOALS: Target date: 04/26/2023  Independent in  initial HEP  Baseline: Goal status: met  2.  Increase AROM shoulder flexion and abduction to 100-110 degrees  Baseline:  Goal status: met   LONG TERM GOALS: Target date: 08/16/2023  Decrease pain in the Lt shoulder by 75-100% allowing patient to return to normal functional activities  Baseline: pain ~ 60% improved Goal status: no going   2.  AROM Lt shoulder WFL's and pain free  Baseline:  Goal status: on going  3.  Patient reports return to normal functional activities including lifting grandson with minimal to no increase in Lt shoulder pain  Baseline:  Goal status: on going   4.  Independent in HEP  Baseline:  Goal status: on going   PLAN:  PT FREQUENCY: 1-2x/week  PT DURATION: 10 weeks  PLANNED INTERVENTIONS: Therapeutic exercises, Therapeutic activity, Neuromuscular re-education, Patient/Family education, Self Care, Joint mobilization, Aquatic Therapy, Dry Needling, Electrical stimulation, Spinal mobilization, Cryotherapy, Moist heat, Taping, Vasopneumatic device, Ultrasound, Ionotophoresis 4mg /ml Dexamethasone, Manual therapy, and Re-evaluation  PLAN FOR NEXT SESSION: review and progress exercises; continue with postural correction and education; manual work, DN, modalities as indicated    [employer], PT 07/05/2023, 10:20 AM

## 2023-07-07 DIAGNOSIS — M25512 Pain in left shoulder: Secondary | ICD-10-CM | POA: Diagnosis not present

## 2023-07-12 ENCOUNTER — Encounter: Payer: Self-pay | Admitting: Rehabilitative and Restorative Service Providers"

## 2023-07-12 ENCOUNTER — Ambulatory Visit: Payer: BC Managed Care – PPO | Admitting: Rehabilitative and Restorative Service Providers"

## 2023-07-12 DIAGNOSIS — R29898 Other symptoms and signs involving the musculoskeletal system: Secondary | ICD-10-CM | POA: Diagnosis not present

## 2023-07-12 DIAGNOSIS — M25512 Pain in left shoulder: Secondary | ICD-10-CM | POA: Diagnosis not present

## 2023-07-12 DIAGNOSIS — G8929 Other chronic pain: Secondary | ICD-10-CM

## 2023-07-12 DIAGNOSIS — M6281 Muscle weakness (generalized): Secondary | ICD-10-CM

## 2023-07-12 DIAGNOSIS — M25511 Pain in right shoulder: Secondary | ICD-10-CM | POA: Diagnosis not present

## 2023-07-12 NOTE — Therapy (Signed)
OUTPATIENT PHYSICAL THERAPY SHOULDER TREATMENT   Patient Name: Lori King MRN: [id number] DOB:1966-08-16, 57 y.o., female Today's Date: 07/12/2023  END OF SESSION:  PT End of Session - 07/12/23 1102     Visit Number 31    Number of Visits 34    Date for PT Re-Evaluation 08/16/23    Authorization Type [employer] [id number]    PT Start Time 1100    PT Stop Time 1155    PT Time Calculation (min) 55 min    Activity Tolerance Patient tolerated treatment well             Past Medical History:  Diagnosis Date   Adhesive capsulitis of right shoulder    Allergic rhinitis, seasonal    Anemia    Anxiety    MDD (major depressive disorder)    Wears contact lenses    Past Surgical History:  Procedure Laterality Date   BREAST BIOPSY Left 2010   x2 per pt benign   CESAREAN SECTION  X2  last one 02-28-2001   W/  BILATERAL TUBAL LIGATION w/ last c/s   COLONOSCOPY  11/01/2017   HYSTEROSCOPY W/ ENDOMETRIAL ABLATION  2019   LUMBAR LAMINECTOMY/DECOMPRESSION MICRODISCECTOMY  2013   L4  --- S1   SHOULDER ARTHROSCOPY Right 08/28/2020   Procedure: ARTHROSCOPY SHOULDER lysis of adhesions and manipulation under anesthesia, extensive debridement;  Surgeon: Yolonda Kida, MD;  Location: [facility];  Service: Orthopedics;  Laterality: Right;   There are no problems to display for this patient.   PCP: Dr Angelica Chessman  REFERRING PROVIDER: Dr Arlyce Harman  REFERRING DIAG: pain of Lt shoulder  THERAPY DIAG:  Acute pain of left shoulder  Muscle weakness (generalized)  Other symptoms and signs involving the musculoskeletal system  Chronic right shoulder pain  Rationale for Evaluation and Treatment: Rehabilitation  ONSET DATE: 10/24/22  SUBJECTIVE:                                                                                                                                                                                      SUBJECTIVE  STATEMENT: Patient reports that she saw the MD last week and he is pleased with progress. He wants her to have a saline injection in the L shoulder to help restore ER. MD also suggested possible follow up injection for cervical spine if symptoms persist. She is continuing to decrease meds and use L UE for more functional activities. Working on exercises especially doorway stretches. Feels that he strength is coming along nicely. She is doing some exercises in the water about twice a week and that feels good.   03/15/23 RE-EVAL: Patient  underwent L shoulder surgery with extensive debridement; labral repair; biceps tendon release; removal of bone spurs 03/14/23.  She has minimal pain due to remaining effects of spinal block for surgery. Bulky dressing limits movement and the effect of ice. Remains in the sling.   Cervical MRI shows degenerative changes C5/6. MD will send order for cervical spine.   Hand dominance: Right  PERTINENT HISTORY: Rt shoulder pain 3 yrs with shoulder surgery for labrum tear and biceps tendon; adhesive capsulitis; reactive hypoglycemic disease; fatty liver  PAIN:  Are you having pain? Yes: NPRS scale: 0-1/10 shoulder/biceps Pain location: R shoulder/arm/shoulder blade area  Pain description: sharp; aching; stabbing pain in biceps radiating to elbow with big reaching to the side  Aggravating factors: reaching overhead to the side and behind  Relieving factors: ice; TENs unit   PRECAUTIONS: None  WEIGHT BEARING RESTRICTIONS: No  FALLS:  Has patient fallen in last 6 months? No   PATIENT GOALS:get rid of the shoulder pain and increase ROM   NEXT MD VISIT: 06/09/23  OBJECTIVE:   DIAGNOSTIC FINDINGS:  None available for shoulder; x-ray (-)   02/13/23 MRI Lt shoulder -  some joint space narrowing, subchondral sclerosis, subchondral cyst formation and osteophyte formation in the glenohumeral joint, indicative of osteoarthritis.  POSTURE: Patient presents with head  forward posture with increased thoracic kyphosis; shoulders rounded and elevated; scapulae abducted and rotated along the thoracic spine; head of the humerus anterior in orientation.  CERVICAL ROM: 02/02/23 Flexion 43 tight pain Lt upper trap  Extension 48 tight ant/post neck R lateral flexion 36 tight L cervical  L lateral flexion 42 R rotation 71 L rotation 62 tight pain Lt cervical to upper trap     CERVICAL ROM: 06/07/23 Flexion 48 tight pain Lt upper trap  Extension 51 tight post neck R lateral flexion 34 tight L cervical  L lateral flexion 34 tight  R rotation 64 tight  L rotation 62 tight   UPPER EXTREMITY ROM:   Active ROM Right eval Left eval Left  01/11/23 Left  02/02/23 Left(passive) 03/15/23 Left passive 03/29/23  AAROM  04/04/23 AAROM  06/07/23 AROM  Standing  07/04/23 PROM Supine  07/04/23  Shoulder flexion 160 112 130 107 pain  80 125 138 150 146 158  Shoulder extension 59 25 48 31 pain  NT NT  57 50 60  Shoulder abduction (scaption) 168 112 133 92 pain  NT 84 92 145 140 151  Shoulder adduction            Shoulder internal rotation T7 Thumb to waist Thumb  T12 Hand lateral hip pain  NT neutral  Hand to waist  Thumb L1   Shoulder external rotation (in scapular plane) 95 85 93 45 pain  NT 70 75 70 67 75  Elbow flexion     120 WNL      Elbow extension     0 WFL      Wrist flexion            Wrist extension            Wrist ulnar deviation            Wrist radial deviation            Wrist pronation            Wrist supination            (Blank rows = not tested) PASSIVE ROM - supine flexion 152; scaption 147; ER  90   AAROM/PROM supine - 04/19/23: PROM ~ 140 deg flexion; 92 deg abduction in scapular plane; 70 deg ER in scapular plane    UPPER EXTREMITY MMT: initial evaluation - strength not assessed - moves Lt UE well against gravity; limited by pain  MMT Right 07/04/23 Left 07/04/23  Shoulder flexion 5 5-  Shoulder extension 5 5  Shoulder abduction 5 4+   Shoulder adduction    Shoulder internal rotation 5 5-  Shoulder external rotation 5 4-  Middle trapezius    Lower trapezius    Elbow flexion    Elbow extension    Wrist flexion    Wrist extension    Wrist ulnar deviation    Wrist radial deviation    Wrist pronation    Wrist supination    Grip strength (lbs)    (Blank rows = not tested) 05/17/23:  Grip - R 37; L 18 Lateral pinch R 4.5; L 1.5 Three jaw: R 3.5; L .5     05/26/23:  Grip - R 37; L 26 Lateral pinch R 4.5; L 2 Three jaw: R 3.5; L .6     06/07/23:  Grip - R 37; L 33 Lateral pinch R 4.5; L 4 Three jaw: R 3.5; L 3.5    06/07/23:  Grip - R 37; L 37 Lateral pinch R 5; L4.5 Three jaw: R 5; L 5    JOINT MOBILITY TESTING:  Significant joint tightness Lt shoulder in all planes  02/02/23: Significant joint tightness Lt shoulder in all planes 07/04/23: joint tightness end ranges L GH joint; significant improvement in scapular control with L shoulder movement   UPPER LIMB TENSION TEST:  Positive L with patient unable to achieve 90 deg shoulder abduction for testing. UE in ~ 60 deg abduction with elbow extension produced radicular symptoms into Lt UE   PALPATION:  Muscular tightness Lt shoulder through the upper trap; pecs; leveator; teres 02/02/23: muscular tightness Lt shoulder girdle in upper trap; pecs; leveator; rhomboids; teres 02/09/23: muscular tightness Lt cervical and upper trap area through the shoulder girdle including pecs; leveator; rhomboids; teres 02/15/23: persistent tightness L cervical and upper trap area  03/15/23: muscular tightness L shoulder girdle  06/07/23: persistent muscular tightness L cervical area into upper trap and biceps  07/04/23: continued tightness L cervical musculature into upper trap and biceps areas   Observations:  04/25/23 - Patient has poor movement quality with over activation of L upper trap with all active movement of L UE  06/07/23: good improvement in quality of movement L UE with  increased activation and control of posterior shoulders girdle  07/04/23 - significant improvement in movement quality with elevation of L UE; continued compensatory movement patterns noted with ER and elevation into ER position     TTreatment                                          DATE: 07/12/2023 Therapeutic Exercise: Pulley  Flexion (PT holding upper trap to decrease scapular elevation last few reps) Horizontal ab/adduction with pulley scap squeeze  Standing  Biceps stretch at table 30 sec x 3  Corner stretch low, mid, high range position 10-20 sec x 2  Doorway stretch 30 sec low middle high x 2  Chest lift/scap squeeze  Shoulder horizontal abduction stretch at wall 20 sec x 3  Supine  Hands behind head for stretch  into ER  Neuromuscular re-education: (HEP)  Patient working in standing - improve smooth control and improve muscular activation with decreased use of L upper trap with active assistive shoulder elevation. Working to facilitate posterior shoulder girdle musculature   Manual Therapy: Skilled palpation to assess resopnse to dry needling and manual work  Civil engineer, contracting instructions: Expect mild to moderate muscle soreness.  Patient Consent Given: Yes Education handout provided: Previously provided Muscles treated: L upper trap; scaleni; cervical paraspinals  Electrical stimulation performed: No Parameters: N/A Treatment response/outcome: decreased palpable tightness  Soft tissue mobilization and trigger point work through L cervical musculature and shoulder girdle including upper trap and leveator; biceps; periscapular musculature; pecs - patient supine  PROM and stretching shoulder flexion; ER  - cervical ROM patient in supine Scapular mobilization; glenohumeral joint mobs in PA and inferior planes   Modalities:   Moist heat cervical and L shoulder x 15 min    Treatment                                          DATE: 07/04/2023 Therapeutic  Exercise: Pulley  Flexion (PT holding upper trap to decrease scapular elevation last few reps) Horizontal ab/adduction with pulley scap squeeze  Standing  Biceps stretch at table 30 sec x 3  Corner stretch low, mid, high range position 10-20 sec x 2  Doorway stretch 30 sec low middle high x 2  Chest lift/scap squeeze  Shoulder horizontal abduction stretch at wall 20 sec x 3  Supine  Hands behind head for stretch into ER  Neuromuscular re-education: (HEP)  Patient working in standing - improve smooth control and improve muscular activation with decreased use of L upper trap with active assistive shoulder elevation. Working to facilitate posterior shoulder girdle musculature   Manual Therapy: Soft tissue mobilization and trigger point work through L cervical musculature and shoulder girdle including upper trap and leveator; biceps; periscapular musculature; pecs - patient supine  PROM and stretching shoulder flexion; ER  - cervical ROM patient in supine Scapular mobilization; glenohumeral joint mobs in PA and inferior planes    Treatment                                          DATE: 06/28/2023 Therapeutic Exercise: Pulley  Flexion (PT holding upper trap to decrease scapular elevation last few reps) Horizontal ab/adduction with pulley scap squeeze  Standing  Biceps stretch at table 30 sec x 3  Corner stretch low, mid, high range position 10-20 sec x 2  Doorway stretch 30 sec low middle high x 2  Chest lift/scap squeeze  Shoulder horizontal abduction stretch at wall 20 sec x 3  Supine  Hands behind head for stretch into ER  Neuromuscular re-education: (HEP)  Patient working in standing - improve smooth control and improve muscular activation with decreased use of L upper trap with active assistive shoulder elevation. Working to facilitate posterior shoulder girdle musculature   Manual Therapy: Soft tissue mobilization and trigger point work through L cervical musculature and  shoulder girdle including upper trap and leveator; biceps; periscapular musculature; pecs - patient prone and supine  PROM and stretching shoulder flexion; ER  - cervical ROM patient in supine Scapular mobilization; glenohumeral joint mobs in PA and inferior planes  Treatment                                          DATE: 06/21/2023 Therapeutic Exercise: Pulley  Flexion (PT holding upper trap to decrease scapular elevation last few reps) Horizontal ab/adduction with pulley scap squeeze  Standing  Biceps stretch at table 30 sec x 3  Corner stretch low, mid, high range position 10-20 sec x 2  Doorway stretch 30 sec low middle high x 2  Chest lift/scap squeeze  Shoulder horizontal abduction stretch at wall 20 sec x 3  Supine  Hands behind head for stretch into ER  Neuromuscular re-education: (HEP)  Patient working in standing - improve smooth control and improve muscular activation with decreased use of L upper trap with active assistive shoulder elevation. Working to facilitate posterior shoulder girdle musculature   Manual Therapy: Soft tissue mobilization and trigger point work through L cervical musculature and shoulder girdle including  upper trap and leveator; biceps; periscapular musculature; pecs PROM and stretching shoulder flexion; scaption; extension; ER  Scapular mobilization; glenohumeral joint mobs in PA and inferior planes    Treatment                                          DATE: 06/16/2023 Therapeutic Exercise: Pulley  Flexion (PT holding upper trap to decrease scapular elevation last few reps) Horizontal ab/adduction with pulley scap squeeze  Standing  Biceps stretch at table 30 sec x 3  Corner stretch low, mid, high range position 10-20 sec x 2  Doorway stretch 30 sec low middle high x 2  Chest lift/scap squeeze  Shoulder horizontal abduction stretch at wall 20 sec x 3   Sidelying  ER 2# DB x 10 x 2  Supine  Hands behind head for stretch into ER   Neuromuscular re-education: (HEP)  Patient working in standing - improve smooth control and improve muscular activation with decreased use of L upper trap with active assistive shoulder elevation. Working to facilitate posterior shoulder girdle musculature   Manual Therapy: Soft tissue mobilization and trigger point work through L cervical musculature and shoulder girdle including  upper trap and leveator; biceps; periscapular musculature; pecs PROM and stretching shoulder flexion; scaption; extension; ER  Scapular mobilization; joint mobs in PA and inferior planes   Treatment                                          DATE: 06/14/2023 Therapeutic Exercise: Pulley  Flexion (PT holding upper trap to decrease scapular elevation last few reps) Horizontal ab/adduction with pulley scap squeeze  Standing  Biceps stretch at table 30 sec x 3  Chest lift/scap squeeze  Shoulder horizontal abduction stretch at wall 20 sec x 3  Corner stretch low, mid, high range position 10-20 sec x 2  Supine  T position for stretch through pecs and UE 10-20 sec x 3 Trunk rotation for stretch for pecs 20-30 sec x 4 Hands behind head for stretch into ER  Neuromuscular re-education: (HEP)  Patient working in sitting - improve smooth control and improve muscular activation with decreased use of L upper trap with active assistive shoulder elevation. Working to facilitate posterior shoulder  girdle musculature   Manual Therapy: Soft tissue mobilization and trigger point work through L cervical musculature and shoulder girdle including  upper trap and leveator; biceps; periscapular musculature; pecs PROM and stretching shoulder flexion; scaption; extension; ER  Scapular mobilization  [facility] Adult PT Treatment:                                                DATE: 06/08/2023 Therapeutic Exercise: Pulleys: flexion, scaption, horiz abd/add Self-massage with soft spiky ball --> L UT, rhomboids, mid traps Standing bicep  stretch at table --> added lateral head tilt Sitting on palm --> UT stretch x30" Modified chest expansion (palms on wall, standing forward over feet) Supine elbow lifts 2#DB --> ribcage stabilization --> elbow circles Manual Therapy: Seated STM/TPR (L) UT/LS --> pin & stretch UT Modalities: Moist heat shoulder/neck x 10 min    PATIENT EDUCATION: Education details: POC; HEP Person educated: Patient Education method: Programmer, multimedia, Demonstration, Tactile cues, Verbal cues, and Handouts Education comprehension: verbalized understanding, returned demonstration, verbal cues required, tactile cues required, and needs further education  HOME EXERCISE PROGRAM: (from treatment prior to surgery 03/14/23)  E-mailed to: [email or web address]   Access Code: [email or web address] URL: [email or web address] Date: 06/21/2023 Prepared by: Corlis Leak  Exercises - Seated Shoulder Flexion AAROM with Pulley Behind  - 2 x daily - 7 x weekly - 1 sets - 10 reps - 10 sec  hold - Seated Shoulder Scaption AAROM with Pulley at Side  - 2 x daily - 7 x weekly - 1 sets - 10 reps - 10sec  hold - Seated Cervical Retraction  - 2 x daily - 7 x weekly - 1-2 sets - 5-10 reps - 10 sec  hold - Seated Scapular Retraction  - 2 x daily - 7 x weekly - 1-2 sets - 10 reps - 10 sec  hold - Standing Scapular Retraction  - 3 x daily - 7 x weekly - 1 sets - 10 reps - 10 sec  hold - Shoulder External Rotation and Scapular Retraction  - 3 x daily - 7 x weekly - 1 sets - 10 reps - 3-5 sec   hold - Seated Shoulder W  - 2 x daily - 7 x weekly - 1 sets - 10 reps - 3 sec  hold - Shoulder External Rotation and Scapular Retraction with Resistance  - 2 x daily - 7 x weekly - 1 sets - 10 reps - 3-5 sec  hold - Shoulder W - External Rotation with Resistance  - 2 x daily - 7 x weekly - 1-2 sets - 10 reps - 3 sec  hold - Standing Bilateral Low Shoulder Row with Anchored Resistance  - 2 x daily - 7 x weekly - 1-3 sets - 10 reps - 2-3 sec  hold -  Drawing Bow  - 1 x daily - 7 x weekly - 1 sets - 10 reps - 3 sec  hold - Prone Scapular Retraction  - 2 x daily - 7 x weekly - 1 sets - 5-10 reps - 3-5 sec  hold - Cat Cow  - 2 x daily - 7 x weekly - 1 sets - 5-10 reps - 3-5 sec  hold - Cat Cow to Child's Pose  - 2 x daily - 7 x weekly - 1 sets - 3 reps - 20-30 sec  hold -  Shoulder Flexion Serratus Activation with Resistance  - 1 x daily - 7 x weekly - 1 sets - 10 reps - 3-5 sec  hold - Wall Clock with Theraband  - 1 x daily - 7 x weekly - 1 sets - 10 reps - 2-3 sec  hold  ASSESSMENT:  CLINICAL IMPRESSION: Patient is progressing well with shoulder rehab. She demonstrates continued improvement in A/PROM; functional mobility and use of L UE. Strength is improved with weakness noted in L ER, abduction and to a lesser extent in flexion and IR. There is continued tightness in the L cervical and shoulder girdle musculature. She has decreased medications and reports improved sleep. Therapy is decreased to 1 time/week.  Note to MD.              04/21/23: ESI went well 04/20/23 and she can see improvement in the L UE symptoms. Manual work decreases muscular tightness and patient does tolerated PROM/stretching better. She continues to have tight but empty end feel. Note some improving muscular tightness through L shoulder girdle with persistent tightness in [facility] joint with decreased mobility and stability in the L scapulothoracic range. Patient is working on her exercises and ROM at home.   OBJECTIVE IMPAIRMENTS: Lt shoulder pain which started with lifting overhead her luggage when travelling. She has had gradually increasing pain and stiffness in the Lt shoulder. She received an injection ~ 1-2 weeks ago with some improvement. Patient presents with poor posture and alignment; limited A and PROM Lt shoulder; functional weakness due to pain; decreased functional activities and pain on a daily basis    GOALS: Goals reviewed with patient? Yes  SHORT TERM GOALS:  Target date: 04/26/2023  Independent in initial HEP  Baseline: Goal status: met  2.  Increase AROM shoulder flexion and abduction to 100-110 degrees  Baseline:  Goal status: met   LONG TERM GOALS: Target date: 08/16/2023  Decrease pain in the Lt shoulder by 75-100% allowing patient to return to normal functional activities  Baseline: pain ~ 60% improved Goal status: no going   2.  AROM Lt shoulder WFL's and pain free  Baseline:  Goal status: on going  3.  Patient reports return to normal functional activities including lifting grandson with minimal to no increase in Lt shoulder pain  Baseline:  Goal status: on going   4.  Independent in HEP  Baseline:  Goal status: on going   PLAN:  PT FREQUENCY: 1-2x/week  PT DURATION: 10 weeks  PLANNED INTERVENTIONS: Therapeutic exercises, Therapeutic activity, Neuromuscular re-education, Patient/Family education, Self Care, Joint mobilization, Aquatic Therapy, Dry Needling, Electrical stimulation, Spinal mobilization, Cryotherapy, Moist heat, Taping, Vasopneumatic device, Ultrasound, Ionotophoresis 4mg /ml Dexamethasone, Manual therapy, and Re-evaluation  PLAN FOR NEXT SESSION: review and progress exercises; continue with postural correction and education; manual work, DN, modalities as indicated    [employer], PT 07/12/2023, 12:40 PM

## 2023-07-19 ENCOUNTER — Encounter: Payer: Self-pay | Admitting: Rehabilitative and Restorative Service Providers"

## 2023-07-19 ENCOUNTER — Ambulatory Visit: Payer: BC Managed Care – PPO | Admitting: Rehabilitative and Restorative Service Providers"

## 2023-07-19 DIAGNOSIS — Z114 Encounter for screening for human immunodeficiency virus [HIV]: Secondary | ICD-10-CM | POA: Diagnosis not present

## 2023-07-19 DIAGNOSIS — M6281 Muscle weakness (generalized): Secondary | ICD-10-CM | POA: Diagnosis not present

## 2023-07-19 DIAGNOSIS — Z23 Encounter for immunization: Secondary | ICD-10-CM | POA: Diagnosis not present

## 2023-07-19 DIAGNOSIS — R29898 Other symptoms and signs involving the musculoskeletal system: Secondary | ICD-10-CM | POA: Diagnosis not present

## 2023-07-19 DIAGNOSIS — M25512 Pain in left shoulder: Secondary | ICD-10-CM

## 2023-07-19 DIAGNOSIS — G8929 Other chronic pain: Secondary | ICD-10-CM | POA: Diagnosis not present

## 2023-07-19 DIAGNOSIS — E559 Vitamin D deficiency, unspecified: Secondary | ICD-10-CM | POA: Diagnosis not present

## 2023-07-19 DIAGNOSIS — Z791 Long term (current) use of non-steroidal anti-inflammatories (NSAID): Secondary | ICD-10-CM | POA: Diagnosis not present

## 2023-07-19 DIAGNOSIS — K760 Fatty (change of) liver, not elsewhere classified: Secondary | ICD-10-CM | POA: Diagnosis not present

## 2023-07-19 DIAGNOSIS — F419 Anxiety disorder, unspecified: Secondary | ICD-10-CM | POA: Diagnosis not present

## 2023-07-19 DIAGNOSIS — F32A Depression, unspecified: Secondary | ICD-10-CM | POA: Diagnosis not present

## 2023-07-19 DIAGNOSIS — Z1159 Encounter for screening for other viral diseases: Secondary | ICD-10-CM | POA: Diagnosis not present

## 2023-07-19 DIAGNOSIS — M25511 Pain in right shoulder: Secondary | ICD-10-CM | POA: Diagnosis not present

## 2023-07-19 NOTE — Therapy (Signed)
OUTPATIENT PHYSICAL THERAPY SHOULDER TREATMENT   Patient Name: Lori King MRN: [id number] DOB:[birth year], 57 y.o., female Today's Date: 07/19/2023  END OF SESSION:  PT End of Session - 07/19/23 1106     Visit Number 32    Number of Visits 34    Date for PT Re-Evaluation 08/16/23    Authorization Type [employer] [id number]    PT Start Time 1104    PT Stop Time 1157    PT Time Calculation (min) 53 min    Activity Tolerance Patient tolerated treatment well             Past Medical History:  Diagnosis Date   Adhesive capsulitis of right shoulder    Allergic rhinitis, seasonal    Anemia    Anxiety    MDD (major depressive disorder)    Wears contact lenses    Past Surgical History:  Procedure Laterality Date   BREAST BIOPSY Left 2010   x2 per pt benign   CESAREAN SECTION  X2  last one 02-28-2001   W/  BILATERAL TUBAL LIGATION w/ last c/s   COLONOSCOPY  11/01/2017   HYSTEROSCOPY W/ ENDOMETRIAL ABLATION  2019   LUMBAR LAMINECTOMY/DECOMPRESSION MICRODISCECTOMY  2013   L4  --- S1   SHOULDER ARTHROSCOPY Right 08/28/2020   Procedure: ARTHROSCOPY SHOULDER lysis of adhesions and manipulation under anesthesia, extensive debridement;  Surgeon: Yolonda Kida, MD;  Location: [facility];  Service: Orthopedics;  Laterality: Right;   There are no problems to display for this patient.   PCP: Dr Angelica Chessman  REFERRING PROVIDER: Dr Arlyce Harman  REFERRING DIAG: pain of Lt shoulder  THERAPY DIAG:  Acute pain of left shoulder  Muscle weakness (generalized)  Other symptoms and signs involving the musculoskeletal system  Chronic right shoulder pain  Rationale for Evaluation and Treatment: Rehabilitation  ONSET DATE: 10/24/22  SUBJECTIVE:                                                                                                                                                                                      SUBJECTIVE  STATEMENT: Patient reports that she has been working on her exercises and exercising in the pool. She was in the pool yesterday and is sore today.   07/12/23: Patient reports that she saw the MD last week and he is pleased with progress. He wants her to have a saline injection in the L shoulder to help restore ER. MD also suggested possible follow up injection for cervical spine if symptoms persist. She is continuing to decrease meds and use L UE for more functional activities. Working on exercises especially doorway stretches. Feels  that her strength is coming along nicely. She is doing some exercises in the water about twice a week and that feels good.   03/15/23 RE-EVAL: Patient underwent L shoulder surgery with extensive debridement; labral repair; biceps tendon release; removal of bone spurs 03/14/23.  She has minimal pain due to remaining effects of spinal block for surgery. Bulky dressing limits movement and the effect of ice. Remains in the sling.   Cervical MRI shows degenerative changes C5/6. MD will send order for cervical spine.   Hand dominance: Right  PERTINENT HISTORY: Rt shoulder pain 3 yrs with shoulder surgery for labrum tear and biceps tendon; adhesive capsulitis; reactive hypoglycemic disease; fatty liver  PAIN:  Are you having pain? Yes: NPRS scale: 0-1/10 shoulder/biceps  - sore  Pain location: R shoulder/arm/shoulder blade area  Pain description: sharp; aching; stabbing pain in biceps radiating to elbow with big reaching to the side  Aggravating factors: reaching overhead to the side and behind  Relieving factors: ice; TENs unit   PRECAUTIONS: None  WEIGHT BEARING RESTRICTIONS: No  FALLS:  Has patient fallen in last 6 months? No   PATIENT GOALS:get rid of the shoulder pain and increase ROM   NEXT MD VISIT: 06/09/23  OBJECTIVE:   DIAGNOSTIC FINDINGS:  None available for shoulder; x-ray (-)   02/13/23 MRI Lt shoulder -  some joint space narrowing, subchondral  sclerosis, subchondral cyst formation and osteophyte formation in the glenohumeral joint, indicative of osteoarthritis.  POSTURE: Patient presents with head forward posture with increased thoracic kyphosis; shoulders rounded and elevated; scapulae abducted and rotated along the thoracic spine; head of the humerus anterior in orientation.  CERVICAL ROM: 02/02/23 Flexion 43 tight pain Lt upper trap  Extension 48 tight ant/post neck R lateral flexion 36 tight L cervical  L lateral flexion 42 R rotation 71 L rotation 62 tight pain Lt cervical to upper trap     CERVICAL ROM: 06/07/23 Flexion 48 tight pain Lt upper trap  Extension 51 tight post neck R lateral flexion 34 tight L cervical  L lateral flexion 34 tight  R rotation 64 tight  L rotation 62 tight   UPPER EXTREMITY ROM:   Active ROM Right eval Left eval Left  01/11/23 Left  02/02/23 Left(passive) 03/15/23 Left passive 03/29/23  AAROM  04/04/23 AAROM  06/07/23 AROM  Standing  07/04/23 PROM Supine  07/04/23  Shoulder flexion 160 112 130 107 pain  80 125 138 150 146 158  Shoulder extension 59 25 48 31 pain  NT NT  57 50 60  Shoulder abduction (scaption) 168 112 133 92 pain  NT 84 92 145 140 151  Shoulder adduction            Shoulder internal rotation T7 Thumb to waist Thumb  T12 Hand lateral hip pain  NT neutral  Hand to waist  Thumb L1   Shoulder external rotation (in scapular plane) 95 85 93 45 pain  NT 70 75 70 67 75  Elbow flexion     120 WNL      Elbow extension     0 WFL      Wrist flexion            Wrist extension            Wrist ulnar deviation            Wrist radial deviation            Wrist pronation  Wrist supination            (Blank rows = not tested) PASSIVE ROM - supine flexion 152; scaption 147; ER 90   AAROM/PROM supine - 04/19/23: PROM ~ 140 deg flexion; 92 deg abduction in scapular plane; 70 deg ER in scapular plane    UPPER EXTREMITY MMT: initial evaluation - strength not assessed - moves  Lt UE well against gravity; limited by pain  MMT Right 07/04/23 Left 07/04/23  Shoulder flexion 5 5-  Shoulder extension 5 5  Shoulder abduction 5 4+  Shoulder adduction    Shoulder internal rotation 5 5-  Shoulder external rotation 5 4-  Middle trapezius    Lower trapezius    Elbow flexion    Elbow extension    Wrist flexion    Wrist extension    Wrist ulnar deviation    Wrist radial deviation    Wrist pronation    Wrist supination    Grip strength (lbs)    (Blank rows = not tested) 05/17/23:  Grip - R 37; L 18 Lateral pinch R 4.5; L 1.5 Three jaw: R 3.5; L .5     05/26/23:  Grip - R 37; L 26 Lateral pinch R 4.5; L 2 Three jaw: R 3.5; L .6     06/07/23:  Grip - R 37; L 33 Lateral pinch R 4.5; L 4 Three jaw: R 3.5; L 3.5    06/07/23:  Grip - R 37; L 37 Lateral pinch R 5; L4.5 Three jaw: R 5; L 5    JOINT MOBILITY TESTING:  Significant joint tightness Lt shoulder in all planes  02/02/23: Significant joint tightness Lt shoulder in all planes 07/04/23: joint tightness end ranges L GH joint; significant improvement in scapular control with L shoulder movement   UPPER LIMB TENSION TEST:  Positive L with patient unable to achieve 90 deg shoulder abduction for testing. UE in ~ 60 deg abduction with elbow extension produced radicular symptoms into Lt UE   PALPATION:  Muscular tightness Lt shoulder through the upper trap; pecs; leveator; teres 02/02/23: muscular tightness Lt shoulder girdle in upper trap; pecs; leveator; rhomboids; teres 02/09/23: muscular tightness Lt cervical and upper trap area through the shoulder girdle including pecs; leveator; rhomboids; teres 02/15/23: persistent tightness L cervical and upper trap area  03/15/23: muscular tightness L shoulder girdle  06/07/23: persistent muscular tightness L cervical area into upper trap and biceps  07/04/23: continued tightness L cervical musculature into upper trap and biceps areas   Observations:  04/25/23 - Patient  has poor movement quality with over activation of L upper trap with all active movement of L UE  06/07/23: good improvement in quality of movement L UE with increased activation and control of posterior shoulders girdle  07/04/23 - significant improvement in movement quality with elevation of L UE; continued compensatory movement patterns noted with ER and elevation into ER position   Treatment                                          DATE: 07/19/2023 Therapeutic Exercise: Pulley  Flexion (PT holding upper trap to decrease scapular elevation last few reps) Horizontal ab/adduction with pulley scap squeeze  Standing  Corner stretch low, mid, high range position 10-20 sec x 2  Doorway stretch 30 sec low middle high x 2  Chest lift/scap squeeze  Shoulder horizontal  abduction stretch at wall 20 sec x 3  Shoulder horizontal abduction at wall elbow at 90 deg 20 sec x 3  Ball on wall at dorsum of hand working on ER in abduction 90/90 position ~ 1-2 min  Supine  Hands behind head for stretch into ER  Manual Therapy: Soft tissue mobilization and trigger point work through L cervical musculature and shoulder girdle including upper trap and leveator; biceps; periscapular musculature; pecs - patient supine  PROM and stretching shoulder flexion; ER and cervical ROM patient in supine Scapular mobilization; glenohumeral joint mobs in PA and inferior planes   TTreatment                                          DATE: 07/12/2023 Therapeutic Exercise: Pulley  Flexion (PT holding upper trap to decrease scapular elevation last few reps) Horizontal ab/adduction with pulley scap squeeze  Standing  Biceps stretch at table 30 sec x 3  Corner stretch low, mid, high range position 10-20 sec x 2  Doorway stretch 30 sec low middle high x 2  Chest lift/scap squeeze  Shoulder horizontal abduction stretch at wall 20 sec x 3  Supine  Hands behind head for stretch into ER  Neuromuscular re-education: (HEP)  Patient  working in standing - improve smooth control and improve muscular activation with decreased use of L upper trap with active assistive shoulder elevation. Working to facilitate posterior shoulder girdle musculature   Manual Therapy: Skilled palpation to assess resopnse to dry needling and manual work  Civil engineer, contracting instructions: Expect mild to moderate muscle soreness.  Patient Consent Given: Yes Education handout provided: Previously provided Muscles treated: L upper trap; scaleni; cervical paraspinals  Electrical stimulation performed: No Parameters: N/A Treatment response/outcome: decreased palpable tightness  Soft tissue mobilization and trigger point work through L cervical musculature and shoulder girdle including upper trap and leveator; biceps; periscapular musculature; pecs - patient supine  PROM and stretching shoulder flexion; ER  - cervical ROM patient in supine Scapular mobilization; glenohumeral joint mobs in PA and inferior planes   Modalities:   Moist heat cervical and L shoulder x 15 min   PATIENT EDUCATION: Education details: POC; HEP Person educated: Patient Education method: Programmer, multimedia, Facilities manager, Actor cues, Verbal cues, and Handouts Education comprehension: verbalized understanding, returned demonstration, verbal cues required, tactile cues required, and needs further education  HOME EXERCISE PROGRAM: (from treatment prior to surgery 03/14/23)  E-mailed to: [email or web address]   Access Code: [email or web address] URL: [email or web address] Date: 07/19/2023 Prepared by: Corlis Leak  Exercises - Seated Shoulder Flexion AAROM with Pulley Behind  - 2 x daily - 7 x weekly - 1 sets - 10 reps - 10 sec  hold - Seated Shoulder Scaption AAROM with Pulley at Side  - 2 x daily - 7 x weekly - 1 sets - 10 reps - 10sec  hold - Seated Cervical Retraction  - 2 x daily - 7 x weekly - 1-2 sets - 5-10 reps - 10 sec  hold - Seated Scapular Retraction   - 2 x daily - 7 x weekly - 1-2 sets - 10 reps - 10 sec  hold - Standing Scapular Retraction  - 3 x daily - 7 x weekly - 1 sets - 10 reps - 10 sec  hold - Shoulder External Rotation and Scapular Retraction  - 3 x daily - 7 x weekly -  1 sets - 10 reps - 3-5 sec   hold - Seated Shoulder W  - 2 x daily - 7 x weekly - 1 sets - 10 reps - 3 sec  hold - Shoulder External Rotation and Scapular Retraction with Resistance  - 2 x daily - 7 x weekly - 1 sets - 10 reps - 3-5 sec  hold - Shoulder W - External Rotation with Resistance  - 2 x daily - 7 x weekly - 1-2 sets - 10 reps - 3 sec  hold - Standing Bilateral Low Shoulder Row with Anchored Resistance  - 2 x daily - 7 x weekly - 1-3 sets - 10 reps - 2-3 sec  hold - Drawing Bow  - 1 x daily - 7 x weekly - 1 sets - 10 reps - 3 sec  hold - Prone Scapular Retraction  - 2 x daily - 7 x weekly - 1 sets - 5-10 reps - 3-5 sec  hold - Cat Cow  - 2 x daily - 7 x weekly - 1 sets - 5-10 reps - 3-5 sec  hold - Cat Cow to Child's Pose  - 2 x daily - 7 x weekly - 1 sets - 3 reps - 20-30 sec  hold - Shoulder Flexion Serratus Activation with Resistance  - 1 x daily - 7 x weekly - 1 sets - 10 reps - 3-5 sec  hold - Wall Clock with Theraband  - 1 x daily - 7 x weekly - 1 sets - 10 reps - 2-3 sec  hold - Isometric Shoulder External Rotation in Abduction with Ball at Wall  - 1 x daily - 7 x weekly - 1-2 min hold - Single Arm Doorway Pec Stretch at 90 Degrees Abduction  - 2 x daily - 7 x weekly - 1 sets - 3 reps - 30 sec  hold  ASSESSMENT:  CLINICAL IMPRESSION: Patient is progressing well with shoulder rehab. She demonstrates continued improvement in A/PROM in L shoulder; functional mobility and use of L UE. Strength is improving with continued weakness in L ER, abduction and to a lesser extent in flexion and IR. There is continued tightness in the L cervical and shoulder girdle musculature. She has decreased medications and reports improved sleep.    04/21/23: ESI went well  04/20/23 and she can see improvement in the L UE symptoms. Manual work decreases muscular tightness and patient does tolerated PROM/stretching better. She continues to have tight but empty end feel. Note some improving muscular tightness through L shoulder girdle with persistent tightness in [facility] joint with decreased mobility and stability in the L scapulothoracic range. Patient is working on her exercises and ROM at home.   OBJECTIVE IMPAIRMENTS: Lt shoulder pain which started with lifting overhead her luggage when travelling. She has had gradually increasing pain and stiffness in the Lt shoulder. She received an injection ~ 1-2 weeks ago with some improvement. Patient presents with poor posture and alignment; limited A and PROM Lt shoulder; functional weakness due to pain; decreased functional activities and pain on a daily basis    GOALS: Goals reviewed with patient? Yes  SHORT TERM GOALS: Target date: 04/26/2023  Independent in initial HEP  Baseline: Goal status: met  2.  Increase AROM shoulder flexion and abduction to 100-110 degrees  Baseline:  Goal status: met   LONG TERM GOALS: Target date: 08/16/2023  Decrease pain in the Lt shoulder by 75-100% allowing patient to return to normal functional activities  Baseline: pain ~ 60% improved Goal status: no going   2.  AROM Lt shoulder WFL's and pain free  Baseline:  Goal status: on going  3.  Patient reports return to normal functional activities including lifting grandson with minimal to no increase in Lt shoulder pain  Baseline:  Goal status: on going   4.  Independent in HEP  Baseline:  Goal status: on going   PLAN:  PT FREQUENCY: 1-2x/week  PT DURATION: 10 weeks  PLANNED INTERVENTIONS: Therapeutic exercises, Therapeutic activity, Neuromuscular re-education, Patient/Family education, Self Care, Joint mobilization, Aquatic Therapy, Dry Needling, Electrical stimulation, Spinal mobilization, Cryotherapy, Moist heat, Taping,  Vasopneumatic device, Ultrasound, Ionotophoresis 4mg /ml Dexamethasone, Manual therapy, and Re-evaluation  PLAN FOR NEXT SESSION: review and progress exercises; continue with postural correction and education; manual work, DN, modalities as indicated    [employer], PT 07/19/2023, 11:57 AM

## 2023-07-26 ENCOUNTER — Encounter: Payer: Self-pay | Admitting: Rehabilitative and Restorative Service Providers"

## 2023-07-26 ENCOUNTER — Ambulatory Visit: Payer: BC Managed Care – PPO | Admitting: Rehabilitative and Restorative Service Providers"

## 2023-07-26 DIAGNOSIS — R29898 Other symptoms and signs involving the musculoskeletal system: Secondary | ICD-10-CM

## 2023-07-26 DIAGNOSIS — M6281 Muscle weakness (generalized): Secondary | ICD-10-CM | POA: Diagnosis not present

## 2023-07-26 DIAGNOSIS — G8929 Other chronic pain: Secondary | ICD-10-CM

## 2023-07-26 DIAGNOSIS — M25512 Pain in left shoulder: Secondary | ICD-10-CM

## 2023-07-26 DIAGNOSIS — M25511 Pain in right shoulder: Secondary | ICD-10-CM | POA: Diagnosis not present

## 2023-07-26 NOTE — Therapy (Signed)
OUTPATIENT PHYSICAL THERAPY SHOULDER TREATMENT   Patient Name: Lori King MRN: [id number] DOB:1965-10-17, 57 y.o., female Today's Date: 07/26/2023  END OF SESSION:  PT End of Session - 07/26/23 1106     Visit Number 33    Number of Visits 34    Date for PT Re-Evaluation 08/16/23    Authorization Type [employer] [id number]    PT Start Time 1103    PT Stop Time 1155    PT Time Calculation (min) 52 min    Activity Tolerance Patient tolerated treatment well             Past Medical History:  Diagnosis Date   Adhesive capsulitis of right shoulder    Allergic rhinitis, seasonal    Anemia    Anxiety    MDD (major depressive disorder)    Wears contact lenses    Past Surgical History:  Procedure Laterality Date   BREAST BIOPSY Left 2010   x2 per pt benign   CESAREAN SECTION  X2  last one 02-28-2001   W/  BILATERAL TUBAL LIGATION w/ last c/s   COLONOSCOPY  11/01/2017   HYSTEROSCOPY W/ ENDOMETRIAL ABLATION  2019   LUMBAR LAMINECTOMY/DECOMPRESSION MICRODISCECTOMY  2013   L4  --- S1   SHOULDER ARTHROSCOPY Right 08/28/2020   Procedure: ARTHROSCOPY SHOULDER lysis of adhesions and manipulation under anesthesia, extensive debridement;  Surgeon: Yolonda Kida, MD;  Location: [facility];  Service: Orthopedics;  Laterality: Right;   There are no problems to display for this patient.   PCP: Dr Angelica Chessman  REFERRING PROVIDER: Dr Arlyce Harman  REFERRING DIAG: pain of Lt shoulder  THERAPY DIAG:  Acute pain of left shoulder  Muscle weakness (generalized)  Other symptoms and signs involving the musculoskeletal system  Chronic right shoulder pain  Rationale for Evaluation and Treatment: Rehabilitation  ONSET DATE: 10/24/22  SUBJECTIVE:                                                                                                                                                                                      SUBJECTIVE  STATEMENT: Patient reports that she has not been working on her exercises as much the past week because she hsa had a cold and has been caring for her grandson while her daughter and son in law are moving to a new home. Feeling some catching in the L arm at times with certain movements.   07/12/23: Patient reports that she saw the MD last week and he is pleased with progress. He wants her to have a saline injection in the L shoulder to help restore ER. MD also suggested possible follow  up injection for cervical spine if symptoms persist. She is continuing to decrease meds and use L UE for more functional activities. Working on exercises especially doorway stretches. Feels that her strength is coming along nicely. She is doing some exercises in the water about twice a week and that feels good.   03/15/23 RE-EVAL: Patient underwent L shoulder surgery with extensive debridement; labral repair; biceps tendon release; removal of bone spurs 03/14/23.  She has minimal pain due to remaining effects of spinal block for surgery. Bulky dressing limits movement and the effect of ice. Remains in the sling.   Cervical MRI shows degenerative changes C5/6. MD will send order for cervical spine.   Hand dominance: Right  PERTINENT HISTORY: Rt shoulder pain 3 yrs with shoulder surgery for labrum tear and biceps tendon; adhesive capsulitis; reactive hypoglycemic disease; fatty liver  PAIN:  Are you having pain? Yes: NPRS scale: 0-1/10 shoulder/biceps  - sore  Pain location: R shoulder/arm/shoulder blade area  Pain description: sharp; aching; stabbing pain in biceps radiating to elbow with big reaching to the side  Aggravating factors: reaching overhead to the side and behind  Relieving factors: ice; TENs unit   PRECAUTIONS: None  WEIGHT BEARING RESTRICTIONS: No  FALLS:  Has patient fallen in last 6 months? No   PATIENT GOALS:get rid of the shoulder pain and increase ROM   NEXT MD VISIT:  06/09/23  OBJECTIVE:   DIAGNOSTIC FINDINGS:  None available for shoulder; x-ray (-)   02/13/23 MRI Lt shoulder -  some joint space narrowing, subchondral sclerosis, subchondral cyst formation and osteophyte formation in the glenohumeral joint, indicative of osteoarthritis.  POSTURE: Patient presents with head forward posture with increased thoracic kyphosis; shoulders rounded and elevated; scapulae abducted and rotated along the thoracic spine; head of the humerus anterior in orientation.  CERVICAL ROM: 02/02/23 Flexion 43 tight pain Lt upper trap  Extension 48 tight ant/post neck R lateral flexion 36 tight L cervical  L lateral flexion 42 R rotation 71 L rotation 62 tight pain Lt cervical to upper trap     CERVICAL ROM: 06/07/23 Flexion 48 tight pain Lt upper trap  Extension 51 tight post neck R lateral flexion 34 tight L cervical  L lateral flexion 34 tight  R rotation 64 tight  L rotation 62 tight   UPPER EXTREMITY ROM:   Active ROM Right eval Left eval Left  01/11/23 Left  02/02/23 Left(passive) 03/15/23 Left passive 03/29/23  AAROM  04/04/23 AAROM  06/07/23 AROM  Standing  07/04/23 PROM Supine  07/04/23  Shoulder flexion 160 112 130 107 pain  80 125 138 150 146 158  Shoulder extension 59 25 48 31 pain  NT NT  57 50 60  Shoulder abduction (scaption) 168 112 133 92 pain  NT 84 92 145 140 151  Shoulder adduction            Shoulder internal rotation T7 Thumb to waist Thumb  T12 Hand lateral hip pain  NT neutral  Hand to waist  Thumb L1   Shoulder external rotation (in scapular plane) 95 85 93 45 pain  NT 70 75 70 67 75  Elbow flexion     120 WNL      Elbow extension     0 WFL      Wrist flexion            Wrist extension            Wrist ulnar deviation  Wrist radial deviation            Wrist pronation            Wrist supination            (Blank rows = not tested) PASSIVE ROM - supine flexion 152; scaption 147; ER 90   AAROM/PROM supine - 04/19/23: PROM ~  140 deg flexion; 92 deg abduction in scapular plane; 70 deg ER in scapular plane    UPPER EXTREMITY MMT: initial evaluation - strength not assessed - moves Lt UE well against gravity; limited by pain  MMT Right 07/04/23 Left 07/04/23  Shoulder flexion 5 5-  Shoulder extension 5 5  Shoulder abduction 5 4+  Shoulder adduction    Shoulder internal rotation 5 5-  Shoulder external rotation 5 4-  Middle trapezius    Lower trapezius    Elbow flexion    Elbow extension    Wrist flexion    Wrist extension    Wrist ulnar deviation    Wrist radial deviation    Wrist pronation    Wrist supination    Grip strength (lbs)    (Blank rows = not tested) 05/17/23:  Grip - R 37; L 18 Lateral pinch R 4.5; L 1.5 Three jaw: R 3.5; L .5     05/26/23:  Grip - R 37; L 26 Lateral pinch R 4.5; L 2 Three jaw: R 3.5; L .6     06/07/23:  Grip - R 37; L 33 Lateral pinch R 4.5; L 4 Three jaw: R 3.5; L 3.5    06/07/23:  Grip - R 37; L 37 Lateral pinch R 5; L4.5 Three jaw: R 5; L 5    JOINT MOBILITY TESTING:  Significant joint tightness Lt shoulder in all planes  02/02/23: Significant joint tightness Lt shoulder in all planes 07/04/23: joint tightness end ranges L GH joint; significant improvement in scapular control with L shoulder movement   UPPER LIMB TENSION TEST:  Positive L with patient unable to achieve 90 deg shoulder abduction for testing. UE in ~ 60 deg abduction with elbow extension produced radicular symptoms into Lt UE   PALPATION:  Muscular tightness Lt shoulder through the upper trap; pecs; leveator; teres 02/02/23: muscular tightness Lt shoulder girdle in upper trap; pecs; leveator; rhomboids; teres 02/09/23: muscular tightness Lt cervical and upper trap area through the shoulder girdle including pecs; leveator; rhomboids; teres 02/15/23: persistent tightness L cervical and upper trap area  03/15/23: muscular tightness L shoulder girdle  06/07/23: persistent muscular tightness L cervical  area into upper trap and biceps  07/04/23: continued tightness L cervical musculature into upper trap and biceps areas   Observations:  04/25/23 - Patient has poor movement quality with over activation of L upper trap with all active movement of L UE  06/07/23: good improvement in quality of movement L UE with increased activation and control of posterior shoulders girdle  07/04/23 - significant improvement in movement quality with elevation of L UE; continued compensatory movement patterns noted with ER and elevation into ER position   Treatment                                          DATE: 07/26/2023 Therapeutic Exercise: Pulley  Flexion (PT holding upper trap to decrease scapular elevation last few reps) Horizontal ab/adduction with pulley scap squeeze  Standing  Corner  stretch low, mid, high range position 10-20 sec x 2  Doorway stretch 30 sec low middle high x 2  Chest lift/scap squeeze  Shoulder horizontal abduction stretch at wall 20 sec x 3  Shoulder horizontal abduction at wall elbow at 90 deg 20 sec x 3  Shoulder abduction isometric at wall 3 sec x 10  Supine  Hands behind head for stretch into ER  Manual Therapy: Soft tissue mobilization and trigger point work through L cervical musculature and shoulder girdle including upper trap and leveator; biceps; periscapular musculature; pecs, focus on the deltoid area today - patient supine  PROM and stretching shoulder flexion; ER and cervical ROM patient in supine Scapular mobilization; glenohumeral joint mobs in PA and inferior planes    Treatment                                          DATE: 07/19/2023 Therapeutic Exercise: Pulley  Flexion (PT holding upper trap to decrease scapular elevation last few reps) Horizontal ab/adduction with pulley scap squeeze  Standing  Corner stretch low, mid, high range position 10-20 sec x 2  Doorway stretch 30 sec low middle high x 2  Chest lift/scap squeeze  Shoulder horizontal abduction  stretch at wall 20 sec x 3  Shoulder horizontal abduction at wall elbow at 90 deg 20 sec x 3  Ball on wall at dorsum of hand working on ER in abduction 90/90 position ~ 1-2 min  Supine  Hands behind head for stretch into ER  Manual Therapy: Soft tissue mobilization and trigger point work through L cervical musculature and shoulder girdle including upper trap and leveator; biceps; periscapular musculature; pecs - patient supine  PROM and stretching shoulder flexion; ER and cervical ROM patient in supine Scapular mobilization; glenohumeral joint mobs in PA and inferior planes   TTreatment                                          DATE: 07/12/2023 Therapeutic Exercise: Pulley  Flexion (PT holding upper trap to decrease scapular elevation last few reps) Horizontal ab/adduction with pulley scap squeeze  Standing  Biceps stretch at table 30 sec x 3  Corner stretch low, mid, high range position 10-20 sec x 2  Doorway stretch 30 sec low middle high x 2  Chest lift/scap squeeze  Shoulder horizontal abduction stretch at wall 20 sec x 3  Supine  Hands behind head for stretch into ER  Neuromuscular re-education: (HEP)  Patient working in standing - improve smooth control and improve muscular activation with decreased use of L upper trap with active assistive shoulder elevation. Working to facilitate posterior shoulder girdle musculature   Manual Therapy: Skilled palpation to assess resopnse to dry needling and manual work  Civil engineer, contracting instructions: Expect mild to moderate muscle soreness.  Patient Consent Given: Yes Education handout provided: Previously provided Muscles treated: L upper trap; scaleni; cervical paraspinals  Electrical stimulation performed: No Parameters: N/A Treatment response/outcome: decreased palpable tightness  Soft tissue mobilization and trigger point work through L cervical musculature and shoulder girdle including upper trap and leveator;  biceps; periscapular musculature; pecs - patient supine  PROM and stretching shoulder flexion; ER  - cervical ROM patient in supine Scapular mobilization; glenohumeral joint mobs in PA and  inferior planes   Modalities:   Moist heat cervical and L shoulder x 15 min   PATIENT EDUCATION: Education details: POC; HEP Person educated: Patient Education method: Programmer, multimedia, Demonstration, Tactile cues, Verbal cues, and Handouts Education comprehension: verbalized understanding, returned demonstration, verbal cues required, tactile cues required, and needs further education  HOME EXERCISE PROGRAM: (from treatment prior to surgery 03/14/23)  E-mailed to: [email or web address]   Access Code: [email or web address] URL: [email or web address] Date: 07/19/2023 Prepared by: Corlis Leak  Exercises - Seated Shoulder Flexion AAROM with Pulley Behind  - 2 x daily - 7 x weekly - 1 sets - 10 reps - 10 sec  hold - Seated Shoulder Scaption AAROM with Pulley at Side  - 2 x daily - 7 x weekly - 1 sets - 10 reps - 10sec  hold - Seated Cervical Retraction  - 2 x daily - 7 x weekly - 1-2 sets - 5-10 reps - 10 sec  hold - Seated Scapular Retraction  - 2 x daily - 7 x weekly - 1-2 sets - 10 reps - 10 sec  hold - Standing Scapular Retraction  - 3 x daily - 7 x weekly - 1 sets - 10 reps - 10 sec  hold - Shoulder External Rotation and Scapular Retraction  - 3 x daily - 7 x weekly - 1 sets - 10 reps - 3-5 sec   hold - Seated Shoulder W  - 2 x daily - 7 x weekly - 1 sets - 10 reps - 3 sec  hold - Shoulder External Rotation and Scapular Retraction with Resistance  - 2 x daily - 7 x weekly - 1 sets - 10 reps - 3-5 sec  hold - Shoulder W - External Rotation with Resistance  - 2 x daily - 7 x weekly - 1-2 sets - 10 reps - 3 sec  hold - Standing Bilateral Low Shoulder Row with Anchored Resistance  - 2 x daily - 7 x weekly - 1-3 sets - 10 reps - 2-3 sec  hold - Drawing Bow  - 1 x daily - 7 x weekly - 1 sets - 10 reps - 3 sec   hold - Prone Scapular Retraction  - 2 x daily - 7 x weekly - 1 sets - 5-10 reps - 3-5 sec  hold - Cat Cow  - 2 x daily - 7 x weekly - 1 sets - 5-10 reps - 3-5 sec  hold - Cat Cow to Child's Pose  - 2 x daily - 7 x weekly - 1 sets - 3 reps - 20-30 sec  hold - Shoulder Flexion Serratus Activation with Resistance  - 1 x daily - 7 x weekly - 1 sets - 10 reps - 3-5 sec  hold - Wall Clock with Theraband  - 1 x daily - 7 x weekly - 1 sets - 10 reps - 2-3 sec  hold - Isometric Shoulder External Rotation in Abduction with Ball at Wall  - 1 x daily - 7 x weekly - 1-2 min hold - Single Arm Doorway Pec Stretch at 90 Degrees Abduction  - 2 x daily - 7 x weekly - 1 sets - 3 reps - 30 sec  hold  ASSESSMENT:  CLINICAL IMPRESSION: Patient has increased discomfort in the L deltoid insertion area with isometric resistance. No pain with active movement and good strength with resistive testing L UE. She has minimal tenderness to palpation in that area. Addressed with isometric shoulder abduction arm at side as  well as STM through area. There is continued tightness in the L cervical and shoulder girdle musculature. She has decreased medications and reports improved sleep. Will reassess at next visit.    04/21/23: ESI went well 04/20/23 and she can see improvement in the L UE symptoms. Manual work decreases muscular tightness and patient does tolerated PROM/stretching better. She continues to have tight but empty end feel. Note some improving muscular tightness through L shoulder girdle with persistent tightness in [facility] joint with decreased mobility and stability in the L scapulothoracic range. Patient is working on her exercises and ROM at home.   OBJECTIVE IMPAIRMENTS: Lt shoulder pain which started with lifting overhead her luggage when travelling. She has had gradually increasing pain and stiffness in the Lt shoulder. She received an injection ~ 1-2 weeks ago with some improvement. Patient presents with poor posture and  alignment; limited A and PROM Lt shoulder; functional weakness due to pain; decreased functional activities and pain on a daily basis    GOALS: Goals reviewed with patient? Yes  SHORT TERM GOALS: Target date: 04/26/2023  Independent in initial HEP  Baseline: Goal status: met  2.  Increase AROM shoulder flexion and abduction to 100-110 degrees  Baseline:  Goal status: met   LONG TERM GOALS: Target date: 08/16/2023  Decrease pain in the Lt shoulder by 75-100% allowing patient to return to normal functional activities  Baseline: pain ~ 60% improved Goal status: no going   2.  AROM Lt shoulder WFL's and pain free  Baseline:  Goal status: on going  3.  Patient reports return to normal functional activities including lifting grandson with minimal to no increase in Lt shoulder pain  Baseline:  Goal status: on going   4.  Independent in HEP  Baseline:  Goal status: on going   PLAN:  PT FREQUENCY: 1-2x/week  PT DURATION: 10 weeks  PLANNED INTERVENTIONS: Therapeutic exercises, Therapeutic activity, Neuromuscular re-education, Patient/Family education, Self Care, Joint mobilization, Aquatic Therapy, Dry Needling, Electrical stimulation, Spinal mobilization, Cryotherapy, Moist heat, Taping, Vasopneumatic device, Ultrasound, Ionotophoresis 4mg /ml Dexamethasone, Manual therapy, and Re-evaluation  PLAN FOR NEXT SESSION: review and progress exercises; continue with postural correction and education; manual work, DN, modalities as indicated    [employer], PT 07/26/2023, 12:46 PM

## 2023-08-02 ENCOUNTER — Ambulatory Visit
Payer: BC Managed Care – PPO | Attending: Orthopedic Surgery | Admitting: Rehabilitative and Restorative Service Providers"

## 2023-08-02 ENCOUNTER — Encounter: Payer: Self-pay | Admitting: Rehabilitative and Restorative Service Providers"

## 2023-08-02 DIAGNOSIS — M6281 Muscle weakness (generalized): Secondary | ICD-10-CM

## 2023-08-02 DIAGNOSIS — M25511 Pain in right shoulder: Secondary | ICD-10-CM | POA: Insufficient documentation

## 2023-08-02 DIAGNOSIS — G8929 Other chronic pain: Secondary | ICD-10-CM | POA: Diagnosis not present

## 2023-08-02 DIAGNOSIS — R29898 Other symptoms and signs involving the musculoskeletal system: Secondary | ICD-10-CM | POA: Diagnosis not present

## 2023-08-02 DIAGNOSIS — M25512 Pain in left shoulder: Secondary | ICD-10-CM

## 2023-08-02 NOTE — Therapy (Addendum)
 OUTPATIENT PHYSICAL THERAPY SHOULDER TREATMENT  PHYSICAL THERAPY DISCHARGE SUMMARY  Visits from Start of Care: 34  Current functional level related to goals / functional outcomes: See progress note and MD note for discharge status    Remaining deficits: Needs to continue with ROM and strengthening home program    Education / Equipment: HEP    Patient agrees to discharge. Patient goals were met. Patient is being discharged due to being pleased with the current functional level.  Annella Prowell P. Leonor Liv PT, MPH 11/10/23 4:42 PM     Patient Name: Lori King MRN: [id number] DOB:[birth year], 57 y.o., female Today's Date: 08/02/2023  END OF SESSION:  PT End of Session - 08/02/23 1105     Visit Number 34    Number of Visits 38    Date for PT Re-Evaluation 08/30/23    Authorization Type [employer] [id number]    PT Start Time 1100    PT Stop Time 1154    PT Time Calculation (min) 54 min             Past Medical History:  Diagnosis Date   Adhesive capsulitis of right shoulder    Allergic rhinitis, seasonal    Anemia    Anxiety    MDD (major depressive disorder)    Wears contact lenses    Past Surgical History:  Procedure Laterality Date   BREAST BIOPSY Left 2010   x2 per pt benign   CESAREAN SECTION  X2  last one 02-28-2001   W/  BILATERAL TUBAL LIGATION w/ last c/s   COLONOSCOPY  11/01/2017   HYSTEROSCOPY W/ ENDOMETRIAL ABLATION  2019   LUMBAR LAMINECTOMY/DECOMPRESSION MICRODISCECTOMY  2013   L4  --- S1   SHOULDER ARTHROSCOPY Right 08/28/2020   Procedure: ARTHROSCOPY SHOULDER lysis of adhesions and manipulation under anesthesia, extensive debridement;  Surgeon: Yolonda Kida, MD;  Location: [facility] [place];  Service: Orthopedics;  Laterality: Right;   There are no problems to display for this patient.   PCP: Dr Angelica Chessman  REFERRING PROVIDER: Dr Arlyce Harman  REFERRING DIAG: pain of Lt shoulder  THERAPY DIAG:  Acute pain of left  shoulder  Muscle weakness (generalized)  Other symptoms and signs involving the musculoskeletal system  Chronic right shoulder pain  Rationale for Evaluation and Treatment: Rehabilitation  ONSET DATE: 10/24/22  SUBJECTIVE:                                                                                                                                                                                      SUBJECTIVE STATEMENT: Patient reports that she has not been working on her exercises. Feels that she would  benefit from continued therapy to progress with ROM and strength.    07/12/23: Patient reports that she saw the MD last week and he is pleased with progress. He wants her to have a saline injection in the L shoulder to help restore ER. MD also suggested possible follow up injection for cervical spine if symptoms persist. She is continuing to decrease meds and use L UE for more functional activities. Working on exercises especially doorway stretches. Feels that her strength is coming along nicely. She is doing some exercises in the water about twice a week and that feels good.   03/15/23 RE-EVAL: Patient underwent L shoulder surgery with extensive debridement; labral repair; biceps tendon release; removal of bone spurs 03/14/23.  She has minimal pain due to remaining effects of spinal block for surgery. Bulky dressing limits movement and the effect of ice. Remains in the sling.   Cervical MRI shows degenerative changes C5/6. MD will send order for cervical spine.   Hand dominance: Right  PERTINENT HISTORY: Rt shoulder pain 3 yrs with shoulder surgery for labrum tear and biceps tendon; adhesive capsulitis; reactive hypoglycemic disease; fatty liver  PAIN:  Are you having pain? Yes: NPRS scale: 0-1/10 shoulder/biceps  - tight  Pain location: R shoulder/arm - deltoid area  Pain description: sharp; aching; stabbing pain in biceps radiating to elbow with big reaching to the side  Aggravating  factors: reaching overhead to the side and behind  Relieving factors: ice; TENs unit   PRECAUTIONS: None  WEIGHT BEARING RESTRICTIONS: No  FALLS:  Has patient fallen in last 6 months? No   PATIENT GOALS:get rid of the shoulder pain and increase ROM   NEXT MD VISIT: 06/09/23  OBJECTIVE:   DIAGNOSTIC FINDINGS:  None available for shoulder; x-ray (-)   02/13/23 MRI Lt shoulder -  some joint space narrowing, subchondral sclerosis, subchondral cyst formation and osteophyte formation in the glenohumeral joint, indicative of osteoarthritis.  POSTURE: Patient presents with head forward posture with increased thoracic kyphosis; shoulders rounded and elevated; scapulae abducted and rotated along the thoracic spine; head of the humerus anterior in orientation.  CERVICAL ROM: 02/02/23 Flexion 43 tight pain Lt upper trap  Extension 48 tight ant/post neck R lateral flexion 36 tight L cervical  L lateral flexion 42 R rotation 71 L rotation 62 tight pain Lt cervical to upper trap     CERVICAL ROM: 06/07/23 Flexion 48 tight pain Lt upper trap  Extension 51 tight post neck R lateral flexion 34 tight L cervical  L lateral flexion 34 tight  R rotation 64 tight  L rotation 62 tight   UPPER EXTREMITY ROM:   Active ROM Right eval Left eval Left  01/11/23 Left  02/02/23 Left(passive) 03/15/23 Left passive 03/29/23  AAROM  04/04/23 AAROM  06/07/23 AROM  Standing  07/04/23 PROM Supine  07/04/23 AROM  Standing  08/02/23  Shoulder flexion 160 112 130 107 pain  80 125 138 150 146 158 145  Shoulder extension 59 25 48 31 pain  NT NT  57 50 60 55  Shoulder abduction (scaption) 168 112 133 92 pain  NT 84 92 145 140 151 144  Shoulder adduction             Shoulder internal rotation T7 Thumb to waist Thumb  T12 Hand lateral hip pain  NT neutral  Hand to waist  Thumb L1  Thumb T 11   Shoulder external rotation (in scapular plane) 95 85 93 45 pain  NT 70  75 70 67 75 79  Elbow flexion     120 WNL        Elbow extension     0 WFL       Wrist flexion             Wrist extension             Wrist ulnar deviation             Wrist radial deviation             Wrist pronation             Wrist supination             (Blank rows = not tested) PASSIVE ROM - supine flexion 152; scaption 147; ER 90   AAROM/PROM supine - 04/19/23: PROM ~ 140 deg flexion; 92 deg abduction in scapular plane; 70 deg ER in scapular plane    UPPER EXTREMITY MMT: initial evaluation - strength not assessed - moves Lt UE well against gravity; limited by pain  MMT Right 07/04/23 Left 07/04/23 Left  08/02/23  Shoulder flexion 5 5- 5  Shoulder extension 5 5 5   Shoulder abduction 5 4+ 5-  Shoulder adduction     Shoulder internal rotation 5 5- 5  Shoulder external rotation 5 4- 4+  Middle trapezius     Lower trapezius     Elbow flexion     Elbow extension     Wrist flexion     Wrist extension     Wrist ulnar deviation     Wrist radial deviation     Wrist pronation     Wrist supination     Grip strength (lbs)     (Blank rows = not tested) 05/17/23:  Grip - R 37; L 18 Lateral pinch R 4.5; L 1.5 Three jaw: R 3.5; L .5     05/26/23:  Grip - R 37; L 26 Lateral pinch R 4.5; L 2 Three jaw: R 3.5; L .6     06/07/23:  Grip - R 37; L 33 Lateral pinch R 4.5; L 4 Three jaw: R 3.5; L 3.5    06/07/23:  Grip - R 37; L 37 Lateral pinch R 5; L4.5 Three jaw: R 5; L 5    JOINT MOBILITY TESTING:  Significant joint tightness Lt shoulder in all planes  02/02/23: Significant joint tightness Lt shoulder in all planes 07/04/23: joint tightness end ranges L GH joint; significant improvement in scapular control with L shoulder movement   UPPER LIMB TENSION TEST:  Positive L with patient unable to achieve 90 deg shoulder abduction for testing. UE in ~ 60 deg abduction with elbow extension produced radicular symptoms into Lt UE   PALPATION:  Muscular tightness Lt shoulder through the upper trap; pecs; leveator; teres 02/02/23:  muscular tightness Lt shoulder girdle in upper trap; pecs; leveator; rhomboids; teres 02/09/23: muscular tightness Lt cervical and upper trap area through the shoulder girdle including pecs; leveator; rhomboids; teres 02/15/23: persistent tightness L cervical and upper trap area  03/15/23: muscular tightness L shoulder girdle  06/07/23: persistent muscular tightness L cervical area into upper trap and biceps  07/04/23: continued tightness L cervical musculature into upper trap and biceps areas   Observations:  04/25/23 - Patient has poor movement quality with over activation of L upper trap with all active movement of L UE  06/07/23: good improvement in quality of movement L UE with increased activation and control of  posterior shoulders girdle  07/04/23 - significant improvement in movement quality with elevation of L UE; continued compensatory movement patterns noted with ER and elevation into ER position   Treatment                                          DATE: 08/02/2023 Therapeutic Exercise: Pulley  Flexion (PT holding upper trap to decrease scapular elevation last few reps) Horizontal ab/adduction with pulley scap squeeze  Standing  Corner stretch low, mid, high range position 10-20 sec x 2  Doorway stretch 30 sec low middle high x 2  Chest lift/scap squeeze  Shoulder horizontal abduction stretch at wall 20 sec x 3  Shoulder horizontal abduction at wall elbow at 90 deg 20 sec x 3  Shoulder abduction isometric at wall 3 sec x 10  Supine  Hands behind head for stretch into ER  Manual Therapy: Soft tissue mobilization and trigger point work through L cervical musculature and shoulder girdle including upper trap and leveator; biceps; periscapular musculature; pecs, focus on the deltoid area today - patient supine  PROM and stretching shoulder flexion; ER and cervical ROM patient in supine Scapular mobilization; glenohumeral joint mobs in PA and inferior planes    Treatment                                           DATE: 07/26/2023 Therapeutic Exercise: Pulley  Flexion (PT holding upper trap to decrease scapular elevation last few reps) Horizontal ab/adduction with pulley scap squeeze  Standing  Corner stretch low, mid, high range position 10-20 sec x 2  Doorway stretch 30 sec low middle high x 2  Chest lift/scap squeeze  Shoulder horizontal abduction stretch at wall 20 sec x 3  Shoulder horizontal abduction at wall elbow at 90 deg 20 sec x 3  Shoulder abduction isometric at wall 3 sec x 10  Supine  Hands behind head for stretch into ER  Manual Therapy: Soft tissue mobilization and trigger point work through L cervical musculature and shoulder girdle including upper trap and leveator; biceps; periscapular musculature; pecs, focus on the deltoid area today - patient supine  PROM and stretching shoulder flexion; ER and cervical ROM patient in supine Scapular mobilization; glenohumeral joint mobs in PA and inferior planes    Treatment                                          DATE: 07/19/2023 Therapeutic Exercise: Pulley  Flexion (PT holding upper trap to decrease scapular elevation last few reps) Horizontal ab/adduction with pulley scap squeeze  Standing  Corner stretch low, mid, high range position 10-20 sec x 2  Doorway stretch 30 sec low middle high x 2  Chest lift/scap squeeze  Shoulder horizontal abduction stretch at wall 20 sec x 3  Shoulder horizontal abduction at wall elbow at 90 deg 20 sec x 3  Ball on wall at dorsum of hand working on ER in abduction 90/90 position ~ 1-2 min  Supine  Hands behind head for stretch into ER  Manual Therapy: Soft tissue mobilization and trigger point work through L cervical musculature and shoulder girdle including upper trap and leveator;  biceps; periscapular musculature; pecs - patient supine  PROM and stretching shoulder flexion; ER and cervical ROM patient in supine Scapular mobilization; glenohumeral joint mobs in PA and  inferior planes   TTreatment                                          DATE: 07/12/2023 Therapeutic Exercise: Pulley  Flexion (PT holding upper trap to decrease scapular elevation last few reps) Horizontal ab/adduction with pulley scap squeeze  Standing  Biceps stretch at table 30 sec x 3  Corner stretch low, mid, high range position 10-20 sec x 2  Doorway stretch 30 sec low middle high x 2  Chest lift/scap squeeze  Shoulder horizontal abduction stretch at wall 20 sec x 3  Supine  Hands behind head for stretch into ER  Neuromuscular re-education: (HEP)  Patient working in standing - improve smooth control and improve muscular activation with decreased use of L upper trap with active assistive shoulder elevation. Working to facilitate posterior shoulder girdle musculature   Manual Therapy: Skilled palpation to assess resopnse to dry needling and manual work  Civil engineer, contracting instructions: Expect mild to moderate muscle soreness.  Patient Consent Given: Yes Education handout provided: Previously provided Muscles treated: L upper trap; scaleni; cervical paraspinals  Electrical stimulation performed: No Parameters: N/A Treatment response/outcome: decreased palpable tightness  Soft tissue mobilization and trigger point work through L cervical musculature and shoulder girdle including upper trap and leveator; biceps; periscapular musculature; pecs - patient supine  PROM and stretching shoulder flexion; ER  - cervical ROM patient in supine Scapular mobilization; glenohumeral joint mobs in PA and inferior planes   Modalities:   Moist heat cervical and L shoulder x 15 min   PATIENT EDUCATION: Education details: POC; HEP Person educated: Patient Education method: Programmer, multimedia, Facilities manager, Actor cues, Verbal cues, and Handouts Education comprehension: verbalized understanding, returned demonstration, verbal cues required, tactile cues required, and needs further  education  HOME EXERCISE PROGRAM: (from treatment prior to surgery 03/14/23)  E-mailed to: [email or web address]   Access Code: [email or web address] URL: [email or web address] Date: 07/19/2023 Prepared by: Corlis Leak  Exercises - Seated Shoulder Flexion AAROM with Pulley Behind  - 2 x daily - 7 x weekly - 1 sets - 10 reps - 10 sec  hold - Seated Shoulder Scaption AAROM with Pulley at Side  - 2 x daily - 7 x weekly - 1 sets - 10 reps - 10sec  hold - Seated Cervical Retraction  - 2 x daily - 7 x weekly - 1-2 sets - 5-10 reps - 10 sec  hold - Seated Scapular Retraction  - 2 x daily - 7 x weekly - 1-2 sets - 10 reps - 10 sec  hold - Standing Scapular Retraction  - 3 x daily - 7 x weekly - 1 sets - 10 reps - 10 sec  hold - Shoulder External Rotation and Scapular Retraction  - 3 x daily - 7 x weekly - 1 sets - 10 reps - 3-5 sec   hold - Seated Shoulder W  - 2 x daily - 7 x weekly - 1 sets - 10 reps - 3 sec  hold - Shoulder External Rotation and Scapular Retraction with Resistance  - 2 x daily - 7 x weekly - 1 sets - 10 reps - 3-5 sec  hold - Shoulder W - External Rotation with Resistance  -  2 x daily - 7 x weekly - 1-2 sets - 10 reps - 3 sec  hold - Standing Bilateral Low Shoulder Row with Anchored Resistance  - 2 x daily - 7 x weekly - 1-3 sets - 10 reps - 2-3 sec  hold - Drawing Bow  - 1 x daily - 7 x weekly - 1 sets - 10 reps - 3 sec  hold - Prone Scapular Retraction  - 2 x daily - 7 x weekly - 1 sets - 5-10 reps - 3-5 sec  hold - Cat Cow  - 2 x daily - 7 x weekly - 1 sets - 5-10 reps - 3-5 sec  hold - Cat Cow to Child's Pose  - 2 x daily - 7 x weekly - 1 sets - 3 reps - 20-30 sec  hold - Shoulder Flexion Serratus Activation with Resistance  - 1 x daily - 7 x weekly - 1 sets - 10 reps - 3-5 sec  hold - Wall Clock with Theraband  - 1 x daily - 7 x weekly - 1 sets - 10 reps - 2-3 sec  hold - Isometric Shoulder External Rotation in Abduction with Ball at Wall  - 1 x daily - 7 x weekly - 1-2 min  hold - Single Arm Doorway Pec Stretch at 90 Degrees Abduction  - 2 x daily - 7 x weekly - 1 sets - 3 reps - 30 sec  hold  ASSESSMENT:  CLINICAL IMPRESSION: Patient continues to progress with increasing ROM, strength, and functional activities. She is sleeping better and and having less pain. She has persistent  discomfort in the L deltoid insertion area with isometric resistance. No pain with active movement and good strength with resistive testing L UE. She has minimal tenderness to palpation in that area. There is continued tightness in the L cervical and shoulder girdle musculature. Progressing well toward long term goals of therapy. She will benefit from continued treatment to reach maximum rehab potential.   04/21/23: ESI went well 04/20/23 and she can see improvement in the L UE symptoms. Manual work decreases muscular tightness and patient does tolerated PROM/stretching better. She continues to have tight but empty end feel. Note some improving muscular tightness through L shoulder girdle with persistent tightness in [facility] joint with decreased mobility and stability in the L scapulothoracic range. Patient is working on her exercises and ROM at home.   OBJECTIVE IMPAIRMENTS: Lt shoulder pain which started with lifting overhead her luggage when travelling. She has had gradually increasing pain and stiffness in the Lt shoulder. She received an injection ~ 1-2 weeks ago with some improvement. Patient presents with poor posture and alignment; limited A and PROM Lt shoulder; functional weakness due to pain; decreased functional activities and pain on a daily basis    GOALS: Goals reviewed with patient? Yes  SHORT TERM GOALS: Target date: 04/26/2023  Independent in initial HEP  Baseline: Goal status: met  2.  Increase AROM shoulder flexion and abduction to 100-110 degrees  Baseline:  Goal status: met   LONG TERM GOALS: Target date: 08/30/2023  Decrease pain in the Lt shoulder by 75-100%  allowing patient to return to normal functional activities  Baseline: pain ~ 75% improved Goal status: met   2.  AROM Lt shoulder WFL's and pain free  Baseline:  Goal status: on going  3.  Patient reports return to normal functional activities including lifting grandson with minimal to no increase in Lt shoulder pain  Baseline:  Goal status: on going   4.  Independent in HEP  Baseline:  Goal status: on going   PLAN:  PT FREQUENCY: 1-2x/week  PT DURATION: 10 weeks  PLANNED INTERVENTIONS: Therapeutic exercises, Therapeutic activity, Neuromuscular re-education, Patient/Family education, Self Care, Joint mobilization, Aquatic Therapy, Dry Needling, Electrical stimulation, Spinal mobilization, Cryotherapy, Moist heat, Taping, Vasopneumatic device, Ultrasound, Ionotophoresis 4mg /ml Dexamethasone, Manual therapy, and Re-evaluation  PLAN FOR NEXT SESSION: review and progress exercises; continue with postural correction and education; manual work, DN, modalities as indicated    [employer], PT 08/02/2023, 1:15 PM

## 2023-08-09 ENCOUNTER — Ambulatory Visit: Payer: BC Managed Care – PPO | Admitting: Rehabilitative and Restorative Service Providers"

## 2023-08-10 DIAGNOSIS — Z01419 Encounter for gynecological examination (general) (routine) without abnormal findings: Secondary | ICD-10-CM | POA: Diagnosis not present

## 2023-08-10 DIAGNOSIS — Z6824 Body mass index (BMI) 24.0-24.9, adult: Secondary | ICD-10-CM | POA: Diagnosis not present

## 2023-08-16 ENCOUNTER — Encounter: Payer: BC Managed Care – PPO | Admitting: Rehabilitative and Restorative Service Providers"

## 2023-08-16 DIAGNOSIS — Z4789 Encounter for other orthopedic aftercare: Secondary | ICD-10-CM | POA: Diagnosis not present

## 2023-08-18 DIAGNOSIS — R1031 Right lower quadrant pain: Secondary | ICD-10-CM | POA: Diagnosis not present

## 2023-08-18 DIAGNOSIS — R102 Pelvic and perineal pain: Secondary | ICD-10-CM | POA: Diagnosis not present

## 2023-09-06 DIAGNOSIS — F329 Major depressive disorder, single episode, unspecified: Secondary | ICD-10-CM | POA: Diagnosis not present

## 2023-09-06 DIAGNOSIS — F4310 Post-traumatic stress disorder, unspecified: Secondary | ICD-10-CM | POA: Diagnosis not present

## 2023-09-08 DIAGNOSIS — F419 Anxiety disorder, unspecified: Secondary | ICD-10-CM | POA: Diagnosis not present

## 2023-09-08 DIAGNOSIS — Z1329 Encounter for screening for other suspected endocrine disorder: Secondary | ICD-10-CM | POA: Diagnosis not present

## 2023-09-08 DIAGNOSIS — R251 Tremor, unspecified: Secondary | ICD-10-CM | POA: Diagnosis not present

## 2023-09-08 DIAGNOSIS — N951 Menopausal and female climacteric states: Secondary | ICD-10-CM | POA: Diagnosis not present

## 2023-09-08 DIAGNOSIS — E161 Other hypoglycemia: Secondary | ICD-10-CM | POA: Diagnosis not present

## 2023-09-08 DIAGNOSIS — R5383 Other fatigue: Secondary | ICD-10-CM | POA: Diagnosis not present

## 2023-09-12 ENCOUNTER — Ambulatory Visit
Admission: RE | Admit: 2023-09-12 | Discharge: 2023-09-12 | Disposition: A | Discharge status: Home / Self Care | Payer: BC Managed Care – PPO | Source: Ambulatory Visit | Attending: Obstetrics and Gynecology | Admitting: Obstetrics and Gynecology

## 2023-09-12 DIAGNOSIS — Z1231 Encounter for screening mammogram for malignant neoplasm of breast: Secondary | ICD-10-CM

## 2023-09-14 DIAGNOSIS — M7502 Adhesive capsulitis of left shoulder: Secondary | ICD-10-CM | POA: Diagnosis not present

## 2023-09-20 DIAGNOSIS — F329 Major depressive disorder, single episode, unspecified: Secondary | ICD-10-CM | POA: Diagnosis not present

## 2023-09-20 DIAGNOSIS — F4310 Post-traumatic stress disorder, unspecified: Secondary | ICD-10-CM | POA: Diagnosis not present

## 2023-09-22 DIAGNOSIS — Z1329 Encounter for screening for other suspected endocrine disorder: Secondary | ICD-10-CM | POA: Diagnosis not present

## 2023-09-22 DIAGNOSIS — F419 Anxiety disorder, unspecified: Secondary | ICD-10-CM | POA: Diagnosis not present

## 2023-09-22 DIAGNOSIS — R5383 Other fatigue: Secondary | ICD-10-CM | POA: Diagnosis not present

## 2023-09-22 DIAGNOSIS — R251 Tremor, unspecified: Secondary | ICD-10-CM | POA: Diagnosis not present

## 2023-10-11 DIAGNOSIS — F329 Major depressive disorder, single episode, unspecified: Secondary | ICD-10-CM | POA: Diagnosis not present

## 2023-10-11 DIAGNOSIS — F4310 Post-traumatic stress disorder, unspecified: Secondary | ICD-10-CM | POA: Diagnosis not present

## 2023-10-14 ENCOUNTER — Emergency Department (HOSPITAL_BASED_OUTPATIENT_CLINIC_OR_DEPARTMENT_OTHER): Payer: BC Managed Care – PPO

## 2023-10-14 ENCOUNTER — Observation Stay (HOSPITAL_BASED_OUTPATIENT_CLINIC_OR_DEPARTMENT_OTHER)
Admission: EM | Admit: 2023-10-14 | Discharge: 2023-10-15 | Disposition: A | Discharge status: Home / Self Care | Payer: BC Managed Care – PPO | Attending: Emergency Medicine | Admitting: Emergency Medicine

## 2023-10-14 ENCOUNTER — Other Ambulatory Visit: Payer: Self-pay

## 2023-10-14 DIAGNOSIS — D259 Leiomyoma of uterus, unspecified: Secondary | ICD-10-CM | POA: Insufficient documentation

## 2023-10-14 DIAGNOSIS — R1031 Right lower quadrant pain: Secondary | ICD-10-CM | POA: Diagnosis not present

## 2023-10-14 DIAGNOSIS — R509 Fever, unspecified: Secondary | ICD-10-CM | POA: Diagnosis not present

## 2023-10-14 DIAGNOSIS — K8012 Calculus of gallbladder with acute and chronic cholecystitis without obstruction: Principal | ICD-10-CM | POA: Insufficient documentation

## 2023-10-14 DIAGNOSIS — K810 Acute cholecystitis: Secondary | ICD-10-CM | POA: Diagnosis not present

## 2023-10-14 DIAGNOSIS — R824 Acetonuria: Secondary | ICD-10-CM | POA: Diagnosis not present

## 2023-10-14 DIAGNOSIS — R1011 Right upper quadrant pain: Secondary | ICD-10-CM | POA: Diagnosis not present

## 2023-10-14 DIAGNOSIS — K8020 Calculus of gallbladder without cholecystitis without obstruction: Secondary | ICD-10-CM | POA: Diagnosis not present

## 2023-10-14 DIAGNOSIS — E162 Hypoglycemia, unspecified: Secondary | ICD-10-CM | POA: Diagnosis not present

## 2023-10-14 DIAGNOSIS — K828 Other specified diseases of gallbladder: Secondary | ICD-10-CM | POA: Diagnosis not present

## 2023-10-14 DIAGNOSIS — K819 Cholecystitis, unspecified: Principal | ICD-10-CM | POA: Diagnosis present

## 2023-10-14 LAB — COMPREHENSIVE METABOLIC PANEL
ALT: 196 U/L — ABNORMAL HIGH (ref 0–44)
AST: 125 U/L — ABNORMAL HIGH (ref 15–41)
Albumin: 4 g/dL (ref 3.5–5.0)
Alkaline Phosphatase: 125 U/L (ref 38–126)
Anion gap: 12 (ref 5–15)
BUN: 14 mg/dL (ref 6–20)
CO2: 20 mmol/L — ABNORMAL LOW (ref 22–32)
Calcium: 8.6 mg/dL — ABNORMAL LOW (ref 8.9–10.3)
Chloride: 100 mmol/L (ref 98–111)
Creatinine, Ser: 0.7 mg/dL (ref 0.44–1.00)
GFR, Estimated: >60 mL/min (ref >60)
Glucose, Bld: 112 mg/dL — ABNORMAL HIGH (ref 70–99)
Potassium: 3.7 mmol/L (ref 3.5–5.1)
Sodium: 132 mmol/L — ABNORMAL LOW (ref 135–145)
Total Bilirubin: 1.3 mg/dL — ABNORMAL HIGH (ref 0.0–1.2)
Total Protein: 7.6 g/dL (ref 6.5–8.1)

## 2023-10-14 LAB — URINALYSIS, ROUTINE W REFLEX MICROSCOPIC
Bilirubin Urine: NEGATIVE
Glucose, UA: NEGATIVE mg/dL
Ketones, ur: >=80 mg/dL — AB
Nitrite: NEGATIVE
Protein, ur: 30 mg/dL — AB
Specific Gravity, Urine: 1.015 (ref 1.005–1.030)
pH: 5.5 (ref 5.0–8.0)

## 2023-10-14 LAB — CBC
HCT: 37.6 % (ref 36.0–46.0)
Hemoglobin: 12.5 g/dL (ref 12.0–15.0)
MCH: 29.9 pg (ref 26.0–34.0)
MCHC: 33.2 g/dL (ref 30.0–36.0)
MCV: 90 fL (ref 80.0–100.0)
Platelets: 212 10*3/uL (ref 150–400)
RBC: 4.18 MIL/uL (ref 3.87–5.11)
RDW: 13.7 % (ref 11.5–15.5)
WBC: 9.6 10*3/uL (ref 4.0–10.5)
nRBC: 0 % (ref 0.0–0.2)

## 2023-10-14 LAB — RESP PANEL BY RT-PCR (RSV, FLU A&B, COVID)  RVPGX2
Influenza A by PCR: NEGATIVE
Influenza B by PCR: NEGATIVE
Resp Syncytial Virus by PCR: NEGATIVE
SARS Coronavirus 2 by RT PCR: NEGATIVE

## 2023-10-14 LAB — PREGNANCY, URINE: Preg Test, Ur: NEGATIVE

## 2023-10-14 LAB — LIPASE, BLOOD: Lipase: 42 U/L (ref 11–51)

## 2023-10-14 LAB — URINALYSIS, MICROSCOPIC (REFLEX)

## 2023-10-14 LAB — GLUCOSE, CAPILLARY: Glucose-Capillary: 92 mg/dL (ref 70–99)

## 2023-10-14 LAB — CBG MONITORING, ED
Glucose-Capillary: 102 mg/dL — ABNORMAL HIGH (ref 70–99)
Glucose-Capillary: 72 mg/dL (ref 70–99)

## 2023-10-14 MED ORDER — SODIUM CHLORIDE 0.9 % IV SOLN
2.0000 g | Freq: Once | INTRAVENOUS | Status: AC
  Administered 2023-10-14: 2 g via INTRAVENOUS
  Filled 2023-10-14: qty 20

## 2023-10-14 MED ORDER — SODIUM CHLORIDE 0.9 % IV BOLUS
1000.0000 mL | Freq: Once | INTRAVENOUS | Status: AC
  Administered 2023-10-14: 1000 mL via INTRAVENOUS

## 2023-10-14 MED ORDER — LACTATED RINGERS IV BOLUS
1000.0000 mL | Freq: Once | INTRAVENOUS | Status: AC
  Administered 2023-10-14: 1000 mL via INTRAVENOUS

## 2023-10-14 MED ORDER — IOHEXOL 300 MG/ML  SOLN
75.0000 mL | Freq: Once | INTRAMUSCULAR | Status: AC | PRN
  Administered 2023-10-14: 75 mL via INTRAVENOUS

## 2023-10-14 MED ORDER — SODIUM CHLORIDE 0.9 % IV BOLUS: 1000.0000 mL | Freq: Once | INTRAVENOUS | Status: DC

## 2023-10-14 MED ORDER — FENTANYL CITRATE PF 50 MCG/ML IJ SOSY
50.0000 ug | PREFILLED_SYRINGE | Freq: Once | INTRAMUSCULAR | Status: AC
  Administered 2023-10-14: 50 ug via INTRAVENOUS
  Filled 2023-10-14: qty 1

## 2023-10-14 NOTE — ED Provider Notes (Signed)
Care was taken over from Dr. Maple Hudson.  Patient presented with some right side abdominal pain.  Her LFTs are mildly elevated.  CT scan was performed which shows cholelithiasis with some questionable wall changes.  Ultrasound was performed which showed cholelithiasis with mild gallbladder wall thickening.  Her white count is normal.  Her temperature is 99.  Her pain seems to be a little bit lower but given the imaging study association with the transaminitis, I am concerned that this does represent cholecystitis.  I spoke with Dr. Andrey Campanile who will admit the patient to [facility] and plan to have cholecystectomy tomorrow morning.   Rolan Bucco, MD 10/14/23 2017

## 2023-10-14 NOTE — ED Triage Notes (Signed)
Pt presents with URI and RLQ abdominal pain x 3. Sent her by urgent care for CT

## 2023-10-14 NOTE — ED Notes (Signed)
ED TO INPATIENT HANDOFF REPORT  ED Nurse Name and Phone #: Myan Suit RN [phone]  S Name/Age/Gender Lori King 58 y.o. female Room/Bed: MH05/MH05  Code Status   Code Status: Not on file  Home/SNF/Other Home Patient oriented to: self, place, time, and situation Is this baseline? Yes   Triage Complete: Triage complete  Chief Complaint Cholecystitis [K81.9]  Triage Note Pt presents with URI and RLQ abdominal pain x 3. Sent her by urgent care for CT   Allergies Allergies  Allergen Reactions   Other Anaphylaxis    ALL TYPES OF FISH   Shellfish Allergy Anaphylaxis    ALL SHELLFISH   Doxycycline Itching   Ciprofloxacin Rash   Penicillins Rash    Level of Care/Admitting Diagnosis ED Disposition     ED Disposition  Admit   Condition  --   Comment  Hospital Area: [facility] [[id number]]  Level of Care: Med-Surg [16]  Interfacility transfer: Yes  May place patient in observation at [facility] or [facility] if equivalent level of care is available:: No  Covid Evaluation: Confirmed COVID Negative  Diagnosis: Cholecystitis [454098]  Admitting Physician: CCS, MD [[id number]]  Attending Physician: CCS, MD [[id number]]          B Medical/Surgery History Past Medical History:  Diagnosis Date   Adhesive capsulitis of right shoulder    Allergic rhinitis, seasonal    Anemia    Anxiety    MDD (major depressive disorder)    Wears contact lenses    Past Surgical History:  Procedure Laterality Date   BREAST BIOPSY Left 2010   x2 per pt benign   CESAREAN SECTION  X2  last one 02-28-2001   W/  BILATERAL TUBAL LIGATION w/ last c/s   COLONOSCOPY  11/01/2017   HYSTEROSCOPY W/ ENDOMETRIAL ABLATION  2019   LUMBAR LAMINECTOMY/DECOMPRESSION MICRODISCECTOMY  2013   L4  --- S1   SHOULDER ARTHROSCOPY Right 08/28/2020   Procedure: ARTHROSCOPY SHOULDER lysis of adhesions and manipulation under anesthesia, extensive debridement;  Surgeon: Yolonda Kida, MD;   Location: [facility];  Service: Orthopedics;  Laterality: Right;     A IV Location/Drains/Wounds Patient Lines/Drains/Airways Status     Active Line/Drains/Airways     Name Placement date Placement time Site Days   Peripheral IV 10/14/23 20 G Left Antecubital 10/14/23  1358  Antecubital  less than 1            Intake/Output Last 24 hours  Intake/Output Summary (Last 24 hours) at 10/14/2023 2053 Last data filed at 10/14/2023 1748 Gross per 24 hour  Intake 2004.73 ml  Output --  Net 2004.73 ml    Labs/Imaging Results for orders placed or performed during the hospital encounter of 10/14/23 (from the past 48 hours)  Lipase, blood     Status: None   Collection Time: 10/14/23  1:33 PM  Result Value Ref Range   Lipase 42 11 - 51 U/L    Comment: Performed at [facility], [address] [place], [place] [address]  Comprehensive metabolic panel     Status: Abnormal   Collection Time: 10/14/23  1:33 PM  Result Value Ref Range   Sodium 132 (L) 135 - 145 mmol/L   Potassium 3.7 3.5 - 5.1 mmol/L   Chloride 100 98 - 111 mmol/L   CO2 20 (L) 22 - 32 mmol/L   Glucose, Bld 112 (H) 70 - 99 mg/dL    Comment: Glucose reference range applies  only to samples taken after fasting for at least 8 hours.   BUN 14 6 - 20 mg/dL   Creatinine, Ser 2.95 0.44 - 1.00 mg/dL   Calcium 8.6 (L) 8.9 - 10.3 mg/dL   Total Protein 7.6 6.5 - 8.1 g/dL   Albumin 4.0 3.5 - 5.0 g/dL   AST 621 (H) 15 - 41 U/L   ALT 196 (H) 0 - 44 U/L   Alkaline Phosphatase 125 38 - 126 U/L   Total Bilirubin 1.3 (H) 0.0 - 1.2 mg/dL   GFR, Estimated >30 >86 mL/min    Comment: (NOTE) Calculated using the CKD-EPI Creatinine Equation (2021)    Anion gap 12 5 - 15    Comment: Performed at [facility], [address] [place], [place] [address]  CBC     Status: None   Collection Time: 10/14/23  1:33 PM  Result Value Ref Range   WBC 9.6 4.0 - 10.5 K/uL   RBC 4.18 3.87 - 5.11 MIL/uL    Hemoglobin 12.5 12.0 - 15.0 g/dL   HCT 96.2 95.2 - 84.1 %   MCV 90.0 80.0 - 100.0 fL   MCH 29.9 26.0 - 34.0 pg   MCHC 33.2 30.0 - 36.0 g/dL   RDW 32.4 40.1 - 02.7 %   Platelets 212 150 - 400 K/uL   nRBC 0.0 0.0 - 0.2 %    Comment: Performed at [facility], [address] [place], [place] [address]  Urinalysis, Routine w reflex microscopic -Urine, Clean Catch     Status: Abnormal   Collection Time: 10/14/23  1:33 PM  Result Value Ref Range   Color, Urine YELLOW YELLOW   APPearance HAZY (A) CLEAR   Specific Gravity, Urine 1.015 1.005 - 1.030   pH 5.5 5.0 - 8.0   Glucose, UA NEGATIVE NEGATIVE mg/dL   Hgb urine dipstick SMALL (A) NEGATIVE   Bilirubin Urine NEGATIVE NEGATIVE   Ketones, ur >=80 (A) NEGATIVE mg/dL   Protein, ur 30 (A) NEGATIVE mg/dL   Nitrite NEGATIVE NEGATIVE   Leukocytes,Ua SMALL (A) NEGATIVE    Comment: Performed at [facility], [address] [place], [place] [address]  Pregnancy, urine     Status: None   Collection Time: 10/14/23  1:33 PM  Result Value Ref Range   Preg Test, Ur NEGATIVE NEGATIVE    Comment:        THE SENSITIVITY OF THIS METHODOLOGY IS >25 mIU/mL. Performed at [facility], [address] [place], [place] [address]   Urinalysis, Microscopic (reflex)     Status: Abnormal   Collection Time: 10/14/23  1:33 PM  Result Value Ref Range   RBC / HPF 6-10 0 - 5 RBC/hpf   WBC, UA 6-10 0 - 5 WBC/hpf   Bacteria, UA FEW (A) NONE SEEN   Squamous Epithelial / HPF 6-10 0 - 5 /HPF   Mucus PRESENT     Comment: Performed at [facility], [address] [place], [place] [address]  Resp panel by RT-PCR (RSV, Flu A&B, Covid) Anterior Nasal Swab     Status: None   Collection Time: 10/14/23  1:37 PM   Specimen: Anterior Nasal Swab  Result Value Ref Range   SARS Coronavirus 2 by RT PCR NEGATIVE NEGATIVE    Comment: (NOTE) SARS-CoV-2 target nucleic acids are NOT DETECTED.  The SARS-CoV-2 RNA is generally  detectable in upper respiratory specimens during the acute  phase of infection. The lowest concentration of SARS-CoV-2 viral copies this assay can detect is 138 copies/mL. A negative result does not preclude SARS-Cov-2 infection and should not be used as the sole basis for treatment or other patient management decisions. A negative result may occur with  improper specimen collection/handling, submission of specimen other than nasopharyngeal swab, presence of viral mutation(s) within the areas targeted by this assay, and inadequate number of viral copies(<138 copies/mL). A negative result must be combined with clinical observations, patient history, and epidemiological information. The expected result is Negative.  Fact Sheet for Patients:  BloggerCourse.com  Fact Sheet for Healthcare Providers:  SeriousBroker.it  This test is no t yet approved or cleared by the [place] FDA and  has been authorized for detection and/or diagnosis of SARS-CoV-2 by FDA under an Emergency Use Authorization (EUA). This EUA will remain  in effect (meaning this test can be used) for the duration of the COVID-19 declaration under Section 564(b)(1) of the Act, 21 U.S.C.section 360bbb-3(b)(1), unless the authorization is terminated  or revoked sooner.       Influenza A by PCR NEGATIVE NEGATIVE   Influenza B by PCR NEGATIVE NEGATIVE    Comment: (NOTE) The Xpert Xpress SARS-CoV-2/FLU/RSV plus assay is intended as an aid in the diagnosis of influenza from Nasopharyngeal swab specimens and should not be used as a sole basis for treatment. Nasal washings and aspirates are unacceptable for Xpert Xpress SARS-CoV-2/FLU/RSV testing.  Fact Sheet for Patients: BloggerCourse.com  Fact Sheet for Healthcare Providers: SeriousBroker.it  This test is not yet approved or cleared by the [place] FDA and has  been authorized for detection and/or diagnosis of SARS-CoV-2 by FDA under an Emergency Use Authorization (EUA). This EUA will remain in effect (meaning this test can be used) for the duration of the COVID-19 declaration under Section 564(b)(1) of the Act, 21 U.S.C. section 360bbb-3(b)(1), unless the authorization is terminated or revoked.     Resp Syncytial Virus by PCR NEGATIVE NEGATIVE    Comment: (NOTE) Fact Sheet for Patients: BloggerCourse.com  Fact Sheet for Healthcare Providers: SeriousBroker.it  This test is not yet approved or cleared by the [place] FDA and has been authorized for detection and/or diagnosis of SARS-CoV-2 by FDA under an Emergency Use Authorization (EUA). This EUA will remain in effect (meaning this test can be used) for the duration of the COVID-19 declaration under Section 564(b)(1) of the Act, 21 U.S.C. section 360bbb-3(b)(1), unless the authorization is terminated or revoked.  Performed at [facility], [address] [place], [place] [address]   CBG monitoring, ED     Status: Abnormal   Collection Time: 10/14/23  2:06 PM  Result Value Ref Range   Glucose-Capillary 102 (H) 70 - 99 mg/dL    Comment: Glucose reference range applies only to samples taken after fasting for at least 8 hours.  CBG monitoring, ED     Status: None   Collection Time: 10/14/23  7:59 PM  Result Value Ref Range   Glucose-Capillary 72 70 - 99 mg/dL    Comment: Glucose reference range applies only to samples taken after fasting for at least 8 hours.   US Abdomen Limited RUQ (LIVER/GB) Result Date: 10/14/2023 CLINICAL DATA:  151471 RUQ pain 151471 RLQ pain x 2 days, abnormal CT of gallbladder today. History of gallstones. EXAM: ULTRASOUND ABDOMEN LIMITED RIGHT UPPER QUADRANT COMPARISON:  CT abdomen pelvis 10/14/2023 FINDINGS: Gallbladder: Calcified gallstone within the gallbladder lumen. Mild gallbladder wall  thickening  the no pericholecystic fluid visualized. No sonographic Murphy sign noted by sonographer. Common bile duct: Diameter: 2 mm Liver: No focal lesion identified. Within normal limits in parenchymal echogenicity. Portal vein is patent on color Doppler imaging with normal direction of blood flow towards the liver. Other: None. IMPRESSION: Cholelithiasis with nonspecific mild gallbladder wall thickening. No pericholecystic fluid and negative sonographic Murphy sign. Correlate clinically. Electronically Signed   By: Tish Frederickson M.D.   On: 10/14/2023 19:41   CT ABDOMEN PELVIS W CONTRAST Result Date: 10/14/2023 CLINICAL DATA:  RLQ abdominal pain EXAM: CT ABDOMEN AND PELVIS WITH CONTRAST TECHNIQUE: Multidetector CT imaging of the abdomen and pelvis was performed using the standard protocol following bolus administration of intravenous contrast. RADIATION DOSE REDUCTION: This exam was performed according to the departmental dose-optimization program which includes automated exposure control, adjustment of the mA and/or kV according to patient size and/or use of iterative reconstruction technique. CONTRAST:  75mL OMNIPAQUE IOHEXOL 300 MG/ML  SOLN COMPARISON:  07/30/2022, 10/06/2022 FINDINGS: Lower chest: Mild bibasilar subsegmental atelectasis. Coronary artery atherosclerosis. Hepatobiliary: Fatty infiltration adjacent to the falciform ligament. No focal liver abnormality. Cholelithiasis. Gallbladder wall appears slightly thickened and may be partially contracted. No biliary dilatation. Pancreas: Unremarkable. No pancreatic ductal dilatation or surrounding inflammatory changes. Spleen: Within normal limits. Adrenals/Urinary Tract: Unremarkable adrenal glands. Kidneys enhance symmetrically without focal lesion, stone, or hydronephrosis. Ureters are nondilated. Urinary bladder appears unremarkable for the degree of distention. Stomach/Bowel: Tiny hiatal hernia. Stomach within normal limits. No dilated loops of  bowel. Appendix not definitively seen on today's study although there are no pericecal inflammatory changes to suggest appendicitis. Moderate volume stool within the colon. Vascular/Lymphatic: Scattered aortoiliac atherosclerotic calcifications without aneurysm. No abdominopelvic lymphadenopathy. Reproductive: Multiple uterine fibroids.  No adnexal mass. Other: No free fluid. No abdominopelvic fluid collection. No pneumoperitoneum. No abdominal wall hernia. Musculoskeletal: No acute or significant osseous findings. Degenerative disc disease of L4-L5. IMPRESSION: 1. No evidence of appendicitis. 2. Cholelithiasis with possible gallbladder wall thickening. Correlate for clinical signs of acute cholecystitis and consider further evaluation with right upper quadrant ultrasound. 3. Moderate volume stool within the colon. 4. Fibroid uterus. 5. Aortic atherosclerosis (ICD10-I70.0). Electronically Signed   By: Duanne Guess D.O.   On: 10/14/2023 17:28    Pending Labs Unresulted Labs (From admission, onward)    None       Vitals/Pain Today's Vitals   10/14/23 1632 10/14/23 1800 10/14/23 1900 10/14/23 1921  BP: 115/66 123/74 111/64   Pulse: 90 87 92   Resp: 18  18   Temp:    99 F (37.2 C)  TempSrc:    Oral  SpO2: 97% 98% 100%   PainSc:        Isolation Precautions No active isolations  Medications Medications  cefTRIAXone (ROCEPHIN) 2 g in sodium chloride 0.9 % 100 mL IVPB (2 g Intravenous New Bag/Given 10/14/23 2025)  sodium chloride 0.9 % bolus 1,000 mL (0 mLs Intravenous Stopped 10/14/23 1544)  iohexol (OMNIPAQUE) 300 MG/ML solution 75 mL (75 mLs Intravenous Contrast Given 10/14/23 1519)  lactated ringers bolus 1,000 mL (0 mLs Intravenous Stopped 10/14/23 1748)    Mobility walks       R Recommendations: See Admitting Provider Note  Report given to:   Additional Notes:

## 2023-10-14 NOTE — ED Provider Notes (Signed)
[place] [facility] Provider Note   CSN: [id number] Arrival date & time: 10/14/23  1324     History  Chief Complaint  Patient presents with   Abdominal Pain   URI    Lori King is a 58 y.o. female.  This is a 58 year old female sent to the urgent care with right lower quadrant abdominal pain.  Reports flulike symptoms for the past 3 days also having right lower quadrant abdominal pain.  No nausea or vomiting.  Last bowel movement was this morning and normal.  Reportedly has history of fibroid uterus, prior C-section and tubal ligation, but no other abdominal surgeries per patient report.   Abdominal Pain URI      Home Medications Prior to Admission medications   Medication Sig Start Date End Date Taking? Authorizing Provider  acarbose (PRECOSE) 100 MG tablet Take 100 mg by mouth 3 (three) times daily with meals.   Yes [provider]  calcium carbonate (OS-CAL - DOSED IN MG OF ELEMENTAL CALCIUM) 1250 (500 Ca) MG tablet Take 1 tablet by mouth daily.   Yes [provider]  cholecalciferol (VITAMIN D3) 25 MCG (1000 UNIT) tablet Take 2,000 Units by mouth daily.   Yes [provider]  DULoxetine (CYMBALTA) 30 MG capsule Take 30 mg by mouth daily.   Yes [provider]  EPINEPHrine (EPIPEN 2-PAK) 0.3 mg/0.3 mL IJ SOAJ injection Inject 0.3 mLs (0.3 mg total) into the muscle once as needed (for severe allergic reaction). CAll 911 immediately if you have to use this medicine 03/20/17  Yes Allyne Gee, Rosezella [place], PA-C  fluticasone [facility]) 50 MCG/ACT nasal spray Place 1 spray into both nostrils daily as needed for allergies.   Yes [provider]  gabapentin (NEURONTIN) 300 MG capsule Take 300 mg by mouth at bedtime.   Yes [provider]  loratadine (CLARITIN) 10 MG tablet Take 10 mg by mouth daily as needed for allergies.   Yes [provider]  LORazepam (ATIVAN) 0.5 MG tablet Take 0.5  mg by mouth every 6 (six) hours as needed for anxiety.   Yes [provider]  magnesium 30 MG tablet Take 60 mg by mouth at bedtime.   Yes [provider]  oxyCODONE (OXY IR/ROXICODONE) 5 MG immediate release tablet Take 1 tablet (5 mg total) by mouth every 6 (six) hours as needed for severe pain (pain score 7-10). 10/15/23  Yes Gaynelle Adu, MD  potassium chloride (MICRO-K) 10 MEQ CR capsule Take 10 mEq by mouth 2 (two) times daily.   Yes [provider]  acetaminophen (TYLENOL) 500 MG tablet Take 500 mg by mouth every 6 (six) hours as needed.    [provider]  triamcinolone ointment (KENALOG) 0.1 % Apply 1 Application topically 2 (two) times daily. 10/17/23   [provider]      Allergies    Other, Shellfish allergy, Doxycycline, Ciprofloxacin, and Penicillins    Review of Systems   Review of Systems  Gastrointestinal:  Positive for abdominal pain.    Physical Exam Updated Vital Signs BP 97/66 (BP Location: Right Arm)   Pulse 71   Temp 97.8 F (36.6 C) (Oral)   Resp 15   SpO2 97%  Physical Exam Nursing note reviewed.  Constitutional:      General: She is not in acute distress.    Appearance: She is not toxic-appearing.  HENT:     Head: Normocephalic.  Cardiovascular:     Rate and  Rhythm: Normal rate and regular rhythm.  Pulmonary:     Effort: Pulmonary effort is normal.     Breath sounds: Normal breath sounds.  Abdominal:     General: Abdomen is flat.     Palpations: Abdomen is soft.     Tenderness: There is abdominal tenderness in the right lower quadrant. There is no guarding or rebound.  Skin:    General: Skin is warm and dry.     Capillary Refill: Capillary refill takes less than 2 seconds.  Neurological:     General: No focal deficit present.     Mental Status: She is alert.     ED Results / Procedures / Treatments   Labs (all labs ordered are listed, but only abnormal results are displayed) Labs Reviewed   COMPREHENSIVE METABOLIC PANEL - Abnormal; Notable for the following components:      Result Value   Sodium 132 (*)    CO2 20 (*)    Glucose, Bld 112 (*)    Calcium 8.6 (*)    AST 125 (*)    ALT 196 (*)    Total Bilirubin 1.3 (*)    All other components within normal limits  URINALYSIS, ROUTINE W REFLEX MICROSCOPIC - Abnormal; Notable for the following components:   APPearance HAZY (*)    Hgb urine dipstick SMALL (*)    Ketones, ur >=80 (*)    Protein, ur 30 (*)    Leukocytes,Ua SMALL (*)    All other components within normal limits  URINALYSIS, MICROSCOPIC (REFLEX) - Abnormal; Notable for the following components:   Bacteria, UA FEW (*)    All other components within normal limits  COMPREHENSIVE METABOLIC PANEL - Abnormal; Notable for the following components:   Glucose, Bld 104 (*)    Calcium 8.2 (*)    Albumin 3.1 (*)    AST 61 (*)    ALT 142 (*)    All other components within normal limits  GLUCOSE, CAPILLARY - Abnormal; Notable for the following components:   Glucose-Capillary 109 (*)    All other components within normal limits  GLUCOSE, CAPILLARY - Abnormal; Notable for the following components:   Glucose-Capillary 119 (*)    All other components within normal limits  GLUCOSE, CAPILLARY - Abnormal; Notable for the following components:   Glucose-Capillary 233 (*)    All other components within normal limits  CBG MONITORING, ED - Abnormal; Notable for the following components:   Glucose-Capillary 102 (*)    All other components within normal limits  RESP PANEL BY RT-PCR (RSV, FLU A&B, COVID)  [id number]  SURGICAL PCR SCREEN  LIPASE, BLOOD  CBC  PREGNANCY, URINE  GLUCOSE, CAPILLARY  GLUCOSE, CAPILLARY  CBG MONITORING, ED  SURGICAL PATHOLOGY    EKG None  Radiology No results found.  Procedures Procedures    Medications Ordered in ED Medications  sodium chloride 0.9 % bolus 1,000 mL (0 mLs Intravenous Stopped 10/14/23 1544)  iohexol (OMNIPAQUE) 300 MG/ML  solution 75 mL (75 mLs Intravenous Contrast Given 10/14/23 1519)  lactated ringers bolus 1,000 mL (0 mLs Intravenous Stopped 10/14/23 1748)  cefTRIAXone (ROCEPHIN) 2 g in sodium chloride 0.9 % 100 mL IVPB (0 g Intravenous Stopped 10/14/23 2111)  ketorolac (TORADOL) 30 MG/ML injection 30 mg (30 mg Intravenous Given 10/15/23 1003)  ondansetron (ZOFRAN) injection 4 mg (4 mg Intravenous Given 10/15/23 0934)  fentaNYL (SUBLIMAZE) injection 50 mcg (50 mcg Intravenous Given 10/14/23 2113)  indocyanine green (IC-GREEN) injection 1.25 mg (1.25 mg Intravenous Given  10/15/23 0747)  cefTRIAXone (ROCEPHIN) 2 g in sodium chloride 0.9 % 100 mL IVPB (0 g Intravenous Stopping previously hung infusion 10/15/23 1031)  sodium chloride 0.9 % with cefTRIAXone (ROCEPHIN) ADS Med (  Override pull for Anesthesia 10/15/23 0830)    ED Course/ Medical Decision Making/ A&P Clinical Course as of 10/19/23 0706  Fri Oct 14, 2023  1410 Comprehensive metabolic panel(!) Mild dehydration.  No kidney injury.  Also mild transaminitis.  Not having right upper quadrant abdominal pain however.  Was told in the past that she has fatty liver disease. [TY]  1410 CBC No leukocytosis to suggest systemic infection. [TY]  1410 Lipase: 42 [TY]  1410 Glucose-Capillary(!): 102 [TY]  Wed Oct 19, 2023  0705 Care signed out to afternoon team; dispo pending CT scan result and re-evaluation. Unclear etiology.  [TY]    Clinical Course User Index [TY] Coral Spikes, DO                                 Medical Decision Making This is a 58 year old female presenting emergency department for right lower quadrant abdominal pain.  She is afebrile nontachycardic hemodynamically stable.  Does have some mild right lower quadrant tenderness.  Reported fevers at home, but none here.  Husband notes that patient was tested for flu/COVID at urgent care prior to arrival and was reportedly negative.  Reviewed urgent care paperwork, did have ketones, but no obvious  UTI.  Offered pain medications, patient declined.  Will give IV fluids and get CT scan to evaluate for appendicitis.  See ED course for further MDM and final disposition.  Amount and/or Complexity of Data Reviewed Labs: ordered. Decision-making details documented in ED Course. Radiology: ordered.  Risk Prescription drug management. Decision regarding hospitalization.         Final Clinical Impression(s) / ED Diagnoses Final diagnoses:  Cholecystitis    Rx / DC Orders ED Discharge Orders          Ordered    Discharge instructions       Comments: See CCS discharge instructions   10/15/23 1056    Increase activity slowly        10/15/23 1056    Call MD for:       Comments: Temperature >101   10/15/23 1056    Call MD for:  persistant nausea and vomiting        10/15/23 1056    Call MD for:  severe uncontrolled pain        10/15/23 1056    Call MD for:  redness, tenderness, or signs of infection (pain, swelling, redness, odor or green/yellow discharge around incision site)        10/15/23 1056    Call MD for:  hives        10/15/23 1056    Call MD for:  persistant dizziness or light-headedness        10/15/23 1056    Diet general        10/15/23 1056    oxyCODONE (OXY IR/ROXICODONE) 5 MG immediate release tablet  Every 6 hours PRN        10/15/23 1056              Coral Spikes, DO 10/19/23 [phone]

## 2023-10-14 NOTE — Anesthesia Preprocedure Evaluation (Addendum)
Anesthesia Evaluation  Patient identified by MRN, date of birth, ID band Patient awake    Reviewed: Allergy & Precautions, NPO status , Patient's Chart, lab work & pertinent test results  Airway Mallampati: II  TM Distance: >3 FB Neck ROM: Full    Dental no notable dental hx. (+) Teeth Intact, Dental Advisory Given   Pulmonary neg pulmonary ROS   Pulmonary exam normal breath sounds clear to auscultation       Cardiovascular (-) hypertension(-) angina (-) Past MI Normal cardiovascular exam Rhythm:Regular Rate:Normal     Neuro/Psych   Anxiety     negative neurological ROS     GI/Hepatic negative GI ROS, Neg liver ROS,,,  Endo/Other    Renal/GU negative Renal ROSLab Results      Component                Value               Date                          K                        3.7                 10/14/2023                CO2                      20 (L)              10/14/2023                BUN                      14                  10/14/2023                CREATININE               0.70                10/14/2023                    Musculoskeletal  (+) Arthritis ,    Abdominal   Peds  Hematology Lab Results      Component                Value               Date                      WBC                      9.6                 10/14/2023                HGB                      12.5                10/14/2023                HCT  37.6                10/14/2023                MCV                      90.0                10/14/2023                PLT                      212                 10/14/2023              Anesthesia Other Findings All: Doxycyline, cipro, PCn  Reproductive/Obstetrics                             Anesthesia Physical Anesthesia Plan  ASA: 1  Anesthesia Plan: General   Post-op Pain Management: Tylenol PO (pre-op)*   Induction:  Intravenous  PONV Risk Score and Plan: 4 or greater and Treatment may vary due to age or medical condition, Midazolam, Ondansetron and Dexamethasone  Airway Management Planned: Oral ETT  Additional Equipment: None  Intra-op Plan:   Post-operative Plan: Extubation in OR  Informed Consent: I have reviewed the patients History and Physical, chart, labs and discussed the procedure including the risks, benefits and alternatives for the proposed anesthesia with the patient or authorized representative who has indicated his/her understanding and acceptance.     Dental advisory given  Plan Discussed with: CRNA and Surgeon  Anesthesia Plan Comments:         Anesthesia Quick Evaluation

## 2023-10-14 NOTE — ED Notes (Signed)
[facility] called for transport.

## 2023-10-15 ENCOUNTER — Encounter (HOSPITAL_COMMUNITY)
Admission: EM | Disposition: A | Discharge status: Home / Self Care | Payer: Self-pay | Source: Home / Self Care | Attending: Emergency Medicine

## 2023-10-15 ENCOUNTER — Observation Stay (HOSPITAL_COMMUNITY): Payer: BC Managed Care – PPO | Admitting: Anesthesiology

## 2023-10-15 DIAGNOSIS — R945 Abnormal results of liver function studies: Secondary | ICD-10-CM | POA: Diagnosis not present

## 2023-10-15 DIAGNOSIS — K8010 Calculus of gallbladder with chronic cholecystitis without obstruction: Secondary | ICD-10-CM | POA: Diagnosis not present

## 2023-10-15 DIAGNOSIS — K819 Cholecystitis, unspecified: Secondary | ICD-10-CM | POA: Diagnosis not present

## 2023-10-15 DIAGNOSIS — R1011 Right upper quadrant pain: Secondary | ICD-10-CM | POA: Diagnosis not present

## 2023-10-15 DIAGNOSIS — E162 Hypoglycemia, unspecified: Secondary | ICD-10-CM | POA: Diagnosis not present

## 2023-10-15 DIAGNOSIS — D259 Leiomyoma of uterus, unspecified: Secondary | ICD-10-CM | POA: Diagnosis not present

## 2023-10-15 DIAGNOSIS — K8012 Calculus of gallbladder with acute and chronic cholecystitis without obstruction: Secondary | ICD-10-CM | POA: Diagnosis not present

## 2023-10-15 HISTORY — PX: CHOLECYSTECTOMY: SHX55

## 2023-10-15 LAB — COMPREHENSIVE METABOLIC PANEL
ALT: 142 U/L — ABNORMAL HIGH (ref 0–44)
AST: 61 U/L — ABNORMAL HIGH (ref 15–41)
Albumin: 3.1 g/dL — ABNORMAL LOW (ref 3.5–5.0)
Alkaline Phosphatase: 95 U/L (ref 38–126)
Anion gap: 7 (ref 5–15)
BUN: 9 mg/dL (ref 6–20)
CO2: 24 mmol/L (ref 22–32)
Calcium: 8.2 mg/dL — ABNORMAL LOW (ref 8.9–10.3)
Chloride: 106 mmol/L (ref 98–111)
Creatinine, Ser: 0.59 mg/dL (ref 0.44–1.00)
GFR, Estimated: >60 mL/min (ref >60)
Glucose, Bld: 104 mg/dL — ABNORMAL HIGH (ref 70–99)
Potassium: 3.7 mmol/L (ref 3.5–5.1)
Sodium: 137 mmol/L (ref 135–145)
Total Bilirubin: 0.5 mg/dL (ref 0.0–1.2)
Total Protein: 6.5 g/dL (ref 6.5–8.1)

## 2023-10-15 LAB — GLUCOSE, CAPILLARY
Glucose-Capillary: 109 mg/dL — ABNORMAL HIGH (ref 70–99)
Glucose-Capillary: 91 mg/dL (ref 70–99)
Glucose-Capillary: 119 mg/dL — ABNORMAL HIGH (ref 70–99)
Glucose-Capillary: 233 mg/dL — ABNORMAL HIGH (ref 70–99)

## 2023-10-15 LAB — SURGICAL PCR SCREEN
MRSA, PCR: NEGATIVE
Staphylococcus aureus: NEGATIVE

## 2023-10-15 SURGERY — LAPAROSCOPIC CHOLECYSTECTOMY WITH INTRAOPERATIVE CHOLANGIOGRAM
Anesthesia: General | Site: Abdomen

## 2023-10-15 MED ORDER — DIPHENHYDRAMINE HCL 50 MG/ML IJ SOLN
12.5000 mg | Freq: Four times a day (QID) | INTRAMUSCULAR | Status: DC | PRN
  Administered 2023-10-15: 12.5 mg via INTRAVENOUS
  Filled 2023-10-15: qty 1

## 2023-10-15 MED ORDER — LACTATED RINGERS IV SOLN
INTRAVENOUS | Status: DC | PRN
Start: 2023-10-15 — End: 2023-10-15

## 2023-10-15 MED ORDER — MELATONIN 3 MG PO TABS: 3.0000 mg | ORAL_TABLET | Freq: Every evening | ORAL | Status: DC | PRN

## 2023-10-15 MED ORDER — LACTATED RINGERS IR SOLN
Status: DC | PRN
  Administered 2023-10-15: 1000 mL

## 2023-10-15 MED ORDER — SUGAMMADEX SODIUM 200 MG/2ML IV SOLN
INTRAVENOUS | Status: DC | PRN
  Administered 2023-10-15: 200 mg via INTRAVENOUS

## 2023-10-15 MED ORDER — FENTANYL CITRATE (PF) 100 MCG/2ML IJ SOLN
INTRAMUSCULAR | Status: AC
  Filled 2023-10-15: qty 2

## 2023-10-15 MED ORDER — ONDANSETRON 4 MG PO TBDP: 4.0000 mg | ORAL_TABLET | Freq: Four times a day (QID) | ORAL | Status: DC | PRN

## 2023-10-15 MED ORDER — INDOCYANINE GREEN 25 MG IV SOLR
1.2500 mg | INTRAVENOUS | Status: AC
  Administered 2023-10-15: 1.25 mg via INTRAVENOUS
  Filled 2023-10-15: qty 10

## 2023-10-15 MED ORDER — ENOXAPARIN SODIUM 40 MG/0.4ML IJ SOSY: 40.0000 mg | PREFILLED_SYRINGE | INTRAMUSCULAR | Status: DC

## 2023-10-15 MED ORDER — ROCURONIUM BROMIDE 10 MG/ML (PF) SYRINGE
PREFILLED_SYRINGE | INTRAVENOUS | Status: AC
  Filled 2023-10-15: qty 10

## 2023-10-15 MED ORDER — DEXAMETHASONE SODIUM PHOSPHATE 10 MG/ML IJ SOLN
INTRAMUSCULAR | Status: DC | PRN
  Administered 2023-10-15: 6 mg via INTRAVENOUS

## 2023-10-15 MED ORDER — DEXMEDETOMIDINE HCL IN NACL 80 MCG/20ML IV SOLN
INTRAVENOUS | Status: DC | PRN
  Administered 2023-10-15: 8 ug via INTRAVENOUS

## 2023-10-15 MED ORDER — LIDOCAINE HCL (PF) 2 % IJ SOLN
INTRAMUSCULAR | Status: AC
  Filled 2023-10-15: qty 5

## 2023-10-15 MED ORDER — PROPOFOL 10 MG/ML IV BOLUS
INTRAVENOUS | Status: DC | PRN
  Administered 2023-10-15: 140 mg via INTRAVENOUS

## 2023-10-15 MED ORDER — MORPHINE SULFATE (PF) 2 MG/ML IV SOLN: 1.0000 mg | INTRAVENOUS | Status: DC | PRN

## 2023-10-15 MED ORDER — INSULIN ASPART 100 UNIT/ML IJ SOLN: 0.0000 [IU] | INTRAMUSCULAR | Status: DC

## 2023-10-15 MED ORDER — OXYCODONE HCL 5 MG PO TABS: 5.0000 mg | ORAL_TABLET | ORAL | Status: DC | PRN

## 2023-10-15 MED ORDER — SODIUM CHLORIDE 0.9 % IV SOLN
2.0000 g | Freq: Once | INTRAVENOUS | Status: AC
  Administered 2023-10-15: 2 g via INTRAVENOUS

## 2023-10-15 MED ORDER — DEXAMETHASONE SODIUM PHOSPHATE 10 MG/ML IJ SOLN
INTRAMUSCULAR | Status: AC
Start: 2023-10-15 — End: ?
  Filled 2023-10-15: qty 1

## 2023-10-15 MED ORDER — OXYCODONE HCL 5 MG PO TABS: 5.0000 mg | ORAL_TABLET | Freq: Once | ORAL | Status: DC | PRN

## 2023-10-15 MED ORDER — MIDAZOLAM HCL 2 MG/2ML IJ SOLN
INTRAMUSCULAR | Status: AC
  Filled 2023-10-15: qty 2

## 2023-10-15 MED ORDER — PROPOFOL 10 MG/ML IV BOLUS
INTRAVENOUS | Status: AC
  Filled 2023-10-15: qty 20

## 2023-10-15 MED ORDER — ONDANSETRON HCL 4 MG/2ML IJ SOLN
INTRAMUSCULAR | Status: AC
  Filled 2023-10-15: qty 2

## 2023-10-15 MED ORDER — ONDANSETRON HCL 4 MG/2ML IJ SOLN
INTRAMUSCULAR | Status: DC | PRN
  Administered 2023-10-15: 4 mg via INTRAVENOUS

## 2023-10-15 MED ORDER — ACETAMINOPHEN 500 MG PO TABS
1000.0000 mg | ORAL_TABLET | Freq: Four times a day (QID) | ORAL | Status: DC
  Administered 2023-10-15 (×3): 1000 mg via ORAL
  Filled 2023-10-15 (×2): qty 2

## 2023-10-15 MED ORDER — LIDOCAINE HCL (CARDIAC) PF 100 MG/5ML IV SOSY
PREFILLED_SYRINGE | INTRAVENOUS | Status: DC | PRN
  Administered 2023-10-15: 100 mg via INTRATRACHEAL

## 2023-10-15 MED ORDER — DEXMEDETOMIDINE HCL IN NACL 80 MCG/20ML IV SOLN
INTRAVENOUS | Status: AC
  Filled 2023-10-15: qty 20

## 2023-10-15 MED ORDER — HYDROMORPHONE HCL 1 MG/ML IJ SOLN
INTRAMUSCULAR | Status: AC
  Filled 2023-10-15: qty 1

## 2023-10-15 MED ORDER — BUPIVACAINE-EPINEPHRINE 0.25% -1:200000 IJ SOLN
INTRAMUSCULAR | Status: AC
  Filled 2023-10-15: qty 1

## 2023-10-15 MED ORDER — PHENYLEPHRINE 80 MCG/ML (10ML) SYRINGE FOR IV PUSH (FOR BLOOD PRESSURE SUPPORT)
PREFILLED_SYRINGE | INTRAVENOUS | Status: DC | PRN
Start: 2023-10-15 — End: 2023-10-15
  Administered 2023-10-15 (×2): 80 ug via INTRAVENOUS
  Administered 2023-10-15: 160 ug via INTRAVENOUS

## 2023-10-15 MED ORDER — KETOROLAC TROMETHAMINE 30 MG/ML IJ SOLN
30.0000 mg | Freq: Once | INTRAMUSCULAR | Status: AC | PRN
  Administered 2023-10-15: 30 mg via INTRAVENOUS

## 2023-10-15 MED ORDER — DIPHENHYDRAMINE HCL 12.5 MG/5ML PO ELIX: 12.5000 mg | ORAL_SOLUTION | Freq: Four times a day (QID) | ORAL | Status: DC | PRN

## 2023-10-15 MED ORDER — OXYCODONE HCL 5 MG PO TABS: 5.0000 mg | ORAL_TABLET | Freq: Four times a day (QID) | ORAL | 0 refills | Status: DC | PRN

## 2023-10-15 MED ORDER — FENTANYL CITRATE (PF) 100 MCG/2ML IJ SOLN
INTRAMUSCULAR | Status: DC | PRN
  Administered 2023-10-15 (×3): 50 ug via INTRAVENOUS

## 2023-10-15 MED ORDER — KETOROLAC TROMETHAMINE 30 MG/ML IJ SOLN
INTRAMUSCULAR | Status: AC
  Filled 2023-10-15: qty 1

## 2023-10-15 MED ORDER — ACETAMINOPHEN 500 MG PO TABS
ORAL_TABLET | ORAL | Status: AC
  Filled 2023-10-15: qty 2

## 2023-10-15 MED ORDER — GABAPENTIN 100 MG PO CAPS
300.0000 mg | ORAL_CAPSULE | Freq: Every day | ORAL | Status: DC
  Administered 2023-10-15: 300 mg via ORAL
  Filled 2023-10-15: qty 3

## 2023-10-15 MED ORDER — BUPIVACAINE-EPINEPHRINE 0.25% -1:200000 IJ SOLN
INTRAMUSCULAR | Status: DC | PRN
  Administered 2023-10-15: 20 mL

## 2023-10-15 MED ORDER — SIMETHICONE 80 MG PO CHEW: 40.0000 mg | CHEWABLE_TABLET | Freq: Four times a day (QID) | ORAL | Status: DC | PRN

## 2023-10-15 MED ORDER — PHENYLEPHRINE 80 MCG/ML (10ML) SYRINGE FOR IV PUSH (FOR BLOOD PRESSURE SUPPORT)
PREFILLED_SYRINGE | INTRAVENOUS | Status: AC
  Filled 2023-10-15: qty 10

## 2023-10-15 MED ORDER — HYDROMORPHONE HCL 1 MG/ML IJ SOLN
0.2500 mg | INTRAMUSCULAR | Status: DC | PRN
  Administered 2023-10-15: 0.5 mg via INTRAVENOUS

## 2023-10-15 MED ORDER — ONDANSETRON HCL 4 MG/2ML IJ SOLN: 4.0000 mg | Freq: Four times a day (QID) | INTRAMUSCULAR | Status: DC | PRN

## 2023-10-15 MED ORDER — SODIUM CHLORIDE 0.9 % IV SOLN
INTRAVENOUS | Status: AC
  Filled 2023-10-15: qty 20

## 2023-10-15 MED ORDER — MIDAZOLAM HCL 2 MG/2ML IJ SOLN
INTRAMUSCULAR | Status: DC | PRN
  Administered 2023-10-15: 2 mg via INTRAVENOUS

## 2023-10-15 MED ORDER — GABAPENTIN 100 MG PO CAPS
300.0000 mg | ORAL_CAPSULE | Freq: Three times a day (TID) | ORAL | Status: DC
Start: 2023-10-15 — End: 2023-10-15

## 2023-10-15 MED ORDER — LORAZEPAM 0.5 MG PO TABS: 0.5000 mg | ORAL_TABLET | Freq: Four times a day (QID) | ORAL | Status: DC | PRN

## 2023-10-15 MED ORDER — PANTOPRAZOLE SODIUM 40 MG IV SOLR
40.0000 mg | Freq: Every day | INTRAVENOUS | Status: DC
  Administered 2023-10-15: 40 mg via INTRAVENOUS
  Filled 2023-10-15: qty 10

## 2023-10-15 MED ORDER — ROCURONIUM BROMIDE 10 MG/ML (PF) SYRINGE
PREFILLED_SYRINGE | INTRAVENOUS | Status: DC | PRN
  Administered 2023-10-15: 20 mg via INTRAVENOUS
  Administered 2023-10-15: 40 mg via INTRAVENOUS

## 2023-10-15 MED ORDER — ONDANSETRON HCL 4 MG/2ML IJ SOLN
4.0000 mg | Freq: Once | INTRAMUSCULAR | Status: AC | PRN
  Administered 2023-10-15: 4 mg via INTRAVENOUS

## 2023-10-15 MED ORDER — KCL IN DEXTROSE-NACL 20-5-0.45 MEQ/L-%-% IV SOLN
INTRAVENOUS | Status: DC
Start: 2023-10-15 — End: 2023-10-15
  Filled 2023-10-15: qty 1000

## 2023-10-15 MED ORDER — OXYCODONE HCL 5 MG/5ML PO SOLN: 5.0000 mg | Freq: Once | ORAL | Status: DC | PRN

## 2023-10-15 SURGICAL SUPPLY — 46 items
APPLICATOR ARISTA FLEXITIP XL (MISCELLANEOUS) IMPLANT
APPLIER CLIP 5 13 M/L LIGAMAX5 (MISCELLANEOUS) ×1
APPLIER CLIP ROT 10 11.4 M/L (STAPLE)
BAG COUNTER SPONGE SURGICOUNT (BAG) IMPLANT
CABLE HIGH FREQUENCY MONO STRZ (ELECTRODE) ×1 IMPLANT
CHLORAPREP W/TINT 26 (MISCELLANEOUS) ×1 IMPLANT
CLIP APPLIE 5 13 M/L LIGAMAX5 (MISCELLANEOUS) IMPLANT
CLIP APPLIE ROT 10 11.4 M/L (STAPLE) IMPLANT
CLIP LIGATING HEMO O LOK GREEN (MISCELLANEOUS) IMPLANT
COVER MAYO STAND XLG (MISCELLANEOUS) IMPLANT
COVER SURGICAL LIGHT HANDLE (MISCELLANEOUS) ×1 IMPLANT
DERMABOND ADVANCED .7 DNX12 (GAUZE/BANDAGES/DRESSINGS) IMPLANT
DRAPE C-ARM 42X120 X-RAY (DRAPES) IMPLANT
DRSG TEGADERM 2-3/8X2-3/4 SM (GAUZE/BANDAGES/DRESSINGS) ×3 IMPLANT
DRSG TEGADERM 4X4.75 (GAUZE/BANDAGES/DRESSINGS) ×1 IMPLANT
ELECT REM PT RETURN 15FT ADLT (MISCELLANEOUS) ×1 IMPLANT
GAUZE SPONGE 2X2 8PLY STRL LF (GAUZE/BANDAGES/DRESSINGS) ×1 IMPLANT
GLOVE BIO SURGEON STRL SZ7.5 (GLOVE) ×1 IMPLANT
GLOVE INDICATOR 8.0 STRL GRN (GLOVE) ×1 IMPLANT
GOWN STRL REUS W/ TWL XL LVL3 (GOWN DISPOSABLE) ×1 IMPLANT
GRASPER SUT TROCAR 14GX15 (MISCELLANEOUS) IMPLANT
HEMOSTAT ARISTA ABSORB 3G PWDR (HEMOSTASIS) IMPLANT
HEMOSTAT SNOW SURGICEL 2X4 (HEMOSTASIS) IMPLANT
IRRIG SUCT STRYKERFLOW 2 WTIP (MISCELLANEOUS) ×1
IRRIGATION SUCT STRKRFLW 2 WTP (MISCELLANEOUS) ×1 IMPLANT
KIT BASIN OR (CUSTOM PROCEDURE TRAY) ×1 IMPLANT
KIT TURNOVER KIT A (KITS) IMPLANT
L-HOOK LAP DISP 36CM (ELECTROSURGICAL)
LHOOK LAP DISP 36CM (ELECTROSURGICAL) IMPLANT
POUCH RETRIEVAL ECOSAC 10 (ENDOMECHANICALS) ×1 IMPLANT
SCISSORS LAP 5X35 DISP (ENDOMECHANICALS) ×1 IMPLANT
SET CHOLANGIOGRAPH MIX (MISCELLANEOUS) IMPLANT
SET TUBE SMOKE EVAC HIGH FLOW (TUBING) ×1 IMPLANT
SLEEVE ADV FIXATION 5X100MM (TROCAR) ×1 IMPLANT
SPIKE FLUID TRANSFER (MISCELLANEOUS) ×1 IMPLANT
STRIP CLOSURE SKIN 1/2X4 (GAUZE/BANDAGES/DRESSINGS) ×1 IMPLANT
SUT MNCRL AB 4-0 PS2 18 (SUTURE) ×1 IMPLANT
SUT VIC AB 0 UR5 27 (SUTURE) IMPLANT
SUT VICRYL 0 TIES 12 18 (SUTURE) IMPLANT
SUT VICRYL 0 UR6 27IN ABS (SUTURE) IMPLANT
TOWEL OR 17X26 10 PK STRL BLUE (TOWEL DISPOSABLE) ×1 IMPLANT
TRAY LAPAROSCOPIC (CUSTOM PROCEDURE TRAY) ×1 IMPLANT
TROCAR ADV FIXATION 12X100MM (TROCAR) IMPLANT
TROCAR ADV FIXATION 5X100MM (TROCAR) ×1 IMPLANT
TROCAR BALLN 12MMX100 BLUNT (TROCAR) IMPLANT
TROCAR XCEL NON-BLD 5MMX100MML (ENDOMECHANICALS) IMPLANT

## 2023-10-15 NOTE — Transfer of Care (Addendum)
Immediate Anesthesia Transfer of Care Note  Patient: Lori King  Procedure(s) Performed: LAPAROSCOPIC CHOLECYSTECTOMY WITH INTRAOPERATIVE CHOLANGIOGRAM AND ICG DYE (Abdomen)  Patient Location: PACU  Anesthesia Type:General  Level of Consciousness: awake and patient cooperative  Airway & Oxygen Therapy: Patient Spontanous Breathing and Patient connected to nasal cannula oxygen  Post-op Assessment: Report given to RN and Post -op Vital signs reviewed and stable  Post vital signs: Reviewed and stable  Last Vitals:  Vitals Value Taken Time  BP 118/70 10/15/23 0925  Temp 36.4 10/15/23   0924  Pulse 92 10/15/23 0927  Resp 20 10/15/23 0927  SpO2 99 % 10/15/23 0927  Vitals shown include unfiled device data.  Last Pain:  Vitals:   10/15/23 0733  TempSrc:   PainSc: 2          Complications: No notable events documented.36.4

## 2023-10-15 NOTE — Op Note (Signed)
Lori King [id number] 23-Oct-1965 10/15/2023  Laparoscopic Cholecystectomy with near infrared fluorescent cholangiography procedure Note  Indications: This patient presents with symptomatic gallbladder disease and will undergo laparoscopic cholecystectomy.  Pre-operative Diagnosis: Calculus of gallbladder with acute cholecystitis, without mention of obstruction  Post-operative Diagnosis: Same  Surgeon: Gaynelle Adu MD FACS  Assistants: none  Anesthesia: General endotracheal anesthesia   Procedure Details  The patient was seen again in the Holding Room. The risks, benefits, complications, treatment options, and expected outcomes were discussed with the patient. The possibilities of reaction to medication, pulmonary aspiration, perforation of viscus, bleeding, recurrent infection, finding a normal gallbladder, the need for additional procedures, failure to diagnose a condition, the possible need to convert to an open procedure, and creating a complication requiring transfusion or operation were discussed with the patient. The likelihood of improving the patient's symptoms with return to their baseline status is good.  The patient and/or family concurred with the proposed plan, giving informed consent. The site of surgery properly noted. The patient was taken to Operating Room, identified as Lori King and the procedure verified as Laparoscopic Cholecystectomy with ICG dye.  A Time Out was held and the above information confirmed. Antibiotic prophylaxis was administered.    ICG dye was administered preoperatively.    General endotracheal anesthesia was then administered and tolerated well. After the induction, the abdomen was prepped with Chloraprep and draped in the sterile fashion. The patient was positioned in the supine position.  Local anesthetic agent was injected into the skin near the umbilicus and an incision made. We dissected down to the abdominal fascia with blunt  dissection.  The fascia was incised vertically and we entered the peritoneal cavity bluntly.  A pursestring suture of 0-Vicryl was placed around the fascial opening.  The Hasson cannula was inserted and secured with the stay suture.  Pneumoperitoneum was then created with CO2 and tolerated well without any adverse changes in the patient's vital signs. An 5-mm port was placed in the subxiphoid position.  Two 5-mm ports were placed in the right upper quadrant. All skin incisions were infiltrated with a local anesthetic agent before making the incision and placing the trocars.   We positioned the patient in reverse Trendelenburg, tilted slightly to the patient's left.  The gallbladder was identified, the fundus grasped and retracted cephalad. Adhesions were lysed bluntly and with the electrocautery where indicated, taking care not to injure any adjacent organs or viscus. The infundibulum was grasped and retracted laterally, exposing the peritoneum overlying the triangle of Calot. This was then divided and exposed in a blunt fashion. A critical view of the cystic duct and cystic artery was obtained.  The cystic duct was clearly identified and bluntly dissected circumferentially.  Utilizing the [employer] camera system near infrared fluorescent activity was visualized in the liver, cystic duct, common hepatic duct and common bile duct and small bowel.  This served as a secondary confirmation of our anatomy.  The cystic duct was then ligated with 3 clips proximally and divided. The cystic artery which had been identified & dissected free was ligated with clips and divided as well.   The gallbladder was dissected from the liver bed in retrograde fashion with the electrocautery. The gallbladder was removed and placed in an Ecco sac.  The gallbladder and Ecco sac were then removed through the umbilical port site. The liver bed was irrigated and inspected.  There had been some bleeding from the gallbladder fossa at the  superior aspect but hemostasis  was ultimately achieved with electrocautery.  Hemostasis was achieved with the electrocautery. Copious irrigation was utilized and was repeatedly aspirated until clear.  I reinspected the area and saw no bleeding but I did elect to place a piece of surgical snow in the gallbladder fossa.  Because the patient had had right sided pain more in her lower abdomen and the appendix was not able to be visualized on CT I inspected the appendix.  The appendix was normal appearance.  There is no sign of inflammation.  It was not thick-walled.  It appeared to be a benign appearing appendix.  The pursestring suture was used to close the umbilical fascia.  Placed an additional interrupted 0 Vicryl at the umbilical fascia using a PMI suture passer with laparoscopic guidance.  We again inspected the right upper quadrant for hemostasis.  The umbilical closure was inspected and there was no air leak and nothing trapped within the closure. Pneumoperitoneum was released as we removed the trocars.  4-0 Monocryl was used to close the skin.   Dermabond was applied. The patient was then extubated and brought to the recovery room in stable condition. Instrument, sponge, and needle counts were correct at closure and at the conclusion of the case.   Findings: Mild Cholecystitis with Cholelithiasis Positive critical view Indocyanine green dye visualized within the liver, cystic duct, gallbladder, common hepatic duct, common bile duct and small bowel Normal-appearing terminal ileum, cecum and appendix  Estimated Blood Loss: Minimal         Drains: none         Specimens: Gallbladder           Complications: None; patient tolerated the procedure well.         Disposition: PACU - hemodynamically stable.         Condition: stable  [name]. Andrey Campanile, MD, FACS General, Bariatric, & Minimally Invasive Surgery [facility],  [facility]

## 2023-10-15 NOTE — Plan of Care (Signed)
   Problem: Education: Goal: Knowledge of General Education information will improve Description Including pain rating scale, medication(s)/side effects and non-pharmacologic comfort measures Outcome: Progressing

## 2023-10-15 NOTE — Anesthesia Procedure Notes (Signed)
Procedure Name: Intubation Date/Time: 10/15/2023 8:08 AM  Performed by: Oletha Cruel, CRNAPre-anesthesia Checklist: Patient identified, Emergency Drugs available, Suction available and Patient being monitored Patient Re-evaluated:Patient Re-evaluated prior to induction Oxygen Delivery Method: Circle system utilized Preoxygenation: Pre-oxygenation with 100% oxygen Induction Type: IV induction Ventilation: Mask ventilation without difficulty Grade View: Grade II Tube type: Oral Tube size: 7.0 mm Number of attempts: 1 Placement Confirmation: ETT inserted through vocal cords under direct vision, positive ETCO2, CO2 detector and breath sounds checked- equal and bilateral Secured at: 21 cm Tube secured with: Tape Dental Injury: Teeth and Oropharynx as per pre-operative assessment  Comments: Atraumatic intubation. Patient's lips and teeth remain in preoperative condition.

## 2023-10-15 NOTE — H&P (Signed)
CC: abdominal pain  Requesting provider: dr Fredderick Phenix  HPI: Lori King is an 58 y.o. female who is here for evaluation abdominal pain.  The patient presented to urgent care complaining of right-sided abdominal pain along with flulike symptoms for the past 3 days.  She had no nausea or vomiting.  Discomfort was more in her right lower quadrant.  She reports similar symptoms in the past but not as intense.  Not radiate.  No dysuria or hematuria.  Last bowel movement was Friday morning.  She had been having bowel movement.  She had testing for upper respiratory viruses including flu and COVID which were negative.  Prior surgeries include a C-section and tubal ligation.  She has a known history of uterine fibroids.  SHe was sent to the emergency room for further evaluation.  She had mildly elevated LFTs.  In the emergency room she underwent a CT scan and an ultrasound with findings concerning for acute calculus cholecystitis and we were contacted  Past Medical History:  Diagnosis Date   Adhesive capsulitis of right shoulder    Allergic rhinitis, seasonal    Anemia    Anxiety    MDD (major depressive disorder)    Wears contact lenses     Past Surgical History:  Procedure Laterality Date   BREAST BIOPSY Left 2010   x2 per pt benign   CESAREAN SECTION  X2  last one 02-28-2001   W/  BILATERAL TUBAL LIGATION w/ last c/s   COLONOSCOPY  11/01/2017   HYSTEROSCOPY W/ ENDOMETRIAL ABLATION  2019   LUMBAR LAMINECTOMY/DECOMPRESSION MICRODISCECTOMY  2013   L4  --- S1   SHOULDER ARTHROSCOPY Right 08/28/2020   Procedure: ARTHROSCOPY SHOULDER lysis of adhesions and manipulation under anesthesia, extensive debridement;  Surgeon: Yolonda Kida, MD;  Location: [facility];  Service: Orthopedics;  Laterality: Right;    Family History  Problem Relation Age of Onset   Breast cancer Mother 59   Breast cancer Sister 65    Social:  reports that she has never smoked. She has never  used smokeless tobacco. She reports that she does not currently use alcohol. She reports that she does not use drugs.  Allergies:  Allergies  Allergen Reactions   Other Anaphylaxis    ALL TYPES OF FISH   Shellfish Allergy Anaphylaxis    ALL SHELLFISH   Doxycycline Itching   Ciprofloxacin Rash   Penicillins Rash    Did it involve swelling of the face/tongue/throat, SOB, or low BP? No Did it involve sudden or severe rash/hives, skin peeling, or any reaction on the inside of your mouth or nose? Yes Did you need to seek medical attention at a hospital or doctor's office? No When did it last happen?  Over 30 years ago.     If all above answers are "NO", may proceed with cephalosporin use.      Medications: I have reviewed the patient's current medications.   ROS - all of the below systems have been reviewed with the patient and positives are indicated with bold text General: chills, fever or night sweats Eyes: blurry vision or double vision ENT: epistaxis or sore throat Allergy/Immunology: itchy/watery eyes or nasal congestion Hematologic/Lymphatic: bleeding problems, blood clots or swollen lymph nodes Endocrine: temperature intolerance or unexpected weight changes Breast: new or changing breast lumps or nipple discharge Resp: cough, shortness of breath, or wheezing CV: chest pain or dyspnea on exertion GI: as per HPI GU: dysuria, trouble voiding, or hematuria MSK: joint  pain or joint stiffness Neuro: TIA or stroke symptoms Derm: pruritus and skin lesion changes Psych: anxiety and depression  PE Blood pressure 97/62, pulse 74, temperature 98.4 F (36.9 C), temperature source Oral, resp. rate 14, SpO2 97%. Constitutional: NAD; conversant; no deformities Eyes: Moist conjunctiva; no lid lag; anicteric; PERRL Neck: Trachea midline; no thyromegaly Lungs: Normal respiratory effort; no tactile fremitus CV: RRR; no palpable thrills; no pitting edema GI: Abd soft, nondistended,  mild right sided tenderness; no palpable hepatosplenomegaly MSK: ; no clubbing/cyanosis Psychiatric: Appropriate affect; alert and oriented x3 Lymphatic: No palpable cervical or axillary lymphadenopathy Skin: No rash, lesions or jaundice  Results for orders placed or performed during the hospital encounter of 10/14/23 (from the past 48 hours)  Lipase, blood     Status: None   Collection Time: 10/14/23  1:33 PM  Result Value Ref Range   Lipase 42 11 - 51 U/L    Comment: Performed at [facility], [address] [place], [place] [address]  Comprehensive metabolic panel     Status: Abnormal   Collection Time: 10/14/23  1:33 PM  Result Value Ref Range   Sodium 132 (L) 135 - 145 mmol/L   Potassium 3.7 3.5 - 5.1 mmol/L   Chloride 100 98 - 111 mmol/L   CO2 20 (L) 22 - 32 mmol/L   Glucose, Bld 112 (H) 70 - 99 mg/dL    Comment: Glucose reference range applies only to samples taken after fasting for at least 8 hours.   BUN 14 6 - 20 mg/dL   Creatinine, Ser 1.91 0.44 - 1.00 mg/dL   Calcium 8.6 (L) 8.9 - 10.3 mg/dL   Total Protein 7.6 6.5 - 8.1 g/dL   Albumin 4.0 3.5 - 5.0 g/dL   AST 478 (H) 15 - 41 U/L   ALT 196 (H) 0 - 44 U/L   Alkaline Phosphatase 125 38 - 126 U/L   Total Bilirubin 1.3 (H) 0.0 - 1.2 mg/dL   GFR, Estimated >29 >56 mL/min    Comment: (NOTE) Calculated using the CKD-EPI Creatinine Equation (2021)    Anion gap 12 5 - 15    Comment: Performed at [facility], [address] [place], [place] [address]  CBC     Status: None   Collection Time: 10/14/23  1:33 PM  Result Value Ref Range   WBC 9.6 4.0 - 10.5 K/uL   RBC 4.18 3.87 - 5.11 MIL/uL   Hemoglobin 12.5 12.0 - 15.0 g/dL   HCT 65.7 84.6 - 96.2 %   MCV 90.0 80.0 - 100.0 fL   MCH 29.9 26.0 - 34.0 pg   MCHC 33.2 30.0 - 36.0 g/dL   RDW 95.2 84.1 - 32.4 %   Platelets 212 150 - 400 K/uL   nRBC 0.0 0.0 - 0.2 %    Comment: Performed at [facility], [address] [place], [place]  [address]  Urinalysis, Routine w reflex microscopic -Urine, Clean Catch     Status: Abnormal   Collection Time: 10/14/23  1:33 PM  Result Value Ref Range   Color, Urine YELLOW YELLOW   APPearance HAZY (A) CLEAR   Specific Gravity, Urine 1.015 1.005 - 1.030   pH 5.5 5.0 - 8.0   Glucose, UA NEGATIVE NEGATIVE mg/dL   Hgb urine dipstick SMALL (A) NEGATIVE   Bilirubin Urine NEGATIVE NEGATIVE   Ketones, ur >=80 (A) NEGATIVE mg/dL   Protein, ur 30 (A) NEGATIVE mg/dL  Nitrite NEGATIVE NEGATIVE   Leukocytes,Ua SMALL (A) NEGATIVE    Comment: Performed at [facility], [address] [place], [place] [address]  Pregnancy, urine     Status: None   Collection Time: 10/14/23  1:33 PM  Result Value Ref Range   Preg Test, Ur NEGATIVE NEGATIVE    Comment:        THE SENSITIVITY OF THIS METHODOLOGY IS >25 mIU/mL. Performed at [facility], [address] [place], [place] [address]   Urinalysis, Microscopic (reflex)     Status: Abnormal   Collection Time: 10/14/23  1:33 PM  Result Value Ref Range   RBC / HPF 6-10 0 - 5 RBC/hpf   WBC, UA 6-10 0 - 5 WBC/hpf   Bacteria, UA FEW (A) NONE SEEN   Squamous Epithelial / HPF 6-10 0 - 5 /HPF   Mucus PRESENT     Comment: Performed at [facility], [address] [place], [place] [address]  Resp panel by RT-PCR (RSV, Flu A&B, Covid) Anterior Nasal Swab     Status: None   Collection Time: 10/14/23  1:37 PM   Specimen: Anterior Nasal Swab  Result Value Ref Range   SARS Coronavirus 2 by RT PCR NEGATIVE NEGATIVE    Comment: (NOTE) SARS-CoV-2 target nucleic acids are NOT DETECTED.  The SARS-CoV-2 RNA is generally detectable in upper respiratory specimens during the acute phase of infection. The lowest concentration of SARS-CoV-2 viral copies this assay can detect is 138 copies/mL. A negative result does not preclude SARS-Cov-2 infection and should not be used as the sole basis for treatment or other patient management  decisions. A negative result may occur with  improper specimen collection/handling, submission of specimen other than nasopharyngeal swab, presence of viral mutation(s) within the areas targeted by this assay, and inadequate number of viral copies(<138 copies/mL). A negative result must be combined with clinical observations, patient history, and epidemiological information. The expected result is Negative.  Fact Sheet for Patients:  BloggerCourse.com  Fact Sheet for Healthcare Providers:  SeriousBroker.it  This test is no t yet approved or cleared by the [place] FDA and  has been authorized for detection and/or diagnosis of SARS-CoV-2 by FDA under an Emergency Use Authorization (EUA). This EUA will remain  in effect (meaning this test can be used) for the duration of the COVID-19 declaration under Section 564(b)(1) of the Act, 21 U.S.C.section 360bbb-3(b)(1), unless the authorization is terminated  or revoked sooner.       Influenza A by PCR NEGATIVE NEGATIVE   Influenza B by PCR NEGATIVE NEGATIVE    Comment: (NOTE) The Xpert Xpress SARS-CoV-2/FLU/RSV plus assay is intended as an aid in the diagnosis of influenza from Nasopharyngeal swab specimens and should not be used as a sole basis for treatment. Nasal washings and aspirates are unacceptable for Xpert Xpress SARS-CoV-2/FLU/RSV testing.  Fact Sheet for Patients: BloggerCourse.com  Fact Sheet for Healthcare Providers: SeriousBroker.it  This test is not yet approved or cleared by the [place] FDA and has been authorized for detection and/or diagnosis of SARS-CoV-2 by FDA under an Emergency Use Authorization (EUA). This EUA will remain in effect (meaning this test can be used) for the duration of the COVID-19 declaration under Section 564(b)(1) of the Act, 21 U.S.C. section 360bbb-3(b)(1), unless the authorization  is terminated or revoked.     Resp Syncytial Virus by PCR NEGATIVE NEGATIVE    Comment: (NOTE) Fact Sheet for Patients: BloggerCourse.com  Fact Sheet for Healthcare Providers: SeriousBroker.it  This test is not yet approved or cleared by the [place] FDA and has been authorized for detection and/or diagnosis of SARS-CoV-2 by FDA under an Emergency Use Authorization (EUA). This EUA will remain in effect (meaning this test can be used) for the duration of the COVID-19 declaration under Section 564(b)(1) of the Act, 21 U.S.C. section 360bbb-3(b)(1), unless the authorization is terminated or revoked.  Performed at [facility], [address] [place], [place] [address]   CBG monitoring, ED     Status: Abnormal   Collection Time: 10/14/23  2:06 PM  Result Value Ref Range   Glucose-Capillary 102 (H) 70 - 99 mg/dL    Comment: Glucose reference range applies only to samples taken after fasting for at least 8 hours.  CBG monitoring, ED     Status: None   Collection Time: 10/14/23  7:59 PM  Result Value Ref Range   Glucose-Capillary 72 70 - 99 mg/dL    Comment: Glucose reference range applies only to samples taken after fasting for at least 8 hours.  Glucose, capillary     Status: None   Collection Time: 10/14/23 11:08 PM  Result Value Ref Range   Glucose-Capillary 92 70 - 99 mg/dL    Comment: Glucose reference range applies only to samples taken after fasting for at least 8 hours.  Surgical pcr screen     Status: None   Collection Time: 10/15/23  1:22 AM   Specimen: Nasal Mucosa; Nasal Swab  Result Value Ref Range   MRSA, PCR NEGATIVE NEGATIVE   Staphylococcus aureus NEGATIVE NEGATIVE    Comment: (NOTE) The Xpert SA Assay (FDA approved for NASAL specimens in patients 41 years of age and older), is one component of a comprehensive surveillance program. It is not intended to diagnose infection nor to guide or  monitor treatment. Performed at [facility], [address] [place], [place] [address]   Glucose, capillary     Status: Abnormal   Collection Time: 10/15/23  3:38 AM  Result Value Ref Range   Glucose-Capillary 109 (H) 70 - 99 mg/dL    Comment: Glucose reference range applies only to samples taken after fasting for at least 8 hours.  Comprehensive metabolic panel     Status: Abnormal   Collection Time: 10/15/23  5:09 AM  Result Value Ref Range   Sodium 137 135 - 145 mmol/L   Potassium 3.7 3.5 - 5.1 mmol/L   Chloride 106 98 - 111 mmol/L   CO2 24 22 - 32 mmol/L   Glucose, Bld 104 (H) 70 - 99 mg/dL    Comment: Glucose reference range applies only to samples taken after fasting for at least 8 hours.   BUN 9 6 - 20 mg/dL   Creatinine, Ser 1.91 0.44 - 1.00 mg/dL   Calcium 8.2 (L) 8.9 - 10.3 mg/dL   Total Protein 6.5 6.5 - 8.1 g/dL   Albumin 3.1 (L) 3.5 - 5.0 g/dL   AST 61 (H) 15 - 41 U/L   ALT 142 (H) 0 - 44 U/L   Alkaline Phosphatase 95 38 - 126 U/L   Total Bilirubin 0.5 0.0 - 1.2 mg/dL   GFR, Estimated >47 >82 mL/min    Comment: (NOTE) Calculated using the CKD-EPI Creatinine Equation (2021)    Anion gap 7 5 - 15    Comment: Performed at [facility], [address], [place], [place] [address]  Glucose,  capillary     Status: None   Collection Time: 10/15/23  7:11 AM  Result Value Ref Range   Glucose-Capillary 91 70 - 99 mg/dL    Comment: Glucose reference range applies only to samples taken after fasting for at least 8 hours.    US Abdomen Limited RUQ (LIVER/GB) Result Date: 10/14/2023 CLINICAL DATA:  151471 RUQ pain 151471 RLQ pain x 2 days, abnormal CT of gallbladder today. History of gallstones. EXAM: ULTRASOUND ABDOMEN LIMITED RIGHT UPPER QUADRANT COMPARISON:  CT abdomen pelvis 10/14/2023 FINDINGS: Gallbladder: Calcified gallstone within the gallbladder lumen. Mild gallbladder wall thickening the no pericholecystic fluid visualized. No  sonographic Murphy sign noted by sonographer. Common bile duct: Diameter: 2 mm Liver: No focal lesion identified. Within normal limits in parenchymal echogenicity. Portal vein is patent on color Doppler imaging with normal direction of blood flow towards the liver. Other: None. IMPRESSION: Cholelithiasis with nonspecific mild gallbladder wall thickening. No pericholecystic fluid and negative sonographic Murphy sign. Correlate clinically. Electronically Signed   By: Tish Frederickson M.D.   On: 10/14/2023 19:41   CT ABDOMEN PELVIS W CONTRAST Result Date: 10/14/2023 CLINICAL DATA:  RLQ abdominal pain EXAM: CT ABDOMEN AND PELVIS WITH CONTRAST TECHNIQUE: Multidetector CT imaging of the abdomen and pelvis was performed using the standard protocol following bolus administration of intravenous contrast. RADIATION DOSE REDUCTION: This exam was performed according to the departmental dose-optimization program which includes automated exposure control, adjustment of the mA and/or kV according to patient size and/or use of iterative reconstruction technique. CONTRAST:  75mL OMNIPAQUE IOHEXOL 300 MG/ML  SOLN COMPARISON:  07/30/2022, 10/06/2022 FINDINGS: Lower chest: Mild bibasilar subsegmental atelectasis. Coronary artery atherosclerosis. Hepatobiliary: Fatty infiltration adjacent to the falciform ligament. No focal liver abnormality. Cholelithiasis. Gallbladder wall appears slightly thickened and may be partially contracted. No biliary dilatation. Pancreas: Unremarkable. No pancreatic ductal dilatation or surrounding inflammatory changes. Spleen: Within normal limits. Adrenals/Urinary Tract: Unremarkable adrenal glands. Kidneys enhance symmetrically without focal lesion, stone, or hydronephrosis. Ureters are nondilated. Urinary bladder appears unremarkable for the degree of distention. Stomach/Bowel: Tiny hiatal hernia. Stomach within normal limits. No dilated loops of bowel. Appendix not definitively seen on today's study  although there are no pericecal inflammatory changes to suggest appendicitis. Moderate volume stool within the colon. Vascular/Lymphatic: Scattered aortoiliac atherosclerotic calcifications without aneurysm. No abdominopelvic lymphadenopathy. Reproductive: Multiple uterine fibroids.  No adnexal mass. Other: No free fluid. No abdominopelvic fluid collection. No pneumoperitoneum. No abdominal wall hernia. Musculoskeletal: No acute or significant osseous findings. Degenerative disc disease of L4-L5. IMPRESSION: 1. No evidence of appendicitis. 2. Cholelithiasis with possible gallbladder wall thickening. Correlate for clinical signs of acute cholecystitis and consider further evaluation with right upper quadrant ultrasound. 3. Moderate volume stool within the colon. 4. Fibroid uterus. 5. Aortic atherosclerosis (ICD10-I70.0). Electronically Signed   By: Duanne Guess D.O.   On: 10/14/2023 17:28    Imaging: Personally reviewed  A/P: Lori King is an 58 y.o. female with  Right-sided abdominal pain Acute calculous cholecystitis Elevated LFTs due to above Uterine fibroids Reactive hypoglycemia  I believe the patient's symptoms are consistent with gallbladder disease.  Although her pain was more in her right lower quadrant she has gallstones and mild wall thickening and a bump in her LFTs all suggestive of gallbladder pathology.  Her appendix was not visualized on CT however there was no periappendiceal/right lower quadrant inflammation.  We discussed gallbladder disease. The patient was given Agricultural engineer. We discussed non-operative and operative management. We discussed the signs &  symptoms of acute cholecystitis  I discussed laparoscopic cholecystectomy with IOC in detail.  The patient was shown diagrams detailing the procedure.  We discussed the risks and benefits of a laparoscopic cholecystectomy including, but not limited to bleeding, infection, injury to surrounding structures such as  the intestine or liver, bile leak, retained gallstones, need to convert to an open procedure, prolonged diarrhea, blood clots such as  DVT, common bile duct injury, anesthesia risks, and possible need for additional procedures.  We discussed the typical post-operative recovery course. I explained that the likelihood of improvement of their symptoms is good.  Will take a look at the appendix during surgery as well   Data reviewed: urgent care note 2/14; ED note 2/14, vitals x 24hrs, labs x24hrs, ruq [place]; ct abd/pelv  [name]. Andrey Campanile, MD, FACS General, Bariatric, & Minimally Invasive Surgery [facility]

## 2023-10-15 NOTE — Discharge Instructions (Signed)

## 2023-10-15 NOTE — Anesthesia Postprocedure Evaluation (Signed)
Anesthesia Post Note  Patient: Lori King  Procedure(s) Performed: LAPAROSCOPIC CHOLECYSTECTOMY WITH INTRAOPERATIVE CHOLANGIOGRAM AND ICG DYE (Abdomen)     Patient location during evaluation: PACU Anesthesia Type: General Level of consciousness: awake and alert Pain management: pain level controlled Vital Signs Assessment: post-procedure vital signs reviewed and stable Respiratory status: spontaneous breathing, nonlabored ventilation, respiratory function stable and patient connected to nasal cannula oxygen Cardiovascular status: blood pressure returned to baseline and stable Postop Assessment: no apparent nausea or vomiting Anesthetic complications: no  No notable events documented.  Last Vitals:  Vitals:   10/15/23 1036 10/15/23 1142  BP: (!) 96/56 106/78  Pulse: 69 64  Resp:    Temp: 36.8 C 36.7 C  SpO2: 94% 94%    Last Pain:  Vitals:   10/15/23 1142  TempSrc: Oral  PainSc:                  Trevor Iha

## 2023-10-16 ENCOUNTER — Encounter (HOSPITAL_COMMUNITY): Payer: Self-pay | Admitting: General Surgery

## 2023-10-16 NOTE — Addendum Note (Signed)
Addendum  created 10/16/23 1546 by Oletha Cruel, CRNA   Intraprocedure Meds edited

## 2023-10-16 NOTE — Discharge Summary (Signed)
Physician Discharge Summary  Lori King XBJ:[id number] DOB: 1966/04/20 DOA: 10/14/2023  PCP: Bernadette Hoit, MD  Admit date: 10/14/2023 Discharge date: 10/15/2023  Recommendations for Outpatient Follow-up:     Follow-up Information     [facility]. Schedule an appointment as soon as possible for a visit in 2 week(s).   Specialty: General Surgery Why: For wound re-check with [facility] Contact information: [address] [place] [address] [phone]                Discharge Diagnoses:  Acute calculus cholecystitis Uterine fibroids Reactive hypoglycemia  Surgical Procedure: Laparoscopic cholecystectomy with near infrared fluorescent cholangiography February 15  Discharge Condition: Good Disposition: Home  Diet recommendation: Regular  There were no vitals filed for this visit.  History of present illness:  The patient presented to urgent care complaining of right-sided abdominal pain along with flulike symptoms for the past 3 days.  She had no nausea or vomiting.  Discomfort was more in her right lower quadrant.  She reports similar symptoms in the past but not as intense.  Not radiate.  No dysuria or hematuria.  Last bowel movement was Friday morning.  She had been having bowel movement.  She had testing for upper respiratory viruses including flu and COVID which were negative.  Prior surgeries include a C-section and tubal ligation.  She has a known history of uterine fibroids.  SHe was sent to the emergency room for further evaluation.  She had mildly elevated LFTs.  In the emergency room she underwent a CT scan and an ultrasound with findings concerning for acute calculus cholecystitis and we were contacted   Hospital Course:  Patient was brought into the hospital for cholecystitis and started on antibiotics.  She was taken to the operating room on a Feb 15th where she underwent laparoscopic cholecystectomy with near infrared  fluorescent cholangiography.  Immediately after surgery after recovering in the recovery room she was started on clear liquids.  Later that afternoon she was doing well.  She was ambulating.  Her vital signs were stable.  Her pain was controlled with oral medication and she was discharged home.  We have discussed discharge instructions prior to surgery.   Discharge Instructions  Discharge Instructions     Call MD for:   Complete by: As directed    Temperature >101   Call MD for:  hives   Complete by: As directed    Call MD for:  persistant dizziness or light-headedness   Complete by: As directed    Call MD for:  persistant nausea and vomiting   Complete by: As directed    Call MD for:  redness, tenderness, or signs of infection (pain, swelling, redness, odor or green/yellow discharge around incision site)   Complete by: As directed    Call MD for:  severe uncontrolled pain   Complete by: As directed    Diet general   Complete by: As directed    Discharge instructions   Complete by: As directed    See [facility] discharge instructions   Increase activity slowly   Complete by: As directed       Allergies as of 10/15/2023       Reactions   Other Anaphylaxis   ALL TYPES OF FISH   Shellfish Allergy Anaphylaxis   ALL SHELLFISH   Doxycycline Itching   Ciprofloxacin Rash   Penicillins Rash   Did it involve swelling of the face/tongue/throat, SOB, or low BP? No  Did it involve sudden or severe rash/hives, skin peeling, or any reaction on the inside of your mouth or nose? Yes Did you need to seek medical attention at a hospital or doctor's office? No When did it last happen?  Over 30 years ago.     If all above answers are "NO", may proceed with cephalosporin use.        Medication List     STOP taking these medications    ondansetron 4 MG disintegrating tablet Commonly known as: Zofran ODT   oxyCODONE-acetaminophen 5-325 MG tablet Commonly known as: PERCOCET/ROXICET        TAKE these medications    acarbose 100 MG tablet Commonly known as: PRECOSE Take 100 mg by mouth 3 (three) times daily with meals.   acetaminophen 500 MG tablet Commonly known as: TYLENOL Take 500 mg by mouth every 6 (six) hours as needed.   buPROPion 300 MG 24 hr tablet Commonly known as: WELLBUTRIN XL Take 300 mg by mouth daily.   calcium carbonate 1250 (500 Ca) MG tablet Commonly known as: OS-CAL - dosed in mg of elemental calcium Take 1 tablet by mouth daily.   cholecalciferol 25 MCG (1000 UNIT) tablet Commonly known as: VITAMIN D3 Take 2,000 Units by mouth daily.   DULoxetine 30 MG capsule Commonly known as: CYMBALTA Take 30 mg by mouth daily.   EPINEPHrine 0.3 mg/0.3 mL Soaj injection Commonly known as: EpiPen 2-Pak Inject 0.3 mLs (0.3 mg total) into the muscle once as needed (for severe allergic reaction). CAll 911 immediately if you have to use this medicine   fluticasone 50 MCG/ACT nasal spray Commonly known as: FLONASE Place 1 spray into both nostrils daily as needed for allergies.   gabapentin 300 MG capsule Commonly known as: NEURONTIN Take 300 mg by mouth at bedtime.   loratadine 10 MG tablet Commonly known as: CLARITIN Take 10 mg by mouth daily as needed for allergies.   LORazepam 0.5 MG tablet Commonly known as: ATIVAN Take 0.5 mg by mouth every 6 (six) hours as needed for anxiety.   magnesium 30 MG tablet Take 60 mg by mouth at bedtime.   oxyCODONE 5 MG immediate release tablet Commonly known as: Oxy IR/ROXICODONE Take 1 tablet (5 mg total) by mouth every 6 (six) hours as needed for severe pain (pain score 7-10).   potassium chloride 10 MEQ CR capsule Commonly known as: MICRO-K Take 10 mEq by mouth 2 (two) times daily.   senna-docusate 8.6-50 MG tablet Commonly known as: Senokot-S Take 1 tablet by mouth at bedtime as needed for moderate constipation or mild constipation.        Follow-up Information     [facility].  Schedule an appointment as soon as possible for a visit in 2 week(s).   Specialty: General Surgery Why: For wound re-check with [facility] Contact information: [address] [place] [address] [phone]                  The results of significant diagnostics from this hospitalization (including imaging, microbiology, ancillary and laboratory) are listed below for reference.    Significant Diagnostic Studies: US Abdomen Limited RUQ (LIVER/GB) Result Date: 10/14/2023 CLINICAL DATA:  151471 RUQ pain 151471 RLQ pain x 2 days, abnormal CT of gallbladder today. History of gallstones. EXAM: ULTRASOUND ABDOMEN LIMITED RIGHT UPPER QUADRANT COMPARISON:  CT abdomen pelvis 10/14/2023 FINDINGS: Gallbladder: Calcified gallstone within the gallbladder lumen. Mild gallbladder wall thickening the no pericholecystic fluid visualized.  No sonographic Murphy sign noted by sonographer. Common bile duct: Diameter: 2 mm Liver: No focal lesion identified. Within normal limits in parenchymal echogenicity. Portal vein is patent on color Doppler imaging with normal direction of blood flow towards the liver. Other: None. IMPRESSION: Cholelithiasis with nonspecific mild gallbladder wall thickening. No pericholecystic fluid and negative sonographic Murphy sign. Correlate clinically. Electronically Signed   By: Tish Frederickson M.D.   On: 10/14/2023 19:41   CT ABDOMEN PELVIS W CONTRAST Result Date: 10/14/2023 CLINICAL DATA:  RLQ abdominal pain EXAM: CT ABDOMEN AND PELVIS WITH CONTRAST TECHNIQUE: Multidetector CT imaging of the abdomen and pelvis was performed using the standard protocol following bolus administration of intravenous contrast. RADIATION DOSE REDUCTION: This exam was performed according to the departmental dose-optimization program which includes automated exposure control, adjustment of the mA and/or kV according to patient size and/or use of iterative reconstruction technique. CONTRAST:   75mL OMNIPAQUE IOHEXOL 300 MG/ML  SOLN COMPARISON:  07/30/2022, 10/06/2022 FINDINGS: Lower chest: Mild bibasilar subsegmental atelectasis. Coronary artery atherosclerosis. Hepatobiliary: Fatty infiltration adjacent to the falciform ligament. No focal liver abnormality. Cholelithiasis. Gallbladder wall appears slightly thickened and may be partially contracted. No biliary dilatation. Pancreas: Unremarkable. No pancreatic ductal dilatation or surrounding inflammatory changes. Spleen: Within normal limits. Adrenals/Urinary Tract: Unremarkable adrenal glands. Kidneys enhance symmetrically without focal lesion, stone, or hydronephrosis. Ureters are nondilated. Urinary bladder appears unremarkable for the degree of distention. Stomach/Bowel: Tiny hiatal hernia. Stomach within normal limits. No dilated loops of bowel. Appendix not definitively seen on today's study although there are no pericecal inflammatory changes to suggest appendicitis. Moderate volume stool within the colon. Vascular/Lymphatic: Scattered aortoiliac atherosclerotic calcifications without aneurysm. No abdominopelvic lymphadenopathy. Reproductive: Multiple uterine fibroids.  No adnexal mass. Other: No free fluid. No abdominopelvic fluid collection. No pneumoperitoneum. No abdominal wall hernia. Musculoskeletal: No acute or significant osseous findings. Degenerative disc disease of L4-L5. IMPRESSION: 1. No evidence of appendicitis. 2. Cholelithiasis with possible gallbladder wall thickening. Correlate for clinical signs of acute cholecystitis and consider further evaluation with right upper quadrant ultrasound. 3. Moderate volume stool within the colon. 4. Fibroid uterus. 5. Aortic atherosclerosis (ICD10-I70.0). Electronically Signed   By: Duanne Guess D.O.   On: 10/14/2023 17:28    Microbiology: Recent Results (from the past 240 hours)  Resp panel by RT-PCR (RSV, Flu A&B, Covid) Anterior Nasal Swab     Status: None   Collection Time: 10/14/23   1:37 PM   Specimen: Anterior Nasal Swab  Result Value Ref Range Status   SARS Coronavirus 2 by RT PCR NEGATIVE NEGATIVE Final    Comment: (NOTE) SARS-CoV-2 target nucleic acids are NOT DETECTED.  The SARS-CoV-2 RNA is generally detectable in upper respiratory specimens during the acute phase of infection. The lowest concentration of SARS-CoV-2 viral copies this assay can detect is 138 copies/mL. A negative result does not preclude SARS-Cov-2 infection and should not be used as the sole basis for treatment or other patient management decisions. A negative result may occur with  improper specimen collection/handling, submission of specimen other than nasopharyngeal swab, presence of viral mutation(s) within the areas targeted by this assay, and inadequate number of viral copies(<138 copies/mL). A negative result must be combined with clinical observations, patient history, and epidemiological information. The expected result is Negative.  Fact Sheet for Patients:  BloggerCourse.com  Fact Sheet for Healthcare Providers:  SeriousBroker.it  This test is no t yet approved or cleared by the [place] FDA and  has been authorized for detection and/or  diagnosis of SARS-CoV-2 by FDA under an Emergency Use Authorization (EUA). This EUA will remain  in effect (meaning this test can be used) for the duration of the COVID-19 declaration under Section 564(b)(1) of the Act, 21 U.S.C.section 360bbb-3(b)(1), unless the authorization is terminated  or revoked sooner.       Influenza A by PCR NEGATIVE NEGATIVE Final   Influenza B by PCR NEGATIVE NEGATIVE Final    Comment: (NOTE) The Xpert Xpress SARS-CoV-2/FLU/RSV plus assay is intended as an aid in the diagnosis of influenza from Nasopharyngeal swab specimens and should not be used as a sole basis for treatment. Nasal washings and aspirates are unacceptable for Xpert Xpress  SARS-CoV-2/FLU/RSV testing.  Fact Sheet for Patients: BloggerCourse.com  Fact Sheet for Healthcare Providers: SeriousBroker.it  This test is not yet approved or cleared by the [place] FDA and has been authorized for detection and/or diagnosis of SARS-CoV-2 by FDA under an Emergency Use Authorization (EUA). This EUA will remain in effect (meaning this test can be used) for the duration of the COVID-19 declaration under Section 564(b)(1) of the Act, 21 U.S.C. section 360bbb-3(b)(1), unless the authorization is terminated or revoked.     Resp Syncytial Virus by PCR NEGATIVE NEGATIVE Final    Comment: (NOTE) Fact Sheet for Patients: BloggerCourse.com  Fact Sheet for Healthcare Providers: SeriousBroker.it  This test is not yet approved or cleared by the [place] FDA and has been authorized for detection and/or diagnosis of SARS-CoV-2 by FDA under an Emergency Use Authorization (EUA). This EUA will remain in effect (meaning this test can be used) for the duration of the COVID-19 declaration under Section 564(b)(1) of the Act, 21 U.S.C. section 360bbb-3(b)(1), unless the authorization is terminated or revoked.  Performed at [facility], [address] [place], [place] [address]   Surgical pcr screen     Status: None   Collection Time: 10/15/23  1:22 AM   Specimen: Nasal Mucosa; Nasal Swab  Result Value Ref Range Status   MRSA, PCR NEGATIVE NEGATIVE Final   Staphylococcus aureus NEGATIVE NEGATIVE Final    Comment: (NOTE) The Xpert SA Assay (FDA approved for NASAL specimens in patients 21 years of age and older), is one component of a comprehensive surveillance program. It is not intended to diagnose infection nor to guide or monitor treatment. Performed at [facility], [address], [place], [place] [address]      Labs: Basic  Metabolic Panel: Recent Labs  Lab 10/14/23 1333 10/15/23 0509  NA 132* 137  K 3.7 3.7  CL 100 106  CO2 20* 24  GLUCOSE 112* 104*  BUN 14 9  CREATININE 0.70 0.59  CALCIUM 8.6* 8.2*   Liver Function Tests: Recent Labs  Lab 10/14/23 1333 10/15/23 0509  AST 125* 61*  ALT 196* 142*  ALKPHOS 125 95  BILITOT 1.3* 0.5  PROT 7.6 6.5  ALBUMIN 4.0 3.1*   Recent Labs  Lab 10/14/23 1333  LIPASE 42   No results for input(s): "AMMONIA" in the last 168 hours. CBC: Recent Labs  Lab 10/14/23 1333  WBC 9.6  HGB 12.5  HCT 37.6  MCV 90.0  PLT 212   Cardiac Enzymes: No results for input(s): "CKTOTAL", "CKMB", "CKMBINDEX", "TROPONINI" in the last 168 hours. BNP: BNP (last 3 results) No results for input(s): "BNP" in the last 8760 hours.  ProBNP (last 3 results) No results for input(s): "PROBNP" in the last 8760 hours.  CBG: Recent Labs  Lab 10/14/23  2308 10/15/23 0338 10/15/23 0711 10/15/23 1144 10/15/23 1610  GLUCAP 92 109* 91 119* 233*    Principal Problem:   Cholecystitis   Time coordinating discharge: 20 min  Signed:  [name]. Andrey Campanile, MD, FACS General, Bariatric, & Minimally Invasive Surgery [facility] Surgery,  A [facility]  10/16/2023, 6:00 PM

## 2023-10-17 DIAGNOSIS — R5082 Postprocedural fever: Secondary | ICD-10-CM | POA: Diagnosis not present

## 2023-10-18 ENCOUNTER — Inpatient Hospital Stay (HOSPITAL_COMMUNITY)
Admission: AD | Admit: 2023-10-18 | Discharge: 2023-10-22 | DRG: 864 | Disposition: A | Discharge status: Home / Self Care | Payer: BC Managed Care – PPO | Source: Ambulatory Visit | Attending: Surgery | Admitting: Surgery

## 2023-10-18 ENCOUNTER — Other Ambulatory Visit: Payer: Self-pay

## 2023-10-18 ENCOUNTER — Telehealth: Payer: Self-pay | Admitting: General Surgery

## 2023-10-18 ENCOUNTER — Encounter (HOSPITAL_COMMUNITY): Payer: Self-pay

## 2023-10-18 DIAGNOSIS — L509 Urticaria, unspecified: Secondary | ICD-10-CM | POA: Diagnosis present

## 2023-10-18 DIAGNOSIS — K760 Fatty (change of) liver, not elsewhere classified: Secondary | ICD-10-CM | POA: Diagnosis present

## 2023-10-18 DIAGNOSIS — Z880 Allergy status to penicillin: Secondary | ICD-10-CM | POA: Diagnosis not present

## 2023-10-18 DIAGNOSIS — R7989 Other specified abnormal findings of blood chemistry: Principal | ICD-10-CM | POA: Diagnosis present

## 2023-10-18 DIAGNOSIS — F419 Anxiety disorder, unspecified: Secondary | ICD-10-CM | POA: Diagnosis not present

## 2023-10-18 DIAGNOSIS — Z9049 Acquired absence of other specified parts of digestive tract: Secondary | ICD-10-CM

## 2023-10-18 DIAGNOSIS — R000 Tachycardia, unspecified: Secondary | ICD-10-CM | POA: Diagnosis present

## 2023-10-18 DIAGNOSIS — Z79899 Other long term (current) drug therapy: Secondary | ICD-10-CM | POA: Diagnosis not present

## 2023-10-18 DIAGNOSIS — R160 Hepatomegaly, not elsewhere classified: Secondary | ICD-10-CM | POA: Diagnosis not present

## 2023-10-18 DIAGNOSIS — R109 Unspecified abdominal pain: Secondary | ICD-10-CM | POA: Diagnosis not present

## 2023-10-18 DIAGNOSIS — J9811 Atelectasis: Secondary | ICD-10-CM | POA: Diagnosis present

## 2023-10-18 DIAGNOSIS — J90 Pleural effusion, not elsewhere classified: Secondary | ICD-10-CM | POA: Diagnosis not present

## 2023-10-18 DIAGNOSIS — R0989 Other specified symptoms and signs involving the circulatory and respiratory systems: Secondary | ICD-10-CM | POA: Diagnosis not present

## 2023-10-18 DIAGNOSIS — R5082 Postprocedural fever: Secondary | ICD-10-CM | POA: Diagnosis not present

## 2023-10-18 DIAGNOSIS — Z888 Allergy status to other drugs, medicaments and biological substances status: Secondary | ICD-10-CM | POA: Diagnosis not present

## 2023-10-18 DIAGNOSIS — J984 Other disorders of lung: Secondary | ICD-10-CM | POA: Diagnosis not present

## 2023-10-18 DIAGNOSIS — R509 Fever, unspecified: Secondary | ICD-10-CM | POA: Diagnosis not present

## 2023-10-18 DIAGNOSIS — R918 Other nonspecific abnormal finding of lung field: Secondary | ICD-10-CM | POA: Diagnosis not present

## 2023-10-18 DIAGNOSIS — Z91013 Allergy to seafood: Secondary | ICD-10-CM | POA: Diagnosis not present

## 2023-10-18 DIAGNOSIS — I2510 Atherosclerotic heart disease of native coronary artery without angina pectoris: Secondary | ICD-10-CM | POA: Diagnosis not present

## 2023-10-18 DIAGNOSIS — K8020 Calculus of gallbladder without cholecystitis without obstruction: Secondary | ICD-10-CM | POA: Diagnosis not present

## 2023-10-18 DIAGNOSIS — I509 Heart failure, unspecified: Secondary | ICD-10-CM | POA: Diagnosis not present

## 2023-10-18 DIAGNOSIS — B379 Candidiasis, unspecified: Secondary | ICD-10-CM | POA: Diagnosis present

## 2023-10-18 DIAGNOSIS — Z881 Allergy status to other antibiotic agents status: Secondary | ICD-10-CM

## 2023-10-18 LAB — SURGICAL PATHOLOGY

## 2023-10-18 MED ORDER — PROCHLORPERAZINE EDISYLATE 10 MG/2ML IJ SOLN: 5.0000 mg | INTRAMUSCULAR | Status: DC | PRN

## 2023-10-18 MED ORDER — TRIAMCINOLONE 0.1 % CREAM:EUCERIN CREAM 1:1
TOPICAL_CREAM | Freq: Two times a day (BID) | CUTANEOUS | Status: DC
  Filled 2023-10-18: qty 1

## 2023-10-18 MED ORDER — TRIAMCINOLONE ACETONIDE 0.1 % EX CREA
TOPICAL_CREAM | Freq: Two times a day (BID) | CUTANEOUS | Status: DC
  Administered 2023-10-20: 1 via TOPICAL
  Filled 2023-10-18 (×3): qty 15

## 2023-10-18 MED ORDER — MORPHINE SULFATE (PF) 2 MG/ML IV SOLN: 2.0000 mg | INTRAVENOUS | Status: DC | PRN

## 2023-10-18 MED ORDER — LORAZEPAM 0.5 MG PO TABS: 0.5000 mg | ORAL_TABLET | Freq: Four times a day (QID) | ORAL | Status: DC | PRN

## 2023-10-18 MED ORDER — GABAPENTIN 100 MG PO CAPS
300.0000 mg | ORAL_CAPSULE | Freq: Every day | ORAL | Status: DC
  Administered 2023-10-18 – 2023-10-21 (×4): 300 mg via ORAL
  Filled 2023-10-18 (×4): qty 3

## 2023-10-18 MED ORDER — SODIUM CHLORIDE 0.9 % IV SOLN
1.0000 g | INTRAVENOUS | Status: AC
  Administered 2023-10-18: 1 g via INTRAVENOUS
  Filled 2023-10-18: qty 20

## 2023-10-18 MED ORDER — ACETAMINOPHEN 325 MG PO TABS
650.0000 mg | ORAL_TABLET | Freq: Four times a day (QID) | ORAL | Status: DC | PRN
  Administered 2023-10-18 – 2023-10-19 (×2): 650 mg via ORAL
  Filled 2023-10-18 (×3): qty 2

## 2023-10-18 MED ORDER — DIPHENHYDRAMINE HCL 50 MG/ML IJ SOLN
12.5000 mg | Freq: Four times a day (QID) | INTRAMUSCULAR | Status: DC | PRN
Start: 2023-10-18 — End: 2023-10-22
  Administered 2023-10-18 – 2023-10-22 (×11): 12.5 mg via INTRAVENOUS
  Filled 2023-10-18 (×11): qty 1

## 2023-10-18 MED ORDER — OXYCODONE HCL 5 MG PO TABS: 5.0000 mg | ORAL_TABLET | ORAL | Status: DC | PRN

## 2023-10-18 MED ORDER — KCL IN DEXTROSE-NACL 10-5-0.45 MEQ/L-%-% IV SOLN
INTRAVENOUS | Status: AC
Start: 2023-10-18 — End: 2023-10-19
  Filled 2023-10-18 (×2): qty 1000

## 2023-10-18 MED ORDER — ONDANSETRON HCL 4 MG PO TABS: 4.0000 mg | ORAL_TABLET | Freq: Four times a day (QID) | ORAL | Status: DC | PRN

## 2023-10-18 MED ORDER — MEROPENEM 1 G IV SOLR
1.0000 g | Freq: Three times a day (TID) | INTRAVENOUS | Status: DC
  Administered 2023-10-19 – 2023-10-20 (×4): 1 g via INTRAVENOUS
  Filled 2023-10-18 (×5): qty 20

## 2023-10-18 NOTE — Progress Notes (Signed)
Patient directly admitted after evaluation in our office. See note from Dr. Andrey Campanile and Dionicio Stall.  Patients cc is persistent fever. She also reports mild abdominal distention despite tolerating PO and having flatus/BMs. Denies urinary sxs.  Reports rash since the day of surgery.   On exam the patient is non-toxic appearing and conversant She does have a raised pink rash over her trunk and extremities Her abdomen is mildly distented, some RUQ tenderness inferior and medial to RUQ port site without guarding. She has some echhymosis of the umbilicus as well as some redness of her skin around the incisions that does extend a few cm outward, but appears more consistent with skin reaction than infection. No drainage   Admit Start abx - will talk to pharmacy about choice of abx given rash and history of PCN allergy.  MRCP to look for possible choledocholithiasis Labs in AM.  No emergent role for surgery today.    Hosie Spangle, PA-C [facility] Please see Amion for pager number during day hours 7:00am-4:30pm

## 2023-10-18 NOTE — Progress Notes (Signed)
Pharmacy Antibiotic Note  Lori King is a 58 y.o. female admitted on 10/18/2023 with suspected post-op intra-abdominal infection after cholecystectomy. Concern for drug rash to peri-operative Ceftriaxone given 10/15/2023. Pharmacy has been consulted for Meropenem dosing.  Plan: Meropenem 1g IV q8h Monitor renal function, cultures as available, clinical course Notified RN, Darl Pikes, to monitor patient closely for reaction     Temp (24hrs), Avg:100.4 F (38 C), Min:99.7 F (37.6 C), Max:101.1 F (38.4 C)  Recent Labs  Lab 10/14/23 1333 10/15/23 0509  WBC 9.6  --   CREATININE 0.70 0.59    CrCl cannot be calculated (Unknown ideal weight.).    Allergies  Allergen Reactions   Other Anaphylaxis    ALL TYPES OF FISH   Shellfish Allergy Anaphylaxis    ALL SHELLFISH   Doxycycline Itching   Ciprofloxacin Rash   Penicillins Rash    Did it involve swelling of the face/tongue/throat, SOB, or low BP? No Did it involve sudden or severe rash/hives, skin peeling, or any reaction on the inside of your mouth or nose? Yes Did you need to seek medical attention at a hospital or doctor's office? No When did it last happen?  Over 30 years ago.     If all above answers are "NO", may proceed with cephalosporin use.       Thank you for allowing pharmacy to be a part of this patient's care.   Greer Pickerel, PharmD, BCPS Clinical Pharmacist 10/18/2023 4:32 PM

## 2023-10-18 NOTE — Progress Notes (Signed)
Pt seen & examined.  Rash as described per PA  Etiology probably med related. Pt got rocephin  Suspect CBD stone given elevated ast/alt/ap. Nml bili last evening Also bile leak but pt not looking like bile leak  Start with MRI/MRCP If fluid on MRI may need HIDA to evaluate for bile leak.  Discussed with pt and husband  [name]. Andrey Campanile, MD, FACS General, Bariatric, & Minimally Invasive Surgery [facility],  A [facility]

## 2023-10-18 NOTE — Telephone Encounter (Signed)
Patient status post laparoscopic cholecystectomy with ICG dye on Saturday.  Husband called in on Sunday stating that his wife had a fever.  No other symptoms.  Instructed them to take Tylenol and call the office on Monday if fever persisted and/or developed additional symptoms.  She saw our PA in urgent office yesterday with persistent fever, a rash on her abdomen and upper extremities.  Labs were ordered.  T. bili normal.  Alkaline phosphatase 338, AST 290, ALT 585.  White count normal.  Hemoglobin hematocrit normal.  Called the husband states that wife is still febrile, has yellow-colored stool, poor appetite and persistent rash.  At this point I told them that she needs to come back to the hospital for admission, labs and imaging to evaluate for potential common bile duct stone.  I called bed control and it looks likewe will direct admit her to the floor.  I called patient husband back and inform him that he should be getting a phone call from bed control hopefully in the next short time.  If cannot get a bed quickly will have to direct patient to emergency room to start her workup.  Patient's husband voiced understanding

## 2023-10-18 NOTE — H&P (Addendum)
PROVIDER:  Reine Just, PA   MRN: [id number] DOB: 03-04-1966 DATE OF ENCOUNTER: 10/18/2023 Interval History:    Lori King is a 58 y.o. female who underwent laparoscopic cholecystectomy on 10/15/2023 by Dr. Andrey Campanile.  Discharge postop day 1.  She is presenting to urgent office with several concerns today.  She states she had a temperature of 103 F yesterday, 10/16/2023.  She took Tylenol and ibuprofen and was able to get the temperature down to 101 F.  She ate well yesterday but has a decreased appetite today.  She denies nausea, vomiting, constipation, diarrhea.  She denies abdominal pain.  She did not take any narcotic pain medication after discharge.   She also states she had a rash on her arms and neck before she left the hospital.  She believes the rash could be from Rocephin.  She has been taking Benadryl which has helped a little bit.  She states there is also redness around her incisions.   She denies obvious shortness of breath or cough.  She states she had a negative flu and COVID test prior to admission for cholecystectomy.  She denies hematuria, urinary frequency, or dysuria.  However, she states that her bladder feels full often and her urine is dark despite drinking plenty of fluids.   Review of Systems:    ROS All other systems reviewed and are negative.   Medications:    Medications Ordered Prior to Encounter        Current Outpatient Medications on File Prior to Visit  Medication Sig Dispense Refill   acarbose (PRECOSE) 100 MG tablet Take 100 mg by mouth       acetaminophen (TYLENOL) 500 MG tablet Take 500 mg by mouth every 6 (six) hours as needed       DULoxetine (CYMBALTA) 30 MG DR capsule Take 30 mg by mouth once daily       gabapentin (NEURONTIN) 300 MG capsule Take 300 mg by mouth at bedtime       potassium chloride (KLOR-CON) 10 MEQ ER tablet Take 1 tablet by mouth 2 (two) times daily        No current facility-administered medications on file prior to  visit.        Physical Examination:    BP 102/70   Pulse 79   Temp 36.8 C (98.2 F)   Ht 158.8 cm (5' 2.5")   Wt 61.8 kg (136 lb 3.2 oz)   BMI 24.51 kg/m  General: Well-developed, well-nourished, in no acute distress.   Eyes: Anicteric Skin: No jaundice Abdomen: Incisions are clean dry and intact with Dermabond in place.  There is some erythema around the umbilical incision and right-sided abdominal incision Upper extremities: There is a red raised bumpy rash on her upper outer arms as well as neck, upper chest, and upper back   Assessment and Plan:    Diagnoses and all orders for this visit:   Status post laparoscopic cholecystectomy   Fever postop -     Complete Blood Count (CBC) with Differential; Future -     CMP14+eGFR - [facility]; Future -     Urinalysis, Complete - [facility]; Future   Rash -     triamcinolone 0.1 % ointment; Apply topically 2 (two) times daily   Other orders -     Microscopic Examination - [facility]     Lori King is status post emergent laparoscopic cholecystectomy on 10/15/2023 by Dr. Andrey Campanile, presenting with a reported temperature of 103 F  yesterday 10/16/23.  She currently has a temperature of 98.3 degrees and is not complaining of much abdominal pain.  We discussed possible postop complication such as bile leak versus CBD stone.  She does not have signs of obvious jaundice today.  Given very little abdominal pain, this does not seem like a bile leak but we will order CBC and CMP to evaluate WBC and LFTs.     She does have some urinary symptoms, although they are not suggestive of obvious UTI.  Will order a urinalysis to rule out UTI.     We discussed starting with triamcinolone ointment for the rash but if she develops any worsening issues, we may need to consider oral prednisone.  She agrees with this plan.   We will contact her with lab results tomorrow.  She will go to the ER if she develops any worsening issues in the meantime.   Return  for appt as planned.   Saunders Glance, [facility]   Addendum: See telephone note from Dr. Gaynelle Adu 10/18/23 - elevated Alkaline phosphatase 338, AST 290, ALT 585.   Still having fevers. pale stools, and persistent rash.  Admission to hospital for further work-up including evaluation of possible CBD stone.

## 2023-10-19 ENCOUNTER — Inpatient Hospital Stay (HOSPITAL_COMMUNITY): Payer: BC Managed Care – PPO

## 2023-10-19 LAB — COMPREHENSIVE METABOLIC PANEL
ALT: 293 U/L — ABNORMAL HIGH (ref 0–44)
AST: 59 U/L — ABNORMAL HIGH (ref 15–41)
Albumin: 2.8 g/dL — ABNORMAL LOW (ref 3.5–5.0)
Alkaline Phosphatase: 317 U/L — ABNORMAL HIGH (ref 38–126)
Anion gap: 11 (ref 5–15)
BUN: 7 mg/dL (ref 6–20)
CO2: 20 mmol/L — ABNORMAL LOW (ref 22–32)
Calcium: 8.2 mg/dL — ABNORMAL LOW (ref 8.9–10.3)
Chloride: 102 mmol/L (ref 98–111)
Creatinine, Ser: 0.44 mg/dL (ref 0.44–1.00)
GFR, Estimated: >60 mL/min (ref >60)
Glucose, Bld: 104 mg/dL — ABNORMAL HIGH (ref 70–99)
Potassium: 3.7 mmol/L (ref 3.5–5.1)
Sodium: 133 mmol/L — ABNORMAL LOW (ref 135–145)
Total Bilirubin: 1 mg/dL (ref 0.0–1.2)
Total Protein: 6.3 g/dL — ABNORMAL LOW (ref 6.5–8.1)

## 2023-10-19 LAB — CBC
HCT: 37.7 % (ref 36.0–46.0)
Hemoglobin: 12.1 g/dL (ref 12.0–15.0)
MCH: 30.3 pg (ref 26.0–34.0)
MCHC: 32.1 g/dL (ref 30.0–36.0)
MCV: 94.5 fL (ref 80.0–100.0)
Platelets: 209 10*3/uL (ref 150–400)
RBC: 3.99 MIL/uL (ref 3.87–5.11)
RDW: 13.6 % (ref 11.5–15.5)
WBC: 5.7 10*3/uL (ref 4.0–10.5)
nRBC: 0 % (ref 0.0–0.2)

## 2023-10-19 LAB — HEPATITIS PANEL, ACUTE
HCV Ab: NONREACTIVE
Hep A IgM: NONREACTIVE
Hep B C IgM: NONREACTIVE
Hepatitis B Surface Ag: NONREACTIVE

## 2023-10-19 LAB — LIPASE, BLOOD: Lipase: 39 U/L (ref 11–51)

## 2023-10-19 MED ORDER — ENOXAPARIN SODIUM 40 MG/0.4ML IJ SOSY
40.0000 mg | PREFILLED_SYRINGE | INTRAMUSCULAR | Status: DC
  Administered 2023-10-19 – 2023-10-21 (×3): 40 mg via SUBCUTANEOUS
  Filled 2023-10-19 (×3): qty 0.4

## 2023-10-19 MED ORDER — TECHNETIUM TC 99M MEBROFENIN IV KIT
5.0000 | PACK | Freq: Once | INTRAVENOUS | Status: AC
  Administered 2023-10-19: 5.47 via INTRAVENOUS

## 2023-10-19 MED ORDER — PREDNISONE 20 MG PO TABS: 20.0000 mg | ORAL_TABLET | Freq: Every day | ORAL | Status: DC

## 2023-10-19 MED ORDER — PREDNISONE 20 MG PO TABS: 40.0000 mg | ORAL_TABLET | Freq: Every day | ORAL | Status: DC

## 2023-10-19 MED ORDER — POLYETHYLENE GLYCOL 3350 17 G PO PACK: 17.0000 g | PACK | Freq: Every day | ORAL | Status: DC | PRN

## 2023-10-19 MED ORDER — PREDNISONE 20 MG PO TABS
60.0000 mg | ORAL_TABLET | Freq: Every day | ORAL | Status: DC
Start: 2023-10-20 — End: 2023-10-22

## 2023-10-19 MED ORDER — GADOBUTROL 1 MMOL/ML IV SOLN
6.0000 mL | Freq: Once | INTRAVENOUS | Status: AC | PRN
  Administered 2023-10-19: 6 mL via INTRAVENOUS

## 2023-10-19 NOTE — Progress Notes (Addendum)
Subjective: CC: Rash, hungry   Patient reports pain only at her incisions with movement that is well controlled. No abdominal pain at rest. She is tolerating liquids without n/v. Hungry and wants to eat. Passing flatus. Last BM 2/18 and dark. No pepto or iron use. Voiding.   Still running fevers to 101.1 at 1400 yesterday. Afebrile this am. No cp. Reports dry cough since surgery. No hemoptysis. Some sob that she describes as needing to take a deep breath. Denies urinary symptoms.   Reports that she recently travelled via cruise to [place]. No diarrheal illnesses. No insect bites. She is not vaccinated against Hep A.   She reports these are the OTC medications she has been on over the last 1 month - she has not restarted these since surgery (see picture below).  Noted pruritic rash to her trunk and extremities (UE > LE). Spares palms and soles. Received Rocephin during last hospitalization. Reports similar rash in the past with pcn, cipro, doxy. Also reports chg bath pre-op that she has not had with prior surgeries. Otherwise denies any changes in lotions/soaps/detergents. Denies contacts with persons with similar rash, or exposure to animal or plant irritants (has not been in the woods etc - no tick bites). Denies swelling or purulent discharge. No new medications except as listed above. Travel as above. Rash is slightly better today per report.      Objective: Vital signs in last 24 hours: Temp:  [98.6 F (37 C)-101.1 F (38.4 C)] 99.6 F (37.6 C) (02/19 0646) Pulse Rate:  [88-101] 97 (02/19 0646) Resp:  [17-20] 19 (02/19 0646) BP: (106-128)/(55-71) 106/55 (02/19 0646) SpO2:  [90 %-98 %] 91 % (02/19 0646) Weight:  [61.8 kg] 61.8 kg (02/18 1715) Last BM Date : 10/17/23  Intake/Output from previous day: 02/18 0701 - 02/19 0700 In: 631.8 [P.O.:150; I.V.:381.8; IV Piggyback:100] Out: 1300 [Urine:1300] Intake/Output this shift: No intake/output data  recorded.  PE: Gen:  Alert, NAD, pleasant HEENT: The patient has normal phonation and is in control of secretions. No stridor.  Midline uvula without edema. No lip swelling. No mucosal sloughing. Mucus membranes moist.  Card:  RRR Pulm:  CTAB, no W/R/R, effort normal Abd: Soft, no distension, mild RUQ ttp and appropriately tender around laparoscopic incisions, no rigidity or guarding and otherwise NT, +BS. Incisions with glue intact. Rash noted. No obvious drainage, bleeding, or signs of infection Ext:  No LE edema or calf tenderness Psych: A&Ox3  Skin: Blanchable erythematous rash that is slightly raised with areas of excoriation as below. Spares palms and soles.  LUE  LUE  RUE  Back  Abdomen  R thigh     Lab Results:  Recent Labs    10/19/23 0515  WBC 5.7  HGB 12.1  HCT 37.7  PLT 209   BMET Recent Labs    10/19/23 0515  NA 133*  K 3.7  CL 102  CO2 20*  GLUCOSE 104*  BUN 7  CREATININE 0.44  CALCIUM 8.2*   PT/INR No results for input(s): "LABPROT", "INR" in the last 72 hours. CMP     Component Value Date/Time   NA 133 (L) 10/19/2023 0515   K 3.7 10/19/2023 0515   CL 102 10/19/2023 0515   CO2 20 (L) 10/19/2023 0515   GLUCOSE 104 (H) 10/19/2023 0515   BUN 7 10/19/2023 0515   CREATININE 0.44 10/19/2023 0515   CALCIUM 8.2 (L) 10/19/2023 0515   PROT  6.3 (L) 10/19/2023 0515   ALBUMIN 2.8 (L) 10/19/2023 0515   AST 59 (H) 10/19/2023 0515   ALT 293 (H) 10/19/2023 0515   ALKPHOS 317 (H) 10/19/2023 0515   BILITOT 1.0 10/19/2023 0515   GFRNONAA >60 10/19/2023 0515   Lipase     Component Value Date/Time   LIPASE 42 10/14/2023 1333    Studies/Results: No results found.  Anti-infectives: Anti-infectives (From admission, onward)    Start     Dose/Rate Route Frequency Ordered Stop   10/19/23 0200  meropenem (MERREM) 1 g in sodium chloride 0.9 % 100 mL IVPB        1 g 200 mL/hr over 30 Minutes Intravenous Every 8 hours 10/18/23 1719     10/18/23 1700   meropenem (MERREM) 1 g in sodium chloride 0.9 % 100 mL IVPB        1 g 200 mL/hr over 30 Minutes Intravenous STAT 10/18/23 1653 10/18/23 1748        Assessment/Plan POD 3 s/p Laparoscopic Cholecystectomy by Dr. Andrey Campanile on 10/14/23 - Readmitted for fevers, elevated LFT's, and rash - Tmax 101.1. No tachycardia. Soft BP of 94/62. WBC wnl. Alk Phos 317 (95), AST 59 (61), ALT 293 (142), T. Bili 1.0.  - MRCP w/ minimal post operative fluid in GB fossa, no biliary ductal dilation or evidence of choledocholithiasis.  - Check HIDA scan to r/o bile leak given fluid in GB fossa on MRI - Check CXR given she does have small b/l pleural effusion and consolidative airspace disease at the bases on MRI - Reviewed home meds and OTC meds w/ pharm to see if these contributing  to elevated LFT's - only collagen and acarbose could be causing elevated LFT's - hold here.  - Check Hepatitis panel given recent travel - Unclear etiology of the rash. Question related to Rocephin or CHG bath peri-op. See hx as above. Cont Triamcinolone cream. Will give system decadron. Cont benadryl prn for itching.   FEN - Reg diet. Cont IVF at 36ml/hr VTE - SCDs, Lovenox ID - Merrem per conversation with pharmacy.     LOS: 1 day    Jacinto Halim, [facility] 10/19/2023, 8:47 AM Please see Amion for pager number during day hours 7:00am-4:30pm

## 2023-10-19 NOTE — Progress Notes (Signed)
   10/19/23 0728  TOC Brief Assessment  Insurance and Status Reviewed  Patient has primary care physician Yes  Home environment has been reviewed resides in private residence with spouse  Prior level of function: Independent  Prior/Current Home Services No current home services  Social Drivers of Health Review SDOH reviewed no interventions necessary  Readmission risk has been reviewed Yes  Transition of care needs no transition of care needs at this time

## 2023-10-20 ENCOUNTER — Inpatient Hospital Stay (HOSPITAL_COMMUNITY): Payer: BC Managed Care – PPO

## 2023-10-20 ENCOUNTER — Encounter (HOSPITAL_COMMUNITY): Payer: Self-pay

## 2023-10-20 DIAGNOSIS — L509 Urticaria, unspecified: Secondary | ICD-10-CM | POA: Diagnosis present

## 2023-10-20 DIAGNOSIS — R509 Fever, unspecified: Secondary | ICD-10-CM | POA: Diagnosis not present

## 2023-10-20 DIAGNOSIS — R7989 Other specified abnormal findings of blood chemistry: Secondary | ICD-10-CM | POA: Diagnosis not present

## 2023-10-20 DIAGNOSIS — J9811 Atelectasis: Secondary | ICD-10-CM | POA: Diagnosis present

## 2023-10-20 DIAGNOSIS — D219 Benign neoplasm of connective and other soft tissue, unspecified: Secondary | ICD-10-CM | POA: Insufficient documentation

## 2023-10-20 LAB — PROCALCITONIN: Procalcitonin: 0.57 ng/mL

## 2023-10-20 LAB — BASIC METABOLIC PANEL
Anion gap: 13 (ref 5–15)
BUN: 8 mg/dL (ref 6–20)
CO2: 21 mmol/L — ABNORMAL LOW (ref 22–32)
Calcium: 8 mg/dL — ABNORMAL LOW (ref 8.9–10.3)
Chloride: 99 mmol/L (ref 98–111)
Creatinine, Ser: 0.69 mg/dL (ref 0.44–1.00)
GFR, Estimated: >60 mL/min (ref >60)
Glucose, Bld: 91 mg/dL (ref 70–99)
Potassium: 3.8 mmol/L (ref 3.5–5.1)
Sodium: 133 mmol/L — ABNORMAL LOW (ref 135–145)

## 2023-10-20 LAB — URINALYSIS, COMPLETE (UACMP) WITH MICROSCOPIC
Bacteria, UA: NONE SEEN
Bilirubin Urine: NEGATIVE
Glucose, UA: NEGATIVE mg/dL
Hgb urine dipstick: NEGATIVE
Ketones, ur: 20 mg/dL — AB
Leukocytes,Ua: NEGATIVE
Nitrite: NEGATIVE
Protein, ur: NEGATIVE mg/dL
Specific Gravity, Urine: 1.002 — ABNORMAL LOW (ref 1.005–1.030)
pH: 6 (ref 5.0–8.0)

## 2023-10-20 LAB — HEPATIC FUNCTION PANEL
ALT: 204 U/L — ABNORMAL HIGH (ref 0–44)
AST: 41 U/L (ref 15–41)
Albumin: 2.7 g/dL — ABNORMAL LOW (ref 3.5–5.0)
Alkaline Phosphatase: 331 U/L — ABNORMAL HIGH (ref 38–126)
Bilirubin, Direct: 0.2 mg/dL (ref 0.0–0.2)
Indirect Bilirubin: 0.9 mg/dL (ref 0.3–0.9)
Total Bilirubin: 1.1 mg/dL (ref 0.0–1.2)
Total Protein: 6.2 g/dL — ABNORMAL LOW (ref 6.5–8.1)

## 2023-10-20 LAB — CBC
HCT: 36.9 % (ref 36.0–46.0)
Hemoglobin: 11.7 g/dL — ABNORMAL LOW (ref 12.0–15.0)
MCH: 29.5 pg (ref 26.0–34.0)
MCHC: 31.7 g/dL (ref 30.0–36.0)
MCV: 93.2 fL (ref 80.0–100.0)
Platelets: 222 10*3/uL (ref 150–400)
RBC: 3.96 MIL/uL (ref 3.87–5.11)
RDW: 13.5 % (ref 11.5–15.5)
WBC: 6.1 10*3/uL (ref 4.0–10.5)
nRBC: 0 % (ref 0.0–0.2)

## 2023-10-20 LAB — BRAIN NATRIURETIC PEPTIDE: B Natriuretic Peptide: 26.4 pg/mL (ref 0.0–100.0)

## 2023-10-20 LAB — GROUP A STREP BY PCR: Group A Strep by PCR: NOT DETECTED

## 2023-10-20 LAB — GLUCOSE, CAPILLARY: Glucose-Capillary: 104 mg/dL — ABNORMAL HIGH (ref 70–99)

## 2023-10-20 MED ORDER — SODIUM CHLORIDE (PF) 0.9 % IJ SOLN
INTRAMUSCULAR | Status: AC
  Filled 2023-10-20: qty 50

## 2023-10-20 MED ORDER — IOHEXOL 350 MG/ML SOLN
75.0000 mL | Freq: Once | INTRAVENOUS | Status: AC | PRN
  Administered 2023-10-20: 75 mL via INTRAVENOUS

## 2023-10-20 MED ORDER — METHYLPREDNISOLONE SODIUM SUCC 125 MG IJ SOLR
125.0000 mg | Freq: Once | INTRAMUSCULAR | Status: AC
  Administered 2023-10-20: 125 mg via INTRAVENOUS
  Filled 2023-10-20: qty 2

## 2023-10-20 MED ORDER — METHYLPREDNISOLONE SODIUM SUCC 40 MG IJ SOLR
40.0000 mg | Freq: Two times a day (BID) | INTRAMUSCULAR | Status: DC
  Administered 2023-10-20 – 2023-10-22 (×4): 40 mg via INTRAVENOUS
  Filled 2023-10-20 (×4): qty 1

## 2023-10-20 MED ORDER — NYSTATIN 100000 UNIT/ML MT SUSP
5.0000 mL | Freq: Four times a day (QID) | OROMUCOSAL | Status: DC
  Administered 2023-10-20 – 2023-10-22 (×8): 500000 [IU] via ORAL
  Filled 2023-10-20 (×7): qty 5

## 2023-10-20 NOTE — Progress Notes (Addendum)
Subjective: CC: Rash  Afebrile overnight  Patient reports that her rash is worse today and more itchy.  She reports some sinus pressure and congestion. She is having a sore throat today.  She reports she does have a 56-year-old within her family that has frequent URIs.   No neck pain.  She denies any chest pain.  She is having shortness of breath that is worse today.  Shortness of breath is worse when lying down.  No history of CHF.  Shortness of breath does not seem any worse than walking in the room.  She has not walked in the halls.  She reports a dry cough.  No hemoptysis. No LE pain or edema/swelling reported.   She reports no abdominal pain today except for some soreness around her incisions with movement.  Feels it is well-controlled.  Tolerating regular diet without nausea or vomiting.  She had a bowel movement today that was nonbloody.  She is voiding without issues.  Denies dysuria, hematuria or changes in frequency. No vaginal discharge.  Objective: Vital signs in last 24 hours: Temp:  [98 F (36.7 C)-99.1 F (37.3 C)] 98.6 F (37 C) (02/20 1003) Pulse Rate:  [87-107] 98 (02/20 1003) Resp:  [14-18] 17 (02/20 1003) BP: (96-108)/(60-66) 97/61 (02/20 1003) SpO2:  [92 %-96 %] 92 % (02/20 1003) Last BM Date : 10/17/23  Intake/Output from previous day: 02/19 0701 - 02/20 0700 In: 935.7 [P.O.:620; IV Piggyback:315.7] Out: 2200 [Urine:2200] Intake/Output this shift: Total I/O In: 240 [P.O.:240] Out: -   PE: Gen:  Alert, NAD, pleasant HEENT: The patient has normal phonation and is in control of secretions. No stridor.  Midline uvula without edema. Soft palate rises symmetrically. Tonsillar erythema/petechiae with some exudates on L. Tongue protrusion is normal. White plaque on tongue, ? Thrush. No lip swelling or tongue swelling. Mucus membranes moist. No MM sloughing. No sinus ttp.  Neck: Normal rom without meningeal signs.  Card:  RRR Pulm:  CTAB, no W/R/R,  effort normal. On RA. 750 on IS Abd: Soft, no distension, appropriately tender around laparoscopic incisions, no rigidity or guarding and otherwise NT, +BS. Incisions with glue intact. Rash noted and worse localized around incisions. No obvious drainage, bleeding, or signs of infection Ext:  No LE edema or calf tenderness Psych: A&Ox3  Skin: Blanchable erythematous rash that is slightly raised with areas of excoriation as below. Spares palms and soles.                 Lab Results:  Recent Labs    10/19/23 0515 10/20/23 0448  WBC 5.7 6.1  HGB 12.1 11.7*  HCT 37.7 36.9  PLT 209 222   BMET Recent Labs    10/19/23 0515 10/20/23 0448  NA 133* 133*  K 3.7 3.8  CL 102 99  CO2 20* 21*  GLUCOSE 104* 91  BUN 7 8  CREATININE 0.44 0.69  CALCIUM 8.2* 8.0*   PT/INR No results for input(s): "LABPROT", "INR" in the last 72 hours. CMP     Component Value Date/Time   NA 133 (L) 10/20/2023 0448   K 3.8 10/20/2023 0448   CL 99 10/20/2023 0448   CO2 21 (L) 10/20/2023 0448   GLUCOSE 91 10/20/2023 0448   BUN 8 10/20/2023 0448   CREATININE 0.69 10/20/2023 0448   CALCIUM 8.0 (L) 10/20/2023 0448   PROT 6.2 (L) 10/20/2023 0448   ALBUMIN 2.7 (L) 10/20/2023 0448   AST 41 10/20/2023 0448  ALT 204 (H) 10/20/2023 0448   ALKPHOS 331 (H) 10/20/2023 0448   BILITOT 1.1 10/20/2023 0448   GFRNONAA >60 10/20/2023 0448   Lipase     Component Value Date/Time   LIPASE 39 10/19/2023 1025    Studies/Results: DG CHEST PORT 1 VIEW Result Date: 10/19/2023 CLINICAL DATA:  Fever. EXAM: PORTABLE CHEST 1 VIEW COMPARISON:  Lung bases from MRCP earlier today FINDINGS: Low lung volumes. Small bilateral pleural effusions with bibasilar airspace disease. Normal heart size. No pulmonary edema. No pneumothorax. IMPRESSION: Low lung volumes with small bilateral pleural effusions and bibasilar airspace disease, atelectasis versus pneumonia. Electronically Signed   By: Narda Rutherford M.D.   On:  10/19/2023 14:47   NM HEPATOBILIARY LEAK (POST-SURGICAL) Result Date: 10/19/2023 CLINICAL DATA:  Abdominal pain.  Cholecystectomy 10/15/2023 EXAM: NUCLEAR MEDICINE HEPATOBILIARY IMAGING TECHNIQUE: Sequential images of the abdomen were obtained out to 60 minutes following intravenous administration of radiopharmaceutical. RADIOPHARMACEUTICALS:  5.47 mCi Tc-75m  Choletec IV COMPARISON:  Abdominal MRI 10/19/2023 FINDINGS: Prompt clearance radiotracer from blood pool and homogeneous uptake in liver. Counts are evident the common bile duct and small bowel by 10 minutes. No excreted radiotracer extraluminal to the bile ducts. IMPRESSION: 1. No evidence of biliary leak. 2. Patent common bile duct. Electronically Signed   By: Genevive Bi M.D.   On: 10/19/2023 13:44   MR ABDOMEN MRCP W WO CONTAST Result Date: 10/19/2023 CLINICAL DATA:  Cholelithiasis, rule out choledocholithiasis or other postoperative complication status post laparoscopic cholecystectomy EXAM: MRI ABDOMEN WITHOUT AND WITH CONTRAST (INCLUDING MRCP) TECHNIQUE: Multiplanar multisequence MR imaging of the abdomen was performed both before and after the administration of intravenous contrast. Heavily T2-weighted images of the biliary and pancreatic ducts were obtained, and three-dimensional MRCP images were rendered by post processing. CONTRAST:  6mL GADAVIST GADOBUTROL 1 MMOL/ML IV SOLN COMPARISON:  10/14/2023 FINDINGS: Lower chest: Small bilateral pleural effusions and associated heterogeneous and consolidative airspace disease throughout the lung bases. Hepatobiliary: No solid liver abnormality is seen. Hepatomegaly, maximum coronal span 20.9 cm. Status post cholecystectomy. Minimal postoperative fluid in the gallbladder fossa (series 4, image 24). No biliary ductal dilatation. Pancreas: Unremarkable. No pancreatic ductal dilatation or surrounding inflammatory changes. Spleen: Normal in size without significant abnormality. Adrenals/Urinary Tract:  Adrenal glands are unremarkable. Kidneys are normal, without renal calculi, solid lesion, or hydronephrosis. Stomach/Bowel: Stomach is within normal limits. No evidence of bowel wall thickening, distention, or inflammatory changes. Vascular/Lymphatic: No significant vascular findings are present. No enlarged abdominal lymph nodes. Other: No abdominal wall hernia. Soft tissue edema about the right hemiabdomen. No ascites. Musculoskeletal: No acute or significant osseous findings. IMPRESSION: 1. Status post cholecystectomy. Minimal postoperative fluid in the gallbladder fossa. No biliary ductal dilatation or evidence of choledocholithiasis. 2. Small bilateral pleural effusions and associated heterogeneous and consolidative airspace disease throughout the lung bases. 3. Hepatomegaly. Electronically Signed   By: Jearld Lesch M.D.   On: 10/19/2023 08:47   MR 3D Recon At Scanner Result Date: 10/19/2023 CLINICAL DATA:  Cholelithiasis, rule out choledocholithiasis or other postoperative complication status post laparoscopic cholecystectomy EXAM: MRI ABDOMEN WITHOUT AND WITH CONTRAST (INCLUDING MRCP) TECHNIQUE: Multiplanar multisequence MR imaging of the abdomen was performed both before and after the administration of intravenous contrast. Heavily T2-weighted images of the biliary and pancreatic ducts were obtained, and three-dimensional MRCP images were rendered by post processing. CONTRAST:  6mL GADAVIST GADOBUTROL 1 MMOL/ML IV SOLN COMPARISON:  10/14/2023 FINDINGS: Lower chest: Small bilateral pleural effusions and associated heterogeneous and consolidative airspace disease  throughout the lung bases. Hepatobiliary: No solid liver abnormality is seen. Hepatomegaly, maximum coronal span 20.9 cm. Status post cholecystectomy. Minimal postoperative fluid in the gallbladder fossa (series 4, image 24). No biliary ductal dilatation. Pancreas: Unremarkable. No pancreatic ductal dilatation or surrounding inflammatory changes.  Spleen: Normal in size without significant abnormality. Adrenals/Urinary Tract: Adrenal glands are unremarkable. Kidneys are normal, without renal calculi, solid lesion, or hydronephrosis. Stomach/Bowel: Stomach is within normal limits. No evidence of bowel wall thickening, distention, or inflammatory changes. Vascular/Lymphatic: No significant vascular findings are present. No enlarged abdominal lymph nodes. Other: No abdominal wall hernia. Soft tissue edema about the right hemiabdomen. No ascites. Musculoskeletal: No acute or significant osseous findings. IMPRESSION: 1. Status post cholecystectomy. Minimal postoperative fluid in the gallbladder fossa. No biliary ductal dilatation or evidence of choledocholithiasis. 2. Small bilateral pleural effusions and associated heterogeneous and consolidative airspace disease throughout the lung bases. 3. Hepatomegaly. Electronically Signed   By: Jearld Lesch M.D.   On: 10/19/2023 08:47    Anti-infectives: Anti-infectives (From admission, onward)    Start     Dose/Rate Route Frequency Ordered Stop   10/19/23 0200  meropenem (MERREM) 1 g in sodium chloride 0.9 % 100 mL IVPB        1 g 200 mL/hr over 30 Minutes Intravenous Every 8 hours 10/18/23 1719     10/18/23 1700  meropenem (MERREM) 1 g in sodium chloride 0.9 % 100 mL IVPB        1 g 200 mL/hr over 30 Minutes Intravenous STAT 10/18/23 1653 10/18/23 1748        Assessment/Plan POD 5 s/p Laparoscopic Cholecystectomy by Dr. Andrey Campanile on 10/15/23 - Readmitted for fevers, elevated LFT's, and rash  Fever - Fever resolved. Intermittently tachycardic. BP 94/62 - 108/66. WBC wnl - MRCP w/ minimal post operative fluid in GB fossa, no biliary ductal dilation or evidence of choledocholithiasis. Reviewed with attending. Nothing appears drainable by IR - HIDA negative for bile leak and patent CBD - MRI abdomen w/ small b/l pleural effusion and consolidative airspace disease. CXR w/ small b/l pleural effusions and  bibasilar airspace disease. She does have some sob and a dry cough. Resp Panel 2/14 neg for Flu, COVID and RSV. Given non-productive cough can't get resp cx currently. Given recent surgery, sob, fever, and tachycardia will get CTA PE and LE [place] (CTA chest will also aid to better eval the lungs to see if this is radiographically more of a PNA; check procalcitonin as well). Will check BNP for completeness as well given main complaint of sob is orthopnea.  - She reports possible sick contact and now has sore throat with tonsillar petechiae and exudates - check strep - Check UA for completeness though she denies urinary symptoms and UA 6d ago reassuring so think this will be low yield.  - Blood cx pending for completeness  - Given unclear etiology of fever with worsening rash despite below interventions and her multiple allergies I think we should hold abx for now, cont w/u, cont to trend labs and monitor vitals. If spikes fevers again, w/u neg or inconclusive will need to get TRH and/or ID involved for additional recommendations   Elevated LFT's - T. Bili wnl. Alk Phos up from 317 > 331. AST normalized at 41. ALT downtrending at 204 from 293.  - See results of MRCP and HIDA above - Hepatitis panel neg - Reports she was taking 2g tylenol (1000mg  BID) daily for pain control at home. Past the point  we can check a tylenol level now.  - She reports no alcohol use in the last 1 year  - Reviewed home meds and OTC meds w/ pharm to see if these contributing  to elevated LFT's - only collagen and acarbose could be causing elevated LFT's - hold these meds here.  - Cont to trend to ensure improving before d/c - If LFT's worsen, consider touching base with GI  Rash - Unclear etiology of the rash. See hx from progress note 2/19. Question related to Rocephin or CHG bath peri-op. -  Change prednisone taper to solumedrol given worsening rash. Discussed dosing with pharmacy yesterday and today.  - Cont Triamcinolone  cream.  - Cont benadryl prn for itching.  - Hold abx  - Will review with attending if we should remove dermbond from incisions (she reports no known allergy to adhesive removers).   FEN - Reg diet. SLIV VTE - SCDs, Lovenox ID - Merrem 2/18 - 2/20. Hold abx for now. May need to restart abx if w/u above shows anything obvious. If restart abx, will need to review with ID/Pharm if any cross reactivity of Merrem and her other abx allergies and determine best abx to restart. Nystatin mouthwash for thrush like changes    LOS: 2 days    Jacinto Halim, [facility] 10/20/2023, 10:27 AM Please see Amion for pager number during day hours 7:00am-4:30pm

## 2023-10-20 NOTE — Progress Notes (Signed)
BLE venous duplex has been completed.   Results can be found under chart review under CV PROC. 10/20/2023 4:03 PM Felisia Balcom RVT, RDMS

## 2023-10-20 NOTE — Consult Note (Signed)
Initial Consultation Note   Patient: Lori King:[id number] DOB: [birth year] PCP: Bernadette Hoit, MD DOA: 10/18/2023 DOS: the patient was seen and examined on 10/20/2023 Primary service: Montez Morita Md, MD  Referring physician: Gaynelle Adu, MD Reason for consult: Allergic rash. Assessment and Plan: Principal Problem:   Abnormal LFTs (liver function tests) Interval monitoring of LFTs.  Active Problems:   Urticaria Continue current therapy with steroids and antihistamines.    Fever In the absence of infectious source, likely related to the rash. CCS discussed case with ID on-call. -Follow infectious diseases recommendation.    Atelectasis  Reminded to perform incentive spirometry hourly while awake.   TRH will sign off at present, please call us again when needed.  HPI: Lori King is a 58 y.o. female with below past medical history who on February 15 underwent laparoscopic cholecystectomy with Dr. Andrey Campanile, discharged home the next day and then presented 2 days after discharge with fever and a rash.  She received ceftriaxone on the first hospitalization and thinks that he may have been due to a reaction to it. No sore throat, rhinorrhea, dyspnea, wheezing or hemoptysis.  No chest pain, palpitations, diaphoresis, PND, orthopnea or pitting edema of the lower extremities.  No melena or hematochezia.  No flank pain, dysuria, frequency or hematuria.  No polyuria, polydipsia, polyphagia or blurred vision.  Review of Systems: As mentioned in the history of present illness. All other systems reviewed and are negative. Past Medical History:  Diagnosis Date   Adhesive capsulitis of right shoulder    Allergic rhinitis, seasonal    Anemia    Anxiety    MDD (major depressive disorder)    Wears contact lenses    Past Surgical History:  Procedure Laterality Date   BREAST BIOPSY Left 2010   x2 per pt benign   CESAREAN SECTION  X2  last one 02-28-2001   W/  BILATERAL TUBAL LIGATION w/  last c/s   CHOLECYSTECTOMY N/A 10/15/2023   Procedure: LAPAROSCOPIC CHOLECYSTECTOMY WITH INTRAOPERATIVE CHOLANGIOGRAM AND ICG DYE;  Surgeon: Gaynelle Adu, MD;  Location: [facility];  Service: General;  Laterality: N/A;   COLONOSCOPY  11/01/2017   HYSTEROSCOPY W/ ENDOMETRIAL ABLATION  2019   LUMBAR LAMINECTOMY/DECOMPRESSION MICRODISCECTOMY  2013   L4  --- S1   SHOULDER ARTHROSCOPY Right 08/28/2020   Procedure: ARTHROSCOPY SHOULDER lysis of adhesions and manipulation under anesthesia, extensive debridement;  Surgeon: Yolonda Kida, MD;  Location: [facility];  Service: Orthopedics;  Laterality: Right;   Social History:  reports that she has never smoked. She has never used smokeless tobacco. She reports that she does not currently use alcohol. She reports that she does not use drugs.  Allergies  Allergen Reactions   Other Anaphylaxis    ALL TYPES OF FISH   Shellfish Allergy Anaphylaxis    ALL SHELLFISH   Doxycycline Itching   Ciprofloxacin Rash   Penicillins Rash    Did it involve swelling of the face/tongue/throat, SOB, or low BP? No Did it involve sudden or severe rash/hives, skin peeling, or any reaction on the inside of your mouth or nose? Yes Did you need to seek medical attention at a hospital or doctor's office? No When did it last happen?  Over 30 years ago.     If all above answers are "NO", may proceed with cephalosporin use.      Family History  Problem Relation Age of Onset   Breast cancer Mother 62   Breast cancer Sister 3  Prior to Admission medications   Medication Sig Start Date End Date Taking? Authorizing Provider  acarbose (PRECOSE) 100 MG tablet Take 100 mg by mouth 3 (three) times daily with meals.   Yes [provider]  acetaminophen (TYLENOL) 500 MG tablet Take 500 mg by mouth every 6 (six) hours as needed.   Yes [provider]  calcium carbonate (OS-CAL - DOSED IN MG OF ELEMENTAL CALCIUM) 1250 (500 Ca) MG tablet  Take 1 tablet by mouth daily.   Yes [provider]  cholecalciferol (VITAMIN D3) 25 MCG (1000 UNIT) tablet Take 2,000 Units by mouth daily.   Yes [provider]  DULoxetine (CYMBALTA) 30 MG capsule Take 30 mg by mouth daily.   Yes [provider]  fluticasone (FLONASE) 50 MCG/ACT nasal spray Place 1 spray into both nostrils daily as needed for allergies.   Yes [provider]  gabapentin (NEURONTIN) 300 MG capsule Take 300 mg by mouth at bedtime.   Yes [provider]  loratadine (CLARITIN) 10 MG tablet Take 10 mg by mouth daily as needed for allergies.   Yes [provider]  LORazepam (ATIVAN) 0.5 MG tablet Take 0.5 mg by mouth every 6 (six) hours as needed for anxiety.   Yes [provider]  magnesium 30 MG tablet Take 60 mg by mouth at bedtime.   Yes [provider]  oxyCODONE (OXY IR/ROXICODONE) 5 MG immediate release tablet Take 1 tablet (5 mg total) by mouth every 6 (six) hours as needed for severe pain (pain score 7-10). 10/15/23  Yes Gaynelle Adu, MD  potassium chloride (MICRO-K) 10 MEQ CR capsule Take 10 mEq by mouth 2 (two) times daily.   Yes [provider]  triamcinolone ointment (KENALOG) 0.1 % Apply 1 Application topically 2 (two) times daily. 10/17/23  Yes [provider]  EPINEPHrine (EPIPEN 2-PAK) 0.3 mg/0.3 mL IJ SOAJ injection Inject 0.3 mLs (0.3 mg total) into the muscle once as needed (for severe allergic reaction). CAll 911 immediately if you have to use this medicine 03/20/17   Garlon Hatchet, PA-C    Physical Exam: Vitals:   10/19/23 1353 10/19/23 2200 10/20/23 0635 10/20/23 1003  BP: 96/63 97/60 108/66 97/61  Pulse: 87 93 (!) 107 98  Resp: 18 14 15 17   Temp: 98.6 F (37 C) 98 F (36.7 C) 99.1 F (37.3 C) 98.6 F (37 C)  TempSrc: Oral Oral Oral Oral  SpO2: 96% 95% 92% 92%  Weight:      Height:       Physical Exam Vitals and nursing note reviewed.  Constitutional:       General: She is awake. She is not in acute distress.    Appearance: Normal appearance. She is ill-appearing.  HENT:     Head: Normocephalic.     Nose: Nose normal. No rhinorrhea.     Mouth/Throat:     Mouth: Mucous membranes are moist.  Eyes:     General: No scleral icterus.    Pupils: Pupils are equal, round, and reactive to light.  Neck:     Vascular: No JVD.  Cardiovascular:     Rate and Rhythm: Normal rate and regular rhythm.     Heart sounds: S1 normal and S2 normal.  Pulmonary:     Effort: Pulmonary effort is normal.     Breath sounds: Examination of the right-lower field reveals decreased breath sounds. Examination of the left-lower field reveals decreased breath sounds. Decreased breath sounds present. No wheezing, rhonchi  or rales.  Abdominal:     General: Bowel sounds are normal.     Palpations: Abdomen is soft.  Musculoskeletal:     Cervical back: Neck supple.     Right lower leg: No edema.     Left lower leg: No edema.  Skin:    General: Skin is warm and dry.     Coloration: Skin is not jaundiced.     Comments: The patient declined to allow inspection of the affected area.  I offered that chaperone, but she stated he has been evaluated already today.  Neurological:     General: No focal deficit present.     Mental Status: She is alert and oriented to person, place, and time.  Psychiatric:        Mood and Affect: Mood normal.        Behavior: Behavior is cooperative.    Data Reviewed:   Results are pending, will review when available.   Family Communication:  Primary team communication:  Thank you very much for involving [place] in the care of your patient.  Author: Bobette Mo, MD 10/20/2023 12:01 PM  For on call review [email or web address].   This document was prepared using Dragon voice recognition software and may contain some unintended transcription errors.

## 2023-10-21 LAB — COMPREHENSIVE METABOLIC PANEL
ALT: 178 U/L — ABNORMAL HIGH (ref 0–44)
AST: 36 U/L (ref 15–41)
Albumin: 3 g/dL — ABNORMAL LOW (ref 3.5–5.0)
Alkaline Phosphatase: 357 U/L — ABNORMAL HIGH (ref 38–126)
Anion gap: 13 (ref 5–15)
BUN: 14 mg/dL (ref 6–20)
CO2: 23 mmol/L (ref 22–32)
Calcium: 8.7 mg/dL — ABNORMAL LOW (ref 8.9–10.3)
Chloride: 99 mmol/L (ref 98–111)
Creatinine, Ser: 0.53 mg/dL (ref 0.44–1.00)
GFR, Estimated: >60 mL/min (ref >60)
Glucose, Bld: 169 mg/dL — ABNORMAL HIGH (ref 70–99)
Potassium: 3.6 mmol/L (ref 3.5–5.1)
Sodium: 135 mmol/L (ref 135–145)
Total Bilirubin: 0.7 mg/dL (ref 0.0–1.2)
Total Protein: 7 g/dL (ref 6.5–8.1)

## 2023-10-21 LAB — CBC
HCT: 37.7 % (ref 36.0–46.0)
Hemoglobin: 12 g/dL (ref 12.0–15.0)
MCH: 29.6 pg (ref 26.0–34.0)
MCHC: 31.8 g/dL (ref 30.0–36.0)
MCV: 93.1 fL (ref 80.0–100.0)
Platelets: 265 10*3/uL (ref 150–400)
RBC: 4.05 MIL/uL (ref 3.87–5.11)
RDW: 13.3 % (ref 11.5–15.5)
WBC: 9.8 10*3/uL (ref 4.0–10.5)
nRBC: 0 % (ref 0.0–0.2)

## 2023-10-21 NOTE — Plan of Care (Signed)

## 2023-10-21 NOTE — Progress Notes (Signed)
Mobility Specialist - Progress Note   10/21/23 0912  Mobility  Activity Ambulated independently in hallway  Level of Assistance Independent  Assistive Device None  Distance Ambulated (ft) 500 ft  Activity Response Tolerated well  Mobility Referral Yes  Mobility visit 1 Mobility  Mobility Specialist Start Time (ACUTE ONLY) 0905  Mobility Specialist Stop Time (ACUTE ONLY) 0912  Mobility Specialist Time Calculation (min) (ACUTE ONLY) 7 min   Pt received in bed and agreeable to mobility. No complaints during session. Pt to bed after session with all needs met.    [facility]

## 2023-10-21 NOTE — Progress Notes (Signed)
Subjective: CC: Rash  Afebrile overnight  Continued pruritic rash.   SOB stable to slightly improved. Non-productive cough. SOB not worsened by mobilization. On RA. No CP.   She reports no abdominal pain today except for some soreness around her incisions with coughing.  Feels pain is well-controlled.  Tolerating regular diet without nausea or vomiting.  She had a bowel movement yesterday that was nonbloody.  She is voiding without issues.    Mobilizing.  Objective: Vital signs in last 24 hours: Temp:  [97.6 F (36.4 C)-98.3 F (36.8 C)] 97.6 F (36.4 C) (02/21 0546) Pulse Rate:  [67-84] 67 (02/21 0546) Resp:  [17-18] 18 (02/21 0546) BP: (96-104)/(58-67) 104/67 (02/21 0546) SpO2:  [92 %-94 %] 94 % (02/21 0546) Last BM Date : 10/20/23  Intake/Output from previous day: 02/20 0701 - 02/21 0700 In: 1160 [P.O.:1160] Out: 300 [Urine:300] Intake/Output this shift: No intake/output data recorded.  PE: Gen:  Alert, NAD, pleasant HEENT: The patient has normal phonation and is in control of secretions. No stridor.  Midline uvula without edema. No lip swelling or tongue swelling. Mucus membranes moist. No MM sloughing.  Card:  RRR Pulm:  CTAB, no W/R/R, effort normal. On RA. Abd: Soft, no distension, appropriately tender around laparoscopic incisions, no rigidity or guarding and otherwise NT, +BS. Incisions with glue intact. Rash noted and worse localized around incisions. No obvious drainage, bleeding, or signs of infection Ext:  No LE edema  Psych: A&Ox3  Skin: Blanchable erythematous rash that is slightly raised with areas of excoriation as below. Spares palms and soles.                Lab Results:  Recent Labs    10/20/23 0448 10/21/23 0447  WBC 6.1 9.8  HGB 11.7* 12.0  HCT 36.9 37.7  PLT 222 265   BMET Recent Labs    10/20/23 0448 10/21/23 0447  NA 133* 135  K 3.8 3.6  CL 99 99  CO2 21* 23  GLUCOSE 91 169*  BUN 8 14  CREATININE 0.69  0.53  CALCIUM 8.0* 8.7*   PT/INR No results for input(s): "LABPROT", "INR" in the last 72 hours. CMP     Component Value Date/Time   NA 135 10/21/2023 0447   K 3.6 10/21/2023 0447   CL 99 10/21/2023 0447   CO2 23 10/21/2023 0447   GLUCOSE 169 (H) 10/21/2023 0447   BUN 14 10/21/2023 0447   CREATININE 0.53 10/21/2023 0447   CALCIUM 8.7 (L) 10/21/2023 0447   PROT 7.0 10/21/2023 0447   ALBUMIN 3.0 (L) 10/21/2023 0447   AST 36 10/21/2023 0447   ALT 178 (H) 10/21/2023 0447   ALKPHOS 357 (H) 10/21/2023 0447   BILITOT 0.7 10/21/2023 0447   GFRNONAA >60 10/21/2023 0447   Lipase     Component Value Date/Time   LIPASE 39 10/19/2023 1025    Studies/Results: VAS [place] LOWER EXTREMITY VENOUS (DVT) Result Date: 10/20/2023  Lower Venous DVT Study Patient Name:  OLETHA TOLSON  Date of Exam:   10/20/2023 Medical Rec #: [id number]          Accession #:    [id number] Date of Birth: 1966-02-11          Patient Gender: F Patient Age:   58 years Exam Location:  [facility] Procedure:      VAS [place] LOWER EXTREMITY VENOUS (DVT) Referring Phys: Demonie Kassa --------------------------------------------------------------------------------  Indications: Fever.  Risk Factors: Surgery Laproscopic cholecystectomy  10/15/2023. Comparison Study: No previous exams Performing Technologist: Jody Hill RVT, RDMS  Examination Guidelines: A complete evaluation includes B-mode imaging, spectral Doppler, color Doppler, and power Doppler as needed of all accessible portions of each vessel. Bilateral testing is considered an integral part of a complete examination. Limited examinations for reoccurring indications may be performed as noted. The reflux portion of the exam is performed with the patient in reverse Trendelenburg.  +---------+---------------+---------+-----------+----------+--------------+ RIGHT    CompressibilityPhasicitySpontaneityPropertiesThrombus Aging  +---------+---------------+---------+-----------+----------+--------------+ CFV      Full           Yes      Yes                                 +---------+---------------+---------+-----------+----------+--------------+ SFJ      Full                                                        +---------+---------------+---------+-----------+----------+--------------+ FV Prox  Full           Yes      Yes                                 +---------+---------------+---------+-----------+----------+--------------+ FV Mid   Full           Yes      Yes                                 +---------+---------------+---------+-----------+----------+--------------+ FV DistalFull           Yes      Yes                                 +---------+---------------+---------+-----------+----------+--------------+ PFV      Full                                                        +---------+---------------+---------+-----------+----------+--------------+ POP      Full           Yes      Yes                                 +---------+---------------+---------+-----------+----------+--------------+ PTV      Full                                                        +---------+---------------+---------+-----------+----------+--------------+ PERO     Full                                                        +---------+---------------+---------+-----------+----------+--------------+   +---------+---------------+---------+-----------+----------+--------------+  LEFT     CompressibilityPhasicitySpontaneityPropertiesThrombus Aging +---------+---------------+---------+-----------+----------+--------------+ CFV      Full           Yes      Yes                                 +---------+---------------+---------+-----------+----------+--------------+ SFJ      Full                                                         +---------+---------------+---------+-----------+----------+--------------+ FV Prox  Full           Yes      Yes                                 +---------+---------------+---------+-----------+----------+--------------+ FV Mid   Full           Yes      Yes                                 +---------+---------------+---------+-----------+----------+--------------+ FV DistalFull           Yes      Yes                                 +---------+---------------+---------+-----------+----------+--------------+ PFV      Full                                                        +---------+---------------+---------+-----------+----------+--------------+ POP      Full           Yes      Yes                                 +---------+---------------+---------+-----------+----------+--------------+ PTV      Full                                                        +---------+---------------+---------+-----------+----------+--------------+ PERO     Full                                                        +---------+---------------+---------+-----------+----------+--------------+     Summary: BILATERAL: - No evidence of deep vein thrombosis seen in the lower extremities, bilaterally. -No evidence of popliteal cyst, bilaterally.   *See table(s) above for measurements and observations. Electronically signed by Heath Lark on 10/20/2023 at 5:03:17 PM.    Final    CT Angio Chest Pulmonary Embolism (PE) W or WO Contrast Result Date: 10/20/2023 CLINICAL  DATA:  Intermittent tachycardia. Hypotension. No fever. No leukocytosis. EXAM: CT ANGIOGRAPHY CHEST WITH CONTRAST TECHNIQUE: Multidetector CT imaging of the chest was performed using the standard protocol during bolus administration of intravenous contrast. Multiplanar CT image reconstructions and MIPs were obtained to evaluate the vascular anatomy. RADIATION DOSE REDUCTION: This exam was performed according to the  departmental dose-optimization program which includes automated exposure control, adjustment of the mA and/or kV according to patient size and/or use of iterative reconstruction technique. CONTRAST:  75mL OMNIPAQUE IOHEXOL 350 MG/ML SOLN COMPARISON:  Chest radiographs dated 10/19/2023 and 02/18/2017 FINDINGS: Cardiovascular: Normally opacified pulmonary arteries with no pulmonary arterial filling defects seen. Atheromatous calcifications, including the coronary arteries and aorta. Borderline enlarged heart. No pericardial effusion. Prominent pulmonary vasculature. Mediastinum/Nodes: No enlarged mediastinal, hilar, or axillary lymph nodes. Thyroid gland, trachea, and esophagus demonstrate no significant findings. Lungs/Pleura: Mildly prominent interstitial markings. Bilateral lower lobe and posterior lingular atelectasis. Small bilateral pleural effusions. Upper Abdomen: Cholecystectomy clips. Musculoskeletal: Mild thoracic spine degenerative changes. Review of the MIP images confirms the above findings. IMPRESSION: 1. No pulmonary embolus. 2. Mild congestive heart failure. 3. Bilateral lower lobe and posterior lingular atelectasis. 4.  Calcific coronary artery and aortic atherosclerosis. Aortic Atherosclerosis (ICD10-I70.0). Electronically Signed   By: Beckie Salts M.D.   On: 10/20/2023 14:44   DG CHEST PORT 1 VIEW Result Date: 10/19/2023 CLINICAL DATA:  Fever. EXAM: PORTABLE CHEST 1 VIEW COMPARISON:  Lung bases from MRCP earlier today FINDINGS: Low lung volumes. Small bilateral pleural effusions with bibasilar airspace disease. Normal heart size. No pulmonary edema. No pneumothorax. IMPRESSION: Low lung volumes with small bilateral pleural effusions and bibasilar airspace disease, atelectasis versus pneumonia. Electronically Signed   By: Narda Rutherford M.D.   On: 10/19/2023 14:47   NM HEPATOBILIARY LEAK (POST-SURGICAL) Result Date: 10/19/2023 CLINICAL DATA:  Abdominal pain.  Cholecystectomy 10/15/2023 EXAM:  NUCLEAR MEDICINE HEPATOBILIARY IMAGING TECHNIQUE: Sequential images of the abdomen were obtained out to 60 minutes following intravenous administration of radiopharmaceutical. RADIOPHARMACEUTICALS:  5.47 mCi Tc-22m  Choletec IV COMPARISON:  Abdominal MRI 10/19/2023 FINDINGS: Prompt clearance radiotracer from blood pool and homogeneous uptake in liver. Counts are evident the common bile duct and small bowel by 10 minutes. No excreted radiotracer extraluminal to the bile ducts. IMPRESSION: 1. No evidence of biliary leak. 2. Patent common bile duct. Electronically Signed   By: Genevive Bi M.D.   On: 10/19/2023 13:44    Anti-infectives: Anti-infectives (From admission, onward)    Start     Dose/Rate Route Frequency Ordered Stop   10/19/23 0200  meropenem (MERREM) 1 g in sodium chloride 0.9 % 100 mL IVPB  Status:  Discontinued        1 g 200 mL/hr over 30 Minutes Intravenous Every 8 hours 10/18/23 1719 10/20/23 1045   10/18/23 1700  meropenem (MERREM) 1 g in sodium chloride 0.9 % 100 mL IVPB        1 g 200 mL/hr over 30 Minutes Intravenous STAT 10/18/23 1653 10/18/23 1748        Assessment/Plan POD 6 s/p Laparoscopic Cholecystectomy by Dr. Andrey Campanile on 10/15/23 - Readmitted for fevers, elevated LFT's, and rash  Fever - Fever resolved. Tachycardia resolved. BP improved - last 104/67.  - MRCP w/ minimal post operative fluid in GB fossa, no biliary ductal dilation or evidence of choledocholithiasis. Reviewed with attending. Nothing appears drainable by IR - HIDA negative for bile leak and patent CBD - MRI abdomen w/ small b/l pleural effusion and consolidative airspace disease.  CXR w/ small b/l pleural effusions and bibasilar airspace disease. She does have some sob and a dry cough. Resp Panel 2/14 neg for Flu, COVID and RSV. CTA PE with no PE, BLL atelectasias, mild CHF. No hx of CHF - BNP wnl. - LE [place] neg for DVT.  - She reports possible sick contact and now has sore throat with tonsillar  petechiae and exudates - strep negative - UA neg for UTI - Blood cx pending for completeness - NGTD - TRH/ID consulted. TRH s/o. ID agreed with stopping abx.   Elevated LFT's - T. Bili wnl. Alk Phos up from 317 > 331 > 357. AST normalized at 36 ALT downtrending to 178.  - Hx normal LFT's 07/19/23. Reports hx of fatty liver disease followed by GI in [place].  - See results of MRCP and HIDA above - Hepatitis panel neg - Reports she was taking 2g tylenol (1000mg  BID) daily for pain control at home. Past the point we can check a tylenol level now.  - She reports no alcohol use in the last 1 year  - Reviewed home meds and OTC meds w/ pharm to see if these contributing  to elevated LFT's - only collagen and acarbose could be causing elevated LFT's - hold these meds here.  - Cont to trend LFT's to ensure improving before d/c - If LFT's worsen, consider touching base with GI  Rash - Unclear etiology of the rash. See hx from progress note 2/19. Question related to Rocephin or CHG bath peri-op. -  Cont solumedrol. Discussed dosing with pharmacy. If improving, consider d/c on prednisone taper tomorrow.  - Cont Triamcinolone cream.  - Cont benadryl prn for itching.  - Hold abx  - Will review with attending if we should remove dermbond from incisions (she reports no known allergy to adhesive removers).   FEN - Reg diet. SLIV VTE - SCDs, Lovenox ID - Merrem 2/18 - 2/20. Workup so far has been reassuring. Reviewed with ID who agreed with stopping abx. Nystatin mouthwash for thrush like changes  I updated the patients husband on speaker phone while I was in the room.     LOS: 3 days    Jacinto Halim, [facility] 10/21/2023, 10:50 AM Please see Amion for pager number during day hours 7:00am-4:30pm

## 2023-10-22 LAB — GLUCOSE, CAPILLARY: Glucose-Capillary: 193 mg/dL — ABNORMAL HIGH (ref 70–99)

## 2023-10-22 MED ORDER — PREDNISONE 5 MG (48) PO TBPK: ORAL_TABLET | ORAL | 0 refills | Status: DC

## 2023-10-22 MED ORDER — DIPHENHYDRAMINE HCL 25 MG PO TABS: 25.0000 mg | ORAL_TABLET | Freq: Four times a day (QID) | ORAL | 0 refills | Status: DC | PRN

## 2023-10-22 MED ORDER — HYDROXYZINE HCL 10 MG PO TABS: 10.0000 mg | ORAL_TABLET | Freq: Three times a day (TID) | ORAL | 0 refills | Status: DC | PRN

## 2023-10-22 NOTE — Progress Notes (Signed)
 Mobility Specialist - Progress Note   10/22/23 0910  Mobility  Activity Ambulated independently in hallway  Level of Assistance Independent  Assistive Device None  Distance Ambulated (ft) 275 ft  Activity Response Tolerated well  Mobility Referral Yes  Mobility visit 1 Mobility  Mobility Specialist Start Time (ACUTE ONLY) [id number]  Mobility Specialist Stop Time (ACUTE ONLY) 0909  Mobility Specialist Time Calculation (min) (ACUTE ONLY) 6 min   Pt received in bed and agreeable to mobility. No complaints during session. Pt to bed after session with all needs met.   [facility]

## 2023-10-22 NOTE — Discharge Instructions (Signed)
 Proceed with steroid taper as ordered.  OK to continue cool compresses, cool shower or tub soaks if this helps with the itching.  Activity as tolerated- avoid heavy lifting or strenuous exercise for another 1-2 weeks.  Follow up with your cardiologist regarding the CT report of mild cardiac enlargement Follow up with your endocrinologist regarding titration of medications Follow up with your PCP to make them aware of hospitalization and events.

## 2023-10-22 NOTE — Discharge Summary (Signed)
 Physician Discharge Summary  Patient ID: Lori King MRN: [id number] DOB/AGE: [birth year] 58 y.o.  Admit date: 10/18/2023 Discharge date: 10/22/2023  Admission Diagnoses: multiple  Discharge Diagnoses:  Principal Problem:   Abnormal LFTs (liver function tests) Active Problems:   Urticaria   Fever   Atelectasis   Discharged Condition: good  Hospital Course:  POD 6 s/p Laparoscopic Cholecystectomy by Dr. Andrey Campanile on 10/15/23 - Readmitted for fevers, elevated LFT's, and rash   Fever - Fever resolved. Tachycardia resolved. BP improved - last 104/67.  - MRCP w/ minimal post operative fluid in GB fossa, no biliary ductal dilation or evidence of choledocholithiasis. Reviewed with attending. Nothing appears drainable by IR - HIDA negative for bile leak and patent CBD - MRI abdomen w/ small b/l pleural effusion and consolidative airspace disease. CXR w/ small b/l pleural effusions and bibasilar airspace disease. She does have some sob and a dry cough. Resp Panel 2/14 neg for Flu, COVID and RSV. CTA PE with no PE, BLL atelectasias, mild CHF. No hx of CHF - BNP wnl. - LE [place] neg for DVT.  - She reports possible sick contact and now has sore throat with tonsillar petechiae and exudates - strep negative - UA neg for UTI - Blood cx pending for completeness - NGTD - TRH/ID consulted. TRH s/o. ID agreed with stopping abx.    Elevated LFT's - T. Bili wnl. Alk Phos up from 317 > 331 > 357. AST normalized at 36 ALT downtrending to 178.  - Hx normal LFT's 07/19/23. Reports hx of fatty liver disease followed by GI in [place].  - See results of MRCP and HIDA above - Hepatitis panel neg - Reports she was taking 2g tylenol (1000mg  BID) daily for pain control at home. Past the point we can check a tylenol level now.  - She reports no alcohol use in the last 1 year  - Reviewed home meds and OTC meds w/ pharm to see if these contributing  to elevated LFT's - only collagen and acarbose could be causing  elevated LFT's - hold these meds here.  - LFTs continue downtrending, alk phos minimally up   Rash - Unclear etiology of the rash. See hx from progress note 2/19. Question related to Rocephin or CHG bath peri-op. -  DC on steroid taper - Cont Triamcinolone cream.  - Cont benadryl prn for itching.  - Hold abx  - ok to remove dermabond but I do not think this is contributing to rash    FEN - Reg diet. SLIV VTE - SCDs, Lovenox ID - Merrem 2/18 - 2/20. Workup so far has been reassuring. Reviewed with ID who agreed with stopping abx. Nystatin mouthwash for thrush like changes  Discharge Exam: Blood pressure 104/68, pulse 74, temperature 98 F (36.7 C), temperature source Oral, resp. rate 17, height 5\' 2"  (1.575 m), weight 61.8 kg, SpO2 94%. Alert, well appearing Unlabored respirations Abd s/nt, incisions c/d/I with dermabond. No cellulitis. Fading ecchymosis around umbilical incision.  Diffuse maculpapular rash fading  Disposition: Discharge disposition: 01-Home or Self Care      Allergies as of 10/22/2023       Reactions   Other Anaphylaxis   ALL TYPES OF FISH   Shellfish Allergy Anaphylaxis   ALL SHELLFISH   Doxycycline Itching   Merrem [meropenem]    Possible rash vs related to prior abx (Rocephin)   Ciprofloxacin Rash   Penicillins Rash   Did it involve swelling of the face/tongue/throat, SOB, or low  BP? No Did it involve sudden or severe rash/hives, skin peeling, or any reaction on the inside of your mouth or nose? Yes Did you need to seek medical attention at a hospital or doctor's office? No When did it last happen?  Over 30 years ago.     If all above answers are "NO", may proceed with cephalosporin use.   Rocephin [ceftriaxone] Rash        Medication List     TAKE these medications    acarbose 100 MG tablet Commonly known as: PRECOSE Take 100 mg by mouth 3 (three) times daily with meals.   acetaminophen 500 MG tablet Commonly known as: TYLENOL Take 500  mg by mouth every 6 (six) hours as needed.   calcium carbonate 1250 (500 Ca) MG tablet Commonly known as: OS-CAL - dosed in mg of elemental calcium Take 1 tablet by mouth daily.   cholecalciferol 25 MCG (1000 UNIT) tablet Commonly known as: VITAMIN D3 Take 2,000 Units by mouth daily.   diphenhydrAMINE 25 MG tablet Commonly known as: Benadryl Allergy Take 1 tablet (25 mg total) by mouth every 6 (six) hours as needed. Itching that does not improve with benadryl or other methods   DULoxetine 30 MG capsule Commonly known as: CYMBALTA Take 30 mg by mouth daily.   EPINEPHrine 0.3 mg/0.3 mL Soaj injection Commonly known as: EpiPen 2-Pak Inject 0.3 mLs (0.3 mg total) into the muscle once as needed (for severe allergic reaction). CAll 911 immediately if you have to use this medicine   fluticasone 50 MCG/ACT nasal spray Commonly known as: FLONASE Place 1 spray into both nostrils daily as needed for allergies.   gabapentin 300 MG capsule Commonly known as: NEURONTIN Take 300 mg by mouth at bedtime.   hydrOXYzine 10 MG tablet Commonly known as: ATARAX Take 1 tablet (10 mg total) by mouth 3 (three) times daily as needed.   loratadine 10 MG tablet Commonly known as: CLARITIN Take 10 mg by mouth daily as needed for allergies.   LORazepam 0.5 MG tablet Commonly known as: ATIVAN Take 0.5 mg by mouth every 6 (six) hours as needed for anxiety.   magnesium 30 MG tablet Take 60 mg by mouth at bedtime.   oxyCODONE 5 MG immediate release tablet Commonly known as: Oxy IR/ROXICODONE Take 1 tablet (5 mg total) by mouth every 6 (six) hours as needed for severe pain (pain score 7-10).   potassium chloride 10 MEQ CR capsule Commonly known as: MICRO-K Take 10 mEq by mouth 2 (two) times daily.   predniSONE 5 MG (48) Tbpk tablet Commonly known as: STERAPRED UNI-PAK 48 TAB Take 4 tablets (20 mg total) by mouth in the morning, at noon, and at bedtime for 1 day, THEN 4 tablets (20 mg total) 2  (two) times daily for 1 day, THEN 2 tablets (10 mg total) 3 (three) times daily for 1 day, THEN 2 tablets (10 mg total) 2 (two) times daily for 1 day, THEN 2 tablets (10 mg total) daily for 1 day, THEN 1 tablet (5 mg total) daily for 5 days. Start taking on: October 22, 2023   triamcinolone ointment 0.1 % Commonly known as: KENALOG Apply 1 Application topically 2 (two) times daily.        Follow-up Information     [facility] Follow up in 2 week(s).   Specialty: General Surgery Contact information: [address] [place] [address] [phone]         Riley Nearing,  Rafaela, MD Follow up.   Specialty: Family Medicine Contact information: [address] [place] [address] [phone]                 Signed: Berna Bue 10/22/2023, 9:36 AM

## 2023-10-22 NOTE — Progress Notes (Signed)
 Discharge orders reviewed with patient and all questions answered. Pt verbalized understanding. Pt left in stable condition with all belongings.

## 2023-10-24 ENCOUNTER — Telehealth: Payer: Self-pay

## 2023-10-24 LAB — CULTURE, BLOOD (ROUTINE X 2)
Culture: NO GROWTH
Culture: NO GROWTH

## 2023-10-24 NOTE — Transitions of Care (Post Inpatient/ED Visit) (Signed)
   10/24/2023  Name: Lori King MRN: [id number] DOB: 1965-09-22  Today's TOC FU Call Status: Today's TOC FU Call Status:: Unsuccessful Call (1st Attempt) Unsuccessful Call (1st Attempt) Date: 10/24/23  Attempted to reach the patient regarding the most recent Inpatient/ED visit.  Follow Up Plan: Additional outreach attempts will be made to reach the patient to complete the Transitions of Care (Post Inpatient/ED visit) call.   Signature Karena Addison, LPN [facility] Nurse Health Advisor Direct Dial [phone]

## 2023-10-25 ENCOUNTER — Encounter: Payer: Self-pay | Admitting: Family Medicine

## 2023-10-25 NOTE — Telephone Encounter (Signed)
 Transition Care Management Follow-up Telephone Call Date of discharge and from where:  How have you been since you were released from the hospital? 10-18-2023  [facility] Any questions or concerns? Yes  Items Reviewed: Did the pt receive and understand the discharge instructions provided? Yes  Medications obtained and verified? Yes  Other? Yes  Any new allergies since your discharge? Yes  Dietary orders reviewed? No Do you have support at home? Yes   Home Care and Equipment/Supplies: Were home health services ordered? no If so, what is the name of the agency?   Has the agency set up a time to come to the patient's home? no Were any new equipment or medical supplies ordered?  No What is the name of the medical supply agency?  Were you able to get the supplies/equipment? no Do you have any questions related to the use of the equipment or supplies? No  Functional Questionnaire: (I = Independent and D = Dependent)   ADLs: I  Bathing/Dressing- I  Meal Prep- I  Eating- I  Maintaining continence- I  Transferring/Ambulation- I  Managing Meds- I  Follow up appointments reviewed:  Patient has left a message regarding rash, she has a scheduled appointment with Dermatology 10-26-2023     Was advised on symptoms on when to return for emergency visit regarding rash.  PCP Hospital f/u appt confirmed? Yes   Specialist Hospital f/u appt confirmed? Yes  Scheduled to see 10-26-2023 . Are transportation arrangements needed? No  If their condition worsens, is the pt aware to call PCP or go to the Emergency Dept.? Yes Was the patient provided with contact information for the PCP's office or ED? Yes Was to pt encouraged to call back with questions or concerns? Yes

## 2023-10-26 ENCOUNTER — Ambulatory Visit: Payer: BC Managed Care – PPO | Admitting: Dermatology

## 2023-10-26 ENCOUNTER — Encounter: Payer: Self-pay | Admitting: Dermatology

## 2023-10-26 VITALS — BP 93/63

## 2023-10-26 DIAGNOSIS — Z9049 Acquired absence of other specified parts of digestive tract: Secondary | ICD-10-CM

## 2023-10-26 DIAGNOSIS — L270 Generalized skin eruption due to drugs and medicaments taken internally: Secondary | ICD-10-CM | POA: Diagnosis not present

## 2023-10-26 DIAGNOSIS — L817 Pigmented purpuric dermatosis: Secondary | ICD-10-CM

## 2023-10-26 DIAGNOSIS — R21 Rash and other nonspecific skin eruption: Secondary | ICD-10-CM

## 2023-10-26 MED ORDER — CLOBETASOL PROPIONATE 0.05 % EX CREA: 1.0000 | TOPICAL_CREAM | Freq: Two times a day (BID) | CUTANEOUS | 1 refills | Status: DC

## 2023-10-26 NOTE — Patient Instructions (Addendum)
 Hello Lori King,  Thank you for visiting [place] today.   Here is a summary of the key instructions and next steps from today's consultation:  Biopsy: We are scheduled to perform two biopsies to investigate the possibility of vasculitis, which may be related to DRESS syndrome. The procedure will include:   Numbing: Local anesthesia with lidocaine.   Procedure: Taking samples and securing the sites with two sutures each.  Medications:   Prednisone: Continue tapering your dosage. Should symptoms exacerbate at a 10 milligram dosage, please contact our office for a possible dosage adjustment.   Benadryl: Utilize as needed for nighttime itching relief.   CeraVe Anti-Itch and Clobetasol: Combine and apply to the affected areas twice daily. Note: Clobetasol is to be used for a duration of two weeks only.  Lab Work: Additional tests will be conducted to explore other potential causes of vasculitis and to monitor your liver function.  Post-Biopsy Care: It is important to keep the biopsy sites dry during showers. After showering, gently pat the areas dry, then apply Aquaphor and cover with a Band-Aid.  Follow-Up Appointment: Please schedule a follow-up visit in two weeks. During this appointment, we will review the biopsy results and evaluate your progress. Treatment adjustments may be made based on these findings.  Please ensure to update your allergy list to include cephalosporin and Rocephin, and continue to avoid these medications. Should you have any concerns or notice any changes in your symptoms before our next meeting, do not hesitate to reach out to our office.  Warm regards,  Dr. Langston Reusing, Dermatology     Patient Handout: Wound Care for Skin Biopsy Site  Taking Care of Your Skin Biopsy Site  Proper care of the biopsy site is essential for promoting healing and minimizing scarring. This handout provides instructions on how to care for your biopsy site to ensure optimal recovery.  1.  Cleaning the Wound:  Clean the biopsy site daily with gentle soap and water. Gently pat the area dry with a clean, soft towel. Avoid harsh scrubbing or rubbing the area, as this can irritate the skin and delay healing.  2. Applying Aquaphor and Bandage:  After cleaning the wound, apply a thin layer of Aquaphor ointment to the biopsy site. Cover the area with a sterile bandage to protect it from dirt, bacteria, and friction. Change the bandage daily or as needed if it becomes soiled or wet.  3. Continued Care for One Week:  Repeat the cleaning, Aquaphor application, and bandaging process daily for one week following the biopsy procedure. Keeping the wound clean and moist during this initial healing period will help prevent infection and promote optimal healing.  4. Massaging Aquaphor into the Area:  ---After one week, discontinue the use of bandages but continue to apply Aquaphor to the biopsy site. ----Gently massage the Aquaphor into the area using circular motions. ---Massaging the skin helps to promote circulation and prevent the formation of scar tissue.   Additional Tips:  Avoid exposing the biopsy site to direct sunlight during the healing process, as this can cause hyperpigmentation or worsen scarring. If you experience any signs of infection, such as increased redness, swelling, warmth, or drainage from the wound, contact your healthcare provider immediately. Follow any additional instructions provided by your healthcare provider for caring for the biopsy site and managing any discomfort. Conclusion:  Taking proper care of your skin biopsy site is crucial for ensuring optimal healing and minimizing scarring. By following these instructions for cleaning, applying Aquaphor,  and massaging the area, you can promote a smooth and successful recovery. If you have any questions or concerns about caring for your biopsy site, don't hesitate to contact your healthcare provider for  guidance.     Important Information  Due to recent changes in healthcare laws, you may see results of your pathology and/or laboratory studies on MyChart before the doctors have had a chance to review them. We understand that in some cases there may be results that are confusing or concerning to you. Please understand that not all results are received at the same time and often the doctors may need to interpret multiple results in order to provide you with the best plan of care or course of treatment. Therefore, we ask that you please give [place] 2 business days to thoroughly review all your results before contacting the office for clarification. Should we see a critical lab result, you will be contacted sooner.   If You Need Anything After Your Visit  If you have any questions or concerns for your doctor, please call our main line at [phone] If no one answers, please leave a voicemail as directed and we will return your call as soon as possible. Messages left after 4 pm will be answered the following business day.   You may also send [place] a message via MyChart. We typically respond to MyChart messages within 1-2 business days.  For prescription refills, please ask your pharmacy to contact our office. Our fax number is [phone].  If you have an urgent issue when the clinic is closed that cannot wait until the next business day, you can page your doctor at the number below.    Please note that while we do our best to be available for urgent issues outside of office hours, we are not available 24/7.   If you have an urgent issue and are unable to reach [place], you may choose to seek medical care at your doctor's office, retail clinic, urgent care center, or emergency room.  If you have a medical emergency, please immediately call 911 or go to the emergency department. In the event of inclement weather, please call our main line at [phone] for an update on the status of any delays or  closures.  Dermatology Medication Tips: Please keep the boxes that topical medications come in in order to help keep track of the instructions about where and how to use these. Pharmacies typically print the medication instructions only on the boxes and not directly on the medication tubes.   If your medication is too expensive, please contact our office at [phone] or send [place] a message through MyChart.   We are unable to tell what your co-pay for medications will be in advance as this is different depending on your insurance coverage. However, we may be able to find a substitute medication at lower cost or fill out paperwork to get insurance to cover a needed medication.   If a prior authorization is required to get your medication covered by your insurance company, please allow [place] 1-2 business days to complete this process.  Drug prices often vary depending on where the prescription is filled and some pharmacies may offer cheaper prices.  The website www.goodrx.com contains coupons for medications through different pharmacies. The prices here do not account for what the cost may be with help from insurance (it may be cheaper with your insurance), but the website can give you the price if you did not use any insurance.  -  You can print the associated coupon and take it with your prescription to the pharmacy.  - You may also stop by our office during regular business hours and pick up a GoodRx coupon card.  - If you need your prescription sent electronically to a different pharmacy, notify our office through [facility] or by phone at [phone]

## 2023-10-26 NOTE — Progress Notes (Signed)
 New Patient Visit   Subjective  Lori King is a 58 y.o. female who presents for the following: New Pt - Rash  Patient states she has rash located at the scattered that she would like to have examined. Patient reports the areas have been there for a week and a half prior  after stating an Abx in preporation to gallbladder surgery. She reports the areas are bothersome.Patient rates irritation (itchy)4 out of 10. She states that the areas have spread as it started on her R upper arm them spread. Patient reports she has previously been treated for these areas at hospital and was given prednisone, hydroxyzine and benadryl. Patient denied Hx of bx. Patient denied family history of skin cancer(s).   The following portions of the chart were reviewed this encounter and updated as appropriate: medications, allergies, medical history  Review of Systems:  No other skin or systemic complaints except as noted in HPI or Assessment and Plan.  Objective  Well appearing patient in no apparent distress; mood and affect are within normal limits.  .  A focused examination was performed of the following areas: scattered   Relevant exam findings are noted in the Assessment and Plan.                Assessment & Plan   Drug-Induced Rash (Suspected Rocephin-Induced)  Assessment: Patient developed a severe rash after receiving Rocephin post-cholecystectomy. The rash initiated on the arms and extended to the chest, body, palms, and soles. Examination revealed a diffuse morbilliform rash, general erythema with petechiae, and non-palpable purpura, including palpebral purpura. Differential diagnoses include drug-reaction vasculitis and DRESS/DIHS syndrome. The condition is showing improvement with prednisone.  Plan:   Conduct two skin biopsies to confirm vasculitis.   Continue the prednisone taper, currently at 20mg .   Increase prednisone dose if rash exacerbates at 10mg .   Apply a mixture of CeraVe  Anti-Itch and clobetasol cream twice daily for two weeks.   Use Benadryl as needed for itching.   Add cephalosporin (Rocephin) to the patient's allergy list.   Order laboratory tests to explore other causes of vasculitis and reassess liver functions.   Schedule a follow-up appointment in 2 weeks.   Advise against bathing, showers allowed.   Apply Aquaphor and Band-Aid to biopsy sites post-showering.  Post-Cholecystectomy Status  Assessment: Patient underwent cholecystectomy due to symptoms and signs suggestive of gallstones, including abdominal pain, fevers, chills, and elevated liver enzymes. The post-operative course was complicated by a drug-induced rash and fever, necessitating readmission.  Plan:   Monitor for any post-operative complications.   Ensure follow-up with the surgeon as previously scheduled.  Pre-visit planning reviewing the last office visit, labs, imaging and care everywhere when applicable was 30 minutes Intra-visit was 30 minutes and included updating the relevant history, performing a video or in-person physical exam as appropriate, creating a treatment plan and used shared decision making with the patient.  Post-visit was 15 minutes that encompassed note completion, placing of orders, updating patient instructions and coordination of care.   RASH Right Ankle - Anterior, Right Thigh - Anterior Skin / nail biopsy - Right Ankle - Anterior, Right Thigh - Anterior Type of biopsy: punch   Informed consent: discussed and consent obtained   Timeout: patient name, date of birth, surgical site, and procedure verified   Procedure prep:  Patient was prepped and draped in usual sterile fashion Prep type:  Isopropyl alcohol Anesthesia: the lesion was anesthetized in a standard fashion   Anesthetic:  1% lidocaine w/ epinephrine 1-100,000 buffered w/ 8.4% NaHCO3 Punch size:  4 mm Suture size:  4-0 Suture type: Prolene (polypropylene)   Hemostasis achieved with: suture and  aluminum chloride   Outcome: patient tolerated procedure well   Post-procedure details: sterile dressing applied and wound care instructions given   Dressing type: petrolatum gauze   Specimen 1 - Surgical pathology Differential Diagnosis: r/o drug reaction vasculitis vs other  Check Margins: yes  Specimen 2 - Surgical pathology Differential Diagnosis: r/o drug reaction vasculitis vs other  Check Margins: yes  Return in about 2 weeks (around 11/09/2023) for Bx results.    Documentation: I have reviewed the above documentation for accuracy and completeness, and I agree with the above.  I, Shirron Marcha Solders, CMA, am acting as scribe for [employer], DO.   Langston Reusing, DO

## 2023-10-27 ENCOUNTER — Telehealth: Payer: Self-pay

## 2023-10-27 DIAGNOSIS — R21 Rash and other nonspecific skin eruption: Secondary | ICD-10-CM | POA: Diagnosis not present

## 2023-10-27 DIAGNOSIS — O88219 Thromboembolism in pregnancy, unspecified trimester: Secondary | ICD-10-CM | POA: Diagnosis not present

## 2023-10-27 LAB — SURGICAL PATHOLOGY

## 2023-10-27 NOTE — Transitions of Care (Post Inpatient/ED Visit) (Signed)
 10/27/2023  Name: Lori King MRN: [id number] DOB: Apr 10, 1966  Today's TOC FU Call Status: Today's TOC FU Call Status:: Successful TOC FU Call Completed TOC FU Call Complete Date: 10/27/23 Patient's Name and Date of Birth confirmed.  Transition Care Management Follow-up Telephone Call Date of Discharge: 10/22/23 Discharge Facility: [facility]) Type of Discharge: Inpatient Admission Primary Inpatient Discharge Diagnosis:: [place] How have you been since you were released from the hospital?: Same Any questions or concerns?: No  Items Reviewed: Did you receive and understand the discharge instructions provided?: Yes Medications obtained,verified, and reconciled?: Yes (Medications Reviewed) Any new allergies since your discharge?: No Dietary orders reviewed?: Yes Do you have support at home?: Yes People in Home: spouse  Medications Reviewed Today: Medications Reviewed Today     Reviewed by Karena Addison, LPN (Licensed Practical Nurse) on 10/27/23 at 1027  Med List Status: <None>   Medication Order Taking? Sig Documenting Provider Last Dose Status Informant  acarbose (PRECOSE) 100 MG tablet [id number] No Take 100 mg by mouth 3 (three) times daily with meals. [provider] Taking Active Self, Nursing Home Medication Administration Guide (MAG)  acetaminophen (TYLENOL) 500 MG tablet [id number] No Take 500 mg by mouth every 6 (six) hours as needed. [provider] Taking Active Self, Nursing Home Medication Administration Guide (MAG)  calcium carbonate (OS-CAL - DOSED IN MG OF ELEMENTAL CALCIUM) 1250 (500 Ca) MG tablet [id number] No Take 1 tablet by mouth daily. [provider] Taking Active Self, Nursing Home Medication Administration Guide (MAG)  cholecalciferol (VITAMIN D3) 25 MCG (1000 UNIT) tablet [id number] No Take 2,000 Units by mouth daily. [provider] Taking Active Self, Nursing Home Medication Administration Guide (MAG)   clobetasol cream (TEMOVATE) 0.05 % [id number]  Apply 1 Application topically 2 (two) times daily. Terri Piedra, DO  Active   diphenhydrAMINE (BENADRYL ALLERGY) 25 MG tablet [id number] No Take 1 tablet (25 mg total) by mouth every 6 (six) hours as needed. Itching that does not improve with benadryl or other methods Berna Bue, MD Taking Active   DULoxetine (CYMBALTA) 30 MG capsule [id number] No Take 30 mg by mouth daily. [provider] Taking Active Self, Nursing Home Medication Administration Guide (MAG)  EPINEPHrine (EPIPEN 2-PAK) 0.3 mg/0.3 mL IJ SOAJ injection [id number] No Inject 0.3 mLs (0.3 mg total) into the muscle once as needed (for severe allergic reaction). CAll 911 immediately if you have to use this medicine Garlon Hatchet, PA-C Taking Active Self, Nursing Home Medication Administration Guide (MAG)  fluticasone (FLONASE) 50 MCG/ACT nasal spray [id number] No Place 1 spray into both nostrils daily as needed for allergies. [provider] Taking Active Self, Nursing Home Medication Administration Guide (MAG)  gabapentin (NEURONTIN) 300 MG capsule [id number] No Take 300 mg by mouth at bedtime. [provider] Taking Active Self, Nursing Home Medication Administration Guide (MAG)  hydrOXYzine (ATARAX) 10 MG tablet [id number] No Take 1 tablet (10 mg total) by mouth 3 (three) times daily as needed. Berna Bue, MD Taking Active   loratadine (CLARITIN) 10 MG tablet [id number] No Take 10 mg by mouth daily as needed for allergies. [provider] Taking Active Self, Nursing Home Medication Administration Guide (MAG)           Med Note Tresa Garter D   Tue Oct 18, 2023 12:15 PM) unknown  LORazepam (ATIVAN) 0.5 MG tablet [id number] No Take 0.5 mg by mouth every 6 (six) hours as needed for anxiety. [provider]  Taking Active Self, Nursing Home Medication Administration Guide (MAG)           Med Note Sherlon Handing, CHANELLE D   Tue Oct 18, 2023 12:14 PM) unknown  magnesium 30 MG tablet [id number] No Take 60 mg by mouth at bedtime. [provider] Taking Active Self, Nursing Home Medication Administration Guide (MAG)  potassium chloride (MICRO-K) 10 MEQ CR capsule [id number] No Take 10 mEq by mouth 2 (two) times daily. [provider] Taking Active Self, Nursing Home Medication Administration Guide (MAG)  predniSONE (STERAPRED UNI-PAK 48 TAB) 5 MG (48) TBPK tablet [id number] No Take 4 tablets (20 mg total) by mouth in the morning, at noon, and at bedtime for 1 day, THEN 4 tablets (20 mg total) 2 (two) times daily for 1 day, THEN 2 tablets (10 mg total) 3 (three) times daily for 1 day, THEN 2 tablets (10 mg total) 2 (two) times daily for 1 day, THEN 2 tablets (10 mg total) daily for 1 day, THEN 1 tablet (5 mg total) daily for 5 days. Berna Bue, MD Taking Active   triamcinolone ointment (KENALOG) 0.1 % [id number] No Apply 1 Application topically 2 (two) times daily. [provider] Taking Active Self, Nursing Home Medication Administration Guide (MAG)            Home Care and Equipment/Supplies: Were Home Health Services Ordered?: NA Any new equipment or medical supplies ordered?: NA  Functional Questionnaire: Do you need assistance with bathing/showering or dressing?: No Do you need assistance with meal preparation?: No Do you need assistance with eating?: No Do you have difficulty maintaining continence: No Do you need assistance with getting out of bed/getting out of a chair/moving?: No Do you have difficulty managing or taking your medications?: No  Follow up appointments reviewed: PCP Follow-up appointment confirmed?: Yes Date of PCP follow-up appointment?: 10/31/23 Follow-up Provider: [facility] Follow-up appointment confirmed?: Yes Date of Specialist follow-up appointment?: 10/26/23 Follow-Up Specialty Provider:: Derm Do you need transportation to your follow-up  appointment?: No Do you understand care options if your condition(s) worsen?: Yes-patient verbalized understanding    SIGNATURE Karena Addison, LPN [facility] Advisor Direct Dial [phone]

## 2023-10-29 LAB — COMPREHENSIVE METABOLIC PANEL
ALT: 201 IU/L — ABNORMAL HIGH (ref 0–32)
AST: 38 IU/L (ref 0–40)
Albumin: 3.7 g/dL — ABNORMAL LOW (ref 3.8–4.9)
Alkaline Phosphatase: 259 IU/L — ABNORMAL HIGH (ref 44–121)
BUN/Creatinine Ratio: 26 — ABNORMAL HIGH (ref 9–23)
BUN: 20 mg/dL (ref 6–24)
Bilirubin Total: 0.7 mg/dL (ref 0.0–1.2)
CO2: 24 mmol/L (ref 20–29)
Calcium: 8.8 mg/dL (ref 8.7–10.2)
Chloride: 97 mmol/L (ref 96–106)
Creatinine, Ser: 0.78 mg/dL (ref 0.57–1.00)
Globulin, Total: 3 g/dL (ref 1.5–4.5)
Glucose: 83 mg/dL (ref 70–99)
Potassium: 4.6 mmol/L (ref 3.5–5.2)
Sodium: 135 mmol/L (ref 134–144)
Total Protein: 6.7 g/dL (ref 6.0–8.5)
eGFR: 90 mL/min/{1.73_m2} (ref >59)

## 2023-10-29 LAB — CBC WITH DIFFERENTIAL/PLATELET
Basophils Absolute: 0.1 10*3/uL (ref 0.0–0.2)
Basos: 1 %
EOS (ABSOLUTE): 0.4 10*3/uL (ref 0.0–0.4)
Eos: 3 %
Hematocrit: 35.9 % (ref 34.0–46.6)
Hemoglobin: 12.5 g/dL (ref 11.1–15.9)
Immature Grans (Abs): 0.4 10*3/uL — ABNORMAL HIGH (ref 0.0–0.1)
Immature Granulocytes: 3 %
Lymphocytes Absolute: 3.9 10*3/uL — ABNORMAL HIGH (ref 0.7–3.1)
Lymphs: 28 %
MCH: 31.7 pg (ref 26.6–33.0)
MCHC: 34.8 g/dL (ref 31.5–35.7)
MCV: 91 fL (ref 79–97)
Monocytes Absolute: 0.6 10*3/uL (ref 0.1–0.9)
Monocytes: 5 %
Neutrophils Absolute: 8.4 10*3/uL — ABNORMAL HIGH (ref 1.4–7.0)
Neutrophils: 60 %
Platelets: 425 10*3/uL (ref 150–450)
RBC: 3.94 x10E6/uL (ref 3.77–5.28)
RDW: 13.7 % (ref 11.7–15.4)
WBC: 13.8 10*3/uL — ABNORMAL HIGH (ref 3.4–10.8)

## 2023-10-29 LAB — LACTATE DEHYDROGENASE: LDH: 222 IU/L (ref 119–226)

## 2023-10-29 LAB — ANCA TITERS
Atypical pANCA: <1:20 {titer}
C-ANCA: <1:20 {titer}
P-ANCA: <1:20 {titer}

## 2023-10-29 LAB — C-REACTIVE PROTEIN: CRP: 2 mg/L (ref 0–10)

## 2023-10-29 LAB — FERRITIN: Ferritin: 412 ng/mL — ABNORMAL HIGH (ref 15–150)

## 2023-10-29 LAB — CMV ABS, IGG+IGM (CYTOMEGALOVIRUS)
CMV Ab - IgG: <0.6 U/mL (ref 0.00–0.59)
CMV IgM Ser EIA-aCnc: <30 [AU]/ml (ref 0.0–29.9)

## 2023-10-29 LAB — HUMAN HERPES VIRUS TYPE 6 IGM: Human Herpes Virus Type 6 IgM: <1:10 {titer}

## 2023-10-31 ENCOUNTER — Encounter: Payer: Self-pay | Admitting: Family Medicine

## 2023-10-31 ENCOUNTER — Ambulatory Visit: Payer: BC Managed Care – PPO | Admitting: Family Medicine

## 2023-10-31 VITALS — BP 104/60 | HR 76 | Temp 98.7°F | Ht 62.0 in | Wt 133.0 lb

## 2023-10-31 DIAGNOSIS — T7840XA Allergy, unspecified, initial encounter: Secondary | ICD-10-CM | POA: Insufficient documentation

## 2023-10-31 DIAGNOSIS — T7840XD Allergy, unspecified, subsequent encounter: Secondary | ICD-10-CM

## 2023-10-31 DIAGNOSIS — R748 Abnormal levels of other serum enzymes: Secondary | ICD-10-CM | POA: Diagnosis not present

## 2023-10-31 DIAGNOSIS — I2510 Atherosclerotic heart disease of native coronary artery without angina pectoris: Secondary | ICD-10-CM | POA: Insufficient documentation

## 2023-10-31 DIAGNOSIS — R7989 Other specified abnormal findings of blood chemistry: Secondary | ICD-10-CM | POA: Diagnosis not present

## 2023-10-31 DIAGNOSIS — Z9049 Acquired absence of other specified parts of digestive tract: Secondary | ICD-10-CM | POA: Insufficient documentation

## 2023-10-31 DIAGNOSIS — I471 Supraventricular tachycardia, unspecified: Secondary | ICD-10-CM | POA: Diagnosis not present

## 2023-10-31 DIAGNOSIS — E785 Hyperlipidemia, unspecified: Secondary | ICD-10-CM | POA: Diagnosis not present

## 2023-10-31 DIAGNOSIS — Z09 Encounter for follow-up examination after completed treatment for conditions other than malignant neoplasm: Secondary | ICD-10-CM | POA: Diagnosis not present

## 2023-10-31 DIAGNOSIS — I700 Atherosclerosis of aorta: Secondary | ICD-10-CM | POA: Diagnosis not present

## 2023-10-31 NOTE — Progress Notes (Unsigned)
 Patient Office Visit  Assessment & Plan:  Elevated liver enzymes -     CBC with Differential/Platelet -     Ferritin -     COMPLETE METABOLIC PANEL WITH GFR  Allergic reaction to drug, subsequent encounter -     CBC with Differential/Platelet -     Ferritin -     COMPLETE METABOLIC PANEL WITH GFR  Hx of cholecystectomy    No follow-ups on file.   Subjective:    Patient ID: Lori King, female    DOB: 1966/06/18  Age: 58 y.o. MRN: [id number]  Chief Complaint  Patient presents with   Hospitalization Follow-up    F/u from hospitalization from Gallbladder surgery and allergic reaction to Rocephin.     HPI Elevated LFTs-patient had a lap chole in [facility] on February 15 and subsequently had high fever with nausea vomiting and increasing pain.  Patient had elevated LFTs in the 300 range but last week ALT went to 201 AST to 38.  Patient also had an MRCP to make sure that she does not have a either bile leak or retained gallstone.  e.  Patient did not have this.  Blood cultures were negative.  Patient has not had any fever chills nausea vomiting but continues to be very fatigued.  Patient's appetite has increased in the last few days and is eating better.  Patient having normal bowel movements. Severe allergic reaction to Rocephin-subsequently she developed severe diffuse rash from neck to her feet.  The rash was very red and extremely itchy.  Patient was given prescription prednisone hydroxyzine and steroid cream to use with OTC moisturizer Cerave..  Patient saw dermatology last Wednesday who thought that she may have Dress syndrome or possible vasculitis  Was told to continue this regimen and skin biopsy was performed which did not reveal autoimmune disease as well as extensive labs which ruled out Wegener's and vasculitis.  Patient has a follow-up with dermatology next week.  Patient continues on prednisone 5 mg once a day and has 6 more days.  Patient is concerned that she may  not have enough prednisone.  The rash is much better not as red but is still extremely itchy. Labs negative for vasculitis, Ferritin elevated but CRP normal range.  Per Dermatology  Medications:   Prednisone: Continue tapering your dosage. Should symptoms exacerbate at a 10 milligram dosage, please contact our office for a possible dosage adjustment.   Benadryl: Utilize as needed for nighttime itching relief.   CeraVe Anti-Itch and Clobetasol: Combine and apply to the affected areas twice daily. Note: Clobetasol is to be used for a duration of two weeks only. Elevated chol- pt did see Dr. Karren Burly in [place] today and was told she should start a low dose chol med such as Lipitor 5mg . Pt has follow up in one year. Pt wants to get over this allergic reaction/rash issue first.  The 10-year ASCVD risk score (Arnett DK, et al., 2019) is: 1.2%  Past Medical History:  Diagnosis Date   Adhesive capsulitis of right shoulder    Allergic rhinitis, seasonal    Allergy    Anemia    Anxiety    MDD (major depressive disorder)    Wears contact lenses    Past Surgical History:  Procedure Laterality Date   BREAST BIOPSY Left 2010   x2 per pt benign   CESAREAN SECTION  X2  last one 02-28-2001   W/  BILATERAL TUBAL LIGATION w/ last c/s  CHOLECYSTECTOMY N/A 10/15/2023   Procedure: LAPAROSCOPIC CHOLECYSTECTOMY WITH INTRAOPERATIVE CHOLANGIOGRAM AND ICG DYE;  Surgeon: Gaynelle Adu, MD;  Location: [facility];  Service: General;  Laterality: N/A;   COLONOSCOPY  11/01/2017   HYSTEROSCOPY W/ ENDOMETRIAL ABLATION  2019   LUMBAR LAMINECTOMY/DECOMPRESSION MICRODISCECTOMY  2013   L4  --- S1   SHOULDER ARTHROSCOPY Right 08/28/2020   Procedure: ARTHROSCOPY SHOULDER lysis of adhesions and manipulation under anesthesia, extensive debridement;  Surgeon: Yolonda Kida, MD;  Location: [facility];  Service: Orthopedics;  Laterality: Right;   SPINE SURGERY     TUBAL LIGATION     Social History   Tobacco  Use   Smoking status: Never   Smokeless tobacco: Never  Vaping Use   Vaping status: Never Used  Substance Use Topics   Alcohol use: Not Currently   Drug use: Never   Family History  Problem Relation Age of Onset   Breast cancer Mother 62   Atrial fibrillation Mother    Breast cancer Sister 24   Allergies  Allergen Reactions   Other Anaphylaxis    ALL TYPES OF FISH   Rocephin [Ceftriaxone] Rash   Shellfish Allergy Anaphylaxis    ALL SHELLFISH   Cephalosporins Rash   Ciprofloxacin Rash   Doxycycline Itching   Keflex [Cephalexin] Rash   Merrem [Meropenem]     Possible rash vs related to prior abx (Rocephin)   Penicillins Rash    Did it involve swelling of the face/tongue/throat, SOB, or low BP? No Did it involve sudden or severe rash/hives, skin peeling, or any reaction on the inside of your mouth or nose? Yes Did you need to seek medical attention at a hospital or doctor's office? No When did it last happen?  Over 30 years ago.     If all above answers are "NO", may proceed with cephalosporin use.      ROS    Objective:    BP 104/60   Pulse 76   Temp 98.7 F (37.1 C)   Ht 5\' 2"  (1.575 m)   Wt 133 lb (60.3 kg)   SpO2 99%   BMI 24.33 kg/m  BP Readings from Last 3 Encounters:  10/31/23 104/60  10/26/23 93/63  10/22/23 104/68   Wt Readings from Last 3 Encounters:  10/31/23 133 lb (60.3 kg)  10/18/23 136 lb 4.8 oz (61.8 kg)  02/13/23 130 lb (59 kg)   Pt comes in with her sister.  Physical Exam Vitals and nursing note reviewed.  Constitutional:      Appearance: Normal appearance.  HENT:     Head: Normocephalic.     Right Ear: Tympanic membrane, ear canal and external ear normal.     Left Ear: Tympanic membrane, ear canal and external ear normal.  Eyes:     Extraocular Movements: Extraocular movements intact.     Conjunctiva/sclera: Conjunctivae normal.     Pupils: Pupils are equal, round, and reactive to light.  Cardiovascular:     Rate and Rhythm:  Normal rate and regular rhythm.     Heart sounds: Normal heart sounds.  Pulmonary:     Effort: Pulmonary effort is normal.     Breath sounds: Normal breath sounds.  Abdominal:     General: Bowel sounds are normal.     Tenderness: There is no abdominal tenderness. There is no guarding.     Comments: Surgical incisions from lap chole are healing well, no redness, no streaking.   Musculoskeletal:     Right  lower leg: No edema.     Left lower leg: No edema.  Skin:    Findings: Erythema and rash present. No bruising. Rash is papular, purpuric and urticarial. Rash is not pustular, scaling or vesicular.  Neurological:     General: No focal deficit present.     Mental Status: She is alert and oriented to person, place, and time.  Psychiatric:        Mood and Affect: Mood normal.        Behavior: Behavior normal.        Thought Content: Thought content normal.        Judgment: Judgment normal.      Results for orders placed or performed in visit on 10/31/23  HM PAP SMEAR  Result Value Ref Range   HM Pap smear normal     {Labs (Optional):23779}

## 2023-11-01 ENCOUNTER — Encounter: Payer: Self-pay | Admitting: Family Medicine

## 2023-11-01 ENCOUNTER — Encounter: Payer: Self-pay | Admitting: Dermatology

## 2023-11-01 ENCOUNTER — Telehealth: Payer: Self-pay

## 2023-11-01 DIAGNOSIS — Z09 Encounter for follow-up examination after completed treatment for conditions other than malignant neoplasm: Secondary | ICD-10-CM | POA: Insufficient documentation

## 2023-11-01 DIAGNOSIS — R7989 Other specified abnormal findings of blood chemistry: Secondary | ICD-10-CM | POA: Insufficient documentation

## 2023-11-01 DIAGNOSIS — R5383 Other fatigue: Secondary | ICD-10-CM

## 2023-11-01 LAB — COMPLETE METABOLIC PANEL WITH GFR
AG Ratio: 1.2 (calc) (ref 1.0–2.5)
ALT: 95 U/L — ABNORMAL HIGH (ref 6–29)
AST: 22 U/L (ref 10–35)
Albumin: 3.8 g/dL (ref 3.6–5.1)
Alkaline phosphatase (APISO): 147 U/L (ref 37–153)
BUN/Creatinine Ratio: 40 (calc) — ABNORMAL HIGH (ref 6–22)
BUN: 26 mg/dL — ABNORMAL HIGH (ref 7–25)
CO2: 28 mmol/L (ref 20–32)
Calcium: 9 mg/dL (ref 8.6–10.4)
Chloride: 103 mmol/L (ref 98–110)
Creat: 0.65 mg/dL (ref 0.50–1.03)
Globulin: 3.1 g/dL (ref 1.9–3.7)
Glucose, Bld: 90 mg/dL (ref 65–99)
Potassium: 4.2 mmol/L (ref 3.5–5.3)
Sodium: 136 mmol/L (ref 135–146)
Total Bilirubin: 0.6 mg/dL (ref 0.2–1.2)
Total Protein: 6.9 g/dL (ref 6.1–8.1)
eGFR: 104 mL/min/{1.73_m2} (ref >=60)

## 2023-11-01 LAB — CBC WITH DIFFERENTIAL/PLATELET
Absolute Lymphocytes: 2697 {cells}/uL (ref 850–3900)
Absolute Monocytes: 588 {cells}/uL (ref 200–950)
Basophils Absolute: 100 {cells}/uL (ref 0–200)
Basophils Relative: 0.9 %
Eosinophils Absolute: 810 {cells}/uL — ABNORMAL HIGH (ref 15–500)
Eosinophils Relative: 7.3 %
HCT: 34.3 % — ABNORMAL LOW (ref 35.0–45.0)
Hemoglobin: 11.4 g/dL — ABNORMAL LOW (ref 11.7–15.5)
MCH: 30 pg (ref 27.0–33.0)
MCHC: 33.2 g/dL (ref 32.0–36.0)
MCV: 90.3 fL (ref 80.0–100.0)
MPV: 10 fL (ref 7.5–12.5)
Monocytes Relative: 5.3 %
Neutro Abs: 6904 {cells}/uL (ref 1500–7800)
Neutrophils Relative %: 62.2 %
Platelets: 396 10*3/uL (ref 140–400)
RBC: 3.8 10*6/uL (ref 3.80–5.10)
RDW: 13.7 % (ref 11.0–15.0)
Total Lymphocyte: 24.3 %
WBC: 11.1 10*3/uL — ABNORMAL HIGH (ref 3.8–10.8)

## 2023-11-01 LAB — FERRITIN: Ferritin: 170 ng/mL (ref 16–232)

## 2023-11-01 MED ORDER — PREDNISONE 5 MG (48) PO TBPK: ORAL_TABLET | ORAL | 0 refills | Status: AC

## 2023-11-01 NOTE — Progress Notes (Signed)
 I spoke with pt to review results and check the status of her rash.  She's dong better, less itchy and the rash is fading. 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  ANCA's were negative, WBC elevation most likely secondary to prednisone, ALK phos is slightly trending down yet, ALT is slightly trending up. I was concerned that the ferritin was elevated and pt is not on iron supplements.  This is indicative of acute liver inflammation.  Explained to pt that she will need to be on a longer steroid taper with this diagnosis especially while her LFT's are still high. CMV, and HHV6 both negative, however I will add on a EBV testing  Shirron can you please send a non visit order for a prednisone taper.  5mg  tablets, starting tomorrow she will take 4 tabs for 5 days, then 3 tabs for 5 days, then 2 tabs for 5 days and then 1 tab for 5 days.    Also please send an order to [facility] in [place] for EBV heterophile antibody, EBV VCA(viral capsid antigen) igG and IgM.    Thanks!  Dr. Onalee Hua

## 2023-11-01 NOTE — Telephone Encounter (Signed)
-----   Message from Langston Reusing sent at 11/01/2023  3:38 PM EST ----- I spoke with pt to review results and check the status of her rash.  She's dong better, less itchy and the rash is fading. 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  ANCA's were negative, WBC elevation most likely secondary to prednisone, ALK phos is slightly trending down yet, ALT is slightly trending up. I was concerned that the ferritin was elevated and pt is not on iron supplements.  This is indicative of acute liver inflammation.  Explained to pt that she will need to be on a longer steroid taper with this diagnosis especially while her LFT's are still high. CMV, and HHV6 both negative, however I will add on a EBV testing  Victor Langenbach can you please send a non visit order for a prednisone taper.  5mg  tablets, starting tomorrow she will take 4 tabs for 5 days, then 3 tabs for 5 days, then 2 tabs for 5 days and then 1 tab for 5 days.    Also please send an order to [facility] in [place] for EBV heterophile antibody, EBV VCA(viral capsid antigen) igG and IgM.    Thanks!  Dr. Onalee Hua

## 2023-11-02 DIAGNOSIS — F4310 Post-traumatic stress disorder, unspecified: Secondary | ICD-10-CM | POA: Diagnosis not present

## 2023-11-02 DIAGNOSIS — R5383 Other fatigue: Secondary | ICD-10-CM | POA: Diagnosis not present

## 2023-11-02 DIAGNOSIS — F329 Major depressive disorder, single episode, unspecified: Secondary | ICD-10-CM | POA: Diagnosis not present

## 2023-11-03 LAB — EBV AB TO VIRAL CAPSID AG PNL, IGG+IGM
EBV VCA IgG: 124 U/mL — ABNORMAL HIGH (ref 0.0–17.9)
EBV VCA IgM: 142 U/mL — ABNORMAL HIGH (ref 0.0–35.9)

## 2023-11-09 ENCOUNTER — Telehealth: Payer: Self-pay

## 2023-11-09 ENCOUNTER — Ambulatory Visit: Payer: BC Managed Care – PPO

## 2023-11-09 NOTE — Progress Notes (Signed)
 Hi Hollie,  Please call patient and let her know the following.  Pt's labs confirm an active EBV infection.  This explains the acute rash/ reaction to the peri-operative rocephin prior to her cholecystectomy and the persistently elevate LFT's.  Pt should continue the steroid taper, make sure she takes a lot of fluids, avoid drinking alcohol and taking tylenol.  Please confirm her next appointment.  We will recheck labs in 2 weeks.  If still elevated we will refer to hepatologist.  Thanks!

## 2023-11-09 NOTE — Telephone Encounter (Signed)
 Patient came in today for her suture removal. She has questions about her EBV labs. I advised her that I would reach out to Dr Onalee Hua to find a good time to reschedule her follow up appointment to discuss her biopsy/lab results.

## 2023-11-09 NOTE — Telephone Encounter (Signed)
 I advised patient of her labs results. She has a follow up appointment 11/15/2023/hd

## 2023-11-09 NOTE — Progress Notes (Unsigned)
 Incision site is clean, dry and intact

## 2023-11-09 NOTE — Telephone Encounter (Signed)
-----   Message from Langston Reusing sent at 11/09/2023  3:01 PM EDT ----- [facility],  Please call patient and let her know the following.  Pt's labs confirm an active EBV infection.  This explains the acute rash/ reaction to the peri-operative rocephin prior to her cholecystectomy and the persistently elevate LFT's.  Pt should continue the steroid taper, make sure she takes a lot of fluids, avoid drinking alcohol and taking tylenol.  Please confirm her next appointment.  We will recheck labs in 2 weeks.  If still elevated we will refer to hepatologist.  Thanks!

## 2023-11-10 ENCOUNTER — Encounter: Payer: Self-pay | Admitting: Dermatology

## 2023-11-15 ENCOUNTER — Encounter: Payer: Self-pay | Admitting: Dermatology

## 2023-11-15 ENCOUNTER — Ambulatory Visit: Admitting: Dermatology

## 2023-11-15 VITALS — BP 113/73 | HR 78

## 2023-11-15 DIAGNOSIS — B2790 Infectious mononucleosis, unspecified without complication: Secondary | ICD-10-CM | POA: Diagnosis not present

## 2023-11-15 DIAGNOSIS — R21 Rash and other nonspecific skin eruption: Secondary | ICD-10-CM

## 2023-11-15 DIAGNOSIS — T361X5D Adverse effect of cephalosporins and other beta-lactam antibiotics, subsequent encounter: Secondary | ICD-10-CM

## 2023-11-15 DIAGNOSIS — D7212 Drug rash with eosinophilia and systemic symptoms syndrome: Secondary | ICD-10-CM

## 2023-11-15 DIAGNOSIS — R748 Abnormal levels of other serum enzymes: Secondary | ICD-10-CM

## 2023-11-15 DIAGNOSIS — L270 Generalized skin eruption due to drugs and medicaments taken internally: Secondary | ICD-10-CM

## 2023-11-15 MED ORDER — CLOBETASOL PROPIONATE 0.05 % EX CREA: 1.0000 | TOPICAL_CREAM | Freq: Two times a day (BID) | CUTANEOUS | 5 refills | Status: AC

## 2023-11-15 NOTE — Progress Notes (Signed)
 Follow-Up Visit   Subjective  Lori King is a 57 y.o. female who presents for the following: Bx Results  The patient, Lori King, presents with improving symptoms of a drug-induced hypersensitivity syndrome (DIHS), initially diagnosed as DRESS syndrome. She reports feeling "85%" better, though she notes the emergence of a localized rash. The patient's skin condition has significantly improved, with legs showing approximately 90% improvement. She continues to experience fatigue related to concurrent Epstein-Barr virus (EBV) infection, though less severe than four days prior. The patient reports ongoing exhaustion but notes progress in her overall condition.  The patient's drug reaction was triggered by Rocephin administration in the context of an active EBV infection, which was initially mistaken for strep throat or tonsillitis. She has a history of fatty liver disease diagnosed a year ago, unrelated to alcohol consumption. The patient discontinued caffeine intake due to its effects on her hypoglycemia, causing blood sugar fluctuations and shakiness.  Regarding cardiac concerns, a scan performed during hospitalization revealed a borderline enlarged heart. The patient expresses worry about potential cardiac complications related to DRESS syndrome.   The patient's liver enzymes remain elevated, though showing some improvement. Previous lab results indicate fluctuating levels of ALT and alkaline phosphatase, with recent trends suggesting a gradual decrease in these values.     The following portions of the chart were reviewed this encounter and updated as appropriate: medications, allergies, medical history  Review of Systems:  No other skin or systemic complaints except as noted in HPI or Assessment and Plan.  Objective  Well appearing patient in no apparent distress; mood and affect are within normal limits.  A full examination was performed including scalp, head, eyes, ears, nose,  lips, neck, chest, axillae, abdomen, back, buttocks, bilateral upper extremities, bilateral lower extremities, hands, feet, fingers, toes, fingernails, and toenails. All findings within normal limits unless otherwise noted below.   Relevant exam findings are noted in the Assessment and Plan.    Assessment & Plan   1. 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 Skin rash significantly improved, with legs showing 90% improvement. Liver enzymes remain elevated but trending downward. ALT decreased from 201 on 2/27 to 95 on 3/3. Alkaline phosphatase initially trended down but increased to 317 on 2/19, 331 subsequently, and is now at 259. Eosinophil count initially normal but later increased, possibly due to prednisone use. High ferritin levels indicate potential liver injury. - Plan:    Discontinue oral prednisone today    Use topical clobetasol for 2 weeks, then take a 2-week break    Continue CeraVe for hydration and itch relief    Repeat Complete Metabolic Panel (CMP) and Complete Blood Count (CBC) with differential on Friday    Avoid amoxicillin, ampicillin, and Rocephin while EBV is active    Follow up in 3 months  2. Active Epstein-Barr Virus (EBV) Infection - Assessment: Active EBV infection confirmed by elevated EBV IgM and IgG levels. Likely contributed to DRESS/DHIS reaction when Rocephin was administered. Patient reports ongoing fatigue, though improving. No specific antiviral treatment for EBV. - Plan:    Monitor EBV IgM levels; infection considered cleared when below 43    Encourage rest and hydration    Avoid alcohol consumption    Hepatologist to repeat EBV testing in 2-3 months  3. Elevated Liver Enzymes - Assessment: Liver enzymes remain  elevated,  potentially due to both drug reaction and active EBV infection. ALT and alkaline phosphatase levels still above normal but showing downward trend. - Plan:    Refer to hepatologist/gastroenterologist at [facility] for monitoring and management    Repeat liver function tests as part of CMP on Friday    Defer starting any new medications, including cholesterol medication, until liver enzymes normalize  4. Borderline Enlarged Heart - Assessment: Scan during hospitalization revealed borderline enlarged heart. Significance and etiology unclear. Could be incidental or related to perioperative fluid administration. - Plan:    Obtain new cardiology referral for further evaluation    Consider repeat cardiac imaging in 3-6 months if symptomatic   ELEVATED LIVER ENZYMES   Related Procedures CMP CBC with Differential/Platelets  Return in about 3 months (around 02/15/2024) for Rash F/U.  Documentation: I have reviewed the above documentation for accuracy and completeness, and I agree with the above.  Stasia Cavalier, am acting as scribe for Langston Reusing, DO.  Langston Reusing, DO

## 2023-11-15 NOTE — Patient Instructions (Addendum)
 [facility] Located in: [facility] Address: [address], [place], [place] [address] Hours: 8 am- 5?PM Monday- Friday Phone: [phone]  Important Information   Due to recent changes in healthcare laws, you may see results of your pathology and/or laboratory studies on MyChart before the doctors have had a chance to review them. We understand that in some cases there may be results that are confusing or concerning to you. Please understand that not all results are received at the same time and often the doctors may need to interpret multiple results in order to provide you with the best plan of care or course of treatment. Therefore, we ask that you please give [place] 2 business days to thoroughly review all your results before contacting the office for clarification. Should we see a critical lab result, you will be contacted sooner.     If You Need Anything After Your Visit   If you have any questions or concerns for your doctor, please call our main line at [phone]. If no one answers, please leave a voicemail as directed and we will return your call as soon as possible. Messages left after 4 pm will be answered the following business day.    You may also send [place] a message via MyChart. We typically respond to MyChart messages within 1-2 business days.  For prescription refills, please ask your pharmacy to contact our office. Our fax number is [phone].  If you have an urgent issue when the clinic is closed that cannot wait until the next business day, you can page your doctor at the number below.     Please note that while we do our best to be available for urgent issues outside of office hours, we are not available 24/7.    If you have an urgent issue and are unable to reach [place], you may choose to seek medical care at your doctor's office, retail clinic, urgent care center, or emergency room.   If you have a medical emergency, please immediately  call 911 or go to the emergency department. In the event of inclement weather, please call our main line at [phone] for an update on the status of any delays or closures.  Dermatology Medication Tips: Please keep the boxes that topical medications come in in order to help keep track of the instructions about where and how to use these. Pharmacies typically print the medication instructions only on the boxes and not directly on the medication tubes.   If your medication is too expensive, please contact our office at [phone] or send [place] a message through MyChart.    We are unable to tell what your co-pay for medications will be in advance as this is different depending on your insurance coverage. However, we may be able to find a substitute medication at lower cost or fill out paperwork to get insurance to cover a needed medication.    If a prior authorization is required to get your medication covered by your insurance company, please allow [place] 1-2 business days to complete this process.   Drug prices often vary depending on where the prescription is filled and some pharmacies may offer cheaper prices.   The website www.goodrx.com contains coupons for medications through different pharmacies. The prices here do not account for what the cost may be with help from insurance (it may be cheaper with your insurance), but the website can give you the price if you did not use any insurance.  -  You can print the associated coupon and take it with your prescription to the pharmacy.  - You may also stop by our office during regular business hours and pick up a GoodRx coupon card.  - If you need your prescription sent electronically to a different pharmacy, notify our office through [facility] or by phone at [phone]

## 2023-11-17 DIAGNOSIS — M7502 Adhesive capsulitis of left shoulder: Secondary | ICD-10-CM | POA: Diagnosis not present

## 2023-11-18 DIAGNOSIS — R748 Abnormal levels of other serum enzymes: Secondary | ICD-10-CM | POA: Diagnosis not present

## 2023-11-18 LAB — COMPREHENSIVE METABOLIC PANEL
ALT: 20 IU/L (ref 0–32)
AST: 15 IU/L (ref 0–40)
Albumin: 4.1 g/dL (ref 3.8–4.9)
Alkaline Phosphatase: 86 IU/L (ref 44–121)
BUN/Creatinine Ratio: 20 (ref 9–23)
BUN: 16 mg/dL (ref 6–24)
Bilirubin Total: 1 mg/dL (ref 0.0–1.2)
CO2: 23 mmol/L (ref 20–29)
Calcium: 9.1 mg/dL (ref 8.7–10.2)
Chloride: 103 mmol/L (ref 96–106)
Creatinine, Ser: 0.82 mg/dL (ref 0.57–1.00)
Globulin, Total: 2.5 g/dL (ref 1.5–4.5)
Glucose: 88 mg/dL (ref 70–99)
Potassium: 4.3 mmol/L (ref 3.5–5.2)
Sodium: 140 mmol/L (ref 134–144)
Total Protein: 6.6 g/dL (ref 6.0–8.5)
eGFR: 84 mL/min/{1.73_m2} (ref >59)

## 2023-11-18 LAB — CBC WITH DIFFERENTIAL/PLATELET
Basophils Absolute: 0.1 10*3/uL (ref 0.0–0.2)
Basos: 1 %
EOS (ABSOLUTE): 0.3 10*3/uL (ref 0.0–0.4)
Eos: 6 %
Hematocrit: 39 % (ref 34.0–46.6)
Hemoglobin: 12.5 g/dL (ref 11.1–15.9)
Immature Grans (Abs): 0 10*3/uL (ref 0.0–0.1)
Immature Granulocytes: 0 %
Lymphocytes Absolute: 1.9 10*3/uL (ref 0.7–3.1)
Lymphs: 35 %
MCH: 29.6 pg (ref 26.6–33.0)
MCHC: 32.1 g/dL (ref 31.5–35.7)
MCV: 92 fL (ref 79–97)
Monocytes Absolute: 0.4 10*3/uL (ref 0.1–0.9)
Monocytes: 7 %
Neutrophils Absolute: 2.7 10*3/uL (ref 1.4–7.0)
Neutrophils: 51 %
Platelets: 258 10*3/uL (ref 150–450)
RBC: 4.22 x10E6/uL (ref 3.77–5.28)
RDW: 13.2 % (ref 11.7–15.4)
WBC: 5.3 10*3/uL (ref 3.4–10.8)

## 2023-11-21 ENCOUNTER — Encounter: Payer: Self-pay | Admitting: Dermatology

## 2023-11-21 NOTE — Progress Notes (Signed)
 Hi Lori King,  [employer], your most recent labs show all your liver function numbers are back to normal.  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    For now your rash is pretty much clear and all your labs were normal.  Please don't hesitate to reach out if you have any additional questions or issues.  Best,  Dr. Onalee Hua

## 2023-11-22 NOTE — Telephone Encounter (Signed)
 Please let her know that gabapentin is ok to take and not contributing to the Dress.    She can restart the clobetasol for any active skin lesions.  Thanks!

## 2023-11-30 DIAGNOSIS — E2740 Unspecified adrenocortical insufficiency: Secondary | ICD-10-CM | POA: Diagnosis not present

## 2023-11-30 DIAGNOSIS — F329 Major depressive disorder, single episode, unspecified: Secondary | ICD-10-CM | POA: Diagnosis not present

## 2023-11-30 DIAGNOSIS — E161 Other hypoglycemia: Secondary | ICD-10-CM | POA: Diagnosis not present

## 2023-11-30 DIAGNOSIS — F4310 Post-traumatic stress disorder, unspecified: Secondary | ICD-10-CM | POA: Diagnosis not present

## 2023-12-07 DIAGNOSIS — R748 Abnormal levels of other serum enzymes: Secondary | ICD-10-CM | POA: Diagnosis not present

## 2023-12-07 DIAGNOSIS — B2790 Infectious mononucleosis, unspecified without complication: Secondary | ICD-10-CM | POA: Diagnosis not present

## 2023-12-08 DIAGNOSIS — N951 Menopausal and female climacteric states: Secondary | ICD-10-CM | POA: Diagnosis not present

## 2023-12-08 DIAGNOSIS — Z1329 Encounter for screening for other suspected endocrine disorder: Secondary | ICD-10-CM | POA: Diagnosis not present

## 2023-12-08 DIAGNOSIS — R5383 Other fatigue: Secondary | ICD-10-CM | POA: Diagnosis not present

## 2023-12-08 DIAGNOSIS — E161 Other hypoglycemia: Secondary | ICD-10-CM | POA: Diagnosis not present

## 2023-12-08 DIAGNOSIS — F419 Anxiety disorder, unspecified: Secondary | ICD-10-CM | POA: Diagnosis not present

## 2023-12-08 DIAGNOSIS — E612 Magnesium deficiency: Secondary | ICD-10-CM | POA: Diagnosis not present

## 2023-12-08 DIAGNOSIS — E559 Vitamin D deficiency, unspecified: Secondary | ICD-10-CM | POA: Diagnosis not present

## 2023-12-08 DIAGNOSIS — R251 Tremor, unspecified: Secondary | ICD-10-CM | POA: Diagnosis not present

## 2023-12-12 DIAGNOSIS — L650 Telogen effluvium: Secondary | ICD-10-CM | POA: Diagnosis not present

## 2023-12-12 DIAGNOSIS — L821 Other seborrheic keratosis: Secondary | ICD-10-CM | POA: Diagnosis not present

## 2023-12-12 DIAGNOSIS — D1801 Hemangioma of skin and subcutaneous tissue: Secondary | ICD-10-CM | POA: Diagnosis not present

## 2023-12-12 DIAGNOSIS — L658 Other specified nonscarring hair loss: Secondary | ICD-10-CM | POA: Diagnosis not present

## 2023-12-22 DIAGNOSIS — F419 Anxiety disorder, unspecified: Secondary | ICD-10-CM | POA: Diagnosis not present

## 2023-12-22 DIAGNOSIS — R251 Tremor, unspecified: Secondary | ICD-10-CM | POA: Diagnosis not present

## 2023-12-22 DIAGNOSIS — R5383 Other fatigue: Secondary | ICD-10-CM | POA: Diagnosis not present

## 2023-12-22 DIAGNOSIS — E161 Other hypoglycemia: Secondary | ICD-10-CM | POA: Diagnosis not present

## 2024-01-09 DIAGNOSIS — B2790 Infectious mononucleosis, unspecified without complication: Secondary | ICD-10-CM | POA: Diagnosis not present

## 2024-01-09 DIAGNOSIS — R748 Abnormal levels of other serum enzymes: Secondary | ICD-10-CM | POA: Diagnosis not present

## 2024-01-18 ENCOUNTER — Ambulatory Visit (INDEPENDENT_AMBULATORY_CARE_PROVIDER_SITE_OTHER): Admitting: Family Medicine

## 2024-01-18 ENCOUNTER — Encounter: Payer: Self-pay | Admitting: Family Medicine

## 2024-01-18 VITALS — BP 122/68 | HR 68 | Temp 98.1°F | Ht 62.0 in | Wt 138.1 lb

## 2024-01-18 DIAGNOSIS — R55 Syncope and collapse: Secondary | ICD-10-CM | POA: Diagnosis not present

## 2024-01-18 DIAGNOSIS — E7800 Pure hypercholesterolemia, unspecified: Secondary | ICD-10-CM | POA: Diagnosis not present

## 2024-01-18 DIAGNOSIS — R002 Palpitations: Secondary | ICD-10-CM | POA: Diagnosis not present

## 2024-01-18 DIAGNOSIS — I2510 Atherosclerotic heart disease of native coronary artery without angina pectoris: Secondary | ICD-10-CM

## 2024-01-18 DIAGNOSIS — E161 Other hypoglycemia: Secondary | ICD-10-CM

## 2024-01-18 DIAGNOSIS — R197 Diarrhea, unspecified: Secondary | ICD-10-CM

## 2024-01-18 NOTE — Progress Notes (Addendum)
 Patient Office Visit  Assessment & Plan:  Syncope, unspecified syncope type -     CBC with Differential/Platelet -     Comprehensive metabolic panel with GFR -     TSH -     EKG 12-Lead  Diarrhea, unspecified type  Elevated cholesterol -     Lipid panel  Palpitations -     TSH -     EKG 12-Lead  Coronary artery calcification seen on CT scan -     Lipid panel  Reactive hypoglycemia -     Comprehensive metabolic panel with GFR   Assessment and Plan    Syncope Likely vasovagal syncope with no head injury. Dehydration and vagal response considered. - Advise maintaining hydration. - Wear compression stockings for a week.  Nausea and vomiting Acute episode resolved post-vomiting. Non-infectious etiology likely, possible food-related cause.  Diarrhea Acute episode resolved with dietary modifications and loperamide. - Continue bland diet as tolerated.  Palpitations Intermittent palpitations during exertion. Awaiting cardiology follow-up. - Perform EKG to assess current cardiac status. - Follow up with cardiologist.  Food allergy Concern for cross-contamination with fish during travel. No direct exposure to known allergens.  Hypoglycemia Managed with dietary modifications. No recent episodes.  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 Concern for flare-up. - Evaluate rash on legs for potential Dress syndrome flare-up. - Check liver function tests as part of blood work.       Return if symptoms worsen or fail to improve.   Subjective:     Patient ID: Lori King, female    DOB: 1965/09/25  Age: 58 y.o. MRN: [id number]  Chief Complaint  Patient presents with   Loss of Consciousness    Associated with nausea, vomiting and diarrhea.     HPI Discussed the use of AI scribe software for clinical note transcription with the patient, who gave verbal consent to proceed.  History of Present Illness        Lori King  "Lori King" is a 58 year old female who presents with recent syncope and gastrointestinal symptoms during a trip to [place]. Patient returned last night from [place]  During her stay in [place], she experienced a syncopal episode in the hotel lobby after feeling nauseous and hot during breakfast. She was caught by her partner and did not sustain any injuries. She was unconscious for a few seconds and experienced twitching on her cheek. Following the syncope, she had one episode of projectile vomiting and subsequently developed watery diarrhea, which lasted for about a day and a half. No fever or chills were noted. She managed the diarrhea with OTC immodium  She has a history of food allergies, particularly to fish, and suspects possible cross-contamination at the breakfast buffet. She also mentioned consuming beef that was rarer than usual the night before the incident. A [place] doctor was called to see her at the hotel after her episode and advised her to stay well-hydrated and eat fruit, though she has been cautious with fruit intake due to her hypoglycemia. Doctor there told her it was likely a vagal episode.   She has been wearing compression stockings and has been following a bland diet to manage her symptoms. Her blood sugar levels remained stable during her trip, even while consuming pastries. She experiences palpitations, particularly when physically active, such as running around [place].  She has a family history of heart disease, with her father having had a heart attack and elevated cholesterol, and  mother  with elevated chol and Afib. She is currently trying to schedule an appointment with a cardiologist and has previously worn a heart monitor two years ago to investigate heart issues. Physical Exam SKIN: Rash with small dots on legs. Results LABS Total Cholesterol: 228 last year  RADIOLOGY CT Scan: Bilateral lobe atelectasis, calcification, borderline cardiomegaly (February  2025) Assessment & Plan Syncope Likely vasovagal syncope with no head injury. Dehydration and vagal response considered. - Advise maintaining hydration. - Wear compression stockings for a week. Patient will be seeing cardiologist Dr. Kenny Peals in [place]. May need to do Zio Patch again  Nausea and vomiting Acute episode resolved post-vomiting. Non-infectious etiology likely, possible food-related cause.  Diarrhea Acute episode resolved with dietary modifications and loperamide. - Continue bland diet as tolerated.  Palpitations Intermittent palpitations during exertion. Awaiting cardiology follow-up. - Perform EKG to assess current cardiac status. - Follow up with cardiologist. EKG- NSR, no acute changes, patient is aware  Food allergy Concern for cross-contamination with fish during travel. No direct exposure to known allergens.  Hypoglycemia reactive  Managed with dietary modifications. No recent episodes.  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 Concern for flare-up. - Evaluate rash on legs for potential Dress syndrome flare-up. - Check liver function tests as part of blood work. Hyperlipidemia-elevated chol last year. Pt has not started statin therapy    The 10-year ASCVD risk score (Arnett DK, et al., 2019) is: 1.8% The 10-year ASCVD risk score (Arnett DK, et al., 2019) is: 1.7%   Values used to calculate the score:     Age: 4 years     Sex: Female     Is Non-Hispanic African American: No     Diabetic: No     Tobacco smoker: No     Systolic Blood Pressure: 122 mmHg     Is BP treated: No     HDL Cholesterol: 43 mg/dL     Total Cholesterol: 134 mg/dL  Past Medical History:  Diagnosis Date   Adhesive capsulitis of right shoulder    Allergic rhinitis, seasonal    Allergy    Anemia    Anxiety    MDD (major depressive disorder)    Wears contact lenses    Past Surgical History:  Procedure Laterality Date   BREAST  BIOPSY Left 2010   x2 per pt benign   CESAREAN SECTION  X2  last one 02-28-2001   W/  BILATERAL TUBAL LIGATION w/ last c/s   CHOLECYSTECTOMY N/A 10/15/2023   Procedure: LAPAROSCOPIC CHOLECYSTECTOMY WITH INTRAOPERATIVE CHOLANGIOGRAM AND ICG DYE;  Surgeon: Aldean Hummingbird, MD;  Location: [facility];  Service: General;  Laterality: N/A;   COLONOSCOPY  11/01/2017   HYSTEROSCOPY W/ ENDOMETRIAL ABLATION  2019   LUMBAR LAMINECTOMY/DECOMPRESSION MICRODISCECTOMY  2013   L4  --- S1   SHOULDER ARTHROSCOPY Right 08/28/2020   Procedure: ARTHROSCOPY SHOULDER lysis of adhesions and manipulation under anesthesia, extensive debridement;  Surgeon: Janeth Medicus, MD;  Location: [facility];  Service: Orthopedics;  Laterality: Right;   SPINE SURGERY     TUBAL LIGATION     Social History   Tobacco Use   Smoking status: Never    Passive exposure: Never   Smokeless tobacco: Never  Vaping Use   Vaping status: Never Used  Substance Use Topics   Alcohol use: Not Currently   Drug use: Never   Family History  Problem Relation Age of Onset   Breast cancer Mother 49  Atrial fibrillation Mother    Hypertension Mother    Hyperlipidemia Mother    Arthritis Mother    Stroke Father    Heart disease Father    Breast cancer Sister 29   Anxiety disorder Sister    Hypertension Sister    Bipolar disorder Daughter    Rheum arthritis Neg Hx    Lupus Neg Hx    Allergies  Allergen Reactions   Other Anaphylaxis    ALL TYPES OF FISH   Rocephin  [Ceftriaxone ] Rash   Shellfish Allergy Anaphylaxis    ALL SHELLFISH   Escitalopram Other (See Comments)   Cephalosporins Rash   Ciprofloxacin Rash   Doxycycline Itching   Keflex [Cephalexin] Rash   Merrem  [Meropenem ]     Possible rash vs related to prior abx (Rocephin )   Penicillins Rash    Did it involve swelling of the face/tongue/throat, SOB, or low BP? No Did it involve sudden or severe rash/hives, skin peeling, or any reaction on the inside  of your mouth or nose? Yes Did you need to seek medical attention at a hospital or doctor's office? No When did it last happen?  Over 30 years ago.     If all above answers are "NO", may proceed with cephalosporin use.      ROS    Objective:    BP 122/68   Pulse 68   Temp 98.1 F (36.7 C)   Ht 5\' 2"  (1.575 m)   Wt 138 lb 2 oz (62.7 kg)   SpO2 99%   BMI 25.26 kg/m  BP Readings from Last 3 Encounters:  01/18/24 122/68  11/15/23 113/73  10/31/23 104/60   Wt Readings from Last 3 Encounters:  01/18/24 138 lb 2 oz (62.7 kg)  10/31/23 133 lb (60.3 kg)  10/18/23 136 lb 4.8 oz (61.8 kg)    Physical Exam Vitals and nursing note reviewed.  Constitutional:      General: She is not in acute distress.    Appearance: Normal appearance.     Comments: Patient comes in with her sister  HENT:     Head: Normocephalic.     Right Ear: Tympanic membrane, ear canal and external ear normal.     Left Ear: Tympanic membrane, ear canal and external ear normal.  Eyes:     Extraocular Movements: Extraocular movements intact.     Pupils: Pupils are equal, round, and reactive to light.  Cardiovascular:     Rate and Rhythm: Normal rate and regular rhythm.     Heart sounds: Normal heart sounds.  Pulmonary:     Effort: Pulmonary effort is normal.     Breath sounds: Normal breath sounds. No wheezing.  Abdominal:     Tenderness: There is no abdominal tenderness.  Musculoskeletal:     Right lower leg: No edema.     Left lower leg: No edema.  Skin:    Findings: Rash present.     Comments: Patient few scattered faintly erythematous lesions over thighs   Neurological:     General: No focal deficit present.     Mental Status: She is alert and oriented to person, place, and time. Mental status is at baseline.     Gait: Gait normal.  Psychiatric:        Mood and Affect: Mood normal.        Behavior: Behavior normal.        Thought Content: Thought content normal.        Judgment: Judgment  normal.  Results for orders placed or performed in visit on 01/18/24  CBC with Differential/Platelet  Result Value Ref Range   WBC 4.4 3.8 - 10.8 Thousand/uL   RBC 4.08 3.80 - 5.10 Million/uL   Hemoglobin 11.8 11.7 - 15.5 g/dL   HCT 57.8 46.9 - 62.9 %   MCV 89.2 80.0 - 100.0 fL   MCH 28.9 27.0 - 33.0 pg   MCHC 32.4 32.0 - 36.0 g/dL   RDW 52.8 41.3 - 24.4 %   Platelets 278 140 - 400 Thousand/uL   MPV 10.7 7.5 - 12.5 fL   Neutro Abs 1,830 1,500 - 7,800 cells/uL   Absolute Lymphocytes 2,020 850 - 3,900 cells/uL   Absolute Monocytes 339 200 - 950 cells/uL   Eosinophils Absolute 180 15 - 500 cells/uL   Basophils Absolute 31 0 - 200 cells/uL   Neutrophils Relative % 41.6 %   Total Lymphocyte 45.9 %   Monocytes Relative 7.7 %   Eosinophils Relative 4.1 %   Basophils Relative 0.7 %

## 2024-01-19 LAB — CBC WITH DIFFERENTIAL/PLATELET
Absolute Lymphocytes: 2020 {cells}/uL (ref 850–3900)
Absolute Monocytes: 339 {cells}/uL (ref 200–950)
Basophils Absolute: 31 {cells}/uL (ref 0–200)
Basophils Relative: 0.7 %
Eosinophils Absolute: 180 {cells}/uL (ref 15–500)
Eosinophils Relative: 4.1 %
HCT: 36.4 % (ref 35.0–45.0)
Hemoglobin: 11.8 g/dL (ref 11.7–15.5)
MCH: 28.9 pg (ref 27.0–33.0)
MCHC: 32.4 g/dL (ref 32.0–36.0)
MCV: 89.2 fL (ref 80.0–100.0)
MPV: 10.7 fL (ref 7.5–12.5)
Monocytes Relative: 7.7 %
Neutro Abs: 1830 {cells}/uL (ref 1500–7800)
Neutrophils Relative %: 41.6 %
Platelets: 278 10*3/uL (ref 140–400)
RBC: 4.08 10*6/uL (ref 3.80–5.10)
RDW: 13.1 % (ref 11.0–15.0)
Total Lymphocyte: 45.9 %
WBC: 4.4 10*3/uL (ref 3.8–10.8)

## 2024-01-19 LAB — COMPREHENSIVE METABOLIC PANEL WITH GFR
AG Ratio: 1.8 (calc) (ref 1.0–2.5)
ALT: 27 U/L (ref 6–29)
AST: 23 U/L (ref 10–35)
Albumin: 4.3 g/dL (ref 3.6–5.1)
Alkaline phosphatase (APISO): 59 U/L (ref 37–153)
BUN: 11 mg/dL (ref 7–25)
CO2: 28 mmol/L (ref 20–32)
Calcium: 9.4 mg/dL (ref 8.6–10.4)
Chloride: 101 mmol/L (ref 98–110)
Creat: 0.66 mg/dL (ref 0.50–1.03)
Globulin: 2.4 g/dL (ref 1.9–3.7)
Glucose, Bld: 87 mg/dL (ref 65–99)
Potassium: 4.1 mmol/L (ref 3.5–5.3)
Sodium: 138 mmol/L (ref 135–146)
Total Bilirubin: 0.9 mg/dL (ref 0.2–1.2)
Total Protein: 6.7 g/dL (ref 6.1–8.1)
eGFR: 103 mL/min/{1.73_m2} (ref >=60)

## 2024-01-19 LAB — LIPID PANEL
Cholesterol: 134 mg/dL (ref <200)
HDL: 43 mg/dL — ABNORMAL LOW (ref >=50)
LDL Cholesterol (Calc): 68 mg/dL
Non-HDL Cholesterol (Calc): 91 mg/dL (ref <130)
Total CHOL/HDL Ratio: 3.1 (calc) (ref <5.0)
Triglycerides: 151 mg/dL — ABNORMAL HIGH (ref <150)

## 2024-01-19 LAB — TSH: TSH: 1.24 m[IU]/L (ref 0.40–4.50)

## 2024-01-20 ENCOUNTER — Encounter: Payer: Self-pay | Admitting: Family Medicine

## 2024-01-20 ENCOUNTER — Ambulatory Visit: Payer: Self-pay | Admitting: Family Medicine

## 2024-02-07 ENCOUNTER — Encounter: Payer: Self-pay | Admitting: Dermatology

## 2024-02-07 NOTE — Telephone Encounter (Signed)
 After having such a severe rash it's not uncommon for the skin to be more sensitive to the sun.  I recommend she use a mineral sunscreen daily.  The mottled appearance of the skin can also be a post inflammatory skin change that can take a long while to improve.  -Dr D

## 2024-02-15 ENCOUNTER — Other Ambulatory Visit (HOSPITAL_COMMUNITY): Payer: Self-pay | Admitting: Cardiology

## 2024-02-15 DIAGNOSIS — R002 Palpitations: Secondary | ICD-10-CM | POA: Diagnosis not present

## 2024-02-15 DIAGNOSIS — R7303 Prediabetes: Secondary | ICD-10-CM | POA: Diagnosis not present

## 2024-02-15 DIAGNOSIS — I509 Heart failure, unspecified: Secondary | ICD-10-CM | POA: Diagnosis not present

## 2024-02-15 DIAGNOSIS — R0789 Other chest pain: Secondary | ICD-10-CM | POA: Diagnosis not present

## 2024-02-16 DIAGNOSIS — E2740 Unspecified adrenocortical insufficiency: Secondary | ICD-10-CM | POA: Diagnosis not present

## 2024-02-16 DIAGNOSIS — E161 Other hypoglycemia: Secondary | ICD-10-CM | POA: Diagnosis not present

## 2024-02-16 DIAGNOSIS — R002 Palpitations: Secondary | ICD-10-CM | POA: Diagnosis not present

## 2024-03-08 ENCOUNTER — Encounter: Payer: Self-pay | Admitting: Family Medicine

## 2024-03-12 ENCOUNTER — Encounter: Payer: Self-pay | Admitting: Family Medicine

## 2024-03-12 ENCOUNTER — Ambulatory Visit: Admitting: Family Medicine

## 2024-03-12 VITALS — BP 118/68 | HR 73 | Temp 98.2°F | Ht 62.0 in | Wt 138.1 lb

## 2024-03-12 DIAGNOSIS — E785 Hyperlipidemia, unspecified: Secondary | ICD-10-CM

## 2024-03-12 DIAGNOSIS — M79604 Pain in right leg: Secondary | ICD-10-CM | POA: Diagnosis not present

## 2024-03-12 DIAGNOSIS — M79605 Pain in left leg: Secondary | ICD-10-CM | POA: Diagnosis not present

## 2024-03-12 DIAGNOSIS — R002 Palpitations: Secondary | ICD-10-CM

## 2024-03-12 DIAGNOSIS — M79642 Pain in left hand: Secondary | ICD-10-CM

## 2024-03-12 DIAGNOSIS — M25649 Stiffness of unspecified hand, not elsewhere classified: Secondary | ICD-10-CM

## 2024-03-12 DIAGNOSIS — M79641 Pain in right hand: Secondary | ICD-10-CM | POA: Diagnosis not present

## 2024-03-12 NOTE — Progress Notes (Signed)
 Patient Office Visit  Assessment & Plan:  Pain in both lower extremities -     CBC with Differential/Platelet -     C-reactive protein -     Cyclic citrul peptide antibody, IgG -     ANA -     Rheumatoid factor -     Sedimentation rate  Pain in both hands -     CBC with Differential/Platelet -     C-reactive protein -     Cyclic citrul peptide antibody, IgG -     ANA -     Rheumatoid factor -     Sedimentation rate  Dyslipidemia, goal LDL below 70 -     Lipid panel  Palpitations -     TSH -     Comprehensive metabolic panel with GFR  Stiffness of hand joint, unspecified laterality -     C-reactive protein -     Cyclic citrul peptide antibody, IgG -     ANA -     Rheumatoid factor -     Sedimentation rate   Assessment and Plan    Leg and hand pain Pain in legs and hands with cramping and stiffness, differential includes rheumatoid arthritis, plantar fasciitis, or inflammatory condition. - Order ANA, rheumatoid factor, C-reactive protein, sed rate. - Order comprehensive metabolic panel. - Order CBC.  Reactive hypoglycemia Managed with Acarbose to prevent blood sugar fluctuations. Endocrinologist testing for secondary adrenal insufficiency. - Continue Acarbose as prescribed. - Perform blood glucose finger stick during visit. - Coordinate with endocrinologist for testing of secondary adrenal insufficiency.  Borderline enlarged heart Borderline enlarged heart on CT, further evaluation by cardiologist with heart monitor and planned echocardiogram. - Continue wearing heart monitor for one month. - Schedule echocardiogram on March 20, 2024.  Coronary artery calcification Calcific coronary artery disease with borderline cholesterol levels. Ten-year risk score low. Medication considered but not started due to tolerance issues. - Monitor cholesterol levels. - Discuss potential need for cholesterol medication after further evaluation.  Constipation Constipation  improved with Acarbose post-gallbladder removal. - Continue Acarbose as it helps with bowel regularity.     Will complete biometric form after labs return.  Recommend healthy diet i.e mediterranean/DASH diet, consistent exercise - 30 minutes 5 day per week, and gradual weight loss.     Return if symptoms worsen or fail to improve.   Subjective:    Patient ID: Lori King, female    DOB: December 27, 1965  Age: 58 y.o. MRN: [id number]  Chief Complaint  Patient presents with   Leg Pain    Pt c/o of leg pain when she wakes up.    Hand Pain    Pt c/o of hand pain when she wakes up.    Leg Pain   Hand Pain    Discussed the use of AI scribe software for clinical note transcription with the patient, who gave verbal consent to proceed.  History of Present Illness        Lori King is a 58 year old female who presents with bilateral foot and leg pain and cramping and bilateral hand pain and stiffness.  She experiences pain in both legs from ankles to the knees, which wakes her up at night, along with cramping in her arches and pain in her hands. These symptoms have been present for a while but have worsened over the past month. There have been no new activities or changes in medication. Stiffness and pain in her hands and feet  improve with movement and stretching. She has difficulty with grip strength, such as opening water bottles. She is not aware of any family history of rheumatoid arthritis or autoimmune disorder. No fever or chills. She reports a teeny little bit of swelling in her feet, which goes down at night.  She is currently using a one month heart monitor due to a past episode of syncope in [place] and experiencing palpitations. A previous CT scan indicated a borderline enlarged heart and prominent pulmonary vascular markings. She is scheduled for an echocardiogram later this month. She has been experiencing palpitations and occasional skipped beats, which she hopes  will be captured by the heart monitor she is wearing for a month.  She has a history of reactive hypoglycemia and is on Acarbose to manage blood sugar spikes. Her endocrinologist advised that Acarbose helps prevent spikes and subsequent drops in blood sugar levels. She feels 'weird' and requests a finger stick to check her blood sugar levels.  She mentions a change in bowel habits since her cholecystectomy in February, experiencing constipation rather than the expected diarrhea. She typically has bowel movements every other day, which is less frequent than her norm with Acarbose. Physical Exam MUSCULOSKELETAL: Hand strength normal. Results LABS   Epstein-Barr virus: No longer reactive    RADIOLOGY   CT scan: Borderline cardiomegaly, prominent pulmonary vascular markings (10/20/2023) Assessment & Plan Bilateral Leg and hand pain Pain in legs and hands with cramping and stiffness, differential includes rheumatoid arthritis, plantar fasciitis, or inflammatory condition. - Order ANA, rheumatoid factor, C-reactive protein, sed rate. - Order comprehensive metabolic panel. - Order CBC.  Reactive hypoglycemia Managed with Acarbose to prevent blood sugar fluctuations. Endocrinologist testing for secondary adrenal insufficiency. - Continue Acarbose as prescribed. - Perform blood glucose finger stick during visit. - Coordinate with endocrinologist for testing of secondary adrenal insufficiency.  Borderline enlarged heart noted on CT scan in Feb Borderline enlarged heart on CT, further evaluation by cardiologist with heart monitor and planned echocardiogram later this month - Continue wearing heart monitor for one month. - Schedule echocardiogram on March 20, 2024.  Coronary artery calcification Calcific coronary artery disease with borderline cholesterol levels. Ten-year risk score low. Medication considered but not started due to tolerance issues. - Monitor cholesterol levels. - Discuss  potential need for cholesterol medication after further evaluation.  Constipation Constipation improved with Acarbose post-gallbladder removal. - Continue Acarbose as it helps with bowel regularity. The 10-year ASCVD risk score (Arnett DK, et al., 2019) is: 1.8%   Values used to calculate the score:     Age: 54 years     Clincally relevant sex: Female     Is Non-Hispanic African American: No     Diabetic: No     Tobacco smoker: No     Systolic Blood Pressure: 118 mmHg     Is BP treated: No     HDL Cholesterol: 63 mg/dL     Total Cholesterol: 220 mg/dL    The 89-bzjm ASCVD risk score (Arnett DK, et al., 2019) is: 1.6%  Past Medical History:  Diagnosis Date   Adhesive capsulitis of right shoulder    Allergic rhinitis, seasonal    Allergy    Anemia    Anxiety    MDD (major depressive disorder)    Wears contact lenses    Past Surgical History:  Procedure Laterality Date   BREAST BIOPSY Left 2010   x2 per pt benign   CESAREAN SECTION  X2  last one 02-28-2001  W/  BILATERAL TUBAL LIGATION w/ last c/s   CHOLECYSTECTOMY N/A 10/15/2023   Procedure: LAPAROSCOPIC CHOLECYSTECTOMY WITH INTRAOPERATIVE CHOLANGIOGRAM AND ICG DYE;  Surgeon: Tanda Locus, MD;  Location: [facility];  Service: General;  Laterality: N/A;   COLONOSCOPY  11/01/2017   HYSTEROSCOPY W/ ENDOMETRIAL ABLATION  2019   LUMBAR LAMINECTOMY/DECOMPRESSION MICRODISCECTOMY  2013   L4  --- S1   SHOULDER ARTHROSCOPY Right 08/28/2020   Procedure: ARTHROSCOPY SHOULDER lysis of adhesions and manipulation under anesthesia, extensive debridement;  Surgeon: Sharl Selinda Dover, MD;  Location: [facility];  Service: Orthopedics;  Laterality: Right;   SPINE SURGERY     TUBAL LIGATION     Social History   Tobacco Use   Smoking status: Never    Passive exposure: Never   Smokeless tobacco: Never  Vaping Use   Vaping status: Never Used  Substance Use Topics   Alcohol use: Not Currently   Drug use: Never    Family History  Problem Relation Age of Onset   Breast cancer Mother 51   Atrial fibrillation Mother    Hypertension Mother    Hyperlipidemia Mother    Arthritis Mother    Stroke Father    Heart disease Father    Breast cancer Sister 64   Anxiety disorder Sister    Hypertension Sister    Bipolar disorder Daughter    Rheum arthritis Neg Hx    Lupus Neg Hx    Allergies  Allergen Reactions   Other Anaphylaxis    ALL TYPES OF FISH   Rocephin  [Ceftriaxone ] Rash   Shellfish Allergy Anaphylaxis    ALL SHELLFISH   Escitalopram Other (See Comments)   Cephalosporins Rash   Ciprofloxacin Rash   Doxycycline Itching   Keflex [Cephalexin] Rash   Merrem  [Meropenem ]     Possible rash vs related to prior abx (Rocephin )   Penicillins Rash    Did it involve swelling of the face/tongue/throat, SOB, or low BP? No Did it involve sudden or severe rash/hives, skin peeling, or any reaction on the inside of your mouth or nose? Yes Did you need to seek medical attention at a hospital or doctor's office? No When did it last happen?  Over 30 years ago.     If all above answers are NO, may proceed with cephalosporin use.      ROS    Objective:    BP 118/68   Pulse 73   Temp 98.2 F (36.8 C)   Ht 5' 2 (1.575 m)   Wt 138 lb 2 oz (62.7 kg)   SpO2 98%   BMI 25.26 kg/m  BP Readings from Last 3 Encounters:  03/12/24 118/68  01/18/24 122/68  11/15/23 113/73   Wt Readings from Last 3 Encounters:  03/12/24 138 lb 2 oz (62.7 kg)  01/18/24 138 lb 2 oz (62.7 kg)  10/31/23 133 lb (60.3 kg)    Physical Exam Vitals and nursing note reviewed.  Constitutional:      General: She is not in acute distress.    Appearance: Normal appearance.  HENT:     Head: Normocephalic.     Right Ear: Tympanic membrane, ear canal and external ear normal.     Left Ear: Tympanic membrane, ear canal and external ear normal.  Eyes:     Extraocular Movements: Extraocular movements intact.     Pupils:  Pupils are equal, round, and reactive to light.  Cardiovascular:     Rate and Rhythm: Normal rate and regular rhythm.  Heart sounds: Normal heart sounds. No murmur heard. Pulmonary:     Effort: Pulmonary effort is normal.     Breath sounds: Normal breath sounds.  Musculoskeletal:     Right hand: No swelling, tenderness or bony tenderness. Normal range of motion. Normal strength.     Left hand: No swelling, tenderness or bony tenderness. Normal range of motion. Normal strength.     Right lower leg: Normal. No swelling. No edema.     Left lower leg: Normal. No swelling. No edema.     Right foot: Normal.     Left foot: Normal.  Skin:    Comments: Few scattered erythematous area over right groin area.  Neurological:     General: No focal deficit present.     Mental Status: She is alert and oriented to person, place, and time.     Motor: No weakness.     Gait: Gait normal.  Psychiatric:        Mood and Affect: Mood normal.        Behavior: Behavior normal.        Judgment: Judgment normal.      No results found for any visits on 03/12/24.

## 2024-03-13 ENCOUNTER — Ambulatory Visit: Payer: Self-pay | Admitting: Family Medicine

## 2024-03-14 ENCOUNTER — Encounter: Payer: Self-pay | Admitting: Family Medicine

## 2024-03-14 LAB — COMPREHENSIVE METABOLIC PANEL WITH GFR
AG Ratio: 1.6 (calc) (ref 1.0–2.5)
ALT: 14 U/L (ref 6–29)
AST: 13 U/L (ref 10–35)
Albumin: 4.4 g/dL (ref 3.6–5.1)
Alkaline phosphatase (APISO): 61 U/L (ref 37–153)
BUN: 16 mg/dL (ref 7–25)
CO2: 28 mmol/L (ref 20–32)
Calcium: 9.5 mg/dL (ref 8.6–10.4)
Chloride: 104 mmol/L (ref 98–110)
Creat: 0.64 mg/dL (ref 0.50–1.03)
Globulin: 2.8 g/dL (ref 1.9–3.7)
Glucose, Bld: 89 mg/dL (ref 65–99)
Potassium: 4.4 mmol/L (ref 3.5–5.3)
Sodium: 140 mmol/L (ref 135–146)
Total Bilirubin: 0.8 mg/dL (ref 0.2–1.2)
Total Protein: 7.2 g/dL (ref 6.1–8.1)
eGFR: 104 mL/min/1.73m2 (ref >=60)

## 2024-03-14 LAB — CBC WITH DIFFERENTIAL/PLATELET
Absolute Lymphocytes: 1973 {cells}/uL (ref 850–3900)
Absolute Monocytes: 336 {cells}/uL (ref 200–950)
Basophils Absolute: 41 {cells}/uL (ref 0–200)
Basophils Relative: 0.9 %
Eosinophils Absolute: 179 {cells}/uL (ref 15–500)
Eosinophils Relative: 3.9 %
HCT: 38.8 % (ref 35.0–45.0)
Hemoglobin: 12.5 g/dL (ref 11.7–15.5)
MCH: 28.6 pg (ref 27.0–33.0)
MCHC: 32.2 g/dL (ref 32.0–36.0)
MCV: 88.8 fL (ref 80.0–100.0)
MPV: 10.6 fL (ref 7.5–12.5)
Monocytes Relative: 7.3 %
Neutro Abs: 2070 {cells}/uL (ref 1500–7800)
Neutrophils Relative %: 45 %
Platelets: 281 Thousand/uL (ref 140–400)
RBC: 4.37 Million/uL (ref 3.80–5.10)
RDW: 12.9 % (ref 11.0–15.0)
Total Lymphocyte: 42.9 %
WBC: 4.6 Thousand/uL (ref 3.8–10.8)

## 2024-03-14 LAB — LIPID PANEL
Cholesterol: 220 mg/dL — ABNORMAL HIGH (ref <200)
HDL: 63 mg/dL (ref >=50)
LDL Cholesterol (Calc): 137 mg/dL — ABNORMAL HIGH
Non-HDL Cholesterol (Calc): 157 mg/dL — ABNORMAL HIGH (ref <130)
Total CHOL/HDL Ratio: 3.5 (calc) (ref <5.0)
Triglycerides: 101 mg/dL (ref <150)

## 2024-03-14 LAB — CYCLIC CITRUL PEPTIDE ANTIBODY, IGG: Cyclic Citrullin Peptide Ab: <16 U

## 2024-03-14 LAB — C-REACTIVE PROTEIN: CRP: <3 mg/L (ref <8.0)

## 2024-03-14 LAB — SEDIMENTATION RATE: Sed Rate: 25 mm/h (ref 0–30)

## 2024-03-14 LAB — RHEUMATOID FACTOR: Rheumatoid fact SerPl-aCnc: <10 [IU]/mL (ref <14)

## 2024-03-14 LAB — TSH: TSH: 1.96 m[IU]/L (ref 0.40–4.50)

## 2024-03-14 LAB — ANA: Anti Nuclear Antibody (ANA): NEGATIVE

## 2024-03-16 DIAGNOSIS — E2740 Unspecified adrenocortical insufficiency: Secondary | ICD-10-CM | POA: Diagnosis not present

## 2024-03-16 DIAGNOSIS — E161 Other hypoglycemia: Secondary | ICD-10-CM | POA: Diagnosis not present

## 2024-03-19 ENCOUNTER — Encounter: Payer: Self-pay | Admitting: Dermatology

## 2024-03-19 ENCOUNTER — Other Ambulatory Visit (HOSPITAL_COMMUNITY): Payer: Self-pay | Admitting: Cardiology

## 2024-03-19 DIAGNOSIS — I2510 Atherosclerotic heart disease of native coronary artery without angina pectoris: Secondary | ICD-10-CM

## 2024-03-19 DIAGNOSIS — I509 Heart failure, unspecified: Secondary | ICD-10-CM

## 2024-03-19 DIAGNOSIS — R002 Palpitations: Secondary | ICD-10-CM

## 2024-03-19 DIAGNOSIS — E2740 Unspecified adrenocortical insufficiency: Secondary | ICD-10-CM | POA: Diagnosis not present

## 2024-03-20 ENCOUNTER — Ambulatory Visit (HOSPITAL_BASED_OUTPATIENT_CLINIC_OR_DEPARTMENT_OTHER)
Admission: RE | Admit: 2024-03-20 | Discharge: 2024-03-20 | Disposition: A | Discharge status: Home / Self Care | Source: Ambulatory Visit | Attending: Family Medicine | Admitting: Family Medicine

## 2024-03-20 DIAGNOSIS — I2510 Atherosclerotic heart disease of native coronary artery without angina pectoris: Secondary | ICD-10-CM | POA: Insufficient documentation

## 2024-03-20 DIAGNOSIS — I509 Heart failure, unspecified: Secondary | ICD-10-CM | POA: Insufficient documentation

## 2024-03-20 DIAGNOSIS — R002 Palpitations: Secondary | ICD-10-CM | POA: Diagnosis not present

## 2024-03-20 LAB — ECHOCARDIOGRAM COMPLETE
AR max vel: 1.62 cm2
AV Area VTI: 1.63 cm2
AV Area mean vel: 1.54 cm2
AV Mean grad: 4 mmHg
AV Peak grad: 6.1 mmHg
Ao pk vel: 1.23 m/s
Area-P 1/2: 6.27 cm2
Calc EF: 60.6 %
S' Lateral: 2.9 cm
Single Plane A2C EF: 66.2 %
Single Plane A4C EF: 56.2 %

## 2024-04-10 ENCOUNTER — Other Ambulatory Visit (HOSPITAL_COMMUNITY): Payer: Self-pay | Admitting: Cardiology

## 2024-04-10 DIAGNOSIS — Z8249 Family history of ischemic heart disease and other diseases of the circulatory system: Secondary | ICD-10-CM

## 2024-04-13 ENCOUNTER — Ambulatory Visit (HOSPITAL_BASED_OUTPATIENT_CLINIC_OR_DEPARTMENT_OTHER)
Admission: RE | Admit: 2024-04-13 | Discharge: 2024-04-13 | Disposition: A | Discharge status: Home / Self Care | Payer: Self-pay | Source: Ambulatory Visit | Attending: Cardiology | Admitting: Cardiology

## 2024-04-13 DIAGNOSIS — E782 Mixed hyperlipidemia: Secondary | ICD-10-CM | POA: Diagnosis not present

## 2024-04-13 DIAGNOSIS — R7309 Other abnormal glucose: Secondary | ICD-10-CM | POA: Diagnosis not present

## 2024-04-13 DIAGNOSIS — Z8249 Family history of ischemic heart disease and other diseases of the circulatory system: Secondary | ICD-10-CM | POA: Insufficient documentation

## 2024-04-13 DIAGNOSIS — I471 Supraventricular tachycardia, unspecified: Secondary | ICD-10-CM | POA: Diagnosis not present

## 2024-04-17 ENCOUNTER — Encounter: Payer: Self-pay | Admitting: Family Medicine

## 2024-04-17 ENCOUNTER — Ambulatory Visit: Admitting: Family Medicine

## 2024-04-17 VITALS — BP 100/66 | HR 71 | Temp 98.2°F | Ht 62.0 in | Wt 139.8 lb

## 2024-04-17 DIAGNOSIS — M79671 Pain in right foot: Secondary | ICD-10-CM

## 2024-04-17 DIAGNOSIS — M79672 Pain in left foot: Secondary | ICD-10-CM

## 2024-04-17 DIAGNOSIS — F32A Depression, unspecified: Secondary | ICD-10-CM

## 2024-04-17 DIAGNOSIS — F419 Anxiety disorder, unspecified: Secondary | ICD-10-CM

## 2024-04-17 DIAGNOSIS — E7800 Pure hypercholesterolemia, unspecified: Secondary | ICD-10-CM

## 2024-04-17 DIAGNOSIS — R931 Abnormal findings on diagnostic imaging of heart and coronary circulation: Secondary | ICD-10-CM

## 2024-04-17 DIAGNOSIS — I471 Supraventricular tachycardia, unspecified: Secondary | ICD-10-CM | POA: Diagnosis not present

## 2024-04-17 DIAGNOSIS — M25649 Stiffness of unspecified hand, not elsewhere classified: Secondary | ICD-10-CM

## 2024-04-17 MED ORDER — DULOXETINE HCL 30 MG PO CPEP: 30.0000 mg | ORAL_CAPSULE | Freq: Every day | ORAL | 1 refills | Status: DC

## 2024-04-17 NOTE — Progress Notes (Signed)
 Patient Office Visit  Assessment & Plan:  Elevated coronary artery calcium score  PSVT (paroxysmal supraventricular tachycardia) (HCC)  Anxiety and depression -     DULoxetine  HCl; Take 1 capsule (30 mg total) by mouth daily.  Dispense: 90 capsule; Refill: 1  Stiffness of hand joint, unspecified laterality -     Ambulatory referral to Rheumatology  Foot pain, bilateral -     Ambulatory referral to Rheumatology  Elevated cholesterol   Assessment and Plan    Coronary artery calcification Moderate calcification with a calcium score of 139. Low 10-year risk of heart event, but statin therapy indicated. - Discuss statin therapy with cardiologist. - Consider starting Crestor if cardiologist agrees.  Supraventricular tachycardia Increased SVT episodes managed with low-dose metoprolol. Symptoms include daily skipped beats and racing heart. - Continue metoprolol at current dose. - Monitor for symptoms of hypotension.  Previous history of Vasovagal syncope Metoprolol may help prevent syncope but could cause lightheadedness. - Continue lifestyle modifications. - Monitor for lightheadedness or syncope.  Reactive hypoglycemia Managed with Acarbose, effective in controlling blood sugar spikes. Endocrinologist supports its use despite cardiologist's advice. - Continue Acarbose as per endocrinologist's recommendation. - Monitor blood glucose levels.  Depression/chronic pain Managed with Cymbalta  30 mg, aiding mood and chronic pain. Mood stable with better glycemic control. Considering tapering post-husband's retirement. - Continue Cymbalta  30 mg. - Consider tapering to 20 mg after husband's retirement transition. - Send prescription for Cymbalta  30mg  to [facility].  Chronic pain in hands and feet Chronic pain, especially in the morning and after inactivity. Symptoms suggest possible rheumatoid arthritis. - Refer to rheumatologist for evaluation. - Consider Voltaren  gel for  symptomatic relief.      Return in about 3 months (around 07/18/2024), or if symptoms worsen or fail to improve.   Subjective:    Patient ID: Lori King, female    DOB: 02-22-1966  Age: 58 y.o. MRN: [id number]  No chief complaint on file.   HPI Discussed the use of AI scribe software for clinical note transcription with the patient, who gave verbal consent to proceed.  History of Present Illness        Lori King is a 58 year old female who presents for a six-month checkup, discuss calcium score CT scan, bilateral hand pain and foot pain.   She recently underwent a calcium CT scan, which revealed a calcium score of 139. There is a family history of heart disease, as her father had similar issues. patient has not discussed this with her cardiologist Dr. Levern.  Patient is concerned about the negative long-term effects of statins i.e. causing memory issues and dress syndrome.  Looks like Lipitor can cause dress syndrome but not Crestor.  States the cardiologist thought it was unusual that bad cholesterol panel 3 months ago the total cholesterol was 134 and last month that went to 220.  States that that July lipid panel was done after she returned from a vacation  Patient worse heart monitor/Ziopatch for one month which showed a higher percentage of time of SVT. Previous Ziopatch done in [place] did not reveal as much. Patient has been more symptomatic. She has a history of supraventricular tachycardia and was started on a low dose of a beta blocker Toprol XL 25 mg two weeks ago. She takes the medication at night to avoid fatigue and reports a reduction in episodes of skipped beats or racing heart since starting the medication. These episodes have never woken her up  at night. Patient thinks the metoprolol is helping.   Last Friday, she experienced a vasovagal syncope episode after bending over. She felt nauseous and had to sit down but stabilized after coughing. She is  aware of the importance of staying hydrated and eating consistently to manage these episodes.  She is currently taking Cymbalta  30mg  once per day for pain management following shoulder surgery. She switched from Wellbutrin to Cymbalta  about six months ago due to concerns from her cardiologist and neurologist. She was not told why she needed to come off the Wellbutrin XL that she had been on for over 10 years. She questions the need for an antidepressant as her mood has stabilized with better blood sugar control and reduced external stressors. Her youngest daughter graduated from college and is teaching. Patient feels like her stressors are much less now.   She reports persistent pain in her hands and feet, which worsens after periods of inactivity. The pain eases after a few minutes of movement. She has a high pain threshold and manages the discomfort but has not yet used Voltaren  gel on her hands and feet. We did autoimmune panel in July which was negative. Patient has not seen Rheumatologist and would like to see one due to ongoing symptoms.   No atrial fibrillation, and no episodes of syncope waking her up at night. She reports sleeping well and has no current issues with her mood. Physical Exam CHEST: Lungs clear to auscultation bilaterally. Results RADIOLOGY Calcium CT scan: Calcium score 139 (04/13/2024)  DIAGNOSTIC Heart monitor: Supraventricular tachycardia Assessment & Plan Coronary artery calcification Moderate calcification with a calcium score of 139. Low 10-year risk of heart event, but statin therapy indicated. - Discuss statin therapy with cardiologist. - Consider starting Crestor if cardiologist agrees.  Supraventricular tachycardia Increased SVT episodes managed with low-dose metoprolol. Symptoms include daily skipped beats and racing heart. - Continue metoprolol at current dose. - Monitor for symptoms of hypotension.  Previous history of Vasovagal syncope Metoprolol may  help prevent syncope but could cause lightheadedness. - Continue lifestyle modifications. - Monitor for lightheadedness or syncope.  Reactive hypoglycemia Managed with Acarbose, effective in controlling blood sugar spikes. Endocrinologist supports its use despite cardiologist's advice. - Continue Acarbose as per endocrinologist's recommendation. - Monitor blood glucose levels.  Depression/chronic pain Managed with Cymbalta  30 mg, aiding mood and chronic pain. Mood stable with better glycemic control. Considering tapering post-husband's retirement. - Continue Cymbalta  30 mg. - Consider tapering to 20 mg after husband's retirement transition. - Send prescription for Cymbalta  30mg  to [facility].  Chronic pain in hands and feet Chronic pain, especially in the morning and after inactivity. Symptoms suggest possible rheumatoid arthritis. - Refer to rheumatologist for evaluation. - Consider Voltaren  gel for symptomatic relief.    The 10-year ASCVD risk score (Arnett DK, et al., 2019) is: 1.3%  Past Medical History:  Diagnosis Date   Adhesive capsulitis of right shoulder    Allergic rhinitis, seasonal    Allergy    Anemia    Anxiety    MDD (major depressive disorder)    Wears contact lenses    Past Surgical History:  Procedure Laterality Date   BREAST BIOPSY Left 2010   x2 per pt benign   CESAREAN SECTION  X2  last one 02-28-2001   W/  BILATERAL TUBAL LIGATION w/ last c/s   CHOLECYSTECTOMY N/A 10/15/2023   Procedure: LAPAROSCOPIC CHOLECYSTECTOMY WITH INTRAOPERATIVE CHOLANGIOGRAM AND ICG DYE;  Surgeon: Tanda Locus, MD;  Location: [facility];  Service: General;  Laterality: N/A;   COLONOSCOPY  11/01/2017   HYSTEROSCOPY W/ ENDOMETRIAL ABLATION  2019   LUMBAR LAMINECTOMY/DECOMPRESSION MICRODISCECTOMY  2013   L4  --- S1   SHOULDER ARTHROSCOPY Right 08/28/2020   Procedure: ARTHROSCOPY SHOULDER lysis of adhesions and manipulation under anesthesia, extensive debridement;  Surgeon:  Sharl Selinda Dover, MD;  Location: [facility];  Service: Orthopedics;  Laterality: Right;   SPINE SURGERY     TUBAL LIGATION     Social History   Tobacco Use   Smoking status: Never    Passive exposure: Never   Smokeless tobacco: Never  Vaping Use   Vaping status: Never Used  Substance Use Topics   Alcohol use: Not Currently   Drug use: Never   Family History  Problem Relation Age of Onset   Breast cancer Mother 76   Atrial fibrillation Mother    Hypertension Mother    Hyperlipidemia Mother    Arthritis Mother    Stroke Father    Heart disease Father    Breast cancer Sister 63   Anxiety disorder Sister    Hypertension Sister    Bipolar disorder Daughter    Rheum arthritis Neg Hx    Lupus Neg Hx    Allergies  Allergen Reactions   Other Anaphylaxis    ALL TYPES OF FISH   Rocephin  [Ceftriaxone ] Rash   Shellfish Allergy Anaphylaxis    ALL SHELLFISH   Escitalopram Other (See Comments)   Cephalosporins Rash   Ciprofloxacin Rash   Doxycycline Itching   Keflex [Cephalexin] Rash   Merrem  [Meropenem ]     Possible rash vs related to prior abx (Rocephin )   Penicillins Rash    Did it involve swelling of the face/tongue/throat, SOB, or low BP? No Did it involve sudden or severe rash/hives, skin peeling, or any reaction on the inside of your mouth or nose? Yes Did you need to seek medical attention at a hospital or doctor's office? No When did it last happen?  Over 30 years ago.     If all above answers are NO, may proceed with cephalosporin use.      ROS    Objective:    BP 100/66   Pulse 71   Temp 98.2 F (36.8 C)   Ht 5' 2 (1.575 m)   Wt 139 lb 12.8 oz (63.4 kg)   SpO2 98%   BMI 25.57 kg/m  BP Readings from Last 3 Encounters:  04/17/24 100/66  03/12/24 118/68  01/18/24 122/68   Wt Readings from Last 3 Encounters:  04/17/24 139 lb 12.8 oz (63.4 kg)  03/12/24 138 lb 2 oz (62.7 kg)  01/18/24 138 lb 2 oz (62.7 kg)    Physical  Exam Vitals and nursing note reviewed.  Constitutional:      Appearance: Normal appearance.  HENT:     Head: Normocephalic.     Right Ear: Tympanic membrane, ear canal and external ear normal.     Left Ear: Tympanic membrane, ear canal and external ear normal.  Eyes:     Extraocular Movements: Extraocular movements intact.     Conjunctiva/sclera: Conjunctivae normal.     Pupils: Pupils are equal, round, and reactive to light.  Cardiovascular:     Rate and Rhythm: Normal rate and regular rhythm.     Heart sounds: Normal heart sounds.  Pulmonary:     Effort: Pulmonary effort is normal.     Breath sounds: Normal breath sounds.  Musculoskeletal:  Right hand: No swelling or deformity. Normal range of motion.     Left hand: Normal. No swelling or deformity. Normal range of motion.     Right lower leg: No edema.     Left lower leg: No edema.     Comments: Patient has good grip bilaterally.   Neurological:     General: No focal deficit present.     Mental Status: She is alert and oriented to person, place, and time. Mental status is at baseline.  Psychiatric:        Mood and Affect: Mood normal.        Behavior: Behavior normal.        Thought Content: Thought content normal.        Judgment: Judgment normal.      No results found for any visits on 04/17/24.

## 2024-04-19 DIAGNOSIS — F419 Anxiety disorder, unspecified: Secondary | ICD-10-CM | POA: Diagnosis not present

## 2024-04-19 DIAGNOSIS — M2550 Pain in unspecified joint: Secondary | ICD-10-CM | POA: Diagnosis not present

## 2024-04-19 DIAGNOSIS — R5383 Other fatigue: Secondary | ICD-10-CM | POA: Diagnosis not present

## 2024-04-19 DIAGNOSIS — A6920 Lyme disease, unspecified: Secondary | ICD-10-CM | POA: Diagnosis not present

## 2024-04-19 DIAGNOSIS — E161 Other hypoglycemia: Secondary | ICD-10-CM | POA: Diagnosis not present

## 2024-05-15 ENCOUNTER — Other Ambulatory Visit: Payer: Self-pay | Admitting: Dermatology

## 2024-05-15 ENCOUNTER — Encounter: Payer: Self-pay | Admitting: Dermatology

## 2024-05-15 ENCOUNTER — Ambulatory Visit: Admitting: Dermatology

## 2024-05-15 VITALS — BP 99/63 | HR 72

## 2024-05-15 DIAGNOSIS — L601 Onycholysis: Secondary | ICD-10-CM | POA: Diagnosis not present

## 2024-05-15 DIAGNOSIS — L729 Follicular cyst of the skin and subcutaneous tissue, unspecified: Secondary | ICD-10-CM

## 2024-05-15 DIAGNOSIS — Z872 Personal history of diseases of the skin and subcutaneous tissue: Secondary | ICD-10-CM | POA: Diagnosis not present

## 2024-05-15 DIAGNOSIS — B351 Tinea unguium: Secondary | ICD-10-CM | POA: Diagnosis not present

## 2024-05-15 NOTE — Progress Notes (Signed)
 Follow-Up Visit   Subjective  Lori King is a 58 y.o. female who presents for the following: Dress Syndrome, Toe nail fungus and cyst  Patient present today for follow up visit for Rash (DRESS Syndrome). Patient was last evaluated on 11/15/23. At this visit patient was prescribed clobetasol  cream to apply 2 times daily for 2 weeks then STOP. Patient reports sxs are improved. Patient reports she has had some heart issues. She reports in May while in [place] she passed out there. When she returned she was evaluated by Cardiology and was advised she had SVT. She was placed on Metoprolol and Crestor for cholesterol. Patient denies medication changes.  Patient also has toe nail concern for fungus and a small cyst on her left flank she would like to have examined. Neither areas is problematic, she just would like them evaluated while in office.   The following portions of the chart were reviewed this encounter and updated as appropriate: medications, allergies, medical history  Review of Systems:  No other skin or systemic complaints except as noted in HPI or Assessment and Plan.  Objective  Well appearing patient in no apparent distress; mood and affect are within normal limits.  A full examination was performed including scalp, head, eyes, ears, nose, lips, neck, chest, axillae, abdomen, back, buttocks, bilateral upper extremities, bilateral lower extremities, hands, feet, fingers, toes, fingernails, and toenails. All findings within normal limits unless otherwise noted below.   Relevant exam findings are noted in the Assessment and Plan.         Assessment & Plan   1. Onycholysis (Suspected Psoriasis vs Onychomycosis) - Assessment: Patient reports suspected fungal infection in right big toenail following trauma six months ago. Physical examination reveals onycholysis and crumbly appearance classic for fungal infection. Given patient's family history of psoriasis and current joint pain,  psoriasis affecting the nail is also a consideration. Nail clipping obtained for fungal culture to differentiate between conditions. - Plan:    Obtain nail clipping for fungal culture    If culture positive for fungus, prescribe Jublia topical solution    Educate patient on using separate nail clippers for affected nails and cleaning with alcohol    If culture negative, consider psoriasis as potential cause    Follow up as needed based on culture results  2. Asymptomatic Cyst - Assessment: Small cyst on left side previously drained by patient but has refilled. Currently asymptomatic with no immediate intervention required. - Plan:    Observe cyst with no immediate intervention required    Discuss option for surgical removal if cyst becomes problematic    Consider punch excision if patient opts for removal in future  3. 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 Condition currently resolved with no active skin lesions. Patient expresses concern about potential relationship between DRESS syndrome and current heart issues. - Plan:    Educate patient to inform all healthcare providers about Rocephin  allergy and associated systemic reaction    Advise caution with supplements and recommend trusted brands like Solgar    Continue follow-up with functional medicine for management of other conditions  Follow-up as needed based on culture results and patient concerns.   ONYCHOMYCOSIS   Related Procedures Culture, Fungus without Smear  Return in about 6 months (around 11/12/2024) for TBSE.  I, Jetta Ager, am acting as Neurosurgeon for [employer], DO.  Documentation: I have reviewed the above documentation for accuracy and completeness, and I  agree with the above.  Delon Lenis, DO

## 2024-05-15 NOTE — Patient Instructions (Signed)

## 2024-05-16 LAB — SPECIMEN STATUS REPORT

## 2024-05-17 ENCOUNTER — Ambulatory Visit: Payer: Self-pay | Admitting: Dermatology

## 2024-05-17 NOTE — Progress Notes (Signed)
 Lori King,   We saw this pt together.  It states that the date of collection was not on the specimen so it was not tested.  Can you please call, verify the data and see if they still have the specimen for testing?  Thanks!

## 2024-05-18 ENCOUNTER — Encounter: Payer: Self-pay | Admitting: Dermatology

## 2024-05-24 ENCOUNTER — Encounter: Payer: Self-pay | Admitting: Dermatology

## 2024-05-24 NOTE — Telephone Encounter (Signed)
 Hi Jetta,  I sent you a communication on 9/18 about the culture message from the lab.   Any updates??   _Dr Alm

## 2024-05-30 ENCOUNTER — Ambulatory Visit: Payer: Self-pay | Admitting: Dermatology

## 2024-05-30 LAB — SPECIMEN STATUS REPORT

## 2024-05-30 LAB — FUNGUS CULTURE, BLOOD

## 2024-05-30 NOTE — Progress Notes (Signed)
 Hi Jetta,  The patient's fungal culture was positive so please send over an rx of Jublia to [facility].  The patient is aware of the results but you will need to call or message her to inform her of which pharmacy the Jublia was sent to.   Thanks!

## 2024-05-31 ENCOUNTER — Other Ambulatory Visit: Payer: Self-pay

## 2024-05-31 DIAGNOSIS — B351 Tinea unguium: Secondary | ICD-10-CM

## 2024-05-31 MED ORDER — JUBLIA 10 % EX SOLN: 1.0000 | Freq: Every evening | CUTANEOUS | 11 refills | Status: AC

## 2024-06-07 DIAGNOSIS — I471 Supraventricular tachycardia, unspecified: Secondary | ICD-10-CM | POA: Diagnosis not present

## 2024-06-07 DIAGNOSIS — R7303 Prediabetes: Secondary | ICD-10-CM | POA: Diagnosis not present

## 2024-06-07 DIAGNOSIS — E785 Hyperlipidemia, unspecified: Secondary | ICD-10-CM | POA: Diagnosis not present

## 2024-07-11 ENCOUNTER — Ambulatory Visit: Admitting: Family Medicine

## 2024-07-16 ENCOUNTER — Encounter: Payer: Self-pay | Admitting: Family Medicine

## 2024-07-17 DIAGNOSIS — M542 Cervicalgia: Secondary | ICD-10-CM | POA: Diagnosis not present

## 2024-07-17 DIAGNOSIS — M7711 Lateral epicondylitis, right elbow: Secondary | ICD-10-CM | POA: Diagnosis not present

## 2024-07-18 ENCOUNTER — Ambulatory Visit: Admitting: Family Medicine

## 2024-07-18 ENCOUNTER — Other Ambulatory Visit: Payer: Self-pay | Admitting: Obstetrics and Gynecology

## 2024-07-18 DIAGNOSIS — Z1231 Encounter for screening mammogram for malignant neoplasm of breast: Secondary | ICD-10-CM

## 2024-07-23 ENCOUNTER — Ambulatory Visit: Admitting: Family Medicine

## 2024-07-31 ENCOUNTER — Encounter: Payer: Self-pay | Admitting: Family Medicine

## 2024-07-31 ENCOUNTER — Ambulatory Visit: Admitting: Family Medicine

## 2024-07-31 VITALS — BP 122/62 | HR 66 | Temp 98.4°F | Ht 62.0 in | Wt 146.0 lb

## 2024-07-31 DIAGNOSIS — F419 Anxiety disorder, unspecified: Secondary | ICD-10-CM

## 2024-07-31 DIAGNOSIS — E663 Overweight: Secondary | ICD-10-CM

## 2024-07-31 DIAGNOSIS — Z6826 Body mass index (BMI) 26.0-26.9, adult: Secondary | ICD-10-CM | POA: Diagnosis not present

## 2024-07-31 DIAGNOSIS — E161 Other hypoglycemia: Secondary | ICD-10-CM | POA: Diagnosis not present

## 2024-07-31 DIAGNOSIS — R635 Abnormal weight gain: Secondary | ICD-10-CM | POA: Diagnosis not present

## 2024-07-31 DIAGNOSIS — E785 Hyperlipidemia, unspecified: Secondary | ICD-10-CM

## 2024-07-31 MED ORDER — EPINEPHRINE 0.3 MG/0.3ML IJ SOAJ: 0.3000 mg | Freq: Once | INTRAMUSCULAR | 5 refills | Status: DC | PRN

## 2024-07-31 MED ORDER — DULOXETINE HCL 30 MG PO CPEP: 30.0000 mg | ORAL_CAPSULE | Freq: Every day | ORAL | 1 refills | Status: DC

## 2024-07-31 NOTE — Progress Notes (Unsigned)
 Patient Office Visit  Assessment & Plan:  Overweight (BMI 25.0-29.9) -     TSH  Hyperlipidemia, unspecified hyperlipidemia type  Reactive hypoglycemia  Anxiety and depression -     DULoxetine  HCl; Take 1 capsule (30 mg total) by mouth daily.  Dispense: 90 capsule; Refill: 1  Weight gain -     TSH  Other orders -     EPINEPHrine ; Inject 0.3 mg into the muscle once as needed for up to 1 dose (for severe allergic reaction). CAll 911 immediately if you have to use this medicine  Dispense: 1 each; Refill: 5   Assessment and Plan    Abnormal weight gain and overweight in the context of reactive hypoglycemia Weight gain persists despite increased activity and dietary management. Reactive hypoglycemia complicates weight management. GLP-1 agonists contraindicated. Qsymia considered but cardiac effects concerning due to SVT history. Orlistat not preferred due to gastrointestinal side effects. Cymbalta  may contribute to weight gain, Wellbutrin does not. - Continue current exercise regimen. - Consider Qsymia with cardiologist consultation. - Explore healthy weight program at [facility] or [facility]. - Monitor weight and dietary intake.  Reactive hypoglycemia Episodes post-exercise. Anxiety and depression linked to blood sugar fluctuations. Cymbalta  may stabilize mood. GLP-1 agonists not recommended. - Continue Cymbalta  for mood stabilization. - Monitor blood sugar levels closely, especially post-exercise. - Consult endocrinologist for management.  Hyperlipidemia Cholesterol improved with Crestor. LDL decreased from 137 to 73. Tolerating well. - Continue Crestor as prescribed.  Paroxysmal supraventricular tachycardia (SVT) SVT managed with metoprolol. Blood pressure and pulse controlled. - Continue metoprolol 25 mg daily.  Generalized anxiety disorder and depression Anxiety and depression linked to blood sugar fluctuations. Cymbalta  effective. Anxiety improves with stable blood sugar. -  Continue Cymbalta  for anxiety and depression management. - Monitor mood and anxiety levels.  Hand and foot pain, evaluation for possible inflammatory arthritis Pain possibly related to inflammatory arthritis. Rheumatoid factor negative, RF-negative rheumatoid arthritis possible. Cortisone injection for tennis elbow provided relief. - Follow up with rheumatologist in January.  General Health Maintenance Routine health maintenance discussed. Thyroid  function to be re-evaluated. - Ordered thyroid  function test. - Ensured adequate supply of Cymbalta  and Epipens.      Recommend healthy diet i.e mediterranean/DASH diet, consistent exercise - 30 minutes 5 day per week, and gradual weight loss.  Labs that were done in September on her iPhone.  TSH ordered today.  Return in 3 months or sooner if necessary.  If her endocrinologist believes the GLP-1's would be okay to take with her history of reactive hypoglycemia we can try this cautiously in the future  Return in about 3 months (around 10/29/2024), or if symptoms worsen or fail to improve, for depression.   Subjective:    Patient ID: Lori King, female    DOB: May 25, 1966  Age: 58 y.o. MRN: [id number]  Chief Complaint  Patient presents with   Medical Management of Chronic Issues    HPI Discussed the use of AI scribe software for clinical note transcription with the patient, who gave verbal consent to proceed.  History of Present Illness        History of Present Illness Lori King is a 58 year old female with reactive hypoglycemia and hyperlipidemia who presents with concerns about weight gain and medication management.  She has experienced weight gain despite maintaining a regular exercise routine, which includes walking five days a week for 30 minutes to an hour and riding bikes on the beach. Her exercise has increased,  yet her weight has also increased. She is unsure if the weight gain is related to muscle mass or  other factors.  She has been on Crestor since August, which has improved her cholesterol levels significantly, with her total cholesterol decreasing from 220 in July to 154 in October. Her LDL cholesterol also decreased from 137 to 73. She is tolerating Crestor well but has noticed weight gain, which she does not attribute solely to Thanksgiving.  She has a history of reactive hypoglycemia, which she manages by eating frequently to maintain stable blood sugar levels. She experiences anxiety and depression related to her blood sugar levels, noting that when her blood sugar is stable, her anxiety and depression are better controlled.  He does think overall she is getting a better handle on managing the reactive hypoglycemia.  Turns out her 2 daughters also have this issue.  She is currently taking Cymbalta  30 mg and has discontinued Wellbutrin without noticing a difference in her mood.  Does not think she needs to increase the Cymbalta  at this time.  We did discuss the potential of Cymbalta  contributing to some of the weight gain.  We typically see this at higher dosages.  She is on metoprolol 25 mg for supraventricular tachycardia (SVT). She has not identified a pattern for when the SVT occurs. Her blood pressure is generally stable, though she occasionally experiences low blood pressure when ill.  She mentions a recent cortisone shot for tennis elbow, which temporarily increased her blood sugar to 220, but it returned to normal quickly. She has a history of steroid use earlier in the year, which she suspects may have contributed to her weight gain.  She is scheduled to see a rheumatologist in January for joint pain in her hands and feet. She has a history of negative rheumatoid factor tests.  Results LABS Cholesterol: 154 mg/dL (89/90/7974) [facility] LDL: 73 mg/dL (89/90/7974)  Assessment and Plan Abnormal weight gain and overweight in the context of reactive hypoglycemia Weight gain persists  despite increased activity and dietary management. Reactive hypoglycemia complicates weight management. GLP-1 agonists contraindicated. Qsymia considered but cardiac effects concerning due to SVT history. Orlistat not preferred due to gastrointestinal side effects. Cymbalta  may contribute to weight gain, Wellbutrin does not. - Continue current exercise regimen. - Consider Qsymia with cardiologist consultation. - Explore healthy weight program at [facility] or [facility]. - Monitor weight and dietary intake.  Reactive hypoglycemia Episodes post-exercise. Anxiety and depression linked to blood sugar fluctuations. Cymbalta  may stabilize mood. GLP-1 agonists not recommended. - Continue Cymbalta  for mood stabilization. - Monitor blood sugar levels closely, especially post-exercise. - Consult endocrinologist for management.  Hyperlipidemia Cholesterol improved with Crestor. LDL decreased from 137 to 73. Tolerating well. - Continue Crestor as prescribed.  She was reviewed with patient today from labs that were done at [facility].  Paroxysmal supraventricular tachycardia (SVT) SVT managed with metoprolol. Blood pressure and pulse controlled. - Continue metoprolol 25 mg daily.  Generalized anxiety disorder and depression Anxiety and depression linked to blood sugar fluctuations. Cymbalta  effective. Anxiety improves with stable blood sugar. - Continue Cymbalta  for anxiety and depression management. - Monitor mood and anxiety levels.  Bilateral hand pain, evaluation for possible inflammatory arthritis vs RF negative RA Pain possibly related to inflammatory arthritis. Rheumatoid factor negative, RF-negative rheumatoid arthritis possible. Cortisone injection for tennis elbow provided relief. - Follow up with rheumatologist in January in [facility]  General Health Maintenance Routine health maintenance discussed. Thyroid  function to be re-evaluated. - Ordered thyroid  function test. -  Ensured adequate supply of Cymbalta   and Epipens.    The 10-year ASCVD risk score (Arnett DK, et al., 2019) is: 1.9%  Past Medical History:  Diagnosis Date   Adhesive capsulitis of right shoulder    Allergic rhinitis, seasonal    Allergy    Anemia    Anxiety    MDD (major depressive disorder)    Wears contact lenses    Past Surgical History:  Procedure Laterality Date   BREAST BIOPSY Left 2010   x2 per pt benign   CESAREAN SECTION  X2  last one 02-28-2001   W/  BILATERAL TUBAL LIGATION w/ last c/s   CHOLECYSTECTOMY N/A 10/15/2023   Procedure: LAPAROSCOPIC CHOLECYSTECTOMY WITH INTRAOPERATIVE CHOLANGIOGRAM AND ICG DYE;  Surgeon: Tanda Locus, MD;  Location: [facility];  Service: General;  Laterality: N/A;   COLONOSCOPY  11/01/2017   HYSTEROSCOPY W/ ENDOMETRIAL ABLATION  2019   LUMBAR LAMINECTOMY/DECOMPRESSION MICRODISCECTOMY  2013   L4  --- S1   SHOULDER ARTHROSCOPY Right 08/28/2020   Procedure: ARTHROSCOPY SHOULDER lysis of adhesions and manipulation under anesthesia, extensive debridement;  Surgeon: Sharl Selinda Dover, MD;  Location: [facility];  Service: Orthopedics;  Laterality: Right;   SPINE SURGERY     TUBAL LIGATION     Social History   Tobacco Use   Smoking status: Never    Passive exposure: Never   Smokeless tobacco: Never  Vaping Use   Vaping status: Never Used  Substance Use Topics   Alcohol use: Not Currently   Drug use: Never   Family History  Problem Relation Age of Onset   Breast cancer Mother 25   Atrial fibrillation Mother    Hypertension Mother    Hyperlipidemia Mother    Arthritis Mother    Stroke Father    Heart disease Father    Breast cancer Sister 33   Anxiety disorder Sister    Hypertension Sister    Bipolar disorder Daughter    Rheum arthritis Neg Hx    Lupus Neg Hx    Allergies  Allergen Reactions   Other Anaphylaxis    ALL TYPES OF FISH   Rocephin  [Ceftriaxone ] Rash   Shellfish Allergy Anaphylaxis    ALL SHELLFISH   Escitalopram Other (See  Comments)   Cephalosporins Rash   Ciprofloxacin Rash   Doxycycline Itching   Keflex [Cephalexin] Rash   Merrem  [Meropenem ]     Possible rash vs related to prior abx (Rocephin )   Penicillins Rash    Did it involve swelling of the face/tongue/throat, SOB, or low BP? No Did it involve sudden or severe rash/hives, skin peeling, or any reaction on the inside of your mouth or nose? Yes Did you need to seek medical attention at a hospital or doctor's office? No When did it last happen?  Over 30 years ago.     If all above answers are NO, may proceed with cephalosporin use.      ROS    Objective:    BP 122/62   Pulse 66   Temp 98.4 F (36.9 C)   Ht 5' 2 (1.575 m)   Wt 146 lb (66.2 kg)   SpO2 99%   BMI 26.70 kg/m  BP Readings from Last 3 Encounters:  07/31/24 122/62  05/15/24 99/63  04/17/24 100/66   Wt Readings from Last 3 Encounters:  07/31/24 146 lb (66.2 kg)  04/17/24 139 lb 12.8 oz (63.4 kg)  03/12/24 138 lb 2 oz (62.7 kg)    Physical Exam  Vitals and nursing note reviewed.  Constitutional:      General: She is not in acute distress.    Appearance: Normal appearance.  HENT:     Head: Normocephalic.     Right Ear: Tympanic membrane, ear canal and external ear normal.     Left Ear: Tympanic membrane, ear canal and external ear normal.  Eyes:     Extraocular Movements: Extraocular movements intact.     Conjunctiva/sclera: Conjunctivae normal.     Pupils: Pupils are equal, round, and reactive to light.  Cardiovascular:     Rate and Rhythm: Normal rate and regular rhythm.     Heart sounds: Normal heart sounds.  Pulmonary:     Effort: Pulmonary effort is normal.     Breath sounds: Normal breath sounds. No wheezing.  Musculoskeletal:     Right lower leg: No edema.     Left lower leg: No edema.  Neurological:     General: No focal deficit present.     Mental Status: She is alert and oriented to person, place, and time.  Psychiatric:        Mood and Affect:  Mood normal.        Behavior: Behavior normal.      Results for orders placed or performed in visit on 07/31/24  TSH  Result Value Ref Range   TSH 1.20 0.40 - 4.50 mIU/L

## 2024-08-01 ENCOUNTER — Ambulatory Visit: Payer: Self-pay | Admitting: Family Medicine

## 2024-08-01 LAB — TSH: TSH: 1.2 m[IU]/L (ref 0.40–4.50)

## 2024-08-07 ENCOUNTER — Other Ambulatory Visit: Payer: Self-pay

## 2024-08-07 ENCOUNTER — Encounter: Payer: Self-pay | Admitting: Family Medicine

## 2024-08-07 DIAGNOSIS — F419 Anxiety disorder, unspecified: Secondary | ICD-10-CM

## 2024-08-07 MED ORDER — EPINEPHRINE 0.3 MG/0.3ML IJ SOAJ: 0.3000 mg | Freq: Once | INTRAMUSCULAR | 5 refills | Status: AC | PRN

## 2024-08-07 MED ORDER — DULOXETINE HCL 30 MG PO CPEP: 30.0000 mg | ORAL_CAPSULE | Freq: Every day | ORAL | 1 refills | Status: AC

## 2024-08-24 ENCOUNTER — Ambulatory Visit: Admitting: Family Medicine

## 2024-09-14 ENCOUNTER — Ambulatory Visit

## 2024-09-26 ENCOUNTER — Ambulatory Visit
Admission: RE | Admit: 2024-09-26 | Discharge: 2024-09-26 | Disposition: A | Source: Ambulatory Visit | Attending: Obstetrics and Gynecology | Admitting: Obstetrics and Gynecology

## 2024-09-26 DIAGNOSIS — Z1231 Encounter for screening mammogram for malignant neoplasm of breast: Secondary | ICD-10-CM

## 2024-11-13 ENCOUNTER — Ambulatory Visit: Admitting: Dermatology

## 2024-11-13 ENCOUNTER — Ambulatory Visit: Admitting: Family Medicine
